# Patient Record
Sex: Female | Born: 1975 | Race: Black or African American | Hispanic: No | Marital: Married | Smoking: Never smoker
Health system: Southern US, Community
[De-identification: ages and names within clinical notes are randomized; demographics above are authoritative.]

## PROBLEM LIST (undated history)

## (undated) DIAGNOSIS — I671 Cerebral aneurysm, nonruptured: Secondary | ICD-10-CM

## (undated) DIAGNOSIS — J45909 Unspecified asthma, uncomplicated: Secondary | ICD-10-CM

## (undated) DIAGNOSIS — M1990 Unspecified osteoarthritis, unspecified site: Secondary | ICD-10-CM

## (undated) DIAGNOSIS — E785 Hyperlipidemia, unspecified: Secondary | ICD-10-CM

## (undated) DIAGNOSIS — M810 Age-related osteoporosis without current pathological fracture: Secondary | ICD-10-CM

## (undated) DIAGNOSIS — E559 Vitamin D deficiency, unspecified: Secondary | ICD-10-CM

## (undated) DIAGNOSIS — M2550 Pain in unspecified joint: Secondary | ICD-10-CM

## (undated) DIAGNOSIS — I639 Cerebral infarction, unspecified: Secondary | ICD-10-CM

## (undated) DIAGNOSIS — I10 Essential (primary) hypertension: Secondary | ICD-10-CM

## (undated) DIAGNOSIS — R0602 Shortness of breath: Secondary | ICD-10-CM

## (undated) HISTORY — PX: DILATION AND CURETTAGE OF UTERUS: SHX78

## (undated) HISTORY — DX: Shortness of breath: R06.02

## (undated) HISTORY — DX: Hyperlipidemia, unspecified: E78.5

## (undated) HISTORY — DX: Cerebral aneurysm, nonruptured: I67.1

## (undated) HISTORY — DX: Age-related osteoporosis without current pathological fracture: M81.0

## (undated) HISTORY — DX: Cerebral infarction, unspecified: I63.9

## (undated) HISTORY — DX: Pain in unspecified joint: M25.50

## (undated) HISTORY — DX: Vitamin D deficiency, unspecified: E55.9

## (undated) HISTORY — DX: Essential (primary) hypertension: I10

---

## 2016-01-15 DIAGNOSIS — J069 Acute upper respiratory infection, unspecified: Secondary | ICD-10-CM | POA: Diagnosis not present

## 2016-08-30 DIAGNOSIS — J189 Pneumonia, unspecified organism: Secondary | ICD-10-CM | POA: Diagnosis not present

## 2016-09-21 DIAGNOSIS — Z0000 Encounter for general adult medical examination without abnormal findings: Secondary | ICD-10-CM | POA: Diagnosis not present

## 2016-09-28 DIAGNOSIS — M25561 Pain in right knee: Secondary | ICD-10-CM | POA: Diagnosis not present

## 2016-09-28 DIAGNOSIS — M25562 Pain in left knee: Secondary | ICD-10-CM | POA: Diagnosis not present

## 2016-09-28 DIAGNOSIS — R635 Abnormal weight gain: Secondary | ICD-10-CM | POA: Diagnosis not present

## 2016-09-28 DIAGNOSIS — Z1389 Encounter for screening for other disorder: Secondary | ICD-10-CM | POA: Diagnosis not present

## 2016-09-28 DIAGNOSIS — Z0000 Encounter for general adult medical examination without abnormal findings: Secondary | ICD-10-CM | POA: Diagnosis not present

## 2016-09-28 DIAGNOSIS — J45998 Other asthma: Secondary | ICD-10-CM | POA: Diagnosis not present

## 2016-10-29 DIAGNOSIS — M25562 Pain in left knee: Secondary | ICD-10-CM | POA: Diagnosis not present

## 2016-10-29 DIAGNOSIS — M170 Bilateral primary osteoarthritis of knee: Secondary | ICD-10-CM | POA: Diagnosis not present

## 2016-10-29 DIAGNOSIS — M25561 Pain in right knee: Secondary | ICD-10-CM | POA: Diagnosis not present

## 2016-11-07 DIAGNOSIS — M170 Bilateral primary osteoarthritis of knee: Secondary | ICD-10-CM | POA: Diagnosis not present

## 2016-11-20 DIAGNOSIS — M170 Bilateral primary osteoarthritis of knee: Secondary | ICD-10-CM | POA: Diagnosis not present

## 2016-11-29 DIAGNOSIS — M170 Bilateral primary osteoarthritis of knee: Secondary | ICD-10-CM | POA: Diagnosis not present

## 2016-12-20 DIAGNOSIS — R635 Abnormal weight gain: Secondary | ICD-10-CM | POA: Diagnosis not present

## 2016-12-20 DIAGNOSIS — N951 Menopausal and female climacteric states: Secondary | ICD-10-CM | POA: Diagnosis not present

## 2016-12-26 DIAGNOSIS — E559 Vitamin D deficiency, unspecified: Secondary | ICD-10-CM | POA: Diagnosis not present

## 2017-01-02 DIAGNOSIS — E559 Vitamin D deficiency, unspecified: Secondary | ICD-10-CM | POA: Diagnosis not present

## 2017-01-14 DIAGNOSIS — I10 Essential (primary) hypertension: Secondary | ICD-10-CM | POA: Diagnosis not present

## 2017-01-14 DIAGNOSIS — E559 Vitamin D deficiency, unspecified: Secondary | ICD-10-CM | POA: Diagnosis not present

## 2017-01-28 DIAGNOSIS — E039 Hypothyroidism, unspecified: Secondary | ICD-10-CM | POA: Diagnosis not present

## 2017-01-28 DIAGNOSIS — I10 Essential (primary) hypertension: Secondary | ICD-10-CM | POA: Diagnosis not present

## 2017-01-28 DIAGNOSIS — E559 Vitamin D deficiency, unspecified: Secondary | ICD-10-CM | POA: Diagnosis not present

## 2017-02-04 DIAGNOSIS — E039 Hypothyroidism, unspecified: Secondary | ICD-10-CM | POA: Diagnosis not present

## 2017-02-04 DIAGNOSIS — E559 Vitamin D deficiency, unspecified: Secondary | ICD-10-CM | POA: Diagnosis not present

## 2017-02-12 DIAGNOSIS — E039 Hypothyroidism, unspecified: Secondary | ICD-10-CM | POA: Diagnosis not present

## 2017-02-12 DIAGNOSIS — E559 Vitamin D deficiency, unspecified: Secondary | ICD-10-CM | POA: Diagnosis not present

## 2017-02-18 DIAGNOSIS — E559 Vitamin D deficiency, unspecified: Secondary | ICD-10-CM | POA: Diagnosis not present

## 2017-02-18 DIAGNOSIS — E039 Hypothyroidism, unspecified: Secondary | ICD-10-CM | POA: Diagnosis not present

## 2017-02-18 DIAGNOSIS — I10 Essential (primary) hypertension: Secondary | ICD-10-CM | POA: Diagnosis not present

## 2017-02-25 DIAGNOSIS — E039 Hypothyroidism, unspecified: Secondary | ICD-10-CM | POA: Diagnosis not present

## 2017-02-25 DIAGNOSIS — I10 Essential (primary) hypertension: Secondary | ICD-10-CM | POA: Diagnosis not present

## 2017-03-04 DIAGNOSIS — I10 Essential (primary) hypertension: Secondary | ICD-10-CM | POA: Diagnosis not present

## 2017-03-04 DIAGNOSIS — E039 Hypothyroidism, unspecified: Secondary | ICD-10-CM | POA: Diagnosis not present

## 2017-03-11 DIAGNOSIS — R635 Abnormal weight gain: Secondary | ICD-10-CM | POA: Diagnosis not present

## 2017-03-11 DIAGNOSIS — E559 Vitamin D deficiency, unspecified: Secondary | ICD-10-CM | POA: Diagnosis not present

## 2017-03-11 DIAGNOSIS — E039 Hypothyroidism, unspecified: Secondary | ICD-10-CM | POA: Diagnosis not present

## 2017-03-19 DIAGNOSIS — E559 Vitamin D deficiency, unspecified: Secondary | ICD-10-CM | POA: Diagnosis not present

## 2017-03-19 DIAGNOSIS — E039 Hypothyroidism, unspecified: Secondary | ICD-10-CM | POA: Diagnosis not present

## 2017-03-25 DIAGNOSIS — E559 Vitamin D deficiency, unspecified: Secondary | ICD-10-CM | POA: Diagnosis not present

## 2017-03-25 DIAGNOSIS — E039 Hypothyroidism, unspecified: Secondary | ICD-10-CM | POA: Diagnosis not present

## 2017-04-01 DIAGNOSIS — E039 Hypothyroidism, unspecified: Secondary | ICD-10-CM | POA: Diagnosis not present

## 2017-04-15 DIAGNOSIS — I10 Essential (primary) hypertension: Secondary | ICD-10-CM | POA: Diagnosis not present

## 2017-04-15 DIAGNOSIS — E669 Obesity, unspecified: Secondary | ICD-10-CM | POA: Diagnosis not present

## 2017-04-15 DIAGNOSIS — E559 Vitamin D deficiency, unspecified: Secondary | ICD-10-CM | POA: Diagnosis not present

## 2017-04-15 DIAGNOSIS — E039 Hypothyroidism, unspecified: Secondary | ICD-10-CM | POA: Diagnosis not present

## 2017-04-29 DIAGNOSIS — E669 Obesity, unspecified: Secondary | ICD-10-CM | POA: Diagnosis not present

## 2017-04-29 DIAGNOSIS — E039 Hypothyroidism, unspecified: Secondary | ICD-10-CM | POA: Diagnosis not present

## 2017-04-30 DIAGNOSIS — F4323 Adjustment disorder with mixed anxiety and depressed mood: Secondary | ICD-10-CM | POA: Diagnosis not present

## 2017-05-13 DIAGNOSIS — E559 Vitamin D deficiency, unspecified: Secondary | ICD-10-CM | POA: Diagnosis not present

## 2017-05-13 DIAGNOSIS — E669 Obesity, unspecified: Secondary | ICD-10-CM | POA: Diagnosis not present

## 2017-05-13 DIAGNOSIS — I10 Essential (primary) hypertension: Secondary | ICD-10-CM | POA: Diagnosis not present

## 2017-05-27 DIAGNOSIS — E669 Obesity, unspecified: Secondary | ICD-10-CM | POA: Diagnosis not present

## 2017-05-27 DIAGNOSIS — E039 Hypothyroidism, unspecified: Secondary | ICD-10-CM | POA: Diagnosis not present

## 2017-06-26 DIAGNOSIS — E559 Vitamin D deficiency, unspecified: Secondary | ICD-10-CM | POA: Diagnosis not present

## 2017-06-26 DIAGNOSIS — E039 Hypothyroidism, unspecified: Secondary | ICD-10-CM | POA: Diagnosis not present

## 2017-06-26 DIAGNOSIS — E669 Obesity, unspecified: Secondary | ICD-10-CM | POA: Diagnosis not present

## 2017-07-12 DIAGNOSIS — E559 Vitamin D deficiency, unspecified: Secondary | ICD-10-CM | POA: Diagnosis not present

## 2017-07-12 DIAGNOSIS — E669 Obesity, unspecified: Secondary | ICD-10-CM | POA: Diagnosis not present

## 2017-07-23 DIAGNOSIS — E669 Obesity, unspecified: Secondary | ICD-10-CM | POA: Diagnosis not present

## 2017-07-23 DIAGNOSIS — E559 Vitamin D deficiency, unspecified: Secondary | ICD-10-CM | POA: Diagnosis not present

## 2017-08-06 DIAGNOSIS — Z713 Dietary counseling and surveillance: Secondary | ICD-10-CM | POA: Diagnosis not present

## 2017-08-06 DIAGNOSIS — E669 Obesity, unspecified: Secondary | ICD-10-CM | POA: Diagnosis not present

## 2017-09-02 DIAGNOSIS — E669 Obesity, unspecified: Secondary | ICD-10-CM | POA: Diagnosis not present

## 2017-09-02 DIAGNOSIS — E559 Vitamin D deficiency, unspecified: Secondary | ICD-10-CM | POA: Diagnosis not present

## 2017-09-02 DIAGNOSIS — E039 Hypothyroidism, unspecified: Secondary | ICD-10-CM | POA: Diagnosis not present

## 2017-09-02 DIAGNOSIS — I10 Essential (primary) hypertension: Secondary | ICD-10-CM | POA: Diagnosis not present

## 2017-09-16 DIAGNOSIS — E559 Vitamin D deficiency, unspecified: Secondary | ICD-10-CM | POA: Diagnosis not present

## 2017-09-16 DIAGNOSIS — I10 Essential (primary) hypertension: Secondary | ICD-10-CM | POA: Diagnosis not present

## 2017-09-30 DIAGNOSIS — E559 Vitamin D deficiency, unspecified: Secondary | ICD-10-CM | POA: Diagnosis not present

## 2017-10-14 DIAGNOSIS — R635 Abnormal weight gain: Secondary | ICD-10-CM | POA: Diagnosis not present

## 2017-10-16 DIAGNOSIS — Z01419 Encounter for gynecological examination (general) (routine) without abnormal findings: Secondary | ICD-10-CM | POA: Diagnosis not present

## 2017-10-16 DIAGNOSIS — Z6835 Body mass index (BMI) 35.0-35.9, adult: Secondary | ICD-10-CM | POA: Diagnosis not present

## 2017-10-16 DIAGNOSIS — Z1151 Encounter for screening for human papillomavirus (HPV): Secondary | ICD-10-CM | POA: Diagnosis not present

## 2017-10-28 DIAGNOSIS — I10 Essential (primary) hypertension: Secondary | ICD-10-CM | POA: Diagnosis not present

## 2017-11-11 DIAGNOSIS — R748 Abnormal levels of other serum enzymes: Secondary | ICD-10-CM | POA: Diagnosis not present

## 2017-11-28 DIAGNOSIS — R748 Abnormal levels of other serum enzymes: Secondary | ICD-10-CM | POA: Diagnosis not present

## 2017-11-28 DIAGNOSIS — E559 Vitamin D deficiency, unspecified: Secondary | ICD-10-CM | POA: Diagnosis not present

## 2017-11-28 DIAGNOSIS — I10 Essential (primary) hypertension: Secondary | ICD-10-CM | POA: Diagnosis not present

## 2017-12-11 DIAGNOSIS — R748 Abnormal levels of other serum enzymes: Secondary | ICD-10-CM | POA: Diagnosis not present

## 2017-12-11 DIAGNOSIS — E669 Obesity, unspecified: Secondary | ICD-10-CM | POA: Diagnosis not present

## 2017-12-11 DIAGNOSIS — E559 Vitamin D deficiency, unspecified: Secondary | ICD-10-CM | POA: Diagnosis not present

## 2017-12-24 DIAGNOSIS — E559 Vitamin D deficiency, unspecified: Secondary | ICD-10-CM | POA: Diagnosis not present

## 2017-12-24 DIAGNOSIS — R748 Abnormal levels of other serum enzymes: Secondary | ICD-10-CM | POA: Diagnosis not present

## 2018-01-15 DIAGNOSIS — E559 Vitamin D deficiency, unspecified: Secondary | ICD-10-CM | POA: Diagnosis not present

## 2018-01-21 DIAGNOSIS — R748 Abnormal levels of other serum enzymes: Secondary | ICD-10-CM | POA: Diagnosis not present

## 2018-01-21 DIAGNOSIS — E559 Vitamin D deficiency, unspecified: Secondary | ICD-10-CM | POA: Diagnosis not present

## 2018-02-06 ENCOUNTER — Emergency Department (HOSPITAL_COMMUNITY)
Admission: EM | Admit: 2018-02-06 | Discharge: 2018-02-07 | Disposition: A | Discharge status: Home / Self Care | Payer: BLUE CROSS/BLUE SHIELD | Attending: Emergency Medicine | Admitting: Emergency Medicine

## 2018-02-06 ENCOUNTER — Other Ambulatory Visit: Payer: Self-pay

## 2018-02-06 ENCOUNTER — Emergency Department (HOSPITAL_COMMUNITY): Payer: BLUE CROSS/BLUE SHIELD

## 2018-02-06 ENCOUNTER — Encounter (HOSPITAL_COMMUNITY): Payer: Self-pay | Admitting: *Deleted

## 2018-02-06 DIAGNOSIS — Z79899 Other long term (current) drug therapy: Secondary | ICD-10-CM | POA: Diagnosis not present

## 2018-02-06 DIAGNOSIS — R1032 Left lower quadrant pain: Secondary | ICD-10-CM | POA: Insufficient documentation

## 2018-02-06 DIAGNOSIS — R109 Unspecified abdominal pain: Secondary | ICD-10-CM | POA: Diagnosis not present

## 2018-02-06 LAB — URINALYSIS, ROUTINE W REFLEX MICROSCOPIC
BILIRUBIN URINE: NEGATIVE
GLUCOSE, UA: NEGATIVE mg/dL
HGB URINE DIPSTICK: NEGATIVE
Ketones, ur: NEGATIVE mg/dL
LEUKOCYTES UA: NEGATIVE
Nitrite: NEGATIVE
PH: 5 (ref 5.0–8.0)
Protein, ur: NEGATIVE mg/dL
SPECIFIC GRAVITY, URINE: 1.019 (ref 1.005–1.030)

## 2018-02-06 LAB — COMPREHENSIVE METABOLIC PANEL
ALT: 18 U/L (ref 0–44)
AST: 20 U/L (ref 15–41)
Albumin: 3.7 g/dL (ref 3.5–5.0)
Alkaline Phosphatase: 47 U/L (ref 38–126)
Anion gap: 9 (ref 5–15)
BUN: 19 mg/dL (ref 6–20)
CHLORIDE: 103 mmol/L (ref 98–111)
CO2: 26 mmol/L (ref 22–32)
Calcium: 9.2 mg/dL (ref 8.9–10.3)
Creatinine, Ser: 0.89 mg/dL (ref 0.44–1.00)
GFR calc non Af Amer: >60 mL/min (ref >60)
Glucose, Bld: 77 mg/dL (ref 70–99)
Potassium: 4.1 mmol/L (ref 3.5–5.1)
SODIUM: 138 mmol/L (ref 135–145)
Total Bilirubin: 0.7 mg/dL (ref 0.3–1.2)
Total Protein: 6.9 g/dL (ref 6.5–8.1)

## 2018-02-06 LAB — CBC WITH DIFFERENTIAL/PLATELET
Abs Immature Granulocytes: 0 10*3/uL (ref 0.0–0.1)
Basophils Absolute: 0 10*3/uL (ref 0.0–0.1)
Basophils Relative: 1 %
Eosinophils Absolute: 0.2 10*3/uL (ref 0.0–0.7)
Eosinophils Relative: 4 %
HEMATOCRIT: 36.1 % (ref 36.0–46.0)
HEMOGLOBIN: 11.4 g/dL — AB (ref 12.0–15.0)
IMMATURE GRANULOCYTES: 0 %
LYMPHS ABS: 2.4 10*3/uL (ref 0.7–4.0)
LYMPHS PCT: 40 %
MCH: 30.9 pg (ref 26.0–34.0)
MCHC: 31.6 g/dL (ref 30.0–36.0)
MCV: 97.8 fL (ref 78.0–100.0)
MONOS PCT: 11 %
Monocytes Absolute: 0.6 10*3/uL (ref 0.1–1.0)
NEUTROS PCT: 45 %
Neutro Abs: 2.7 10*3/uL (ref 1.7–7.7)
Platelets: 366 10*3/uL (ref 150–400)
RBC: 3.69 MIL/uL — ABNORMAL LOW (ref 3.87–5.11)
RDW: 12.4 % (ref 11.5–15.5)
WBC: 5.9 10*3/uL (ref 4.0–10.5)

## 2018-02-06 LAB — I-STAT BETA HCG BLOOD, ED (MC, WL, AP ONLY): I-stat hCG, quantitative: <5 m[IU]/mL (ref <5)

## 2018-02-06 MED ORDER — IBUPROFEN 800 MG PO TABS
800.0000 mg | ORAL_TABLET | Freq: Once | ORAL | Status: AC
  Administered 2018-02-07: 800 mg via ORAL
  Filled 2018-02-06: qty 1

## 2018-02-06 NOTE — ED Provider Notes (Signed)
MSE was initiated and I personally evaluated the patient and placed orders (if any) at  8:07 PM on February 06, 2018.  The patient appears stable so that the remainder of the MSE may be completed by another provider.  Patient placed in Quick Look pathway, seen and evaluated   Chief Complaint: abdominal pain  HPI:   42 year old female presents to ED for evaluation of 3-week history of intermittent left lower quadrant pain, left flank pain and left back pain.  Cannot recall any aggravating or alleviating factors related to the pain.  Reports mild improvement with Aleve but has not been taking this regularly.  Denies any dysuria, hematuria, history of kidney stones, vaginal discharge, abnormal vaginal bleeding, changes to bowel movements, nausea, vomiting or fever.  ROS: abdominal pain  Physical Exam:   Gen: No distress  Neuro: Awake and Alert  Skin: Warm    Focused Exam: Tenderness palpation of the left lower quadrant, left flank and left side of the back.  No rebound or guarding noted.   Initiation of care has begun. The patient has been counseled on the process, plan, and necessity for staying for the completion/evaluation, and the remainder of the medical screening examination    Brooks SailorsKhatri, Leeam Cedrone, PA-C 02/06/18 2008    Jacalyn LefevreHaviland, Julie, MD 02/06/18 2119

## 2018-02-06 NOTE — ED Provider Notes (Signed)
[facility] EMERGENCY DEPARTMENT Provider Note   CSN: [id number] Arrival date & time: 02/06/18  1957     History   Chief Complaint Chief Complaint  Patient presents with  . Flank Pain    HPI Katie Knight is a 42 y.o. female.  43 year old female with no significant past medical history presents to the emergency department for evaluation of 3 weeks of left lower quadrant pain.  Pain radiates towards the left flank and low back.  It is intermittent and relieved slightly with Aleve.  Patient does report heavy lifting at her job as well as while working out, but denies any specific injury inciting her pain.  Reports that she went to bend over to pick something up off the ground and had severe worsening of her discomfort.  This was a motivating factor for the patient to seek additional evaluation.  No specific worsening with eating.  She has had no fevers, nausea, vomiting, diarrhea, dysuria, hematuria, urinary frequency or urgency.  Also denies vaginal discharge, abnormal vaginal bleeding, history of abdominal surgeries.     History reviewed. No pertinent past medical history.  There are no active problems to display for this patient.   Past Surgical History:  Procedure Laterality Date  . DILATION AND CURETTAGE OF UTERUS       OB History   None      Home Medications    Prior to Admission medications   Medication Sig Start Date End Date Taking? Authorizing Provider  phentermine 37.5 MG capsule Take 37.5 mg by mouth daily. 01/21/18  Yes [provider]  PROAIR HFA 108 (90 Base) MCG/ACT inhaler Take 2 puffs by mouth every 4 (four) hours as needed for wheezing or shortness of breath.  01/30/18  Yes [provider]  polyethylene glycol powder (GLYCOLAX/MIRALAX) powder Take 17 g by mouth 2 (two) times daily. Until daily soft stools  OTC 02/07/18   Antony Madura, PA-C    Family History No family history on file.  Social History Social  History   Tobacco Use  . Smoking status: Never Smoker  . Smokeless tobacco: Never Used  Substance Use Topics  . Alcohol use: Yes  . Drug use: Never     Allergies   Patient has no known allergies.   Review of Systems Review of Systems Ten systems reviewed and are negative for acute change, except as noted in the HPI.    Physical Exam Updated Vital Signs BP 122/81   Pulse 66   Temp 98.8 F (37.1 C)   Resp 18   LMP 01/21/2018   SpO2 99%   Physical Exam  Constitutional: She is oriented to person, place, and time. She appears well-developed and well-nourished. No distress.  Nontoxic appearing and in NAD  HENT:  Head: Normocephalic and atraumatic.  Eyes: Conjunctivae and EOM are normal. No scleral icterus.  Neck: Normal range of motion.  Cardiovascular: Normal rate, regular rhythm and intact distal pulses.  Pulmonary/Chest: Effort normal. No stridor. No respiratory distress. She has no wheezes.  Respirations even and unlabored  Abdominal:  TTP in the left lower quadrant. Abdomen soft, nondistended. No peritoneal signs.  Musculoskeletal: Normal range of motion.  Neurological: She is alert and oriented to person, place, and time. She exhibits normal muscle tone. Coordination normal.  Skin: Skin is warm and dry. No rash noted. She is not diaphoretic. No erythema. No pallor.  Psychiatric: She has a normal mood and affect. Her behavior is normal.  Nursing note and  vitals reviewed.    ED Treatments / Results  Labs (all labs ordered are listed, but only abnormal results are displayed) Labs Reviewed  CBC WITH DIFFERENTIAL/PLATELET - Abnormal; Notable for the following components:      Result Value   RBC 3.69 (*)    Hemoglobin 11.4 (*)    All other components within normal limits  COMPREHENSIVE METABOLIC PANEL  URINALYSIS, ROUTINE W REFLEX MICROSCOPIC  I-STAT BETA HCG BLOOD, ED ([facility], [facility], [facility] ONLY)    EKG None  Radiology [place] Abdomen Complete  Result Date:  02/07/2018 CLINICAL DATA:  Left flank pain for 3 weeks. EXAM: ABDOMEN ULTRASOUND COMPLETE COMPARISON:  None. FINDINGS: Gallbladder: Gallbladder is contracted, limiting visualization. No acute abnormality is suggested. No discrete stones identified. Common bile duct: Diameter: 5.3 mm, normal Liver: Diffusely increased hepatic parenchymal echotexture likely representing fatty infiltration. No focal lesions identified. Portal vein is patent on color Doppler imaging with normal direction of blood flow towards the liver. IVC: Echogenic changes seen within the inferior vena cava. This could indicate thrombosis or slow flow. Pancreas: Not well visualized due to bowel gas. Spleen: Size and appearance within normal limits. Right Kidney: Length: 9.7 cm. Echogenicity within normal limits. No mass or hydronephrosis visualized. Left Kidney: Length: 9.9 cm. Echogenicity within normal limits. No mass or hydronephrosis visualized. Abdominal aorta: No aneurysm visualized. Other findings: None. IMPRESSION: 1. Contracted gallbladder, likely physiologic but limiting evaluation. No definitive abnormality identified. 2. Hepatomegaly with diffusely increased hepatic parenchymal echotexture likely representing fatty infiltration. 3. Nonspecific echogenic changes seen within the inferior vena cava possibly indicating thrombosis or slow flow. Does the patient have an IVC filter? Electronically Signed   By: Burman NievesWilliam  Stevens M.D.   On: 02/07/2018 00:51   Ct Venogram Abd/pel  Addendum Date: 02/07/2018   ADDENDUM REPORT: 02/07/2018 03:13 ADDENDUM: Small amount of free fluid in the pelvis. Electronically Signed   By: Jasmine PangKim  Fujinaga M.D.   On: 02/07/2018 03:13   Result Date: 02/07/2018 CLINICAL DATA:  Possible thrombus in the IVC on recent sonography EXAM: CT venogram abdomen and pelvis TECHNIQUE: Multidetector CT imaging of the abdomen and pelvis was performed using the standard protocol following bolus administration of intravenous  contrast. CONTRAST:  100mL OMNIPAQUE IOHEXOL 300 MG/ML  SOLN COMPARISON:  Ultrasound 02/06/2018 FINDINGS: Lower chest: Cardiac apex is on the left. Diffuse slightly nodular densities within the lung bases, right middle lobe and lingula. 4 mm pulmonary nodule in the anterior left lung base. Hepatobiliary: Transverse orientation of the liver. No focal hepatic abnormality. The gallbladder is not well identified. Pancreas: No inflammatory changes Spleen: Visible in the right upper quadrant with at least 2 small splenules adjacent to a dominant spleen. Adrenals/Urinary Tract: Adrenal glands are within normal limits. No hydronephrosis. Bladder normal Stomach/Bowel: Stomach is in the right upper quadrant. 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 No obstruction. No bowel wall thickening. Vascular/Lymphatic: Left-sided IVC. Inadequate opacification of the IVC to evaluate for thrombus. Nonaneurysmal aorta. No significantly enlarged lymph nodes. Reproductive: Uterus and bilateral adnexa are unremarkable. Other: Negative for free air or free fluid. Musculoskeletal: No acute or suspicious abnormality. IMPRESSION: 1. Nondiagnostic study for evaluation of the IVC, there is inadequate opacification of the IVC to evaluate for thrombus or filling defect. 2. 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 Stomach and spleen/splenules are in the right upper quadrant. 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 No evidence for an obstruction. 3. Multiple nodular foci within the bilateral lung bases, lingula and right middle lobe consistent with respiratory infection, possible atypical pneumonia. 4 mm left lung base nodule. Electronically Signed: By: Jasmine Pang M.D. On: 02/07/2018 02:44   US Pelvic Complete With Transvaginal  Result Date: 02/07/2018 CLINICAL DATA:  Left lower quadrant pain for 3 weeks EXAM:  TRANSABDOMINAL AND TRANSVAGINAL ULTRASOUND OF PELVIS TECHNIQUE: Both transabdominal and transvaginal ultrasound examinations of the pelvis were performed. Transabdominal technique was performed for global imaging of the pelvis including uterus, ovaries, adnexal regions, and pelvic cul-de-sac. It was necessary to proceed with endovaginal exam following the transabdominal exam to visualize the endometrium and ovaries. COMPARISON:  None FINDINGS: Uterus Measurements: 7.5 x 4.3 x 4.4 cm. No fibroids or other mass visualized. Endometrium Thickness: 9.4 mm.  No focal abnormality visualized. Right ovary Measurements: 2.9 x 1.4 x 2.4 cm. Normal appearance/no adnexal mass. Left ovary Measurements: 3.6 x 2.8 x 2.9 cm. Normal appearance/no adnexal mass. Other findings No abnormal free fluid. IMPRESSION: Normal pelvic ultrasound. Electronically Signed   By: Deatra Robinson M.D.   On: 02/07/2018 00:53    Procedures Procedures (including critical care time)  Medications Ordered in ED Medications  ibuprofen (ADVIL,MOTRIN) tablet 800 mg (800 mg Oral Given 02/07/18 0032)  iohexol (OMNIPAQUE) 300 MG/ML solution 100 mL (100 mLs Intravenous Contrast Given 02/07/18 0146)     Initial Impression / Assessment and Plan / ED Course  I have reviewed the triage vital signs and the nursing notes.  Pertinent labs & imaging results that were available during my care of the patient were reviewed by me and considered in my medical decision making (see chart for details).     42 year old female presents to the emergency department for evaluation of 3 weeks of intermittent, waxing and waning left lower quadrant abdominal pain.  She has had no fevers, nausea, vomiting, diarrhea.  Laboratory work-up reassuring.  No leukocytosis or electrolyte derangements.  Liver and kidney function preserved.  There is mild anemia, but no baseline for comparison.  Urinalysis without evidence of UTI.  No hematuria to suggest kidney stone.  Pregnancy  negative.  Ultrasound was obtained to evaluate for hydronephrosis to rule out a mobile stone as well as ovarian cyst or hemorrhagic cyst.  Chronicity of symptoms without infectious symptoms made diverticulitis, TOA, ovarian torsion unlikely.  Ultrasound with nonspecific echogenic changes within the IVC.  This was subsequently evaluated with CT scan.  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  She is noted to have large volume stool in the left upper and left lower quadrant which may be contributing to her discomfort.  No largely alarming features on CT study.  Discussion was had with my attending, Dr. Preston Fleeting, with decision to continue with outpatient follow-up.  Patient started on MiraLAX.  Told to continue use of ibuprofen for pain, which has provided symptomatic improvement since pain onset 3 weeks ago.  Return precautions discussed and provided. Patient discharged in stable condition with no unaddressed concerns.   Final Clinical Impressions(s) / ED Diagnoses   Final diagnoses:  LLQ pain    ED Discharge Orders        Ordered    polyethylene glycol powder (GLYCOLAX/MIRALAX) powder  2 times daily     02/07/18 0339       Antony Madura, PA-C 02/07/18 0426    Dione Booze, MD 02/07/18  0613  

## 2018-02-06 NOTE — ED Triage Notes (Signed)
Pt has been having LLQ pain with L sided flank and back pain intermittently for the past 3 weeks. Denies vag discharge or dysuria

## 2018-02-07 ENCOUNTER — Emergency Department (HOSPITAL_COMMUNITY): Payer: BLUE CROSS/BLUE SHIELD

## 2018-02-07 DIAGNOSIS — R1032 Left lower quadrant pain: Secondary | ICD-10-CM | POA: Diagnosis not present

## 2018-02-07 DIAGNOSIS — R109 Unspecified abdominal pain: Secondary | ICD-10-CM | POA: Diagnosis not present

## 2018-02-07 MED ORDER — POLYETHYLENE GLYCOL 3350 17 GM/SCOOP PO POWD: 17.0000 g | Freq: Two times a day (BID) | ORAL | 0 refills | Status: DC

## 2018-02-07 MED ORDER — IOHEXOL 300 MG/ML  SOLN
100.0000 mL | Freq: Once | INTRAMUSCULAR | Status: AC | PRN
  Administered 2018-02-07: 100 mL via INTRAVENOUS

## 2018-02-07 NOTE — ED Notes (Signed)
Pt. Returned from CT via stretcher. 

## 2018-02-07 NOTE — ED Notes (Signed)
Pt. To CT via stretcher. 

## 2018-02-10 DIAGNOSIS — R748 Abnormal levels of other serum enzymes: Secondary | ICD-10-CM | POA: Diagnosis not present

## 2018-02-13 DIAGNOSIS — R918 Other nonspecific abnormal finding of lung field: Secondary | ICD-10-CM | POA: Diagnosis not present

## 2018-02-13 DIAGNOSIS — K5900 Constipation, unspecified: Secondary | ICD-10-CM | POA: Diagnosis not present

## 2018-02-13 DIAGNOSIS — R58 Hemorrhage, not elsewhere classified: Secondary | ICD-10-CM | POA: Diagnosis not present

## 2018-02-13 DIAGNOSIS — J189 Pneumonia, unspecified organism: Secondary | ICD-10-CM | POA: Diagnosis not present

## 2018-02-26 ENCOUNTER — Other Ambulatory Visit: Payer: Self-pay | Admitting: Internal Medicine

## 2018-02-26 DIAGNOSIS — R911 Solitary pulmonary nodule: Secondary | ICD-10-CM

## 2018-03-03 ENCOUNTER — Ambulatory Visit
Admission: RE | Admit: 2018-03-03 | Discharge: 2018-03-03 | Disposition: A | Discharge status: Home / Self Care | Payer: BLUE CROSS/BLUE SHIELD | Source: Ambulatory Visit | Attending: Internal Medicine | Admitting: Internal Medicine

## 2018-03-03 DIAGNOSIS — R911 Solitary pulmonary nodule: Secondary | ICD-10-CM | POA: Diagnosis not present

## 2018-03-04 DIAGNOSIS — I10 Essential (primary) hypertension: Secondary | ICD-10-CM | POA: Diagnosis not present

## 2018-03-04 DIAGNOSIS — E669 Obesity, unspecified: Secondary | ICD-10-CM | POA: Diagnosis not present

## 2018-03-21 DIAGNOSIS — E663 Overweight: Secondary | ICD-10-CM | POA: Diagnosis not present

## 2018-03-21 DIAGNOSIS — R748 Abnormal levels of other serum enzymes: Secondary | ICD-10-CM | POA: Diagnosis not present

## 2018-03-21 DIAGNOSIS — E559 Vitamin D deficiency, unspecified: Secondary | ICD-10-CM | POA: Diagnosis not present

## 2018-03-21 DIAGNOSIS — E669 Obesity, unspecified: Secondary | ICD-10-CM | POA: Diagnosis not present

## 2018-03-31 DIAGNOSIS — R5383 Other fatigue: Secondary | ICD-10-CM | POA: Diagnosis not present

## 2018-03-31 DIAGNOSIS — Z0000 Encounter for general adult medical examination without abnormal findings: Secondary | ICD-10-CM | POA: Diagnosis not present

## 2018-04-09 ENCOUNTER — Other Ambulatory Visit (HOSPITAL_COMMUNITY): Payer: Self-pay | Admitting: Internal Medicine

## 2018-04-09 DIAGNOSIS — Z0000 Encounter for general adult medical examination without abnormal findings: Secondary | ICD-10-CM | POA: Diagnosis not present

## 2018-04-09 DIAGNOSIS — E559 Vitamin D deficiency, unspecified: Secondary | ICD-10-CM | POA: Diagnosis not present

## 2018-04-09 DIAGNOSIS — Q893 Situs inversus: Secondary | ICD-10-CM

## 2018-04-09 DIAGNOSIS — Z1389 Encounter for screening for other disorder: Secondary | ICD-10-CM | POA: Diagnosis not present

## 2018-04-09 DIAGNOSIS — Q899 Congenital malformation, unspecified: Secondary | ICD-10-CM | POA: Diagnosis not present

## 2018-04-09 DIAGNOSIS — D6489 Other specified anemias: Secondary | ICD-10-CM | POA: Diagnosis not present

## 2018-04-09 DIAGNOSIS — R918 Other nonspecific abnormal finding of lung field: Secondary | ICD-10-CM | POA: Diagnosis not present

## 2018-04-09 DIAGNOSIS — R748 Abnormal levels of other serum enzymes: Secondary | ICD-10-CM | POA: Diagnosis not present

## 2018-04-09 DIAGNOSIS — E663 Overweight: Secondary | ICD-10-CM | POA: Diagnosis not present

## 2018-04-09 DIAGNOSIS — K5909 Other constipation: Secondary | ICD-10-CM | POA: Diagnosis not present

## 2018-04-09 DIAGNOSIS — Z23 Encounter for immunization: Secondary | ICD-10-CM | POA: Diagnosis not present

## 2018-04-22 ENCOUNTER — Ambulatory Visit (HOSPITAL_COMMUNITY)
Admission: RE | Admit: 2018-04-22 | Discharge: 2018-04-22 | Disposition: A | Discharge status: Home / Self Care | Payer: BLUE CROSS/BLUE SHIELD | Source: Ambulatory Visit | Attending: Internal Medicine | Admitting: Internal Medicine

## 2018-04-22 DIAGNOSIS — I083 Combined rheumatic disorders of mitral, aortic and tricuspid valves: Secondary | ICD-10-CM | POA: Insufficient documentation

## 2018-04-22 DIAGNOSIS — Q893 Situs inversus: Secondary | ICD-10-CM | POA: Diagnosis not present

## 2018-04-22 NOTE — Progress Notes (Signed)
  Echocardiogram 2D Echocardiogram has been performed.  Delcie Roch 04/22/2018, 9:10 AM

## 2018-04-23 DIAGNOSIS — Z1231 Encounter for screening mammogram for malignant neoplasm of breast: Secondary | ICD-10-CM | POA: Diagnosis not present

## 2018-04-28 DIAGNOSIS — I10 Essential (primary) hypertension: Secondary | ICD-10-CM | POA: Diagnosis not present

## 2018-04-28 DIAGNOSIS — E663 Overweight: Secondary | ICD-10-CM | POA: Diagnosis not present

## 2018-04-29 DIAGNOSIS — N6313 Unspecified lump in the right breast, lower outer quadrant: Secondary | ICD-10-CM | POA: Diagnosis not present

## 2018-05-08 DIAGNOSIS — Q899 Congenital malformation, unspecified: Secondary | ICD-10-CM | POA: Diagnosis not present

## 2018-05-08 DIAGNOSIS — M25569 Pain in unspecified knee: Secondary | ICD-10-CM | POA: Diagnosis not present

## 2018-05-08 DIAGNOSIS — Z6830 Body mass index (BMI) 30.0-30.9, adult: Secondary | ICD-10-CM | POA: Diagnosis not present

## 2018-06-27 DIAGNOSIS — E039 Hypothyroidism, unspecified: Secondary | ICD-10-CM | POA: Diagnosis not present

## 2018-06-27 DIAGNOSIS — Z6829 Body mass index (BMI) 29.0-29.9, adult: Secondary | ICD-10-CM | POA: Diagnosis not present

## 2018-06-27 DIAGNOSIS — I10 Essential (primary) hypertension: Secondary | ICD-10-CM | POA: Diagnosis not present

## 2018-11-04 DIAGNOSIS — R635 Abnormal weight gain: Secondary | ICD-10-CM | POA: Diagnosis not present

## 2018-11-04 DIAGNOSIS — E039 Hypothyroidism, unspecified: Secondary | ICD-10-CM | POA: Diagnosis not present

## 2018-11-04 DIAGNOSIS — E559 Vitamin D deficiency, unspecified: Secondary | ICD-10-CM | POA: Diagnosis not present

## 2018-11-04 DIAGNOSIS — I10 Essential (primary) hypertension: Secondary | ICD-10-CM | POA: Diagnosis not present

## 2018-11-04 DIAGNOSIS — E669 Obesity, unspecified: Secondary | ICD-10-CM | POA: Diagnosis not present

## 2018-11-04 DIAGNOSIS — R748 Abnormal levels of other serum enzymes: Secondary | ICD-10-CM | POA: Diagnosis not present

## 2018-11-20 DIAGNOSIS — Z6828 Body mass index (BMI) 28.0-28.9, adult: Secondary | ICD-10-CM | POA: Diagnosis not present

## 2018-11-20 DIAGNOSIS — R748 Abnormal levels of other serum enzymes: Secondary | ICD-10-CM | POA: Diagnosis not present

## 2019-04-09 DIAGNOSIS — R5383 Other fatigue: Secondary | ICD-10-CM | POA: Diagnosis not present

## 2019-04-09 DIAGNOSIS — Z0000 Encounter for general adult medical examination without abnormal findings: Secondary | ICD-10-CM | POA: Diagnosis not present

## 2019-04-17 DIAGNOSIS — R5383 Other fatigue: Secondary | ICD-10-CM | POA: Diagnosis not present

## 2019-04-17 DIAGNOSIS — Z0000 Encounter for general adult medical examination without abnormal findings: Secondary | ICD-10-CM | POA: Diagnosis not present

## 2019-04-17 DIAGNOSIS — Z1331 Encounter for screening for depression: Secondary | ICD-10-CM | POA: Diagnosis not present

## 2019-04-17 DIAGNOSIS — D649 Anemia, unspecified: Secondary | ICD-10-CM | POA: Diagnosis not present

## 2019-04-17 DIAGNOSIS — E663 Overweight: Secondary | ICD-10-CM | POA: Diagnosis not present

## 2019-04-17 DIAGNOSIS — R918 Other nonspecific abnormal finding of lung field: Secondary | ICD-10-CM | POA: Diagnosis not present

## 2019-04-17 DIAGNOSIS — J45909 Unspecified asthma, uncomplicated: Secondary | ICD-10-CM | POA: Diagnosis not present

## 2019-05-20 DIAGNOSIS — E039 Hypothyroidism, unspecified: Secondary | ICD-10-CM | POA: Diagnosis not present

## 2019-05-20 DIAGNOSIS — R635 Abnormal weight gain: Secondary | ICD-10-CM | POA: Diagnosis not present

## 2019-05-20 DIAGNOSIS — Z6832 Body mass index (BMI) 32.0-32.9, adult: Secondary | ICD-10-CM | POA: Diagnosis not present

## 2019-05-20 DIAGNOSIS — E559 Vitamin D deficiency, unspecified: Secondary | ICD-10-CM | POA: Diagnosis not present

## 2019-05-26 DIAGNOSIS — Z1231 Encounter for screening mammogram for malignant neoplasm of breast: Secondary | ICD-10-CM | POA: Diagnosis not present

## 2019-05-26 DIAGNOSIS — Z01419 Encounter for gynecological examination (general) (routine) without abnormal findings: Secondary | ICD-10-CM | POA: Diagnosis not present

## 2019-05-26 DIAGNOSIS — Z6833 Body mass index (BMI) 33.0-33.9, adult: Secondary | ICD-10-CM | POA: Diagnosis not present

## 2019-05-29 DIAGNOSIS — E559 Vitamin D deficiency, unspecified: Secondary | ICD-10-CM | POA: Diagnosis not present

## 2019-05-29 DIAGNOSIS — Z6831 Body mass index (BMI) 31.0-31.9, adult: Secondary | ICD-10-CM | POA: Diagnosis not present

## 2019-06-02 IMAGING — US US PELVIS COMPLETE TRANSABD/TRANSVAG
1 series · 14 of 25 positions shown · non-contrast
Comparison: None

CLINICAL DATA: Left lower quadrant pain for 3 weeks

EXAM:
TRANSABDOMINAL AND TRANSVAGINAL ULTRASOUND OF PELVIS
TECHNIQUE: Both transabdominal and transvaginal ultrasound examinations of the
pelvis were performed. Transabdominal technique was performed for
global imaging of the pelvis including uterus, ovaries, adnexal
regions, and pelvic cul-de-sac. It was necessary to proceed with
endovaginal exam following the transabdominal exam to visualize the
endometrium and ovaries.

[Series 1: us pelvis complete transabd/transvag · 0.21mm/px · 65 acquisitions, 14 frames shown]
[im 1/65]
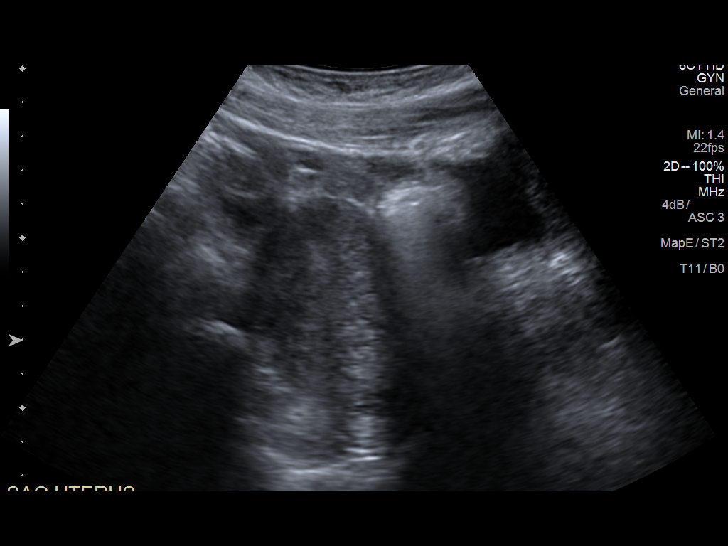
[im 6/65]
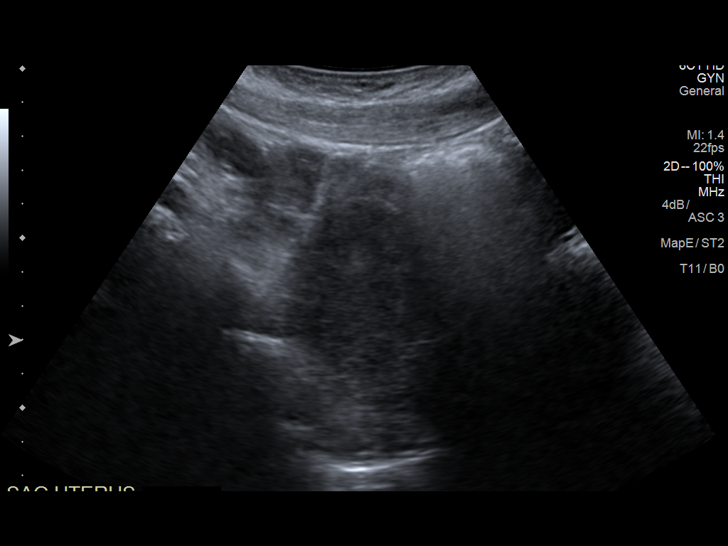
[im 11/65]
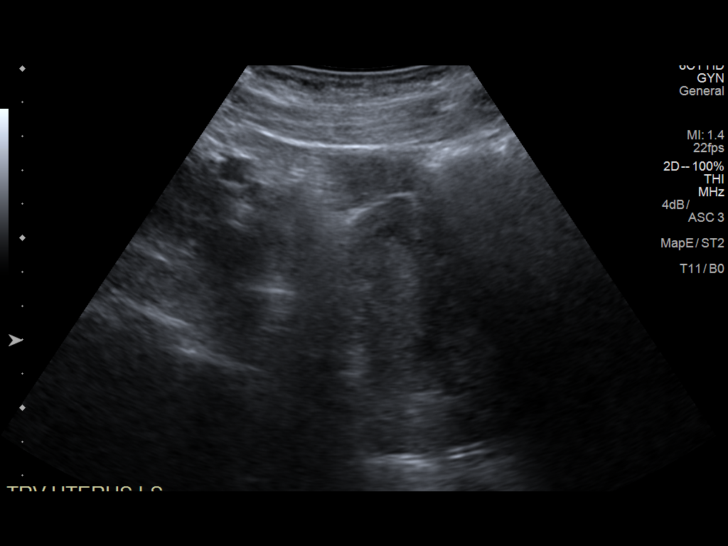
[im 17/65]
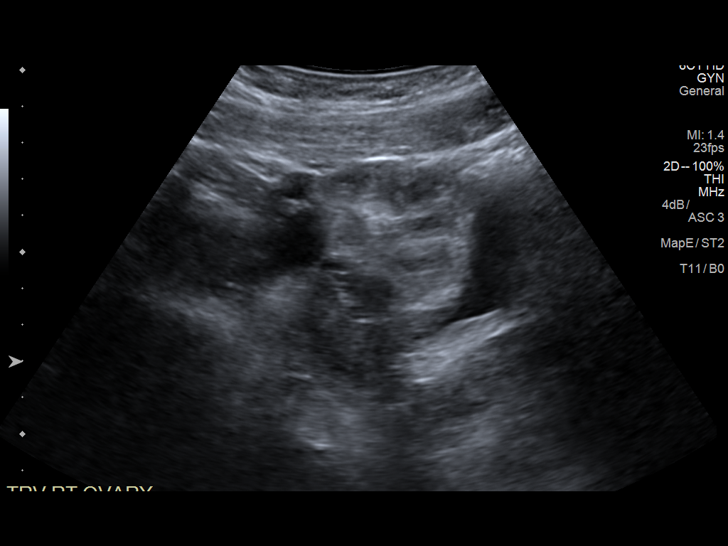
[im 22/65]
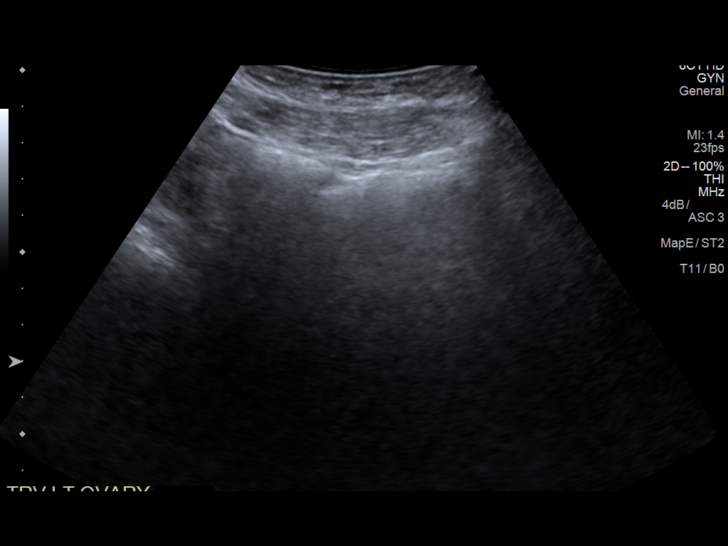
[im 25/65]
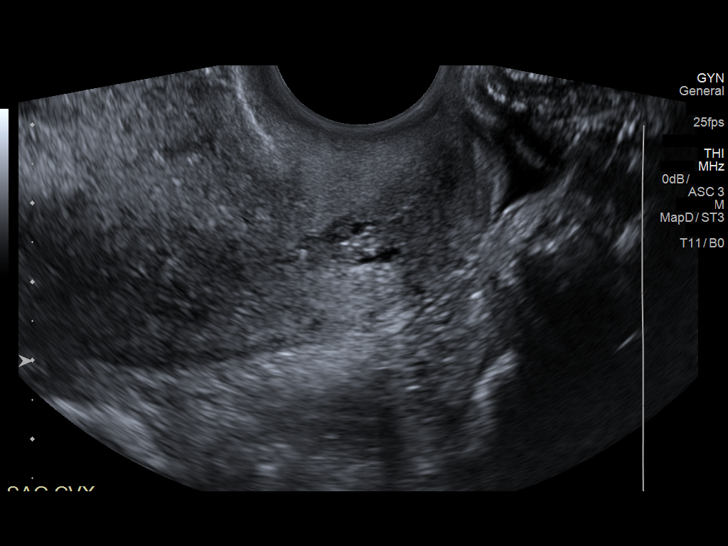
[im 30/65]
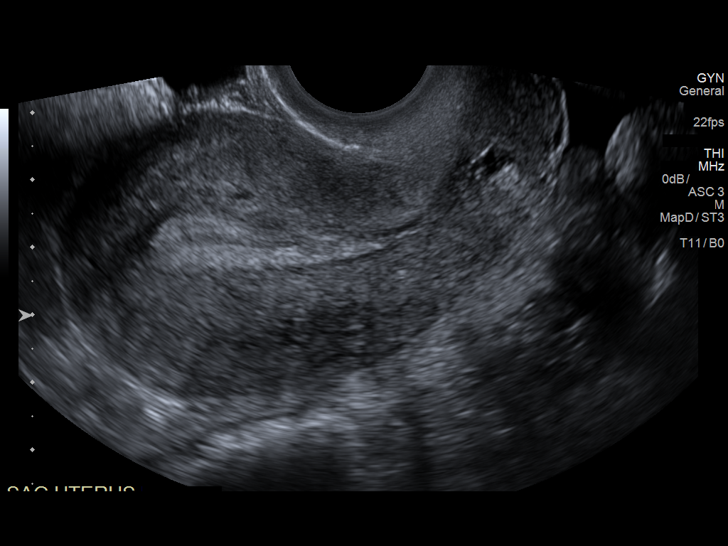
[im 35/65]
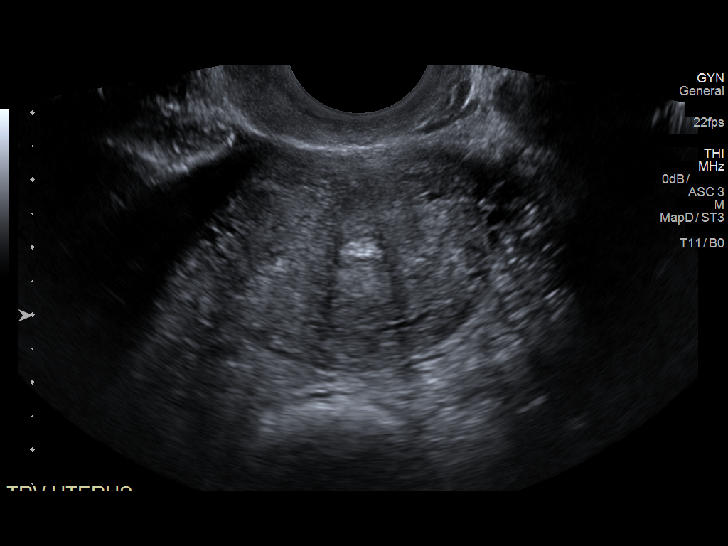
[im 41/65]
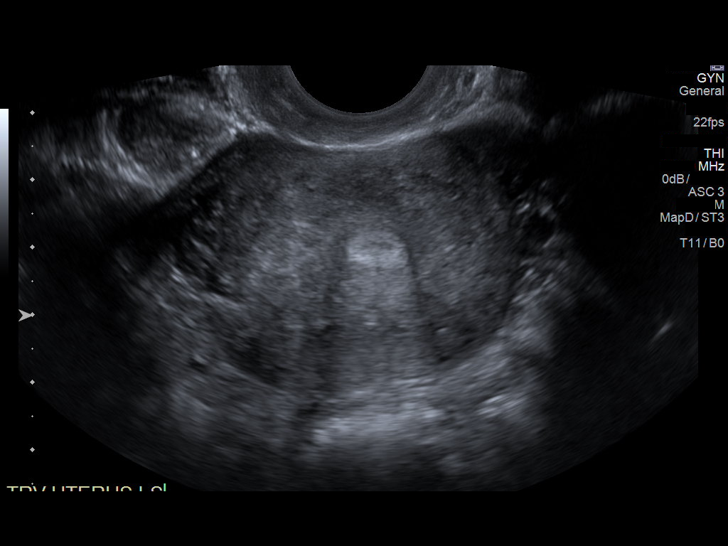
[im 43/65]
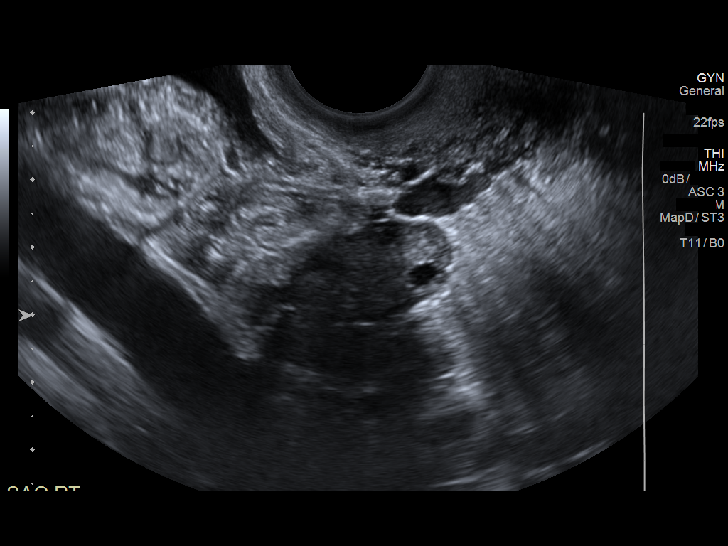
[im 49/65]
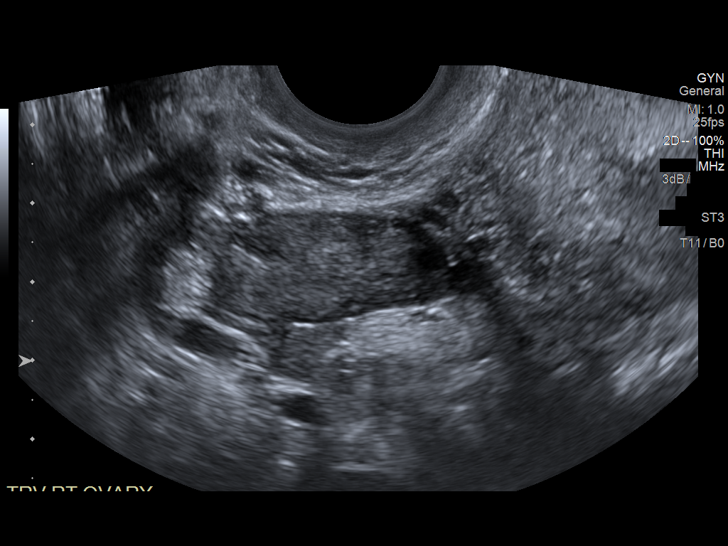
[im 54/65]
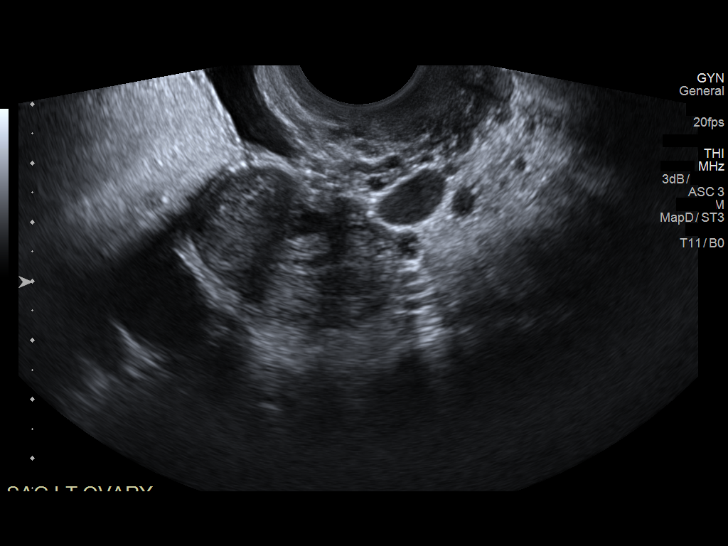
[im 59/65]
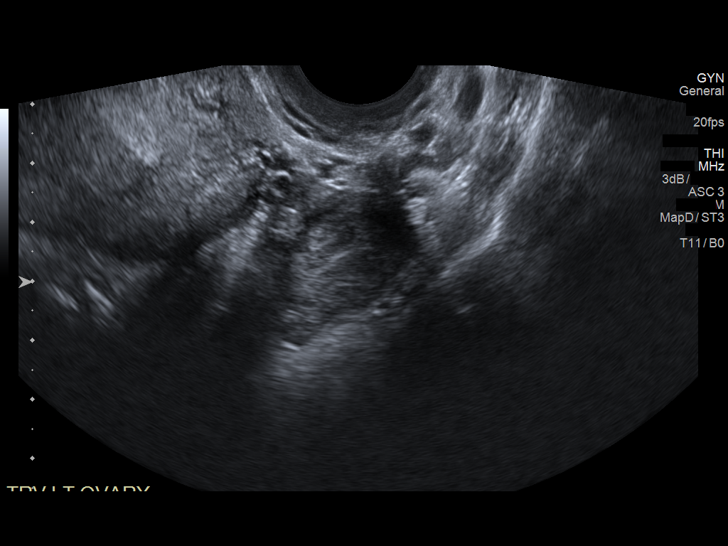
[im 65/65]
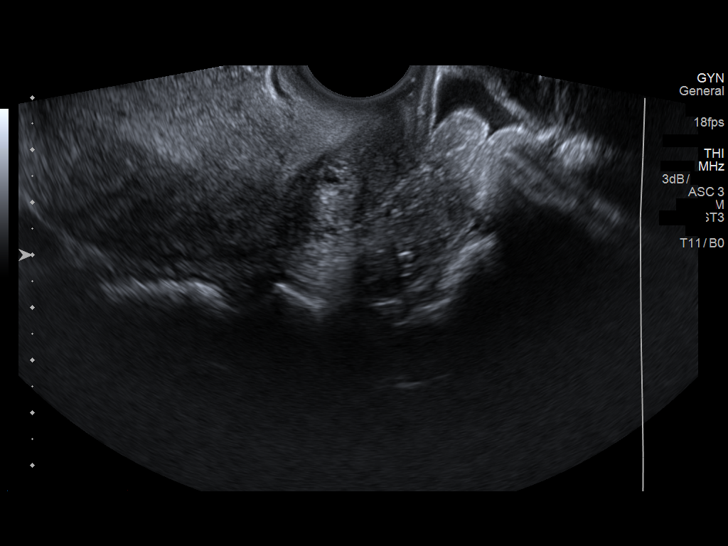

[14 of 25 positions shown; findings below may reference images not displayed]

FINDINGS: Uterus

Measurements: 7.5 x 4.3 x 4.4 cm. No fibroids or other mass
visualized.

Endometrium

Thickness: 9.4 mm.  No focal abnormality visualized.

Right ovary

Measurements: 2.9 x 1.4 x 2.4 cm. Normal appearance/no adnexal mass.

Left ovary

Measurements: 3.6 x 2.8 x 2.9 cm. Normal appearance/no adnexal mass.

Other findings

No abnormal free fluid.
IMPRESSION: Normal pelvic ultrasound.

## 2019-06-05 DIAGNOSIS — Z6831 Body mass index (BMI) 31.0-31.9, adult: Secondary | ICD-10-CM | POA: Diagnosis not present

## 2019-06-05 DIAGNOSIS — E559 Vitamin D deficiency, unspecified: Secondary | ICD-10-CM | POA: Diagnosis not present

## 2019-06-10 DIAGNOSIS — N6313 Unspecified lump in the right breast, lower outer quadrant: Secondary | ICD-10-CM | POA: Diagnosis not present

## 2020-05-09 IMAGING — CT CT CHEST W/O CM
2 of 4 series · 11 of 36 positions shown, 13 images · non-contrast
Comparison: Abdominal CT of 02/07/2018

CLINICAL DATA: Follow-up of pulmonary nodule.  Situs inversus.

EXAM:
CT CHEST WITHOUT CONTRAST
TECHNIQUE: Multidetector CT imaging of the chest was performed following the
standard protocol without IV contrast.

[Series 2: chest 2.00 br40 s3 ax · axial · 0.48mm/px · z∈[+1548,+1806]mm · 8 of 153 slices shown, 10 images]
[im 12/153  mediastinal]
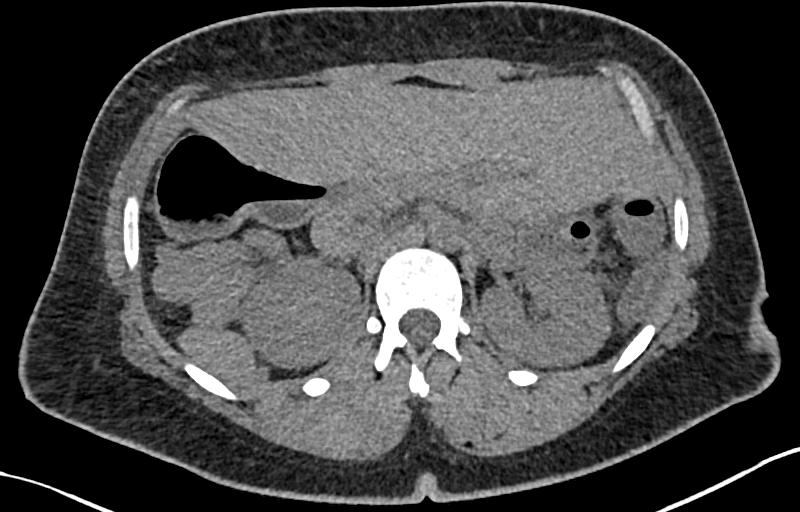
[im 12/153  lung]
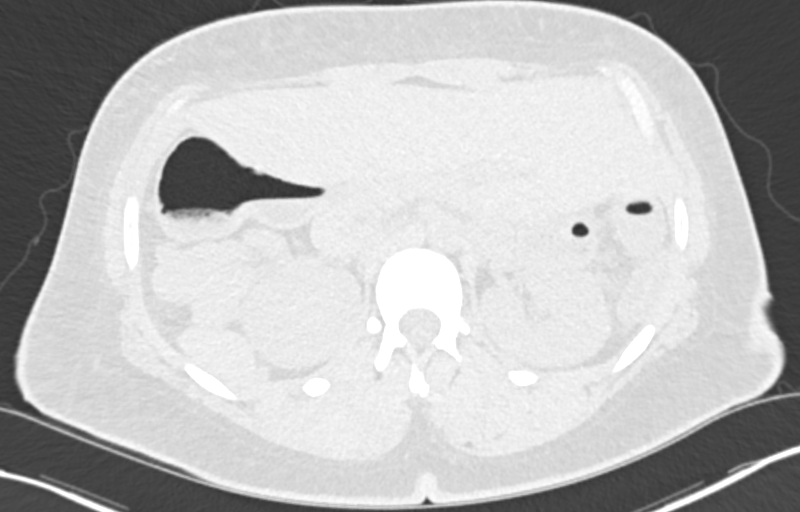
[im 36/153  lung]
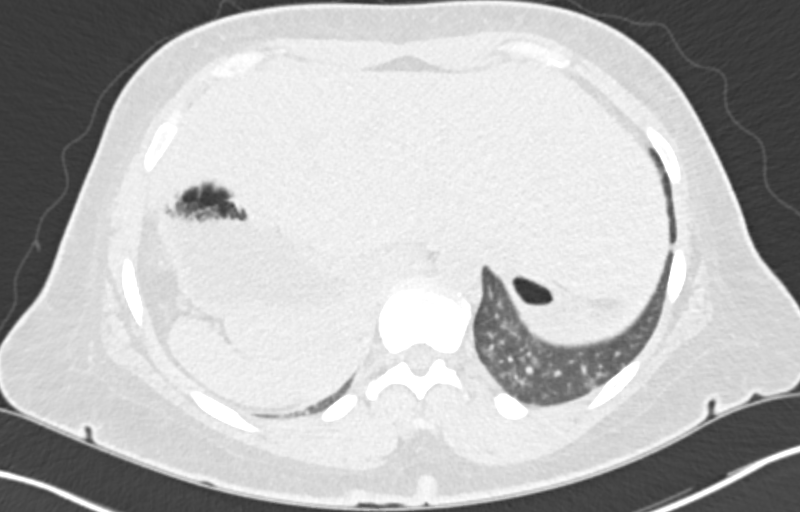
[im 47/153  lung]
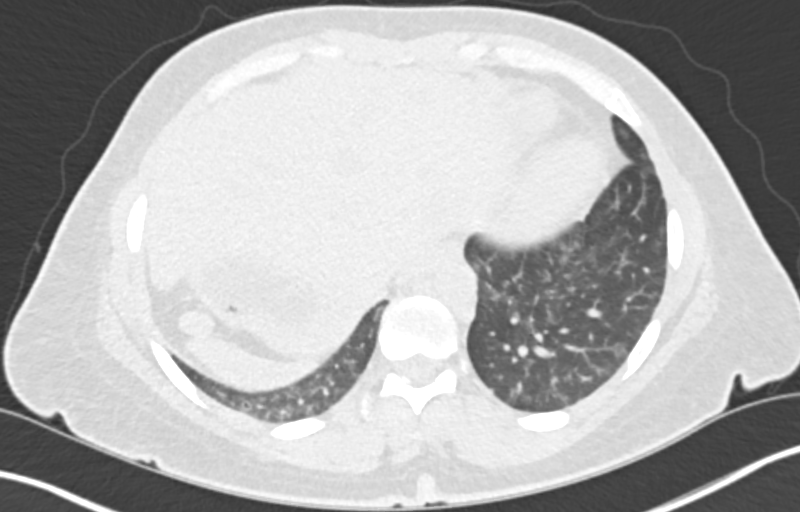
[im 71/153  lung]
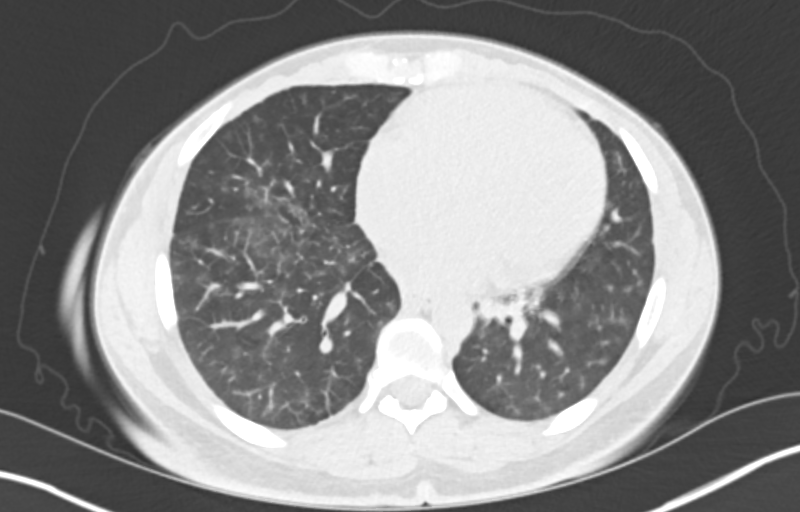
[im 82/153  mediastinal]
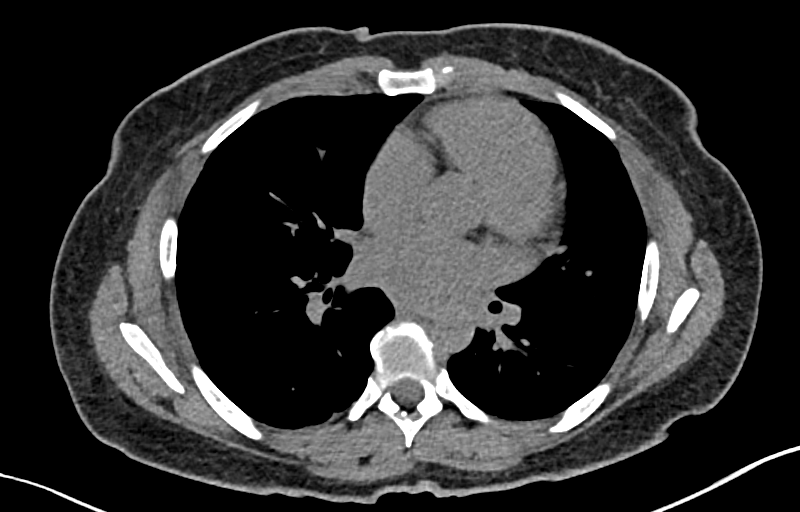
[im 82/153  lung]
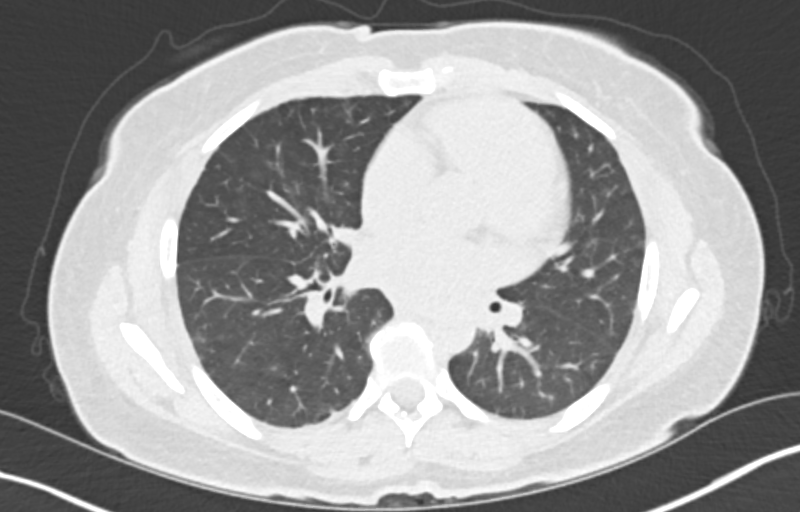
[im 106/153  lung]
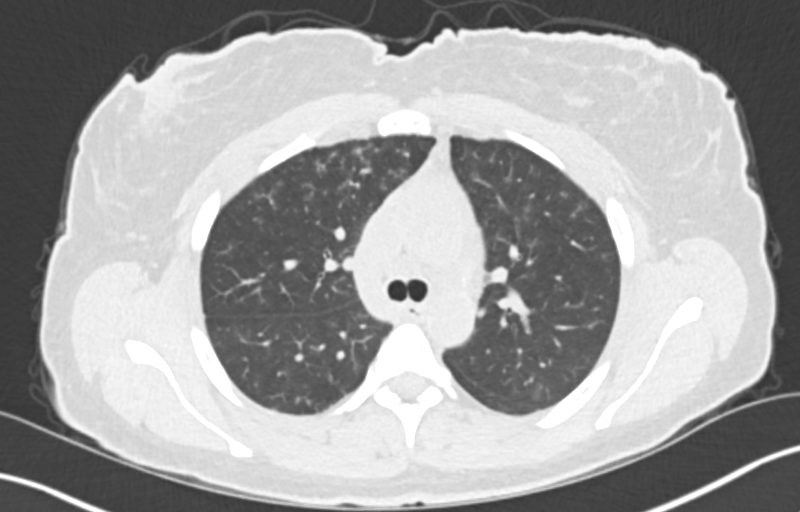
[im 117/153  lung]
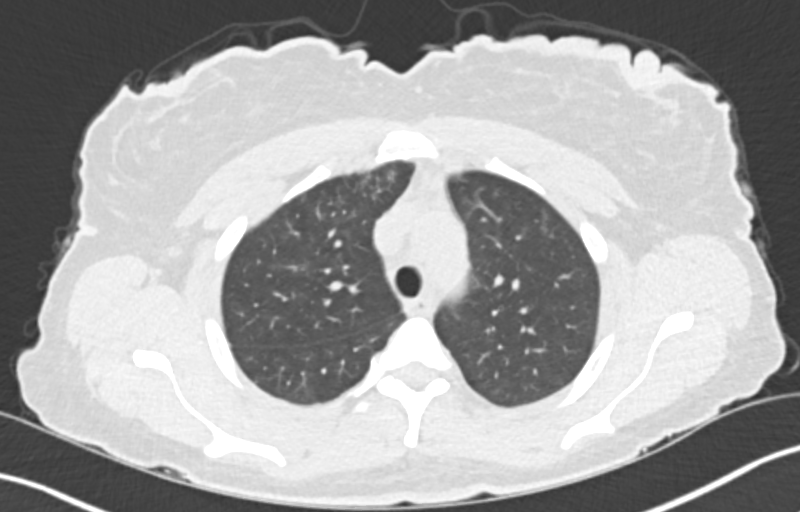
[im 141/153  lung]
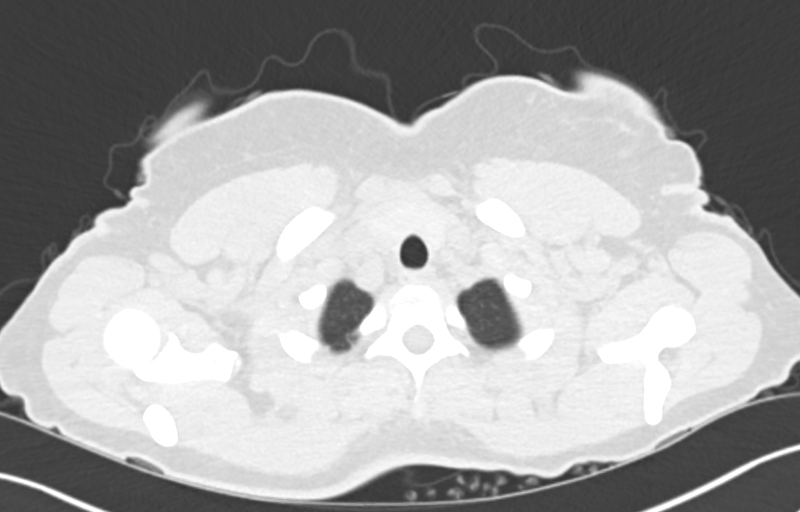

[Series 4: chest 2.00 br40 s3 cor · coronal · 0.60mm/px · 3 of 123 slices shown]
[im 25/123  lung]
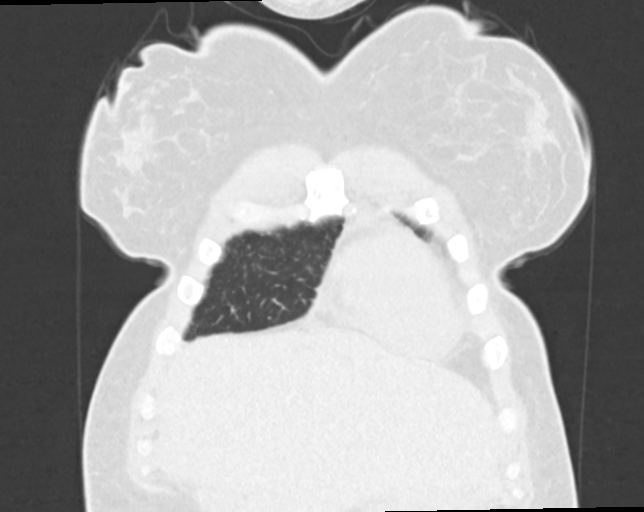
[im 49/123  lung]
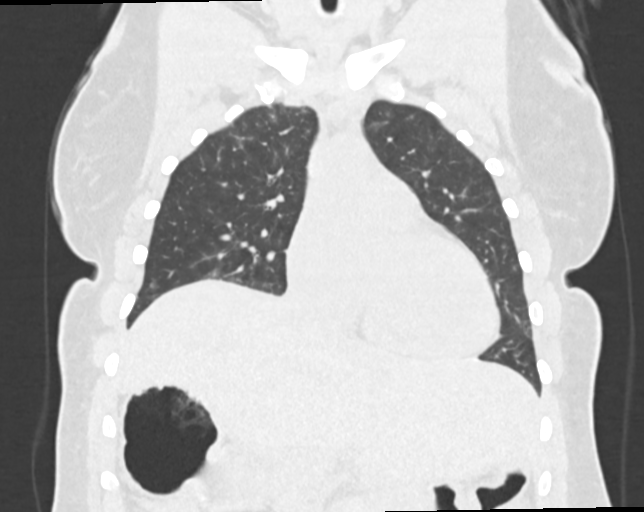
[im 74/123  lung]
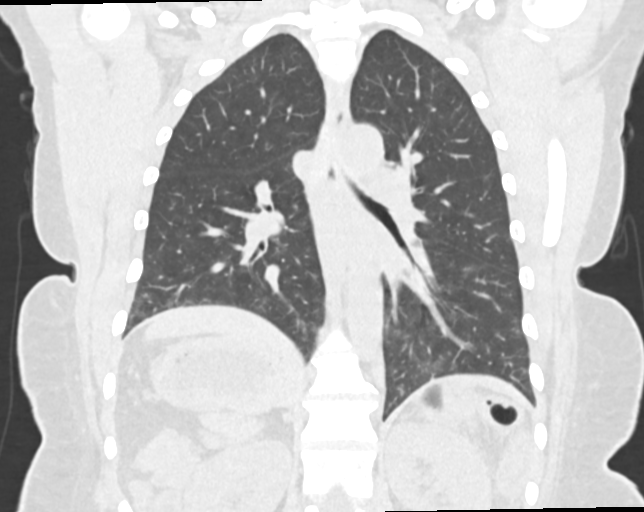

[11 of 36 positions shown; findings below may reference images not displayed]

FINDINGS: Cardiovascular: Normal appearance of the aorta and great vessels. No
dextrocardia. Borderline cardiomegaly, without pericardial effusion.

Mediastinum/Nodes: No mediastinal or definite hilar adenopathy,
given limitations of unenhanced CT.

Lungs/Pleura: No pleural fluid. Nodularity along the left major
fissure. A subpleural left lower lobe pulmonary nodule is unchanged
at 4 mm on 91/3.

Bilateral, slightly basilar predominant peribronchovascular
ground-glass nodularity. Where imaged on the prior, this is felt to
be similar.

Upper Abdomen: Heterotaxy syndrome again identified. Right-sided
position of the stomach and spleen. No acute superimposed process.
Normal adrenal glands and kidneys.

Musculoskeletal: No acute osseous abnormality.
IMPRESSION: 1. Redemonstration of peribronchovascular ground-glass nodularity.
Favor residual infection, including atypical etiologies.
2. Left lower lobe 4 mm nodule is unchanged over three weeks and
most likely a subpleural lymph node. No follow-up needed if patient
is low-risk. Non-contrast chest CT can be considered in 12 months if
patient is high-risk. This recommendation follows the consensus
statement: Guidelines for Management of Incidental Pulmonary Nodules
Detected on CT Images: From the [HOSPITAL] 6153; Radiology
6153; [DATE].
3. Heterotaxy syndrome, as before.
4. Subpleural

## 2020-07-05 DIAGNOSIS — J029 Acute pharyngitis, unspecified: Secondary | ICD-10-CM | POA: Diagnosis not present

## 2020-07-05 DIAGNOSIS — R52 Pain, unspecified: Secondary | ICD-10-CM | POA: Diagnosis not present

## 2020-07-05 DIAGNOSIS — U071 COVID-19: Secondary | ICD-10-CM | POA: Diagnosis not present

## 2020-07-20 DIAGNOSIS — J189 Pneumonia, unspecified organism: Secondary | ICD-10-CM | POA: Diagnosis not present

## 2020-07-20 DIAGNOSIS — R059 Cough, unspecified: Secondary | ICD-10-CM | POA: Diagnosis not present

## 2020-11-07 DIAGNOSIS — E663 Overweight: Secondary | ICD-10-CM | POA: Diagnosis not present

## 2020-11-07 DIAGNOSIS — Z0000 Encounter for general adult medical examination without abnormal findings: Secondary | ICD-10-CM | POA: Diagnosis not present

## 2020-11-16 DIAGNOSIS — Z0000 Encounter for general adult medical examination without abnormal findings: Secondary | ICD-10-CM | POA: Diagnosis not present

## 2020-11-16 DIAGNOSIS — Z1339 Encounter for screening examination for other mental health and behavioral disorders: Secondary | ICD-10-CM | POA: Diagnosis not present

## 2020-11-16 DIAGNOSIS — J45998 Other asthma: Secondary | ICD-10-CM | POA: Diagnosis not present

## 2020-11-16 DIAGNOSIS — Z1331 Encounter for screening for depression: Secondary | ICD-10-CM | POA: Diagnosis not present

## 2021-02-08 DIAGNOSIS — Z20822 Contact with and (suspected) exposure to covid-19: Secondary | ICD-10-CM | POA: Diagnosis not present

## 2021-03-22 DIAGNOSIS — M5416 Radiculopathy, lumbar region: Secondary | ICD-10-CM | POA: Diagnosis not present

## 2021-03-22 DIAGNOSIS — S39012A Strain of muscle, fascia and tendon of lower back, initial encounter: Secondary | ICD-10-CM | POA: Diagnosis not present

## 2021-05-16 DIAGNOSIS — Z20822 Contact with and (suspected) exposure to covid-19: Secondary | ICD-10-CM | POA: Diagnosis not present

## 2021-05-16 DIAGNOSIS — R0981 Nasal congestion: Secondary | ICD-10-CM | POA: Diagnosis not present

## 2021-05-16 DIAGNOSIS — R051 Acute cough: Secondary | ICD-10-CM | POA: Diagnosis not present

## 2021-05-16 DIAGNOSIS — J029 Acute pharyngitis, unspecified: Secondary | ICD-10-CM | POA: Diagnosis not present

## 2021-05-22 DIAGNOSIS — U071 COVID-19: Secondary | ICD-10-CM | POA: Diagnosis not present

## 2021-05-22 DIAGNOSIS — R051 Acute cough: Secondary | ICD-10-CM | POA: Diagnosis not present

## 2021-10-23 DIAGNOSIS — Z1231 Encounter for screening mammogram for malignant neoplasm of breast: Secondary | ICD-10-CM | POA: Diagnosis not present

## 2021-11-29 DIAGNOSIS — Z0000 Encounter for general adult medical examination without abnormal findings: Secondary | ICD-10-CM | POA: Diagnosis not present

## 2021-11-29 DIAGNOSIS — R7989 Other specified abnormal findings of blood chemistry: Secondary | ICD-10-CM | POA: Diagnosis not present

## 2021-12-04 DIAGNOSIS — Z0000 Encounter for general adult medical examination without abnormal findings: Secondary | ICD-10-CM | POA: Diagnosis not present

## 2021-12-04 DIAGNOSIS — Z1339 Encounter for screening examination for other mental health and behavioral disorders: Secondary | ICD-10-CM | POA: Diagnosis not present

## 2021-12-04 DIAGNOSIS — J45998 Other asthma: Secondary | ICD-10-CM | POA: Diagnosis not present

## 2021-12-04 DIAGNOSIS — Z1331 Encounter for screening for depression: Secondary | ICD-10-CM | POA: Diagnosis not present

## 2021-12-04 DIAGNOSIS — Z23 Encounter for immunization: Secondary | ICD-10-CM | POA: Diagnosis not present

## 2021-12-21 NOTE — Progress Notes (Signed)
Cardiology Office Note:   Date:  12/22/2021  NAME:  Katie Knight    MRN: [id number] DOB:  June 30, 1976   PCP:  Katie Penna, MD  Cardiologist:  None  Electrophysiologist:  None   Referring MD: Katie Penna, MD   Chief Complaint  Patient presents with   Chest Pain   History of Present Illness:   Katie Knight is a 46 y.o. female with a hx of asthma, obesity who is being seen today for the evaluation of chest pain at the request of Katie Penna, MD. she reports for the past 3 to 4 months has had intermittent sharp central chest pain.  Symptoms can occur 3-4 times per week.  She reports no identifiable triggers.  She reports symptoms can last minutes and resolve without intervention.  She reports that food may worsen her symptoms.  She reports no positional change.  Exertion does not seem to worsen it.  She also reports tightness in her chest and shortness of breath.  She does have a history of asthma and needs a refill on her albuterol inhaler.  She presents with her wife.  Apparently food may trigger some of this.  She also reports stress.  They have 3 children.  She works at the [employer].  I do question any possible chemical exposure.  Her EKG is normal.  She has never had a heart attack or stroke.  Cholesterol level within limits.  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  Echocardiogram in 2019 showed no dextrocardia.  Everything was unremarkable.  She does not exercise routinely.  She reports she does heavy activity at work.  Recent hemoglobin 12.2.  Creatinine 0.8.  TSH 0.93.  Lungs are clear today.  CV exam is normal.  Symptoms are not palpable.  Blood pressure today in office is normal.  No strong family history of heart disease.  High blood pressure does run in the family.  She is not diabetic.  Symptoms appear to be rather atypical for cardiac etiology.  T chol 134, HDL 34, LDL 91, TG 46  Past Medical History: Past Medical History:   Diagnosis Date   Hyperlipidemia     Past Surgical History: Past Surgical History:  Procedure Laterality Date   DILATION AND CURETTAGE OF UTERUS      Current Medications: Current Meds  Medication Sig   LINZESS 145 MCG CAPS capsule Take 145 mcg by mouth daily.   phentermine 37.5 MG capsule Take 37.5 mg by mouth daily.   PROAIR HFA 108 (90 Base) MCG/ACT inhaler Inhale 2 puffs into the lungs every 4 (four) hours as needed for wheezing or shortness of breath.   [DISCONTINUED] PROAIR HFA 108 (90 Base) MCG/ACT inhaler Take 2 puffs by mouth every 4 (four) hours as needed for wheezing or shortness of breath.      Allergies:    Patient has no known allergies.   Social History: Social History   Socioeconomic History   Marital status: Married    Spouse name: Not on file   Number of children: 3   Years of education: Not on file   Highest education level: Not on file  Occupational History   Occupation: [employer]  Tobacco Use   Smoking status: Never   Smokeless tobacco: Never  Substance and Sexual Activity   Alcohol use: Yes   Drug use: Never   Sexual activity: Not on file  Other Topics Concern   Not on file  Social History Narrative  Not on file   Social Determinants of Health   Financial Resource Strain: Not on file  Food Insecurity: Not on file  Transportation Needs: Not on file  Physical Activity: Not on file  Stress: Not on file  Social Connections: Not on file     Family History: The patient's family history includes Hypertension in her father.  ROS:   All other ROS reviewed and negative. Pertinent positives noted in the HPI.     EKGs/Labs/Other Studies Reviewed:   The following studies were personally reviewed by me today:  EKG:  EKG is ordered today.  The ekg ordered today demonstrates normal sinus rhythm heart rate 99, no acute ischemic changes or evidence of infarction, and was personally reviewed by me.   Recent Labs: No results found for  requested labs within last 365 days.   Recent Lipid Panel No results found for: "CHOL", "TRIG", "HDL", "CHOLHDL", "VLDL", "LDLCALC", "LDLDIRECT"  Physical Exam:   VS:  BP 128/84 (BP Location: Right Arm, Patient Position: Sitting, Cuff Size: Large)   Pulse 99   Ht 5\' 5"  (1.651 m)   Wt 238 lb (108 kg)   LMP  (Within Months)   SpO2 98%   BMI 39.61 kg/m    Wt Readings from Last 3 Encounters:  12/22/21 238 lb (108 kg)    General: Well nourished, well developed, in no acute distress Head: Atraumatic, normal size  Eyes: PEERLA, EOMI  Neck: Supple, no JVD Endocrine: No thryomegaly Cardiac: Normal S1, S2; RRR; no murmurs, rubs, or gallops Lungs: Clear to auscultation bilaterally, no wheezing, rhonchi or rales  Abd: Soft, nontender, no hepatomegaly  Ext: No edema, pulses 2+ Musculoskeletal: No deformities, BUE and BLE strength normal and equal Skin: Warm and dry, no rashes   Neuro: Alert and oriented to person, place, time, and situation, CNII-XII grossly intact, no focal deficits  Psych: Normal mood and affect   ASSESSMENT:   Katie Knight is a 46 y.o. female who presents for the following: 1. Precordial pain   2. SOB (shortness of breath)   3. Mild intermittent asthma without complication     PLAN:   1. Precordial pain -Sharp central and right-sided chest discomfort.  Occurs randomly.  Does not appear exertional.  Not alleviated by rest.  She does report stress could worsen this.  Highly suspect this is either stress or acid reflux related.  She also has asthma.  She does report some shortness of breath.  Could be contributing.  We will proceed with an exercise treadmill stress test just to exclude any obstructive CAD.  Highly suspect she does not have anything to worry about.  CV exam is normal.  Symptoms really to me are not concerning for angina.  I would like to check an chest x-ray.  We will restart her albuterol inhaler.  Would recommend she try proton pump inhibitor if  exercise treadmill stress test is normal.  Her echocardiogram was normal in 2018 I do not see a need to repeat this.  2. SOB (shortness of breath) 3. Mild intermittent asthma without complication -Intermittent shortness of breath with chest tightness.  No history of asthma.  Refill albuterol inhaler.  We will repeat a chest x-ray.  Further work-up per primary care physician.  Exercise treadmill stress test as above.  Shared Decision Making/Informed Consent The risks [chest pain, shortness of breath, cardiac arrhythmias, dizziness, blood pressure fluctuations, myocardial infarction, stroke/transient ischemic attack, and life-threatening complications (estimated to be 1 in 10,000)], benefits (risk stratification,  diagnosing coronary artery disease, treatment guidance) and alternatives of an exercise tolerance test were discussed in detail with Katie Knight and she agrees to proceed.  Disposition: Return if symptoms worsen or fail to improve.  Medication Adjustments/Labs and Tests Ordered: Current medicines are reviewed at length with the patient today.  Concerns regarding medicines are outlined above.  Orders Placed This Encounter  Procedures   DG Chest 2 View   Cardiac Stress Test: Informed Consent Details: Physician/Practitioner Attestation; Transcribe to consent form and obtain patient signature   EXERCISE TOLERANCE TEST (ETT)   EKG 12-Lead   Meds ordered this encounter  Medications   PROAIR HFA 108 (90 Base) MCG/ACT inhaler    Sig: Inhale 2 puffs into the lungs every 4 (four) hours as needed for wheezing or shortness of breath.    Dispense:  6.7 each    Refill:  6    Patient Instructions  Medication Instructions:  continue Albuterol inhaler   *If you need a refill on your cardiac medications before your next appointment, please call your pharmacy*   Testing/Procedures: Chest xray - Your physician has requested that you have a chest xray, is a fast and painless imaging test that  uses certain electromagnetic waves to create pictures of the structures in and around your chest. This test can help diagnose and monitor conditions such as pneumonia and other lung issues his will be done at [facility]  Imaging [address], [place]. If you should need to call them their phone number is [phone].  Your physician has requested that you have an exercise tolerance test, this is a screening tool to track your fitness level. This test evaluates the your exercise capacity by measuring cardiovascular response to exercise, the stress response is induced by exercise (exercise-treadmill).  Graded exercise test is also known as maximal exercise test or stress EKG test  . Please also follow instruction sheet given.   Follow-Up: At [facility], you and your health needs are our priority.  As part of our continuing mission to provide you with exceptional heart care, we have created designated Provider Care Teams.  These Care Teams include your primary Cardiologist (physician) and Advanced Practice Providers (APPs -  Physician Assistants and Nurse Practitioners) who all work together to provide you with the care you need, when you need it.  We recommend signing up for the patient portal called "MyChart".  Sign up information is provided on this After Visit Summary.  MyChart is used to connect with patients for Virtual Visits (Telemedicine).  Patients are able to view lab/test results, encounter notes, upcoming appointments, etc.  Non-urgent messages can be sent to your provider as well.   To learn more about what you can do with MyChart, go to [email or web address].    Your next appointment:   As needed  The format for your next appointment:   In Person  Provider:   Lennie Odor, MD             Signed, Lenna Gilford. Flora Lipps, MD, [facility]  [facility]  [address] [place], [place] [address] [phone]  12/22/2021 3:24 PM

## 2021-12-22 ENCOUNTER — Encounter: Payer: Self-pay | Admitting: Cardiovascular Disease

## 2021-12-22 ENCOUNTER — Ambulatory Visit (INDEPENDENT_AMBULATORY_CARE_PROVIDER_SITE_OTHER): Payer: BC Managed Care – PPO | Admitting: Cardiovascular Disease

## 2021-12-22 VITALS — BP 128/84 | HR 99 | Ht 65.0 in | Wt 238.0 lb

## 2021-12-22 DIAGNOSIS — R0602 Shortness of breath: Secondary | ICD-10-CM | POA: Diagnosis not present

## 2021-12-22 DIAGNOSIS — R072 Precordial pain: Secondary | ICD-10-CM

## 2021-12-22 DIAGNOSIS — J4520 Mild intermittent asthma, uncomplicated: Secondary | ICD-10-CM

## 2021-12-22 MED ORDER — PROAIR HFA 108 (90 BASE) MCG/ACT IN AERS: 2.0000 | INHALATION_SPRAY | RESPIRATORY_TRACT | 6 refills | Status: DC | PRN

## 2021-12-22 NOTE — Patient Instructions (Addendum)
Medication Instructions:  continue Albuterol inhaler   *If you need a refill on your cardiac medications before your next appointment, please call your pharmacy*   Testing/Procedures: Chest xray - Your physician has requested that you have a chest xray, is a fast and painless imaging test that uses certain electromagnetic waves to create pictures of the structures in and around your chest. This test can help diagnose and monitor conditions such as pneumonia and other lung issues his will be done at [facility]  Imaging [address], [place]. If you should need to call them their phone number is [phone].  Your physician has requested that you have an exercise tolerance test, this is a screening tool to track your fitness level. This test evaluates the your exercise capacity by measuring cardiovascular response to exercise, the stress response is induced by exercise (exercise-treadmill).  Graded exercise test is also known as maximal exercise test or stress EKG test  . Please also follow instruction sheet given.   Follow-Up: At [facility], you and your health needs are our priority.  As part of our continuing mission to provide you with exceptional heart care, we have created designated Provider Care Teams.  These Care Teams include your primary Cardiologist (physician) and Advanced Practice Providers (APPs -  Physician Assistants and Nurse Practitioners) who all work together to provide you with the care you need, when you need it.  We recommend signing up for the patient portal called "MyChart".  Sign up information is provided on this After Visit Summary.  MyChart is used to connect with patients for Virtual Visits (Telemedicine).  Patients are able to view lab/test results, encounter notes, upcoming appointments, etc.  Non-urgent messages can be sent to your provider as well.   To learn more about what you can do with MyChart, go to [email or web address].    Your next appointment:    As needed  The format for your next appointment:   In Person  Provider:   Lennie Odor, MD

## 2021-12-27 ENCOUNTER — Ambulatory Visit
Admission: RE | Admit: 2021-12-27 | Discharge: 2021-12-27 | Disposition: A | Discharge status: Home / Self Care | Payer: BC Managed Care – PPO | Source: Ambulatory Visit | Attending: Cardiovascular Disease | Admitting: Cardiovascular Disease

## 2021-12-27 DIAGNOSIS — J45909 Unspecified asthma, uncomplicated: Secondary | ICD-10-CM | POA: Diagnosis not present

## 2021-12-27 DIAGNOSIS — Z8701 Personal history of pneumonia (recurrent): Secondary | ICD-10-CM | POA: Diagnosis not present

## 2022-01-09 ENCOUNTER — Telehealth (HOSPITAL_COMMUNITY): Payer: Self-pay | Admitting: *Deleted

## 2022-01-10 ENCOUNTER — Ambulatory Visit (HOSPITAL_COMMUNITY)
Admission: RE | Admit: 2022-01-10 | Discharge: 2022-01-10 | Disposition: A | Discharge status: Home / Self Care | Payer: BC Managed Care – PPO | Source: Ambulatory Visit | Attending: Cardiology | Admitting: Cardiology

## 2022-01-10 DIAGNOSIS — R0602 Shortness of breath: Secondary | ICD-10-CM | POA: Insufficient documentation

## 2022-01-10 DIAGNOSIS — R072 Precordial pain: Secondary | ICD-10-CM | POA: Diagnosis not present

## 2022-01-10 LAB — EXERCISE TOLERANCE TEST
Angina Index: 0
Duke Treadmill Score: 8
Estimated workload: 10.4
Exercise duration (min): 8 min
Exercise duration (sec): 1 s
MPHR: 177 {beats}/min
Peak HR: 164 {beats}/min
Percent HR: 92 %
Rest HR: 85 {beats}/min
ST Depression (mm): 0 mm

## 2022-01-19 ENCOUNTER — Encounter: Payer: Self-pay | Admitting: Pulmonary Disease

## 2022-01-19 ENCOUNTER — Ambulatory Visit: Payer: BC Managed Care – PPO | Admitting: Pulmonary Disease

## 2022-01-19 VITALS — BP 116/72 | HR 92 | Temp 98.1°F | Ht 65.0 in | Wt 240.6 lb

## 2022-01-19 DIAGNOSIS — R053 Chronic cough: Secondary | ICD-10-CM

## 2022-01-19 DIAGNOSIS — J4540 Moderate persistent asthma, uncomplicated: Secondary | ICD-10-CM | POA: Diagnosis not present

## 2022-01-19 MED ORDER — BUDESONIDE-FORMOTEROL FUMARATE 160-4.5 MCG/ACT IN AERO: 2.0000 | INHALATION_SPRAY | Freq: Two times a day (BID) | RESPIRATORY_TRACT | 11 refills | Status: DC

## 2022-01-19 MED ORDER — FLUTICASONE PROPIONATE 50 MCG/ACT NA SUSP: 1.0000 | Freq: Two times a day (BID) | NASAL | 2 refills | Status: DC

## 2022-01-19 NOTE — Patient Instructions (Addendum)
Nice to meet you  I think your asthma is not well controlled and we should use a daily inhaler  Please use Symbicort 2 puffs twice a day every day.  Rinse your mouth out with water after every use.  If this is too expensive, please let me know.  Please see me a message and we will look for a more cost effective option.  This may help with the cough that is worse in the morning.  However given the nasal congestion I recommend using Flonase 1 spray each nostril twice a day.  I sent a new prescription in for this as well.  Return to clinic in 2 months or sooner if needed with Dr. Judeth Horn

## 2022-01-31 NOTE — Progress Notes (Signed)
@Patient  ID: Katie Knight, female    DOB: [birth year], 46 y.o.   MRN: [id number]  Chief Complaint  Patient presents with   Consult    Pt states she had a chest xray and pneumonia showed up again. She also has asthma.    Referring provider: [name], MD  HPI:   46 y.o. woman whom we are seeing in consultation for evaluation of cough, dyspnea on exertion, asthma.  Note from referring provider reviewed.  Most recent cardiology note reviewed.  No shortness of breath for the last several months.  With exertion.  Worse on inclines or stairs.  Not much history at rest.  No time of day when things are better or worse.  No position make things better or worse.  Recent prescribed albuterol with mild improvement in symptoms.  No other alleviating or exacerbating factors.  Associated symptoms of cough.  Seems worse in the morning.  Mildly productive.  Not much of an issue today.  Endorses nasal congestion, postnasal drip.  Denies reflux.  Work-up today includes EKG treadmill stress test that was normal 12/2021.  Most recent chest image seen/x-ray 12/27/2021 personally reviewed and interpreted as tenting of the right diaphragm with linear infiltrates most consistent with atelectasis given evidence of volume loss, reviewed reported concern for pneumonia.  Reviewed TTE 2019 that is largely normal.  Reviewed CT chest 02/2018 revealed scattered diffuse peribronchovascular groundglass nodules on my interpretation.  PMH: Asthma Surgical history: D&C Family history: Father with hypertension Social history: Never smoker, lives in [facility]  Questionaires / Pulmonary Flowsheets:   ACT:  Asthma Control Test ACT Total Score  01/19/2022  3:41 PM 18    MMRC:     No data to display          Epworth:      No data to display          Tests:   FENO:  No results found for: "NITRICOXIDE"  PFT:     No data to display          WALK:      No data to display           Imaging: Personally reviewed and as per EMR discussion this note EXERCISE TOLERANCE TEST (ETT)  Result Date: 01/10/2022   No ST deviation was noted.   Prior study not available for comparison. IMPRESSIONS Negative for stress induced arrhythmias. Estimated exercise workload is 10 fMETS, which is average for age and gender. RECOMMENDATIONS/CONCLUSIONS Stress test is a negative study. Low risk study.    Lab Results: Personally reviewed CBC    Component Value Date/Time   WBC 5.9 02/06/2018 2019   RBC 3.69 (L) 02/06/2018 2019   HGB 11.4 (L) 02/06/2018 2019   HCT 36.1 02/06/2018 2019   PLT 366 02/06/2018 2019   MCV 97.8 02/06/2018 2019   MCH 30.9 02/06/2018 2019   MCHC 31.6 02/06/2018 2019   RDW 12.4 02/06/2018 2019   LYMPHSABS 2.4 02/06/2018 2019   MONOABS 0.6 02/06/2018 2019   EOSABS 0.2 02/06/2018 2019   BASOSABS 0.0 02/06/2018 2019    BMET    Component Value Date/Time   NA 138 02/06/2018 2019   K 4.1 02/06/2018 2019   CL 103 02/06/2018 2019   CO2 26 02/06/2018 2019   GLUCOSE 77 02/06/2018 2019   BUN 19 02/06/2018 2019   CREATININE 0.89 02/06/2018 2019   CALCIUM 9.2 02/06/2018 2019   GFRNONAA >60 02/06/2018 2019   GFRAA >60 02/06/2018  2019    BNP No results found for: "BNP"  ProBNP No results found for: "PROBNP"  Specialty Problems   None   No Known Allergies   There is no immunization history on file for this patient.  Past Medical History:  Diagnosis Date   Hyperlipidemia     Tobacco History: Social History   Tobacco Use  Smoking Status Never   Passive exposure: Past  Smokeless Tobacco Never   Counseling given: Not Answered   Continue to not smoke  Outpatient Encounter Medications as of 01/19/2022  Medication Sig   budesonide-formoterol (SYMBICORT) 160-4.5 MCG/ACT inhaler Inhale 2 puffs into the lungs 2 (two) times daily.   fluticasone (FLONASE) 50 MCG/ACT nasal spray Place 1 spray into both nostrils 2 (two) times daily.   LINZESS  145 MCG CAPS capsule Take 145 mcg by mouth daily.   phentermine 37.5 MG capsule Take 37.5 mg by mouth daily.   PROAIR HFA 108 (90 Base) MCG/ACT inhaler Inhale 2 puffs into the lungs every 4 (four) hours as needed for wheezing or shortness of breath.   No facility-administered encounter medications on file as of 01/19/2022.     Review of Systems  Review of Systems  No chest pain with exertion.  No orthopnea or PND.  Comprehensive review systems otherwise negative. Physical Exam  BP 116/72 (BP Location: Right Arm, Patient Position: Sitting, Cuff Size: Large)   Pulse 92   Temp 98.1 F (36.7 C) (Oral)   Ht 5\' 5"  (1.651 m)   Wt 240 lb 9.6 oz (109.1 kg)   LMP  (Within Months)   SpO2 99%   BMI 40.04 kg/m   Wt Readings from Last 5 Encounters:  01/19/22 240 lb 9.6 oz (109.1 kg)  12/22/21 238 lb (108 kg)    BMI Readings from Last 5 Encounters:  01/19/22 40.04 kg/m  12/22/21 39.61 kg/m     Physical Exam General: Well-appearing, no acute distress Eyes: EOMI, no icterus Neck: Supple, no JVP Pulmonary: Clear, normal work of breathing Cardiovascular: Warm, no edema Abdomen: Nondistended, bowel sounds present MSK: Normal joints, no effusion, no synovitis  Neuro: Normal gait, no weakness Psych: Normal mood, full affect   Assessment & Plan:   Dyspnea on exertion: Fear related to poorly controlled asthma.  See below.  Other considerations include deconditioning.  Cough: Possible related to poorly controlled asthma.  Also with postnasal drip.  Flonase prescribed today to help with postnasal drip.  Asthma: Poorly controlled.  Cough, dyspnea.  Occasional wheeze.  Not using maintenance inhaler.  Improves with albuterol.  Prescribed high-dose Symbicort 2 puff twice daily today.  Stressed importance of twice daily adherence.  Continue albuterol.  Abnormal chest x-ray: 12/27/2021 personally reviewed and interpreted as tenting of the right hemi-diaphragm indicative of some volume loss  with linear infiltrates most consistent with atelectasis.  Did not have any significant infectious symptoms.  Cough stable.  No fever chills etc.  No worsening dyspnea. Repeat CXR at next visit or sooner if symptoms fail to improve.    Return in about 2 months (around 03/22/2022).   Karren Burly, MD 01/31/2022

## 2022-03-26 ENCOUNTER — Ambulatory Visit: Payer: BC Managed Care – PPO | Admitting: Pulmonary Disease

## 2022-03-26 ENCOUNTER — Ambulatory Visit (INDEPENDENT_AMBULATORY_CARE_PROVIDER_SITE_OTHER): Payer: BC Managed Care – PPO

## 2022-03-26 ENCOUNTER — Encounter: Payer: Self-pay | Admitting: Pulmonary Disease

## 2022-03-26 VITALS — BP 124/68 | HR 79 | Wt 251.6 lb

## 2022-03-26 DIAGNOSIS — J181 Lobar pneumonia, unspecified organism: Secondary | ICD-10-CM | POA: Diagnosis not present

## 2022-03-26 DIAGNOSIS — J189 Pneumonia, unspecified organism: Secondary | ICD-10-CM | POA: Diagnosis not present

## 2022-03-26 NOTE — Patient Instructions (Signed)
Again  I am glad the Symbicort is helped  Please continue Symbicort as prescribed  To help with some of the cough and nasal congestion I recommend using the sinus rinses we described, you can get the bottle and the salt packets over-the-counter in the cold and flu/cough I will.  It must be used with distilled or purified water.  Rinse your sinuses twice a day for the next week.  Do this before any other intranasal or nasal sprays.  Increase Flonase to 2 sprays each nostril twice a day for the next week then go back to 1 spray each nostril twice a day.  For the next 3 days, use Afrin 2 sprays each nostril twice a day then stop.  Return to clinic in 3 months or sooner as needed with Dr. Judeth Horn

## 2022-03-26 NOTE — Progress Notes (Signed)
Chest xray is stable which is good!

## 2022-03-26 NOTE — Progress Notes (Signed)
@Patient  ID: Katie Knight, female    DOB: 02-16-1976, 46 y.o.   MRN: [id number]  Chief Complaint  Patient presents with   Follow-up    Pt is here for follow up for asthma. Pt is taking Symbicort daily and albuterol as needed. Pt states for 2 weeks she has had a productive cough had been taking the pearls and OTC cough medication.     Referring provider: [name], MD  HPI:   46 y.o. woman whom we are seeing in follow up for evaluation of cough, dyspnea on exertion, asthma.   Returns for scheduled follow-up.  At last visit, concern for uncontrolled asthma.  She was on a maintenance inhaler.  Mains inhaler via high-dose Symbicort 2 puff twice daily was instituted.  Overall cough and mild dyspnea on exertion improvement.  Was doing much better until last 2 weeks.  Developed some significant nasal/sinus congestion.  That is improved somewhat but continues with nasal/sinus congestion.  Endorses postnasal drip.  Endorses some worsening cough over the last 2 weeks.  She continues to use Flonase 1 spray each nostril twice daily.  HPI at initial visit: Noted shortness of breath for the last several months.  With exertion.  Worse on inclines or stairs.  Not much history at rest.  No time of day when things are better or worse.  No position make things better or worse.  Recent prescribed albuterol with mild improvement in symptoms.  No other alleviating or exacerbating factors.  Associated symptoms of cough.  Seems worse in the morning.  Mildly productive.  Not much of an issue today.  Endorses nasal congestion, postnasal drip.  Denies reflux.  Work-up today includes EKG treadmill stress test that was normal 12/2021.  Most recent chest image seen/x-ray 12/27/2021 personally reviewed and interpreted as tenting of the right diaphragm with linear infiltrates most consistent with atelectasis given evidence of volume loss, reviewed reported concern for pneumonia.  Reviewed TTE 2019 that is largely  normal.  Reviewed CT chest 02/2018 revealed scattered diffuse peribronchovascular groundglass nodules on my interpretation.  PMH: Asthma Surgical history: D&C Family history: Father with hypertension Social history: Never smoker, lives in [facility]  Questionaires / Pulmonary Flowsheets:   ACT:  Asthma Control Test ACT Total Score  03/26/2022  9:55 AM 15  01/19/2022  3:41 PM 18    MMRC:     No data to display          Epworth:      No data to display          Tests:   FENO:  No results found for: "NITRICOXIDE"  PFT:     No data to display          WALK:      No data to display          Imaging: Personally reviewed and as per EMR discussion this note No results found.  Lab Results: Personally reviewed CBC    Component Value Date/Time   WBC 5.9 02/06/2018 2019   RBC 3.69 (L) 02/06/2018 2019   HGB 11.4 (L) 02/06/2018 2019   HCT 36.1 02/06/2018 2019   PLT 366 02/06/2018 2019   MCV 97.8 02/06/2018 2019   MCH 30.9 02/06/2018 2019   MCHC 31.6 02/06/2018 2019   RDW 12.4 02/06/2018 2019   LYMPHSABS 2.4 02/06/2018 2019   MONOABS 0.6 02/06/2018 2019   EOSABS 0.2 02/06/2018 2019   BASOSABS 0.0 02/06/2018 2019    BMET  Component Value Date/Time   NA 138 02/06/2018 2019   K 4.1 02/06/2018 2019   CL 103 02/06/2018 2019   CO2 26 02/06/2018 2019   GLUCOSE 77 02/06/2018 2019   BUN 19 02/06/2018 2019   CREATININE 0.89 02/06/2018 2019   CALCIUM 9.2 02/06/2018 2019   GFRNONAA >60 02/06/2018 2019   GFRAA >60 02/06/2018 2019    BNP No results found for: "BNP"  ProBNP No results found for: "PROBNP"  Specialty Problems   None   No Known Allergies   There is no immunization history on file for this patient.  Past Medical History:  Diagnosis Date   Hyperlipidemia     Tobacco History: Social History   Tobacco Use  Smoking Status Never   Passive exposure: Past  Smokeless Tobacco Never   Counseling given: Not  Answered   Continue to not smoke  Outpatient Encounter Medications as of 03/26/2022  Medication Sig   budesonide-formoterol (SYMBICORT) 160-4.5 MCG/ACT inhaler Inhale 2 puffs into the lungs 2 (two) times daily.   fluticasone (FLONASE) 50 MCG/ACT nasal spray Place 1 spray into both nostrils 2 (two) times daily.   LINZESS 145 MCG CAPS capsule Take 145 mcg by mouth daily.   phentermine 37.5 MG capsule Take 37.5 mg by mouth daily.   PROAIR HFA 108 (90 Base) MCG/ACT inhaler Inhale 2 puffs into the lungs every 4 (four) hours as needed for wheezing or shortness of breath.   No facility-administered encounter medications on file as of 03/26/2022.     Review of Systems  Review of Systems  N/a Physical Exam  BP 124/68 (BP Location: Left Arm, Patient Position: Sitting, Cuff Size: Normal)   Pulse 79   Wt 251 lb 9.6 oz (114.1 kg)   SpO2 98%   BMI 41.87 kg/m   Wt Readings from Last 5 Encounters:  03/26/22 251 lb 9.6 oz (114.1 kg)  01/19/22 240 lb 9.6 oz (109.1 kg)  12/22/21 238 lb (108 kg)    BMI Readings from Last 5 Encounters:  03/26/22 41.87 kg/m  01/19/22 40.04 kg/m  12/22/21 39.61 kg/m     Physical Exam General: Well-appearing, no acute distress Eyes: EOMI, no icterus Neck: Supple, no JVP Pulmonary: Clear, normal work of breathing Cardiovascular: Warm, no edema Abdomen: Nondistended, bowel sounds present MSK: Normal joints, no effusion, no synovitis  Neuro: Normal gait, no weakness Psych: Normal mood, full affect   Assessment & Plan:   Dyspnea on exertion: Fear related to poorly controlled asthma.  Improved with ICS/LABA therapy.  See below.  Other considerations include deconditioning.  Cough: Possible related to poorly controlled asthma with some interval improvement with ICS/LABA.Marland Kitchen  Also with postnasal drip likely driver of recent worsening.  Start sinus rinses twice a day for 1 week.  Increase Flonase 2 sprays each nostril twice a day for 1 week then back to 1  spray each nostril twice a day.  Add Afrin 2 sprays each nostril twice a day for 3 days then stop.  Asthma: Cough, dyspnea.  Occasional wheeze.  Improves with albuterol.  Continue high-dose Symbicort 2 puff twice daily given interval improvement.  Stressed importance of twice daily adherence.  Continue albuterol.  Abnormal chest x-ray: 12/27/2021 personally reviewed and interpreted as tenting of the right hemi-diaphragm indicative of some volume loss with linear infiltrates most consistent with atelectasis.  Repeat chest x-ray today to document improvement/resolution.   Return in about 3 months (around 06/25/2022).   Karren Burly, MD 03/26/2022

## 2022-04-02 ENCOUNTER — Encounter: Payer: Self-pay | Admitting: Pulmonary Disease

## 2022-04-02 DIAGNOSIS — R053 Chronic cough: Secondary | ICD-10-CM

## 2022-04-04 MED ORDER — BREZTRI AEROSPHERE 160-9-4.8 MCG/ACT IN AERO: 2.0000 | INHALATION_SPRAY | Freq: Two times a day (BID) | RESPIRATORY_TRACT | 2 refills | Status: DC

## 2022-04-11 ENCOUNTER — Ambulatory Visit (HOSPITAL_BASED_OUTPATIENT_CLINIC_OR_DEPARTMENT_OTHER)
Admission: RE | Admit: 2022-04-11 | Discharge: 2022-04-11 | Disposition: A | Discharge status: Home / Self Care | Payer: BC Managed Care – PPO | Source: Ambulatory Visit | Attending: Pulmonary Disease | Admitting: Pulmonary Disease

## 2022-04-11 DIAGNOSIS — J479 Bronchiectasis, uncomplicated: Secondary | ICD-10-CM | POA: Diagnosis not present

## 2022-04-11 DIAGNOSIS — R918 Other nonspecific abnormal finding of lung field: Secondary | ICD-10-CM | POA: Diagnosis not present

## 2022-04-11 DIAGNOSIS — R053 Chronic cough: Secondary | ICD-10-CM | POA: Diagnosis not present

## 2022-04-14 ENCOUNTER — Emergency Department (HOSPITAL_BASED_OUTPATIENT_CLINIC_OR_DEPARTMENT_OTHER)
Admission: EM | Admit: 2022-04-14 | Discharge: 2022-04-14 | Disposition: A | Discharge status: Home / Self Care | Payer: BC Managed Care – PPO | Attending: Emergency Medicine | Admitting: Emergency Medicine

## 2022-04-14 ENCOUNTER — Encounter (HOSPITAL_BASED_OUTPATIENT_CLINIC_OR_DEPARTMENT_OTHER): Payer: Self-pay | Admitting: Emergency Medicine

## 2022-04-14 ENCOUNTER — Other Ambulatory Visit: Payer: Self-pay

## 2022-04-14 DIAGNOSIS — Z20822 Contact with and (suspected) exposure to covid-19: Secondary | ICD-10-CM | POA: Insufficient documentation

## 2022-04-14 DIAGNOSIS — R059 Cough, unspecified: Secondary | ICD-10-CM | POA: Diagnosis present

## 2022-04-14 DIAGNOSIS — R053 Chronic cough: Secondary | ICD-10-CM | POA: Diagnosis not present

## 2022-04-14 HISTORY — DX: Unspecified asthma, uncomplicated: J45.909

## 2022-04-14 LAB — CBC WITH DIFFERENTIAL/PLATELET
Abs Immature Granulocytes: 0.01 10*3/uL (ref 0.00–0.07)
Basophils Absolute: 0 10*3/uL (ref 0.0–0.1)
Basophils Relative: 1 %
Eosinophils Absolute: 0.2 10*3/uL (ref 0.0–0.5)
Eosinophils Relative: 4 %
HCT: 34 % — ABNORMAL LOW (ref 36.0–46.0)
Hemoglobin: 11 g/dL — ABNORMAL LOW (ref 12.0–15.0)
Immature Granulocytes: 0 %
Lymphocytes Relative: 26 %
Lymphs Abs: 1.2 10*3/uL (ref 0.7–4.0)
MCH: 29.9 pg (ref 26.0–34.0)
MCHC: 32.4 g/dL (ref 30.0–36.0)
MCV: 92.4 fL (ref 80.0–100.0)
Monocytes Absolute: 0.5 10*3/uL (ref 0.1–1.0)
Monocytes Relative: 12 %
Neutro Abs: 2.6 10*3/uL (ref 1.7–7.7)
Neutrophils Relative %: 57 %
Platelets: 375 10*3/uL (ref 150–400)
RBC: 3.68 MIL/uL — ABNORMAL LOW (ref 3.87–5.11)
RDW: 12.5 % (ref 11.5–15.5)
WBC: 4.4 10*3/uL (ref 4.0–10.5)
nRBC: 0 % (ref 0.0–0.2)

## 2022-04-14 LAB — COMPREHENSIVE METABOLIC PANEL
ALT: 10 U/L (ref 0–44)
AST: 16 U/L (ref 15–41)
Albumin: 4 g/dL (ref 3.5–5.0)
Alkaline Phosphatase: 51 U/L (ref 38–126)
Anion gap: 9 (ref 5–15)
BUN: 13 mg/dL (ref 6–20)
CO2: 25 mmol/L (ref 22–32)
Calcium: 8.8 mg/dL — ABNORMAL LOW (ref 8.9–10.3)
Chloride: 104 mmol/L (ref 98–111)
Creatinine, Ser: 0.89 mg/dL (ref 0.44–1.00)
GFR, Estimated: >60 mL/min (ref >60)
Glucose, Bld: 86 mg/dL (ref 70–99)
Potassium: 3.8 mmol/L (ref 3.5–5.1)
Sodium: 138 mmol/L (ref 135–145)
Total Bilirubin: 0.4 mg/dL (ref 0.3–1.2)
Total Protein: 7 g/dL (ref 6.5–8.1)

## 2022-04-14 LAB — SARS CORONAVIRUS 2 BY RT PCR: SARS Coronavirus 2 by RT PCR: NEGATIVE

## 2022-04-14 MED ORDER — PANTOPRAZOLE SODIUM 20 MG PO TBEC: 20.0000 mg | DELAYED_RELEASE_TABLET | Freq: Every day | ORAL | 0 refills | Status: DC

## 2022-04-14 MED ORDER — PROMETHAZINE-DM 6.25-15 MG/5ML PO SYRP: 2.5000 mL | ORAL_SOLUTION | Freq: Four times a day (QID) | ORAL | 0 refills | Status: DC | PRN

## 2022-04-14 NOTE — ED Provider Notes (Signed)
[facility] EMERGENCY DEPT Provider Note   CSN: [id number] Arrival date & time: 04/14/22  1434     History  Chief Complaint  Patient presents with   Cough    Katie Knight is a 46 y.o. female who presents emergency department with chief complaint of cough.  Patient has been having persistent cough to the point of gagging going on for.now Her several months.  She is exposed to chemicals at work.  She has been following with a pulmonologist.  She had a CT chest done recently but has not had the results.  She has taken "everything" for cough.  She is following with Dr. Judeth Horn of [facility] pulmonology.  She has been taking albuterol, a new inhaler, cough suppressants, Tessalon.  She does admit that she has to take Tums frequently and is not on a PPI.  She denies fevers or chills.  The history is provided by the patient.  Cough      Home Medications Prior to Admission medications   Medication Sig Start Date End Date Taking? Authorizing Provider  Budeson-Glycopyrrol-Formoterol (BREZTRI AEROSPHERE) 160-9-4.8 MCG/ACT AERO Inhale 2 puffs into the lungs in the morning and at bedtime. 04/04/22   Hunsucker, Lesia Sago, MD  fluticasone (FLONASE) 50 MCG/ACT nasal spray Place 1 spray into both nostrils 2 (two) times daily. 01/19/22   Hunsucker, Lesia Sago, MD  LINZESS 145 MCG CAPS capsule Take 145 mcg by mouth daily. 12/07/21   [provider]  phentermine 37.5 MG capsule Take 37.5 mg by mouth daily. 01/21/18   [provider]  PROAIR HFA 108 (90 Base) MCG/ACT inhaler Inhale 2 puffs into the lungs every 4 (four) hours as needed for wheezing or shortness of breath. 12/22/21   O'Neal, Ronnald Ramp, MD      Allergies    Patient has no known allergies.    Review of Systems   Review of Systems  Respiratory:  Positive for cough.     Physical Exam Updated Vital Signs BP (!) 161/108 (BP Location: Right Arm)   Pulse 100   Temp 98 F (36.7 C) (Oral)   Resp 20   Ht  5\' 5"  (1.651 m)   Wt 113.4 kg   LMP 04/11/2022   SpO2 100%   BMI 41.60 kg/m  Physical Exam Vitals and nursing note reviewed.  Constitutional:      General: She is not in acute distress.    Appearance: She is well-developed. She is not diaphoretic.  HENT:     Head: Normocephalic and atraumatic.     Right Ear: External ear normal.     Left Ear: External ear normal.     Nose: Nose normal.     Mouth/Throat:     Mouth: Mucous membranes are moist.  Eyes:     General: No scleral icterus.    Conjunctiva/sclera: Conjunctivae normal.  Cardiovascular:     Rate and Rhythm: Normal rate and regular rhythm.     Heart sounds: Normal heart sounds. No murmur heard.    No friction rub. No gallop.  Pulmonary:     Effort: Pulmonary effort is normal. No respiratory distress.     Breath sounds: Normal breath sounds. No wheezing.  Abdominal:     General: Bowel sounds are normal. There is no distension.     Palpations: Abdomen is soft. There is no mass.     Tenderness: There is no abdominal tenderness. There is no guarding.  Musculoskeletal:     Cervical back: Normal range of motion.  Skin:    General: Skin is warm and dry.  Neurological:     Mental Status: She is alert and oriented to person, place, and time.  Psychiatric:        Behavior: Behavior normal.     ED Results / Procedures / Treatments   Labs (all labs ordered are listed, but only abnormal results are displayed) Labs Reviewed  CBC WITH DIFFERENTIAL/PLATELET - Abnormal; Notable for the following components:      Result Value   RBC 3.68 (*)    Hemoglobin 11.0 (*)    HCT 34.0 (*)    All other components within normal limits  COMPREHENSIVE METABOLIC PANEL - Abnormal; Notable for the following components:   Calcium 8.8 (*)    All other components within normal limits  SARS CORONAVIRUS 2 BY RT PCR    EKG None  Radiology No results found.  Procedures Procedures    Medications Ordered in ED Medications - No data to  display  ED Course/ Medical Decision Making/ A&P                           Medical Decision Making Patient here with persistent cough Differential diagnosis for emergent cause of cough includes but is not limited to upper respiratory infection, lower respiratory infection, allergies, asthma, irritants, foreign body, medications such as ACE inhibitors, reflux, asthma, CHF, lung cancer, interstitial lung disease, psychiatric causes, postnasal drip and postinfectious bronchospasm.  I reviewed the patient's recent CT scan, I personally visualized and interpreted results.  I reviewed all findings with the patient and her wife at bedside. I have encouraged the patient to begin a PPI and will prescribe this. Would also like to try Phenergan cough syrup which may be helpful for her at nighttime when she is experiencing coughing. I have encouraged her to follow closely with her pulmonologist as the next this will likely be bronchoscopy and biopsy.  I have also told the patient that I will message her pulmonologist about her ER visit today.  Patient feels comfortable with this at this time.  I reviewed all of her labs.  She is negative for COVID.  White blood cell count is within normal limits.  CMP shows mild hypocalcemia of insignificant value. Gust return precautions and outpatient follow-up.  Amount and/or Complexity of Data Reviewed Labs: ordered.  Risk Prescription drug management.           Final Clinical Impression(s) / ED Diagnoses Final diagnoses:  Chronic cough    Rx / DC Orders ED Discharge Orders     None         Margarita Mail, PA-C 04/14/22 2225    Ezequiel Essex, MD 04/15/22 0009

## 2022-04-14 NOTE — ED Triage Notes (Signed)
Cough x 1 month. Had Chest CT on Wednesday. States she coughs so hard she vomits. This is causing body pain.

## 2022-04-14 NOTE — Discharge Instructions (Signed)
Get help right away if you have severe shortness of breath, fevers, chest pain.  Please follow very closely with Dr. Silas Flood for further evaluation of your lung abnormalities.

## 2022-04-16 NOTE — Telephone Encounter (Signed)
Tried to call pt to see about getting her scheduled for an appt but was unable to reach. Left message for her to return call. Will also send a mychart message to her about some appt times to see if she can do one of these.

## 2022-04-16 NOTE — Telephone Encounter (Signed)
Recent mychart message sent by pt: This MyChart message has not been read.   [facility] (supporting Lanier Clam, MD) 34 minutes ago (9:54 AM)    Rene Paci been waiting for a call about my CT scan. I was seen this past weekend in the ER because coughing and throwing up. The doctor did read my scan and I need some clarity asap. I received a message about making a appointment but, I've been on hold for awhile now.     Dr. Silas Flood, please advise.

## 2022-04-16 NOTE — Telephone Encounter (Signed)
Pt has been scheduled for an appt with MH tomorrow 10/3 at 4:00. Nothing further needed.

## 2022-04-17 ENCOUNTER — Ambulatory Visit: Payer: BC Managed Care – PPO | Admitting: Pulmonary Disease

## 2022-04-17 ENCOUNTER — Encounter: Payer: Self-pay | Admitting: Pulmonary Disease

## 2022-04-17 VITALS — BP 124/68 | HR 90 | Temp 97.9°F | Wt 250.8 lb

## 2022-04-17 DIAGNOSIS — R053 Chronic cough: Secondary | ICD-10-CM | POA: Diagnosis not present

## 2022-04-17 NOTE — Patient Instructions (Signed)
Nice to see you again  I will work on finding a date in the next couple of weeks for a bronchoscopy so that we can hopefully get a biopsy and diagnose what is going on in the lungs.  I think it is quite possible that the biopsy will help [place] come to a diagnosis that would explain your symptoms as well as the imaging findings.  Based on the results, we will decide follow-up.  For now, I will put a placeholder appointment for 3 months.  Return to clinic in 3 months or sooner as needed

## 2022-04-18 NOTE — H&P (View-Only) (Signed)
@Patient  ID: Katie Knight, female    DOB: [birth year], 46 y.o.   MRN: [id number]  Chief Complaint  Patient presents with   Follow-up    Pt is here for follow up for lobar pna. Pt states she has been to the ER several times. She states the medication that was given to her on Saturday is helping. Pt states she coughs so much she is forced to throw up. She states she is still getting SOB when walking. Katie Knight is not helping. Flonase is not helping either. She states the cough syrup is the only thing helping her right now     Referring provider: [name], MD  HPI:   46 y.o. woman whom we are seeing in follow up for evaluation of cough, dyspnea on exertion, asthma.   At last visit - escalated nasal regimen without improvement. With post tussive emesis intermittently. Prompted ED visit 04/11/22.  CTA chest without contrast demonstrated diffuse endobronchial groundglass nodules similar to 03/03/2018 on my review interpretation of both scans.  She was given PPI for GERD symptoms and this could be contributing to cough.  She has not started this.  She is taking promethazine cough syrup and that does provide great relief.  HPI at initial visit: Noted shortness of breath for the last several months.  With exertion.  Worse on inclines or stairs.  Not much history at rest.  No time of day when things are better or worse.  No position make things better or worse.  Recent prescribed albuterol with mild improvement in symptoms.  No other alleviating or exacerbating factors.  Associated symptoms of cough.  Seems worse in the morning.  Mildly productive.  Not much of an issue today.  Endorses nasal congestion, postnasal drip.  Denies reflux.  Work-up today includes EKG treadmill stress test that was normal 12/2021.  Most recent chest image seen/x-ray 12/27/2021 personally reviewed and interpreted as tenting of the right diaphragm with linear infiltrates most consistent with atelectasis given evidence of  volume loss, reviewed reported concern for pneumonia.  Reviewed TTE 2019 that is largely normal.  Reviewed CT chest 02/2018 revealed scattered diffuse peribronchovascular groundglass nodules on my interpretation.  PMH: Asthma Surgical history: D&C Family history: Father with hypertension Social history: Never smoker, lives in [facility]  Questionaires / Pulmonary Flowsheets:   ACT:  Asthma Control Test ACT Total Score  03/26/2022  9:55 AM 15  01/19/2022  3:41 PM 18    MMRC:     No data to display           Epworth:      No data to display           Tests:   FENO:  No results found for: "NITRICOXIDE"  PFT:     No data to display           WALK:      No data to display           Imaging: Personally reviewed and as per EMR discussion this note CT Chest Wo Contrast  Result Date: 04/12/2022 CLINICAL DATA:  Chronic cough persisting greater than 8 weeks EXAM: CT CHEST WITHOUT CONTRAST TECHNIQUE: Multidetector CT imaging of the chest was performed following the standard protocol without IV contrast. RADIATION DOSE REDUCTION: This exam was performed according to the departmental dose-optimization program which includes automated exposure control, adjustment of the mA and/or kV according to patient size and/or use of iterative reconstruction technique. COMPARISON:  CT chest 03/03/2018  FINDINGS: Cardiovascular: Borderline cardiomegaly.  No pericardial effusion. Mediastinum/Nodes: Unchanged enlargement of mediastinal and hilar nodes. For example a precarinal lymph node measures 1.0 cm in short axis. These are favored reactive. Lungs/Pleura: No pleural effusion or pneumothorax. Interlobular septal thickening with some nodularity particularly along the left minor fissure. Centrilobular ground-glass opacities greatest in the lower lungs and with a peribronchovascular distribution. Findings are similar to slightly progressed from 03/03/2018. Bronchiolectasis  in the lower lobes with mild architectural distortion. Upper Abdomen: No acute abnormality. Heterotaxy with right-sided stomach and spleen. Musculoskeletal: No chest wall mass or suspicious bone lesions identified. IMPRESSION: Redemonstration of predominantly peribronchovascular and lower lobe predominant ground-glass nodularity with interlobular septal thickening with some nodularity. This has slightly progressed from 03/03/2018. Differential considerations include hypersensitivity pneumonitis, sarcoidosis, or interstitial lung disease. Heterotaxy syndrome. Electronically Signed   By: Minerva Fester M.D.   On: 04/12/2022 23:41   DG Chest 2 View  Result Date: 03/26/2022 CLINICAL DATA:  Follow-up pneumonia EXAM: CHEST - 2 VIEW COMPARISON:  Chest radiograph December 27, 2021 FINDINGS: Stable cardiac and mediastinal contours. Persistent elevation right hemidiaphragm. Minimal right basilar opacities, similar to prior. No pleural effusion or pneumothorax. Thoracic spine degenerative changes. IMPRESSION: Persistent right hemidiaphragm elevation with right basilar opacities which may represent atelectasis or infection. Electronically Signed   By: Annia Belt M.D.   On: 03/26/2022 11:42    Lab Results: Personally reviewed CBC    Component Value Date/Time   WBC 4.4 04/14/2022 1607   RBC 3.68 (L) 04/14/2022 1607   HGB 11.0 (L) 04/14/2022 1607   HCT 34.0 (L) 04/14/2022 1607   PLT 375 04/14/2022 1607   MCV 92.4 04/14/2022 1607   MCH 29.9 04/14/2022 1607   MCHC 32.4 04/14/2022 1607   RDW 12.5 04/14/2022 1607   LYMPHSABS 1.2 04/14/2022 1607   MONOABS 0.5 04/14/2022 1607   EOSABS 0.2 04/14/2022 1607   BASOSABS 0.0 04/14/2022 1607    BMET    Component Value Date/Time   NA 138 04/14/2022 1607   K 3.8 04/14/2022 1607   CL 104 04/14/2022 1607   CO2 25 04/14/2022 1607   GLUCOSE 86 04/14/2022 1607   BUN 13 04/14/2022 1607   CREATININE 0.89 04/14/2022 1607   CALCIUM 8.8 (L) 04/14/2022 1607   GFRNONAA >60  04/14/2022 1607   GFRAA >60 02/06/2018 2019    BNP No results found for: "BNP"  ProBNP No results found for: "PROBNP"  Specialty Problems   None   No Known Allergies   There is no immunization history on file for this patient.  Past Medical History:  Diagnosis Date   Asthma    Hyperlipidemia     Tobacco History: Social History   Tobacco Use  Smoking Status Never   Passive exposure: Past  Smokeless Tobacco Never   Counseling given: Not Answered   Continue to not smoke  Outpatient Encounter Medications as of 04/17/2022  Medication Sig   Budeson-Glycopyrrol-Formoterol (BREZTRI AEROSPHERE) 160-9-4.8 MCG/ACT AERO Inhale 2 puffs into the lungs in the morning and at bedtime.   fluticasone (FLONASE) 50 MCG/ACT nasal spray Place 1 spray into both nostrils 2 (two) times daily.   LINZESS 145 MCG CAPS capsule Take 145 mcg by mouth daily.   pantoprazole (PROTONIX) 20 MG tablet Take 1 tablet (20 mg total) by mouth daily.   phentermine 37.5 MG capsule Take 37.5 mg by mouth daily.   PROAIR HFA 108 (90 Base) MCG/ACT inhaler Inhale 2 puffs into the lungs every 4 (four) hours as  needed for wheezing or shortness of breath.   promethazine-dextromethorphan (PROMETHAZINE-DM) 6.25-15 MG/5ML syrup Take 2.5 mLs by mouth 4 (four) times daily as needed for cough.   No facility-administered encounter medications on file as of 04/17/2022.     Review of Systems  Review of Systems  N/a Physical Exam  BP 124/68 (BP Location: Right Arm, Patient Position: Sitting, Cuff Size: Normal)   Pulse 90   Temp 97.9 F (36.6 C) (Oral)   Wt 250 lb 12.8 oz (113.8 kg)   LMP 04/11/2022   SpO2 97%   BMI 41.74 kg/m   Wt Readings from Last 5 Encounters:  04/17/22 250 lb 12.8 oz (113.8 kg)  04/14/22 250 lb (113.4 kg)  03/26/22 251 lb 9.6 oz (114.1 kg)  01/19/22 240 lb 9.6 oz (109.1 kg)  12/22/21 238 lb (108 kg)    BMI Readings from Last 5 Encounters:  04/17/22 41.74 kg/m  04/14/22 41.60 kg/m   03/26/22 41.87 kg/m  01/19/22 40.04 kg/m  12/22/21 39.61 kg/m     Physical Exam General: Well-appearing, no acute distress Eyes: EOMI, no icterus Neck: Supple, no JVP Pulmonary: Clear, normal work of breathing Cardiovascular: Warm, no edema Abdomen: Nondistended, bowel sounds present MSK: Normal joints, no effusion, no synovitis  Neuro: Normal gait, no weakness Psych: Normal mood, full affect   Assessment & Plan:   Dyspnea on exertion: Fear related to poorly controlled asthma.  Initially improved with ICS/LABA therapy.  Other considerations include deconditioning.  Now with persistent endobronchial nodules and mild lymphadenopathy possible sarcoidosis as cause.  Cough: Possible related to poorly controlled asthma with some interval improvement with ICS/LABA.Marland Kitchen  Also with postnasal drip likely contributing.  Unfortunate despite high-dose ICS/LABA therapy with mild improvement initially symptoms have worsened.  Did not respond well to aggressive intranasal regimen.  Possibly to sarcoidosis although typically ICS/LABA is a very effective treatment for sarcoidosis cough.  Continue all current treatments.  Promethazine cough syrup is been helpful in treating symptoms.  Scattered endobronchial nodules, groundglass: Possible sarcoidosis with some lymphadenopathy.  Could be contribute to her symptoms and cough has not responded well to inhaled therapies and intranasal therapies.  After discussion, recommend EBUS and endobronchial plus or minus transbronchial biopsies for evaluation of sarcoidosis.  She expressed understanding would like to move forward given persistent symptoms that have not improved despite aggressive intranasal inhalational therapies.   Return in about 3 months (around 07/18/2022).   Karren Burly, MD 04/18/2022  I spent 45 minutes in the care of the patient including review of records, coordination of care, face-to-face visit.

## 2022-04-18 NOTE — Progress Notes (Unsigned)
@Patient  ID: Katie Knight, female    DOB: 1976/02/26, 46 y.o.   MRN: [id number]  Chief Complaint  Patient presents with  . Follow-up    Pt is here for follow up for lobar pna. Pt states she has been to the ER several times. She states the medication that was given to her on Saturday is helping. Pt states she coughs so much she is forced to throw up. She states she is still getting SOB when walking. [name] is not helping. Flonase is not helping either. She states the cough syrup is the only thing helping her right now     Referring provider: [name], MD  HPI:   46 y.o. woman whom we are seeing in follow up for evaluation of cough, dyspnea on exertion, asthma.   At last visit - escalated nasal regimen without improvement. With post tussive emesis intermittently. Prompted ED visit 04/11/22.  HPI at initial visit: Noted shortness of breath for the last several months.  With exertion.  Worse on inclines or stairs.  Not much history at rest.  No time of day when things are better or worse.  No position make things better or worse.  Recent prescribed albuterol with mild improvement in symptoms.  No other alleviating or exacerbating factors.  Associated symptoms of cough.  Seems worse in the morning.  Mildly productive.  Not much of an issue today.  Endorses nasal congestion, postnasal drip.  Denies reflux.  Work-up today includes EKG treadmill stress test that was normal 12/2021.  Most recent chest image seen/x-ray 12/27/2021 personally reviewed and interpreted as tenting of the right diaphragm with linear infiltrates most consistent with atelectasis given evidence of volume loss, reviewed reported concern for pneumonia.  Reviewed TTE 2019 that is largely normal.  Reviewed CT chest 02/2018 revealed scattered diffuse peribronchovascular groundglass nodules on my interpretation.  PMH: Asthma Surgical history: D&C Family history: Father with hypertension Social history: Never smoker, lives  in [facility]  Questionaires / Pulmonary Flowsheets:   ACT:  Asthma Control Test ACT Total Score  03/26/2022  9:55 AM 15  01/19/2022  3:41 PM 18    MMRC:     No data to display           Epworth:      No data to display           Tests:   FENO:  No results found for: "NITRICOXIDE"  PFT:     No data to display           WALK:      No data to display           Imaging: Personally reviewed and as per EMR discussion this note CT Chest Wo Contrast  Result Date: 04/12/2022 CLINICAL DATA:  Chronic cough persisting greater than 8 weeks EXAM: CT CHEST WITHOUT CONTRAST TECHNIQUE: Multidetector CT imaging of the chest was performed following the standard protocol without IV contrast. RADIATION DOSE REDUCTION: This exam was performed according to the departmental dose-optimization program which includes automated exposure control, adjustment of the mA and/or kV according to patient size and/or use of iterative reconstruction technique. COMPARISON:  CT chest 03/03/2018 FINDINGS: Cardiovascular: Borderline cardiomegaly.  No pericardial effusion. Mediastinum/Nodes: Unchanged enlargement of mediastinal and hilar nodes. For example a precarinal lymph node measures 1.0 cm in short axis. These are favored reactive. Lungs/Pleura: No pleural effusion or pneumothorax. Interlobular septal thickening with some nodularity particularly along the left minor fissure. Centrilobular ground-glass opacities greatest  in the lower lungs and with a peribronchovascular distribution. Findings are similar to slightly progressed from 03/03/2018. Bronchiolectasis in the lower lobes with mild architectural distortion. Upper Abdomen: No acute abnormality. Heterotaxy with right-sided stomach and spleen. Musculoskeletal: No chest wall mass or suspicious bone lesions identified. IMPRESSION: Redemonstration of predominantly peribronchovascular and lower lobe predominant ground-glass  nodularity with interlobular septal thickening with some nodularity. This has slightly progressed from 03/03/2018. Differential considerations include hypersensitivity pneumonitis, sarcoidosis, or interstitial lung disease. Heterotaxy syndrome. Electronically Signed   By: Minerva Fester M.D.   On: 04/12/2022 23:41   DG Chest 2 View  Result Date: 03/26/2022 CLINICAL DATA:  Follow-up pneumonia EXAM: CHEST - 2 VIEW COMPARISON:  Chest radiograph December 27, 2021 FINDINGS: Stable cardiac and mediastinal contours. Persistent elevation right hemidiaphragm. Minimal right basilar opacities, similar to prior. No pleural effusion or pneumothorax. Thoracic spine degenerative changes. IMPRESSION: Persistent right hemidiaphragm elevation with right basilar opacities which may represent atelectasis or infection. Electronically Signed   By: Annia Belt M.D.   On: 03/26/2022 11:42    Lab Results: Personally reviewed CBC    Component Value Date/Time   WBC 4.4 04/14/2022 1607   RBC 3.68 (L) 04/14/2022 1607   HGB 11.0 (L) 04/14/2022 1607   HCT 34.0 (L) 04/14/2022 1607   PLT 375 04/14/2022 1607   MCV 92.4 04/14/2022 1607   MCH 29.9 04/14/2022 1607   MCHC 32.4 04/14/2022 1607   RDW 12.5 04/14/2022 1607   LYMPHSABS 1.2 04/14/2022 1607   MONOABS 0.5 04/14/2022 1607   EOSABS 0.2 04/14/2022 1607   BASOSABS 0.0 04/14/2022 1607    BMET    Component Value Date/Time   NA 138 04/14/2022 1607   K 3.8 04/14/2022 1607   CL 104 04/14/2022 1607   CO2 25 04/14/2022 1607   GLUCOSE 86 04/14/2022 1607   BUN 13 04/14/2022 1607   CREATININE 0.89 04/14/2022 1607   CALCIUM 8.8 (L) 04/14/2022 1607   GFRNONAA >60 04/14/2022 1607   GFRAA >60 02/06/2018 2019    BNP No results found for: "BNP"  ProBNP No results found for: "PROBNP"  Specialty Problems   None   No Known Allergies   There is no immunization history on file for this patient.  Past Medical History:  Diagnosis Date  . Asthma   . Hyperlipidemia      Tobacco History: Social History   Tobacco Use  Smoking Status Never  . Passive exposure: Past  Smokeless Tobacco Never   Counseling given: Not Answered   Continue to not smoke  Outpatient Encounter Medications as of 04/17/2022  Medication Sig  . Budeson-Glycopyrrol-Formoterol (BREZTRI AEROSPHERE) 160-9-4.8 MCG/ACT AERO Inhale 2 puffs into the lungs in the morning and at bedtime.  . fluticasone (FLONASE) 50 MCG/ACT nasal spray Place 1 spray into both nostrils 2 (two) times daily.  Marland Kitchen LINZESS 145 MCG CAPS capsule Take 145 mcg by mouth daily.  . pantoprazole (PROTONIX) 20 MG tablet Take 1 tablet (20 mg total) by mouth daily.  . phentermine 37.5 MG capsule Take 37.5 mg by mouth daily.  Marland Kitchen PROAIR HFA 108 (90 Base) MCG/ACT inhaler Inhale 2 puffs into the lungs every 4 (four) hours as needed for wheezing or shortness of breath.  . promethazine-dextromethorphan (PROMETHAZINE-DM) 6.25-15 MG/5ML syrup Take 2.5 mLs by mouth 4 (four) times daily as needed for cough.   No facility-administered encounter medications on file as of 04/17/2022.     Review of Systems  Review of Systems  N/a Physical Exam  BP 124/68 (BP Location: Right Arm, Patient Position: Sitting, Cuff Size: Normal)   Pulse 90   Temp 97.9 F (36.6 C) (Oral)   Wt 250 lb 12.8 oz (113.8 kg)   LMP 04/11/2022   SpO2 97%   BMI 41.74 kg/m   Wt Readings from Last 5 Encounters:  04/17/22 250 lb 12.8 oz (113.8 kg)  04/14/22 250 lb (113.4 kg)  03/26/22 251 lb 9.6 oz (114.1 kg)  01/19/22 240 lb 9.6 oz (109.1 kg)  12/22/21 238 lb (108 kg)    BMI Readings from Last 5 Encounters:  04/17/22 41.74 kg/m  04/14/22 41.60 kg/m  03/26/22 41.87 kg/m  01/19/22 40.04 kg/m  12/22/21 39.61 kg/m     Physical Exam General: Well-appearing, no acute distress Eyes: EOMI, no icterus Neck: Supple, no JVP Pulmonary: Clear, normal work of breathing Cardiovascular: Warm, no edema Abdomen: Nondistended, bowel sounds present MSK:  Normal joints, no effusion, no synovitis  Neuro: Normal gait, no weakness Psych: Normal mood, full affect   Assessment & Plan:   Dyspnea on exertion: Fear related to poorly controlled asthma.  Improved with ICS/LABA therapy.  See below.  Other considerations include deconditioning.  Cough: Possible related to poorly controlled asthma with some interval improvement with ICS/LABA.Marland Kitchen  Also with postnasal drip likely driver of recent worsening.  Start sinus rinses twice a day for 1 week.  Increase Flonase 2 sprays each nostril twice a day for 1 week then back to 1 spray each nostril twice a day.  Add Afrin 2 sprays each nostril twice a day for 3 days then stop.  Asthma: Cough, dyspnea.  Occasional wheeze.  Improves with albuterol.  Continue high-dose Symbicort 2 puff twice daily given interval improvement.  Stressed importance of twice daily adherence.  Continue albuterol.  Abnormal chest x-ray: 12/27/2021 personally reviewed and interpreted as tenting of the right hemi-diaphragm indicative of some volume loss with linear infiltrates most consistent with atelectasis.  Repeat chest x-ray today to document improvement/resolution.   Return in about 3 months (around 07/18/2022).   Karren Burly, MD 04/18/2022

## 2022-04-26 ENCOUNTER — Other Ambulatory Visit: Payer: Self-pay

## 2022-04-26 ENCOUNTER — Encounter: Payer: Self-pay | Admitting: Pulmonary Disease

## 2022-04-26 DIAGNOSIS — Z01818 Encounter for other preprocedural examination: Secondary | ICD-10-CM

## 2022-04-26 NOTE — Telephone Encounter (Signed)
Dr. Silas Flood, please advise on pt's latest message about scheduling a bronchoscopy. Thank you!

## 2022-04-26 NOTE — Telephone Encounter (Addendum)
I spoke to pt at 3:30 this afternoon and gave her appt info for bronch next week. (Disregard Raven's previous note) Nothing further needed.  Will route to triage to close out MyChart message.

## 2022-04-26 NOTE — Addendum Note (Signed)
Addended byLarey Days on: 04/26/2022 02:33 PM   Modules accepted: Orders

## 2022-04-30 ENCOUNTER — Ambulatory Visit (HOSPITAL_COMMUNITY): Payer: BC Managed Care – PPO

## 2022-04-30 ENCOUNTER — Other Ambulatory Visit: Payer: Self-pay

## 2022-04-30 ENCOUNTER — Ambulatory Visit (HOSPITAL_COMMUNITY)
Admission: RE | Admit: 2022-04-30 | Discharge: 2022-04-30 | Disposition: A | Discharge status: Home / Self Care | Payer: BC Managed Care – PPO | Source: Ambulatory Visit | Attending: Pulmonary Disease | Admitting: Pulmonary Disease

## 2022-04-30 ENCOUNTER — Ambulatory Visit (HOSPITAL_COMMUNITY): Payer: BC Managed Care – PPO | Admitting: Anesthesiology

## 2022-04-30 ENCOUNTER — Encounter (HOSPITAL_COMMUNITY): Payer: Self-pay | Admitting: Pulmonary Disease

## 2022-04-30 ENCOUNTER — Encounter (HOSPITAL_COMMUNITY)
Admission: RE | Disposition: A | Discharge status: Home / Self Care | Payer: Self-pay | Source: Ambulatory Visit | Attending: Pulmonary Disease

## 2022-04-30 DIAGNOSIS — R0989 Other specified symptoms and signs involving the circulatory and respiratory systems: Secondary | ICD-10-CM | POA: Insufficient documentation

## 2022-04-30 DIAGNOSIS — J40 Bronchitis, not specified as acute or chronic: Secondary | ICD-10-CM | POA: Diagnosis not present

## 2022-04-30 DIAGNOSIS — Z6841 Body Mass Index (BMI) 40.0 and over, adult: Secondary | ICD-10-CM | POA: Diagnosis not present

## 2022-04-30 DIAGNOSIS — K219 Gastro-esophageal reflux disease without esophagitis: Secondary | ICD-10-CM | POA: Insufficient documentation

## 2022-04-30 DIAGNOSIS — Z1152 Encounter for screening for COVID-19: Secondary | ICD-10-CM | POA: Diagnosis not present

## 2022-04-30 DIAGNOSIS — R918 Other nonspecific abnormal finding of lung field: Secondary | ICD-10-CM

## 2022-04-30 DIAGNOSIS — J449 Chronic obstructive pulmonary disease, unspecified: Secondary | ICD-10-CM | POA: Insufficient documentation

## 2022-04-30 DIAGNOSIS — Z01812 Encounter for preprocedural laboratory examination: Secondary | ICD-10-CM

## 2022-04-30 DIAGNOSIS — R053 Chronic cough: Secondary | ICD-10-CM | POA: Diagnosis not present

## 2022-04-30 DIAGNOSIS — R059 Cough, unspecified: Secondary | ICD-10-CM | POA: Diagnosis not present

## 2022-04-30 DIAGNOSIS — Z9889 Other specified postprocedural states: Secondary | ICD-10-CM | POA: Diagnosis not present

## 2022-04-30 HISTORY — PX: ENDOBRONCHIAL ULTRASOUND: SHX5096

## 2022-04-30 HISTORY — PX: BRONCHIAL WASHINGS: SHX5105

## 2022-04-30 HISTORY — PX: VIDEO BRONCHOSCOPY: SHX5072

## 2022-04-30 HISTORY — PX: BRONCHIAL BIOPSY: SHX5109

## 2022-04-30 HISTORY — PX: BRONCHIAL NEEDLE ASPIRATION BIOPSY: SHX5106

## 2022-04-30 LAB — SARS CORONAVIRUS 2 BY RT PCR: SARS Coronavirus 2 by RT PCR: NEGATIVE

## 2022-04-30 LAB — BODY FLUID CELL COUNT WITH DIFFERENTIAL
Lymphs, Fluid: 7 %
Monocyte-Macrophage-Serous Fluid: 92 % — ABNORMAL HIGH (ref 50–90)
Neutrophil Count, Fluid: 1 % (ref 0–25)
Total Nucleated Cell Count, Fluid: 47 cu mm (ref 0–1000)

## 2022-04-30 LAB — PREGNANCY, URINE: Preg Test, Ur: NEGATIVE

## 2022-04-30 SURGERY — ENDOBRONCHIAL ULTRASOUND (EBUS)
Anesthesia: General

## 2022-04-30 MED ORDER — MIDAZOLAM HCL 5 MG/5ML IJ SOLN
INTRAMUSCULAR | Status: DC | PRN
  Administered 2022-04-30: 2 mg via INTRAVENOUS

## 2022-04-30 MED ORDER — SCOPOLAMINE 1 MG/3DAYS TD PT72
1.0000 | MEDICATED_PATCH | TRANSDERMAL | Status: DC
  Administered 2022-04-30: 1.5 mg via TRANSDERMAL

## 2022-04-30 MED ORDER — FENTANYL CITRATE (PF) 100 MCG/2ML IJ SOLN
INTRAMUSCULAR | Status: AC
  Filled 2022-04-30: qty 2

## 2022-04-30 MED ORDER — DEXAMETHASONE SODIUM PHOSPHATE 10 MG/ML IJ SOLN
INTRAMUSCULAR | Status: DC | PRN
  Administered 2022-04-30: 10 mg via INTRAVENOUS

## 2022-04-30 MED ORDER — SUGAMMADEX SODIUM 200 MG/2ML IV SOLN
INTRAVENOUS | Status: DC | PRN
  Administered 2022-04-30: 25 mg via INTRAVENOUS
  Administered 2022-04-30: 200 mg via INTRAVENOUS

## 2022-04-30 MED ORDER — FENTANYL CITRATE (PF) 250 MCG/5ML IJ SOLN
INTRAMUSCULAR | Status: DC | PRN
  Administered 2022-04-30 (×2): 50 ug via INTRAVENOUS

## 2022-04-30 MED ORDER — ROCURONIUM BROMIDE 10 MG/ML (PF) SYRINGE
PREFILLED_SYRINGE | INTRAVENOUS | Status: DC | PRN
  Administered 2022-04-30: 10 mg via INTRAVENOUS
  Administered 2022-04-30: 60 mg via INTRAVENOUS

## 2022-04-30 MED ORDER — LIDOCAINE HCL 1 % IJ SOLN
INTRAMUSCULAR | Status: AC
  Filled 2022-04-30: qty 20

## 2022-04-30 MED ORDER — PROPOFOL 10 MG/ML IV BOLUS
INTRAVENOUS | Status: AC
  Filled 2022-04-30: qty 20

## 2022-04-30 MED ORDER — ONDANSETRON HCL 4 MG/2ML IJ SOLN
INTRAMUSCULAR | Status: DC | PRN
  Administered 2022-04-30: 4 mg via INTRAVENOUS

## 2022-04-30 MED ORDER — ACETAMINOPHEN 500 MG PO TABS
ORAL_TABLET | ORAL | Status: AC
  Filled 2022-04-30: qty 2

## 2022-04-30 MED ORDER — PROPOFOL 500 MG/50ML IV EMUL
INTRAVENOUS | Status: DC | PRN
  Administered 2022-04-30: 150 ug/kg/min via INTRAVENOUS

## 2022-04-30 MED ORDER — LACTATED RINGERS IV SOLN
INTRAVENOUS | Status: AC | PRN
  Administered 2022-04-30: 20 mL/h via INTRAVENOUS

## 2022-04-30 MED ORDER — MIDAZOLAM HCL 2 MG/2ML IJ SOLN
INTRAMUSCULAR | Status: AC
  Filled 2022-04-30: qty 2

## 2022-04-30 MED ORDER — LIDOCAINE HCL 1 % IJ SOLN
INTRAMUSCULAR | Status: DC | PRN
  Administered 2022-04-30: 6 mL via INTRAPLEURAL

## 2022-04-30 MED ORDER — PROPOFOL 10 MG/ML IV BOLUS
INTRAVENOUS | Status: DC | PRN
  Administered 2022-04-30: 200 mg via INTRAVENOUS

## 2022-04-30 MED ORDER — SCOPOLAMINE 1 MG/3DAYS TD PT72
MEDICATED_PATCH | TRANSDERMAL | Status: AC
  Filled 2022-04-30: qty 1

## 2022-04-30 MED ORDER — ACETAMINOPHEN 500 MG PO TABS
1000.0000 mg | ORAL_TABLET | Freq: Once | ORAL | Status: AC
  Administered 2022-04-30: 1000 mg via ORAL

## 2022-04-30 MED ORDER — LIDOCAINE 2% (20 MG/ML) 5 ML SYRINGE
INTRAMUSCULAR | Status: DC | PRN
  Administered 2022-04-30: 40 mg via INTRAVENOUS

## 2022-04-30 NOTE — Interval H&P Note (Signed)
Pre Procedure note for inpatients:   Katie Knight has been scheduled for Procedure(s): ENDOBRONCHIAL ULTRASOUND (N/A), flexible bronchoscopy, transbronchial needle aspiration, bronchoalveolar lavage, endobronchial biopsy, transbronchial biopsy today. The various methods of treatment have been discussed with the patient. After consideration of the risks, benefits and treatment options the patient has consented to the planned procedure.   The patient has been seen and labs reviewed. There are no changes in the patient's condition to prevent proceeding with the planned procedure today.  Recent labs:  Lab Results  Component Value Date   WBC 4.4 04/14/2022   HGB 11.0 (L) 04/14/2022   HCT 34.0 (L) 04/14/2022   PLT 375 04/14/2022   GLUCOSE 86 04/14/2022   ALT 10 04/14/2022   AST 16 04/14/2022   NA 138 04/14/2022   K 3.8 04/14/2022   CL 104 04/14/2022   CREATININE 0.89 04/14/2022   BUN 13 04/14/2022   CO2 25 04/14/2022    Lanier Clam, MD 04/30/2022 10:38 AM

## 2022-04-30 NOTE — Anesthesia Preprocedure Evaluation (Addendum)
Anesthesia Evaluation  Patient identified by MRN, date of birth, ID band Patient awake    Reviewed: Allergy & Precautions, NPO status , Patient's Chart, lab work & pertinent test results  History of Anesthesia Complications Negative for: history of anesthetic complications  Airway Mallampati: II  TM Distance: >3 FB Neck ROM: Full    Dental  (+) Dental Advisory Given, Teeth Intact   Pulmonary asthma , COPD,  COPD inhaler,  Lung nodules   breath sounds clear to auscultation       Cardiovascular negative cardio ROS   Rhythm:Regular Rate:Normal  12/2021 Stress: No ST deviation was noted. The ECG was negative for ischemia    Neuro/Psych negative neurological ROS     GI/Hepatic Neg liver ROS, GERD  Medicated and Controlled,  Endo/Other  Morbid obesityBMI 41.4  Renal/GU negative Renal ROS     Musculoskeletal   Abdominal (+) + obese,   Peds  Hematology negative hematology ROS (+)   Anesthesia Other Findings   Reproductive/Obstetrics                            Anesthesia Physical Anesthesia Plan  ASA: 2  Anesthesia Plan: General   Post-op Pain Management: Minimal or no pain anticipated and Tylenol PO (pre-op)*   Induction: Intravenous  PONV Risk Score and Plan: 3 and Ondansetron, Dexamethasone and Scopolamine patch - Pre-op  Airway Management Planned: Oral ETT  Additional Equipment: None  Intra-op Plan:   Post-operative Plan: Extubation in OR  Informed Consent: I have reviewed the patients History and Physical, chart, labs and discussed the procedure including the risks, benefits and alternatives for the proposed anesthesia with the patient or authorized representative who has indicated his/her understanding and acceptance.     Dental advisory given  Plan Discussed with: CRNA and Surgeon  Anesthesia Plan Comments:        Anesthesia Quick Evaluation

## 2022-04-30 NOTE — Brief Op Note (Signed)
Procedure: Flexible bronchoscopy, endobronchial ultrasound, transbronchial needle aspiration, bronchoalveolar lavage, endobronchial biopsy  Patient tolerated procedure well.  See full operative note for description.  Estimated blood loss: 10 mL

## 2022-04-30 NOTE — Op Note (Signed)
[facility] Cardiopulmonary Patient Name: Katie Knight Procedure Date: 04/30/2022 MRN: [id number] Attending MD: Karren Burly MD,  Date of Birth: Feb 19, 1976 CSN: [id number] Age: 46 Admit Type: Inpatient Ethnicity: Not Hispanic or Latino Procedure:             Bronchoscopy Indications:           Bilateral recurring infiltrates, Chronic cough with                         abnormal CT Providers:             Lesia Sago. Adrijana Haros MD, Vicki Mallet, RN, Rozetta Nunnery, Technician Referring MD:           Medicines:             Lidocaine 1% applied to the tracheobronchial tree 6                         mL, General Anesthesia, Monitored Anesthesia Care, See                         the Anesthesia note for documentation of the                         administered medications Complications:         No immediate complications Estimated Blood Loss:  Estimated blood loss: none. Estimated blood loss was                         minimal. Procedure:      Pre-Anesthesia Assessment:      - Universal Protocol:      - Pre-procedure Verification: Prior to the procedure, the patient's       identity was verified by full name, date of birth and medical record       number. The patient's identity was verified on all pertinent medical       records, including History and Physical. Also prior to the procedure, a       History and Physical was performed, and patient medications, allergies       and sensitivities were reviewed. The patient's tolerance of previous       anesthesia was reviewed. The risks and benefits of the procedure and the       sedation options and risks were discussed with the patient. All       questions were answered and informed consent was obtained.      - Time-Out: Prior to the start of the procedure, the patient's       identification, proposed procedure, accurate signed consent, correctly       labeled images and records, and need for  prophylactic antibiotics were       verified by the physician, the nurse and the technician in the endoscopy       suite.      - ASA Grade Assessment: II - A patient with mild systemic disease.      - Airway Examination: Mallampati Class II (the uvula but not tonsillar       pillars visualized).      After obtaining informed consent, the bronchoscope was passed under  direct vision. Throughout the procedure, the patient's blood pressure,       pulse, and oxygen saturations were monitored continuously. the [id number]       ([id number]) Olympus bronoscope was introduced through the mouth, via the       endotracheal tube (the patient was intubated for the procedure) and       advanced to the tracheobronchial tree of both lungs. [id number] ebus The       procedure was accomplished without difficulty. The patient tolerated the       procedure well. The total duration of the procedure was 45 minutes. Findings:      Bilateral Lung Abnormalities: Erythema was found throughout the       tracheobronchial tree. An area of inflamed mucosa was found throughout       the tracheobronchial tree. Left-sided anatomy was normal to the       subsegmental level. On the right, right upper lobe and right middle lobe       takeoffs were adjacent to each other and more medial in terms of right       upper lobe takeoff than expected. Right lower lobe anatomy was normal to       the subsegmental level. Endobronchial biopsies were performed using       forceps and sent for histopathology examination. Three samples were       obtained From the carina of the bronchus intermedius and right lower       lobe. An endobronchial ultrasound endoscope was utilized in order to       assist with guiding the biopsy needle. Station 4 node previously       enlarged on CT scan was 5 mm or less. Station 7 node appeared to be       about 1 cm and appeared to be larger going distally when observed from       the left mainstem but there  was white stripe what appeared to be a clear       capsule more proximal to the carina and this was the area that was       exclusively sampled. 2 passes were examined during the procedure and       lymphocytes were visualized. 4 passes were sent to block. Impression:      - Erythema was present throughout the tracheobronchial tree.      - Mucosal inflammation was visualized throughout the tracheobronchial       tree.      - An endobronchial biopsy was performed.      - Endobronchial ultrasound was performed. Moderate Sedation:      n/a      N/A: per anesthesia care Recommendation:      - Discharge patient to home (ambulatory).      - Await BAL, biopsy, culture and cytology results. Procedure Code(s):      --- Professional ---      [phone], Bronchoscopy, rigid or flexible, including fluoroscopic guidance,       when performed; with bronchial or endobronchial biopsy(s), single or       multiple sites      31624, Bronchoscopy, rigid or flexible, including fluoroscopic guidance,       when performed; with bronchial alveolar lavage      31654, Bronchoscopy, rigid or flexible, including fluoroscopic guidance,       when performed; with transendoscopic endobronchial ultrasound (EBUS)       during bronchoscopic diagnostic or therapeutic intervention(s)  for       peripheral lesion(s) (List separately in addition to code for primary       procedure[s]) Diagnosis Code(s):      --- Professional ---      J40, Bronchitis, not specified as acute or chronic      R09.89, Other specified symptoms and signs involving the circulatory and       respiratory systems CPT copyright 2019 [employer]. All rights reserved. The codes documented in this report are preliminary and upon coder review may  be revised to meet current compliance requirements. [facility] Karren Burly MD,  04/30/2022 2:34:38 PM Number of Addenda: 0 Scope In: Scope Out:

## 2022-04-30 NOTE — Discharge Instructions (Signed)
Endobronchial Ultrasound A person with an outline of the lungs and a separate image showing a bronchoscope.   Endobronchial ultrasound (EBUS) is a procedure done to check your lungs for inflammation, infection, or cancer. During this procedure, a thin and flexible scope (bronchoscope) is passed through your mouth and into one or both of your lungs. The bronchoscope has a video camera and an ultrasound probe on the end. The ultrasound probe uses sound waves to take imaging studies of your lung and the lymph nodes near your lung. The camera will send images of your lung to a video screen in the procedure room. A needle passed through the scope may be used to take lung tissue samples for evaluation and testing (needle biopsy). Tell a health care provider about: Any allergies you have. All medicines you are taking, including vitamins, herbs, eye drops, creams, and over-the-counter medicines. Any problems you or family members have had with anesthetic medicines. Any bleeding problems you have. Any surgeries you have had. Any medical conditions you have. Whether you are pregnant or may be pregnant. What are the risks? Generally, this is a safe procedure. However, problems may occur, including: Infection. Bleeding. Allergic reactions to medicines. Damage to nearby structures or organs. Lung collapse. What happens before the procedure? Medicines Ask your health care provider about: Changing or stopping your regular medicines. This is especially important if you are taking diabetes medicines or blood thinners. Taking medicines such as aspirin and ibuprofen. These medicines can thin your blood. Do not take these medicines unless your health care provider tells you to take them. Taking over-the-counter medicines, vitamins, herbs, and supplements. General instructions Follow instructions from your health care provider about eating and drinking, which may include having nothing to eat or drink after  midnight on the night before the procedure. If you will be going home right after the procedure, plan to have a responsible adult: Take you home from the hospital or clinic. You will not be allowed to drive. Care for you for the time you are told. What happens during the procedure? An IV will be inserted into one of your veins. You will be given one or more of the following: A medicine to help you relax (sedative). This may make you sleepy. A medicine to numb the area (local anesthetic). This medicine may be sprayed into your throat. It will make you feel more comfortable and keep you from gagging or coughing during the procedure. A medicine to make you fall asleep (general anesthetic). When you are sleepy and relaxed, the scope will be passed through your mouth, into your windpipe, and down into your lung or lungs. Your health care provider will view the video images from the camera to look at the inside of your airways. Ultrasound imaging studies may be done to get information about lung tissue or the lymph nodes near your lungs. A needle may be used to take tissue samples. When the procedure is over, the scope will be removed, and you will be taken to a recovery area. The procedure may vary among health care providers and hospitals. What can I expect after the procedure? Your blood pressure, heart rate, breathing rate, and blood oxygen level will be monitored until you leave the hospital or clinic. If a tissue sample was taken, it is up to you to get the results of your procedure. Ask your health care provider, or the department that is doing the procedure, when your results will be ready. In most cases, you may  go home after the procedure. After this procedure, it is common to have a sore throat and a slight cough. These should go away in 1-2 days. Follow these instructions at home: Medicines Take over-the-counter and prescription medicines only as told by your health care provider. If  you were given a sedative during the procedure, it can affect you for several hours. Do not drive or operate machinery until your health care provider says that it is safe. General instructions A sign showing that a person should not smoke.   Do not eat or drink anything until the numbing medicine (local anesthetic) has worn off and your gag reflex has returned. You will know that the local anesthetic has worn off when you can swallow comfortably. Do not use any products that contain nicotine or tobacco. These products include cigarettes, chewing tobacco, and vaping devices, such as e-cigarettes. If you need help quitting, ask your health care provider. Return to your normal activities as told by your health care provider. Ask your health care provider what activities are safe for you. Keep all follow-up visits. This is important. Contact a health care provider if: You have chills or a fever. You have a cough or sore throat that does not go away. You cough up blood. Get help right away if: You have chest pain or difficulty breathing. These symptoms may represent a serious problem that is an emergency. Do not wait to see if the symptoms will go away. Get medical help right away. Call your local emergency services (911 in the [place] Do not drive yourself to the hospital. Summary EBUS is done to check your lungs for inflammation, infection, or cancer. During this procedure, a bronchoscope is passed through your mouth and into one or both of your lungs. The bronchoscope has a video camera and an ultrasound probe on the end. The camera will send images of your lung to a video screen in the procedure room. A needle biopsy may also be done during the procedure. In most cases, you may go home after the procedure. This information is not intended to replace advice given to you by your health care provider. Make sure you discuss any questions you have with your health care provider. Document Revised:  12/27/2020 Document Reviewed: 12/27/2020 Elsevier Patient Education  2023 [employer].

## 2022-04-30 NOTE — Anesthesia Procedure Notes (Signed)
Procedure Name: Intubation Date/Time: 04/30/2022 11:56 AM  Performed by: Milford Cage, CRNAPre-anesthesia Checklist: Patient identified, Emergency Drugs available, Suction available and Patient being monitored Patient Re-evaluated:Patient Re-evaluated prior to induction Oxygen Delivery Method: Circle system utilized Preoxygenation: Pre-oxygenation with 100% oxygen Induction Type: IV induction Ventilation: Mask ventilation without difficulty Laryngoscope Size: Miller and 2 Grade View: Grade I Tube type: Oral Tube size: 8.5 mm Number of attempts: 1 Airway Equipment and Method: Stylet Placement Confirmation: ETT inserted through vocal cords under direct vision, positive ETCO2 and breath sounds checked- equal and bilateral Secured at: 23 cm Tube secured with: Tape Dental Injury: Teeth and Oropharynx as per pre-operative assessment

## 2022-04-30 NOTE — Op Note (Signed)
Flexible and EBUS Bronchoscopy Procedure Note  Faythe Heitzenrater  [id number]  04/02/1976  Date:04/30/22  Time:1:11 PM   Provider Performing:Hance Caspers R Romano Stigger   Procedure: Flexible bronchoscopy and EBUS Bronchoscopy  Indication(s) Cough, infiltrates, LAD  Consent Risks of the procedure as well as the alternatives and risks of each were explained to the patient and/or caregiver.  Consent for the procedure was obtained.  Anesthesia General Anesthesia   Time Out Verified patient identification, verified procedure, site/side was marked, verified correct patient position, special equipment/implants available, medications/allergies/relevant history reviewed, required imaging and test results available.   Sterile Technique Usual hand hygiene, masks, gowns, and gloves were used   Procedure Description Diagnostic bronchoscope advanced through endotracheal tube and into airway.  Airways were examined down to subsegmental level with findings noted below.  Following diagnostic evaluation, BAL performed in RML medial segment.  The diagnostic bronchoscope was then removed and the EBUS bronchoscope was advanced into airway with stations 7 biopsied and sent for slide with lymphocytes seen, then to cell block.  The EBUS bronchoscope was removed after assuring no active bleeding from biopsy site.  The diagnostic bronchoscope was then reintroduced.  Using forceps, 3 endobronchial biopsies were taken at the carina of the right upper lobe and bronchus intermedius.  The diagnostic bronchoscope was then used to suction scant blood.  Hemostasis was noted at biopsy site.  The diagnostic bronchoscope was removed.  Findings: EBUS with TBNA, BAL, endobronchial biopsy   Complications/Tolerance None; patient tolerated the procedure well. Chest X-ray is needed post procedure.   EBL Minimal   Specimen(s) Sent to lab

## 2022-04-30 NOTE — Transfer of Care (Signed)
Immediate Anesthesia Transfer of Care Note  Patient: Katie Knight  Procedure(s) Performed: ENDOBRONCHIAL ULTRASOUND BRONCHIAL WASHINGS VIDEO BRONCHOSCOPY WITHOUT FLUORO BRONCHIAL NEEDLE ASPIRATION BIOPSIES BRONCHIAL BIOPSIES  Patient Location: Endoscopy Unit  Anesthesia Type:General  Level of Consciousness: awake, oriented and patient cooperative  Airway & Oxygen Therapy: Patient Spontanous Breathing and Patient connected to face mask oxygen  Post-op Assessment: Report given to RN and Post -op Vital signs reviewed and stable  Post vital signs: Reviewed  Last Vitals:  Vitals Value Taken Time  BP    Temp    Pulse 101 04/30/22 1317  Resp 19 04/30/22 1317  SpO2 100 % 04/30/22 1317  Vitals shown include unvalidated device data.  Last Pain:  Vitals:   04/30/22 0958  TempSrc: Temporal  PainSc: 0-No pain         Complications: No notable events documented.

## 2022-04-30 NOTE — Anesthesia Postprocedure Evaluation (Signed)
Anesthesia Post Note  Patient: Katie Knight  Procedure(s) Performed: ENDOBRONCHIAL ULTRASOUND BRONCHIAL WASHINGS VIDEO BRONCHOSCOPY WITHOUT FLUORO BRONCHIAL NEEDLE ASPIRATION BIOPSIES BRONCHIAL BIOPSIES     Patient location during evaluation: Endoscopy Anesthesia Type: General Level of consciousness: awake and alert, patient cooperative and oriented Pain management: pain level controlled Vital Signs Assessment: post-procedure vital signs reviewed and stable Respiratory status: spontaneous breathing, nonlabored ventilation and respiratory function stable Cardiovascular status: blood pressure returned to baseline and stable Postop Assessment: no apparent nausea or vomiting Anesthetic complications: no   No notable events documented.  Last Vitals:  Vitals:   04/30/22 1336 04/30/22 1346  BP: (!) 171/87 (!) 151/87  Pulse: 86 77  Resp: 15 20  Temp:    SpO2: 94% 95%    Last Pain:  Vitals:   04/30/22 1346  TempSrc:   PainSc: 0-No pain                 Doriann Zuch,E. Takahiro Godinho

## 2022-05-01 ENCOUNTER — Encounter: Payer: Self-pay | Admitting: Emergency Medicine

## 2022-05-02 ENCOUNTER — Other Ambulatory Visit: Payer: Self-pay | Admitting: Pulmonary Disease

## 2022-05-02 DIAGNOSIS — R053 Chronic cough: Secondary | ICD-10-CM

## 2022-05-02 LAB — CULTURE, BAL-QUANTITATIVE W GRAM STAIN
Culture: 7000 — AB
Gram Stain: NONE SEEN

## 2022-05-02 LAB — SURGICAL PATHOLOGY

## 2022-05-02 LAB — CYTOLOGY - NON PAP

## 2022-05-03 NOTE — Progress Notes (Signed)
Still awaiting final results from the procedure.  I sent a message to the pathologist regarding the type of inflammation seen on the biopsy.  I am awaiting their response.  No changes to the plan as of now.  I hope to have full results by next week.

## 2022-05-04 ENCOUNTER — Telehealth: Payer: Self-pay | Admitting: Pulmonary Disease

## 2022-05-04 NOTE — Telephone Encounter (Signed)
Dr. Silas Flood, please see the last couple of messages from pt. She is requesting a work note saying she is to be out of work until her next appt (10/31). Please advise. Thanks.

## 2022-05-04 NOTE — Telephone Encounter (Signed)
Written letter

## 2022-05-05 ENCOUNTER — Encounter (HOSPITAL_COMMUNITY): Payer: Self-pay | Admitting: Pulmonary Disease

## 2022-05-06 NOTE — Telephone Encounter (Signed)
OK with work note stating she is under my care and she is to be out of work until follow up with me on 10/31.

## 2022-05-07 ENCOUNTER — Telehealth: Payer: Self-pay | Admitting: Pulmonary Disease

## 2022-05-07 NOTE — Telephone Encounter (Signed)
Patient brought in [employer] form during 04/17/2022 appointment.  I spoke to her today and even though the letter was written to her employer on 10/20, the FMLA form still needs to be completed.  She states prior to the 10/3 office visit she was missing 1 day a week of work (12 hour shifts).   I have completed as much of the form as I can and will provide to Dr. Silas Flood to complete and sign when he is in the office on 10/25.

## 2022-05-10 LAB — CYTOLOGY - NON PAP

## 2022-05-10 MED ORDER — BENZONATATE 200 MG PO CAPS: 200.0000 mg | ORAL_CAPSULE | Freq: Three times a day (TID) | ORAL | 2 refills | Status: DC | PRN

## 2022-05-10 NOTE — Telephone Encounter (Signed)
This is a hunsucker patient and he is off today.  Please advise on the picture the patient sent?

## 2022-05-10 NOTE — Addendum Note (Signed)
Addended by: Dessie Coma on: 05/10/2022 12:14 PM   Modules accepted: Orders

## 2022-05-10 NOTE — Telephone Encounter (Signed)
Sorry to here that she is still coughing so severely. Recommend Delsym 2 teaspoons twice daily as needed for cough. May use Tessalon 200 mg 1 3 times daily as needed for cough #45 with 2 refills. Keep office visit next week as planned.  If getting worse or are not improving can work in for sick visit this week.  Please contact office for sooner follow up if symptoms do not improve or worsen or seek emergency care

## 2022-05-11 NOTE — Telephone Encounter (Signed)
Dr. Silas Flood completed form and I have faxed it to [place] - fax# [phone].  Mailed hard copy to patient.   Applied $29 processing fee

## 2022-05-15 ENCOUNTER — Encounter: Payer: Self-pay | Admitting: Pulmonary Disease

## 2022-05-15 ENCOUNTER — Ambulatory Visit: Payer: BC Managed Care – PPO | Admitting: Pulmonary Disease

## 2022-05-15 VITALS — BP 132/72 | HR 81 | Wt 257.0 lb

## 2022-05-15 DIAGNOSIS — R053 Chronic cough: Secondary | ICD-10-CM

## 2022-05-15 MED ORDER — AZITHROMYCIN 250 MG PO TABS: ORAL_TABLET | ORAL | 0 refills | Status: AC

## 2022-05-15 MED ORDER — PREDNISONE 5 MG PO TABS: ORAL_TABLET | ORAL | 0 refills | Status: AC

## 2022-05-15 NOTE — Progress Notes (Signed)
@Patient  ID: Katie Knight, female    DOB: [birth year], 46 y.o.   MRN: [id number]  Chief Complaint  Patient presents with  . Follow-up    Pt is here for a visit for her to be cleared to return to work after her bronch.     Referring provider: [name], MD  HPI:   46 y.o. woman whom we are seeing in follow up for evaluation of cough, dyspnea on exertion, asthma.   At last visit - escalated nasal regimen without improvement. With post tussive emesis intermittently. Prompted ED visit 04/11/22.  CTA chest without contrast demonstrated diffuse endobronchial groundglass nodules similar to 03/03/2018 on my review interpretation of both scans.  She was given PPI for GERD symptoms and this could be contributing to cough.  She has not started this.  She is taking promethazine cough syrup and that does provide great relief.  HPI at initial visit: Noted shortness of breath for the last several months.  With exertion.  Worse on inclines or stairs.  Not much history at rest.  No time of day when things are better or worse.  No position make things better or worse.  Recent prescribed albuterol with mild improvement in symptoms.  No other alleviating or exacerbating factors.  Associated symptoms of cough.  Seems worse in the morning.  Mildly productive.  Not much of an issue today.  Endorses nasal congestion, postnasal drip.  Denies reflux.  Work-up today includes EKG treadmill stress test that was normal 12/2021.  Most recent chest image seen/x-ray 12/27/2021 personally reviewed and interpreted as tenting of the right diaphragm with linear infiltrates most consistent with atelectasis given evidence of volume loss, reviewed reported concern for pneumonia.  Reviewed TTE 2019 that is largely normal.  Reviewed CT chest 02/2018 revealed scattered diffuse peribronchovascular groundglass nodules on my interpretation.  PMH: Asthma Surgical history: D&C Family history: Father with hypertension Social history:  Never smoker, lives in [facility]  Questionaires / Pulmonary Flowsheets:   ACT:  Asthma Control Test ACT Total Score  03/26/2022  9:55 AM 15  01/19/2022  3:41 PM 18    MMRC:     No data to display           Epworth:      No data to display           Tests:   FENO:  No results found for: "NITRICOXIDE"  PFT:     No data to display           WALK:      No data to display           Imaging: Personally reviewed and as per EMR discussion this note DG CHEST PORT 1 VIEW  Result Date: 04/30/2022 CLINICAL DATA:  Status post bronchoscopy EXAM: PORTABLE CHEST 1 VIEW COMPARISON:  03/26/2022 FINDINGS: The heart size and mediastinal contours are within normal limits. Increased perihilar and bibasilar interstitial markings is similar to prior. No pleural effusion or pneumothorax. 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 IMPRESSION: 1. No pneumothorax following bronchoscopy. 2. Increased perihilar and bibasilar interstitial markings is similar to prior. Electronically Signed   By: Duanne Guess D.O.   On: 04/30/2022 13:47    Lab Results: Personally reviewed CBC    Component Value Date/Time   WBC 4.4 04/14/2022 1607   RBC 3.68 (L) 04/14/2022 1607   HGB 11.0 (L) 04/14/2022 1607   HCT 34.0 (L) 04/14/2022 1607   PLT 375 04/14/2022 1607  MCV 92.4 04/14/2022 1607   MCH 29.9 04/14/2022 1607   MCHC 32.4 04/14/2022 1607   RDW 12.5 04/14/2022 1607   LYMPHSABS 1.2 04/14/2022 1607   MONOABS 0.5 04/14/2022 1607   EOSABS 0.2 04/14/2022 1607   BASOSABS 0.0 04/14/2022 1607    BMET    Component Value Date/Time   NA 138 04/14/2022 1607   K 3.8 04/14/2022 1607   CL 104 04/14/2022 1607   CO2 25 04/14/2022 1607   GLUCOSE 86 04/14/2022 1607   BUN 13 04/14/2022 1607   CREATININE 0.89 04/14/2022 1607   CALCIUM 8.8 (L) 04/14/2022 1607   GFRNONAA >60 04/14/2022 1607   GFRAA >60 02/06/2018 2019    BNP No results found  for: "BNP"  ProBNP No results found for: "PROBNP"  Specialty Problems   None   No Known Allergies   There is no immunization history on file for this patient.  Past Medical History:  Diagnosis Date  . Asthma   . Hyperlipidemia     Tobacco History: Social History   Tobacco Use  Smoking Status Never  . Passive exposure: Past  Smokeless Tobacco Never   Counseling given: Not Answered   Continue to not smoke  Outpatient Encounter Medications as of 05/15/2022  Medication Sig  . azithromycin (ZITHROMAX) 250 MG tablet Take 2 tablets (500 mg) on  Day 1,  followed by 1 tablet (250 mg) once daily on Days 2 through 5.  . benzonatate (TESSALON) 200 MG capsule Take 1 capsule (200 mg total) by mouth 3 (three) times daily as needed for cough.  . Budeson-Glycopyrrol-Formoterol (BREZTRI AEROSPHERE) 160-9-4.8 MCG/ACT AERO Inhale 2 puffs into the lungs in the morning and at bedtime. (Patient taking differently: Inhale 1 puff into the lungs daily. Additional puff if needed during the day)  . budesonide-formoterol (SYMBICORT) 160-4.5 MCG/ACT inhaler Inhale 2 puffs into the lungs daily as needed (if don't  have other inhaler).  . fluticasone (FLONASE) 50 MCG/ACT nasal spray Place 1 spray into both nostrils 2 (two) times daily.  Marland Kitchen LINZESS 145 MCG CAPS capsule Take 145 mcg by mouth daily.  . Multiple Vitamins-Minerals (MULTIVITAMIN WITH MINERALS) tablet Take 1 tablet by mouth daily. gummy  . oxymetazoline (AFRIN) 0.05 % nasal spray Place 1 spray into both nostrils daily as needed for congestion.  . pantoprazole (PROTONIX) 20 MG tablet Take 1 tablet (20 mg total) by mouth daily.  . predniSONE (DELTASONE) 5 MG tablet Take 4 tablets (20 mg total) by mouth daily with breakfast for 7 days, THEN 3 tablets (15 mg total) daily with breakfast for 7 days, THEN 2 tablets (10 mg total) daily with breakfast for 7 days, THEN 1 tablet (5 mg total) daily with breakfast for 7 days.  Marland Kitchen PROAIR HFA 108 (90 Base)  MCG/ACT inhaler Inhale 2 puffs into the lungs every 4 (four) hours as needed for wheezing or shortness of breath.  . promethazine-dextromethorphan (PROMETHAZINE-DM) 6.25-15 MG/5ML syrup Take 2.5 mLs by mouth 4 (four) times daily as needed for cough.   No facility-administered encounter medications on file as of 05/15/2022.     Review of Systems  Review of Systems  N/a Physical Exam  BP 132/72 (BP Location: Left Arm, Patient Position: Sitting, Cuff Size: Normal)   Pulse 81   Wt 257 lb (116.6 kg)   LMP 04/11/2022 Comment: shielded  SpO2 98%   BMI 42.77 kg/m   Wt Readings from Last 5 Encounters:  05/15/22 257 lb (116.6 kg)  04/30/22 249 lb (112.9 kg)  04/17/22 250 lb 12.8 oz (113.8 kg)  04/14/22 250 lb (113.4 kg)  03/26/22 251 lb 9.6 oz (114.1 kg)    BMI Readings from Last 5 Encounters:  05/15/22 42.77 kg/m  04/30/22 41.44 kg/m  04/17/22 41.74 kg/m  04/14/22 41.60 kg/m  03/26/22 41.87 kg/m     Physical Exam General: Well-appearing, no acute distress Eyes: EOMI, no icterus Neck: Supple, no JVP Pulmonary: Clear, normal work of breathing Cardiovascular: Warm, no edema Abdomen: Nondistended, bowel sounds present MSK: Normal joints, no effusion, no synovitis  Neuro: Normal gait, no weakness Psych: Normal mood, full affect   Assessment & Plan:   Dyspnea on exertion: Fear related to poorly controlled asthma.  Initially improved with ICS/LABA therapy.  Other considerations include deconditioning.  Now with persistent endobronchial nodules and mild lymphadenopathy possible sarcoidosis as cause.  Cough: Possible related to poorly controlled asthma with some interval improvement with ICS/LABA.Marland Kitchen  Also with postnasal drip likely contributing.  Unfortunate despite high-dose ICS/LABA therapy with mild improvement initially symptoms have worsened.  Did not respond well to aggressive intranasal regimen.  Possibly to sarcoidosis although typically ICS/LABA is a very effective  treatment for sarcoidosis cough.  Continue all current treatments.  Promethazine cough syrup is been helpful in treating symptoms.  Scattered endobronchial nodules, groundglass: Possible sarcoidosis with some lymphadenopathy.  Could be contribute to her symptoms and cough has not responded well to inhaled therapies and intranasal therapies.  After discussion, recommend EBUS and endobronchial plus or minus transbronchial biopsies for evaluation of sarcoidosis.  She expressed understanding would like to move forward given persistent symptoms that have not improved despite aggressive intranasal inhalational therapies.   Return in about 2 months (around 07/15/2022).   Karren Burly, MD 05/15/2022  I spent 45 minutes in the care of the patient including review of records, coordination of care, face-to-face visit.

## 2022-05-15 NOTE — Patient Instructions (Addendum)
Nice to see you  What we saw on the biopsies with inflammation.  We do not have any evidence of sarcoid at this time.  To further treat this and the cough, take prednisone as prescribed, starting with 20 mg a day for 7 days then 15 mg for 7 days, then 10 mg for 7 days, then 5 mg for 7 days.  The instructions should be on the prescription as well.  In addition, I prescribed antibiotic, azithromycin.  Take 2 tablets on day 1, then 1 tablet once a day on days 2 through 5.  Return to clinic in 2 months or sooner as needed with Dr. Silas Flood.

## 2022-05-22 ENCOUNTER — Other Ambulatory Visit (HOSPITAL_COMMUNITY): Payer: Self-pay

## 2022-05-22 DIAGNOSIS — E669 Obesity, unspecified: Secondary | ICD-10-CM | POA: Diagnosis not present

## 2022-05-22 MED ORDER — WEGOVY 1.7 MG/0.75ML ~~LOC~~ SOAJ
1.7000 mg | SUBCUTANEOUS | 0 refills | Status: DC
  Filled 2022-05-22: qty 3, 28d supply, fill #0

## 2022-05-22 MED ORDER — WEGOVY 2.4 MG/0.75ML ~~LOC~~ SOAJ
2.4000 mg | SUBCUTANEOUS | 6 refills | Status: DC
  Filled 2022-05-22: qty 3, 28d supply, fill #0

## 2022-05-22 MED ORDER — WEGOVY 1 MG/0.5ML ~~LOC~~ SOAJ
1.0000 mg | SUBCUTANEOUS | 0 refills | Status: DC
  Filled 2022-05-22 – 2022-06-11 (×2): qty 2, 28d supply, fill #0

## 2022-05-23 NOTE — Telephone Encounter (Signed)
Yes thanks 

## 2022-06-11 ENCOUNTER — Other Ambulatory Visit (HOSPITAL_COMMUNITY): Payer: Self-pay

## 2022-06-26 ENCOUNTER — Other Ambulatory Visit: Payer: Self-pay

## 2022-07-17 ENCOUNTER — Other Ambulatory Visit (HOSPITAL_COMMUNITY): Payer: Self-pay

## 2022-07-23 ENCOUNTER — Ambulatory Visit: Payer: BC Managed Care – PPO | Admitting: Pulmonary Disease

## 2022-07-23 ENCOUNTER — Encounter: Payer: Self-pay | Admitting: Pulmonary Disease

## 2022-07-23 ENCOUNTER — Other Ambulatory Visit (HOSPITAL_COMMUNITY): Payer: Self-pay

## 2022-07-25 ENCOUNTER — Other Ambulatory Visit (HOSPITAL_COMMUNITY): Payer: Self-pay

## 2022-07-30 ENCOUNTER — Other Ambulatory Visit (HOSPITAL_COMMUNITY): Payer: Self-pay

## 2022-07-31 ENCOUNTER — Other Ambulatory Visit (HOSPITAL_COMMUNITY): Payer: Self-pay

## 2022-08-16 ENCOUNTER — Ambulatory Visit: Payer: BC Managed Care – PPO | Admitting: Pulmonary Disease

## 2022-08-24 ENCOUNTER — Ambulatory Visit: Payer: BC Managed Care – PPO | Admitting: Adult Health

## 2022-08-27 ENCOUNTER — Ambulatory Visit: Payer: BC Managed Care – PPO | Admitting: Adult Health

## 2022-08-27 ENCOUNTER — Encounter: Payer: Self-pay | Admitting: Adult Health

## 2022-08-27 VITALS — BP 138/88 | HR 82 | Temp 97.7°F | Ht 65.0 in | Wt 265.6 lb

## 2022-08-27 DIAGNOSIS — J4540 Moderate persistent asthma, uncomplicated: Secondary | ICD-10-CM | POA: Diagnosis not present

## 2022-08-27 DIAGNOSIS — R9389 Abnormal findings on diagnostic imaging of other specified body structures: Secondary | ICD-10-CM | POA: Diagnosis not present

## 2022-08-27 DIAGNOSIS — R053 Chronic cough: Secondary | ICD-10-CM

## 2022-08-27 DIAGNOSIS — J45909 Unspecified asthma, uncomplicated: Secondary | ICD-10-CM | POA: Insufficient documentation

## 2022-08-27 MED ORDER — BENZONATATE 200 MG PO CAPS: 200.0000 mg | ORAL_CAPSULE | Freq: Three times a day (TID) | ORAL | 3 refills | Status: DC | PRN

## 2022-08-27 NOTE — Assessment & Plan Note (Addendum)
Peribronchovascular and groundglass nodularity with interlobular septal thickening questionable etiology. EBUS and bronchoscopy showed acute on chronic inflammation on pathology.  Cytology negative for granulomatous inflammation.  Likely not sarcoidosis. Check autoimmune/connective tissue serology.  Check PFTs on return.  If DLCO is decreased consider high-resolution CT chest. Checking hypersensitivity pneumonitis panel.  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  Have discussed with her if job change is an option.  Plan  Patient Instructions  Continue on Breztri 2 puffs twice daily, rinse after use. Begin Delsym 2 teaspoons twice daily for cough as needed Tessalon Perles 3 times daily for cough as needed Begin Allegra 180 mg daily in a.m. Begin Chlorpheniramine 46m At bedtime   May use Chlorpheniramine 476mevery 4hr as needed for cough, postnasal drainage, throat clearing-use with caution as may make you sleepy Begin Pepcid 20 mg twice daily Sips of water to help soothe the throat to avoid coughing and throat clearing Avoid mint products Labs today Follow-up in 6 weeks with Dr. HuSilas Floodith PFTs and as needed

## 2022-08-27 NOTE — Assessment & Plan Note (Addendum)
Chronic cough x 1 year questionable etiology.  Patient has had an extensive workup with CT chest, bronchoscopy.  No granulomatous inflammation noted so most likely not sarcoidosis.  She does have peribronchial and groundglass nodularity questionable etiology.  Patient also has a significant occupational exposure.  Also history of heterotaxy syndrome (abdominal organs) .  No significant bronchiectasis noted on CT chest.  Check PFTs on return.  If DLCO is decreased consider high-resolution CT chest to rule out underlying interstitial lung process.  Check autoimmune labs today. Begin aggressive cough suppression regimen to help with better symptom management If unable to get cough under control consider adding gabapentin low-dose.  Plan  Patient Instructions  Continue on Breztri 2 puffs twice daily, rinse after use. Begin Delsym 2 teaspoons twice daily for cough as needed Tessalon Perles 3 times daily for cough as needed Begin Allegra 180 mg daily in a.m. Begin Chlorpheniramine 23m At bedtime   May use Chlorpheniramine 423mevery 4hr as needed for cough, postnasal drainage, throat clearing-use with caution as may make you sleepy Begin Pepcid 20 mg twice daily Sips of water to help soothe the throat to avoid coughing and throat clearing Avoid mint products Labs today Follow-up in 6 weeks with Dr. HuSilas Floodith PFTs and as needed

## 2022-08-27 NOTE — Assessment & Plan Note (Signed)
Possible moderate persistent asthma-uncontrolled symptoms -chronic cough .  Check PFTs on return.  Continue on triple therapy maintenance regimen.  Control for triggers. Check IgE   Plan  Patient Instructions  Continue on Breztri 2 puffs twice daily, rinse after use. Begin Delsym 2 teaspoons twice daily for cough as needed Tessalon Perles 3 times daily for cough as needed Begin Allegra 180 mg daily in a.m. Begin Chlorpheniramine 45m At bedtime   May use Chlorpheniramine 475mevery 4hr as needed for cough, postnasal drainage, throat clearing-use with caution as may make you sleepy Begin Pepcid 20 mg twice daily Sips of water to help soothe the throat to avoid coughing and throat clearing Avoid mint products Labs today Follow-up in 6 weeks with Dr. HuSilas Floodith PFTs and as needed

## 2022-08-27 NOTE — Progress Notes (Signed)
Error

## 2022-08-27 NOTE — Patient Instructions (Addendum)
Continue on Breztri 2 puffs twice daily, rinse after use. Begin Delsym 2 teaspoons twice daily for cough as needed Tessalon Perles 3 times daily for cough as needed Begin Allegra 180 mg daily in a.m. Begin Chlorpheniramine 29m At bedtime   May use Chlorpheniramine 474mevery 4hr as needed for cough, postnasal drainage, throat clearing-use with caution as may make you sleepy Begin Pepcid 20 mg twice daily Sips of water to help soothe the throat to avoid coughing and throat clearing Avoid mint products Labs today Follow-up in 6 weeks with Dr. HuSilas Floodith PFTs and as needed

## 2022-08-27 NOTE — Progress Notes (Signed)
@Patient  ID: Katie Knight, female    DOB: [birth year], 47 y.o.   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 Stomach and spleen/splenules are in the right upper quadrant. 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 No evidence for an obstruction. 3. Multiple nodular foci within the bilateral lung bases, lingula and right middle lobe consistent with respiratory infection, possible atypical pneumonia. 4 mm left lung base nodule.  Echo October 2019 EF at 55 to 60%, mild tricuspid valve regurg, trivial mitral valve regurg, mild calcified aortic valve leaflets.  04/2022 Bronch/EBUS -pathology acute on chronic inflammation, no granulomatous inflammation., BAL neg   08/27/2022 Follow up ; Asthma, Cough , Lung nodularity  Patient returns for a 76-month follow-up.  Patient has been having difficulty over the last year with chronic cough.  She describes the cough as severe at times to the point where she can vomit.  She has been tried on multiple regimens including antibiotics and steroids.  She has been treated for difficult to control asthma and is on triple therapy maintenance regimen.  Patient says she is prone to pneumonia.  She has a history of heterotaxy syndrome.  Says she usually gets pneumonia at least once or twice yearly.  She says she has had COVID-19 infection twice and each  time she got pneumonia.  Patient has had an extensive workup with CT chest in September showing peribronchovascular and lower lobe predominant groundglass nodularity.  She underwent bronchoscopy and EBUS that showed no granulomatous inflammation.  BAL was negative.  Pathology showed acute on chronic inflammation.  At last visit she was given a course of Zithromax and prednisone, and continued Breztri.  She says she seen no significant improvement.  She was out of work for a few weeks she did see some slight improvement while away from work.  She works at a [employer] for the last 16 years.  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  She does wear a respirator mask that she started wearing in November when she returned back to work after bronchoscopy.  She says it really has not made any difference.  When she leaves work she is always covered in a fine black dust.  She says she has some mild joint pains in her lower legs but no swelling.  No rash.  She does have dry mouth at times.  She has no family history of autoimmune disorder.  She has no unusual hobbies, no pets.  No exposure to birds or chickens.  No basement or hot tub. Cough is minimally productive.  No hemoptysis or fever.  No discolored mucus.      No Known Allergies   There is no immunization history on file for this patient.  Past Medical History:  Diagnosis Date   Asthma    Hyperlipidemia     Tobacco History: Social History   Tobacco Use  Smoking Status Never   Passive exposure: Past  Smokeless Tobacco Never  Counseling given: Not Answered   Outpatient Medications Prior to Visit  Medication Sig Dispense Refill   Budeson-Glycopyrrol-Formoterol (BREZTRI AEROSPHERE) 160-9-4.8 MCG/ACT AERO Inhale 2 puffs into the lungs in the morning and at bedtime. (Patient taking differently: Inhale 1 puff into the lungs daily. Additional puff if needed during the day) 1 each 2    budesonide-formoterol (SYMBICORT) 160-4.5 MCG/ACT inhaler Inhale 2 puffs into the lungs daily as needed (if don't  have other inhaler).     fluticasone (FLONASE) 50 MCG/ACT nasal spray Place 1 spray into both nostrils 2 (two) times daily. 16 g 2   LINZESS 145 MCG CAPS capsule Take 145 mcg by mouth daily.     Multiple Vitamins-Minerals (MULTIVITAMIN WITH MINERALS) tablet Take 1 tablet by mouth daily. gummy     oxymetazoline (AFRIN) 0.05 % nasal spray Place 1 spray into both nostrils daily as needed for congestion.     pantoprazole (PROTONIX) 20 MG tablet Take 1 tablet (20 mg total) by mouth daily. 30 tablet 0   PROAIR HFA 108 (90 Base) MCG/ACT inhaler Inhale 2 puffs into the lungs every 4 (four) hours as needed for wheezing or shortness of breath. 6.7 each 6   promethazine-dextromethorphan (PROMETHAZINE-DM) 6.25-15 MG/5ML syrup Take 2.5 mLs by mouth 4 (four) times daily as needed for cough. (Patient not taking: Reported on 08/27/2022) 118 mL 0   Semaglutide-Weight Management (WEGOVY) 1 MG/0.5ML SOAJ Inject 1 mg into the skin once a week for 4 weeks, then increase to 1.7mg . (Patient not taking: Reported on 08/27/2022) 2 mL 0   Semaglutide-Weight Management (WEGOVY) 1.7 MG/0.75ML SOAJ Inject 1.7 mg into the skin once a week for 4 weeks, then increase to 2.4mg . (Patient not taking: Reported on 08/27/2022) 3 mL 0   Semaglutide-Weight Management (WEGOVY) 2.4 MG/0.75ML SOAJ Inject 2.4 mg into the skin once a week. (Patient not taking: Reported on 08/27/2022) 3 mL 6   benzonatate (TESSALON) 200 MG capsule Take 1 capsule (200 mg total) by mouth 3 (three) times daily as needed for cough. (Patient not taking: Reported on 08/27/2022) 30 capsule 2   No facility-administered medications prior to visit.     Review of Systems:   Constitutional:   No  weight loss, night sweats,  Fevers, chills, fatigue, or  lassitude.  HEENT:   No headaches,  Difficulty swallowing,  Tooth/dental problems, or  Sore throat,                 No sneezing, itching, ear ache, nasal congestion, post nasal drip,   CV:  No chest pain,  Orthopnea, PND, swelling in lower extremities, anasarca, dizziness, palpitations, syncope.   GI  No heartburn, indigestion, abdominal pain, nausea, vomiting, diarrhea, change in bowel habits, loss of appetite, bloody stools.   Resp: .  No chest wall deformity  Skin: no rash or lesions.  GU: no dysuria, change in color of urine, no urgency or frequency.  No flank pain, no hematuria   MS:  No joint pain or swelling.  No decreased range of motion.  No back pain.    Physical Exam  BP 138/88 (BP Location: Left Arm, Patient Position: Sitting, Cuff Size: Large)   Pulse 82   Temp 97.7 F (36.5 C) (Oral)   Ht 5\' 5"  (1.651 m)   Wt 265 lb 9.6 oz (120.5 kg)   SpO2 100%   BMI 44.20 kg/m   GEN: A/Ox3; pleasant , NAD, well nourished    HEENT:  [place]/AT,   NOSE-clear, THROAT-clear, no lesions,  no postnasal drip or exudate noted.   NECK:  Supple w/ fair ROM; no JVD; normal carotid impulses w/o bruits; no thyromegaly or nodules palpated; no lymphadenopathy.    RESP  Clear  P & A; w/o, wheezes/ rales/ or rhonchi. no accessory muscle use, no dullness to percussion  CARD:  RRR, no m/r/g, no peripheral edema, pulses intact, no cyanosis or clubbing.  GI:   Soft & nt; nml bowel sounds; no organomegaly or masses detected.   Musco: Warm bil, no deformities or joint swelling noted.   Neuro: alert, no focal deficits noted.    Skin: Warm, no lesions or rashes    Lab Results:  CBC    Component Value Date/Time   WBC 4.4 04/14/2022 1607   RBC 3.68 (L) 04/14/2022 1607   HGB 11.0 (L) 04/14/2022 1607   HCT 34.0 (L) 04/14/2022 1607   PLT 375 04/14/2022 1607   MCV 92.4 04/14/2022 1607   MCH 29.9 04/14/2022 1607   MCHC 32.4 04/14/2022 1607   RDW 12.5 04/14/2022 1607   LYMPHSABS 1.2 04/14/2022 1607   MONOABS 0.5 04/14/2022 1607   EOSABS 0.2 04/14/2022 1607   BASOSABS 0.0 04/14/2022 1607    BMET     Component Value Date/Time   NA 138 04/14/2022 1607   K 3.8 04/14/2022 1607   CL 104 04/14/2022 1607   CO2 25 04/14/2022 1607   GLUCOSE 86 04/14/2022 1607   BUN 13 04/14/2022 1607   CREATININE 0.89 04/14/2022 1607   CALCIUM 8.8 (L) 04/14/2022 1607   GFRNONAA >60 04/14/2022 1607   GFRAA >60 02/06/2018 2019    BNP No results found for: "BNP"  ProBNP No results found for: "PROBNP"  Imaging: No results found.        No data to display          No results found for: "NITRICOXIDE"      Assessment & Plan:   Asthma Possible moderate persistent asthma-uncontrolled symptoms -chronic cough .  Check PFTs on return.  Continue on triple therapy maintenance regimen.  Control for triggers. Check IgE   Plan  Patient Instructions  Continue on Breztri 2 puffs twice daily, rinse after use. Begin Delsym 2 teaspoons twice daily for cough as needed Tessalon Perles 3 times daily for cough as needed Begin Allegra 180 mg daily in a.m. Begin Chlorpheniramine 4mg  At bedtime   May use Chlorpheniramine 4mg  every 4hr as needed for cough, postnasal drainage, throat clearing-use with caution as may make you sleepy Begin Pepcid 20 mg twice daily Sips of water to help soothe the throat to avoid coughing and throat clearing Avoid mint products Labs today Follow-up in 6 weeks with Dr. Judeth Horn with PFTs and as needed    Chronic cough Chronic cough x 1 year questionable etiology.  Patient has had an extensive workup with CT chest, bronchoscopy.  No granulomatous inflammation noted so most likely not sarcoidosis.  She does have peribronchial and groundglass nodularity questionable etiology.  Patient also has a significant occupational exposure.  Also history of heterotaxy syndrome (abdominal organs) .  No significant bronchiectasis noted on CT chest.  Check PFTs on return.  If DLCO is decreased consider high-resolution CT chest to rule out underlying interstitial lung process.  Check autoimmune  labs today. Begin aggressive cough suppression regimen to help with better symptom management If unable to get cough under control consider adding gabapentin low-dose.  Plan  Patient Instructions  Continue on Breztri 2 puffs twice daily, rinse after use. Begin  Delsym 2 teaspoons twice daily for cough as needed Tessalon Perles 3 times daily for cough as needed Begin Allegra 180 mg daily in a.m. Begin Chlorpheniramine 4mg  At bedtime   May use Chlorpheniramine 4mg  every 4hr as needed for cough, postnasal drainage, throat clearing-use with caution as may make you sleepy Begin Pepcid 20 mg twice daily Sips of water to help soothe the throat to avoid coughing and throat clearing Avoid mint products Labs today Follow-up in 6 weeks with Dr. Judeth Horn with PFTs and as needed    Abnormal CT of the chest Peribronchovascular and groundglass nodularity with interlobular septal thickening questionable etiology. EBUS and bronchoscopy showed acute on chronic inflammation on pathology.  Cytology negative for granulomatous inflammation.  Likely not sarcoidosis. Check autoimmune/connective tissue serology.  Check PFTs on return.  If DLCO is decreased consider high-resolution CT chest. Checking hypersensitivity pneumonitis panel.  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  Have discussed with her if job change is an option.  Plan  Patient Instructions  Continue on Breztri 2 puffs twice daily, rinse after use. Begin Delsym 2 teaspoons twice daily for cough as needed Tessalon Perles 3 times daily for cough as needed Begin Allegra 180 mg daily in a.m. Begin Chlorpheniramine 4mg  At bedtime   May use Chlorpheniramine 4mg  every 4hr as needed for cough, postnasal drainage, throat clearing-use with caution as may make you sleepy Begin Pepcid 20 mg twice daily Sips of water to help soothe the throat to avoid coughing and throat clearing Avoid mint  products Labs today Follow-up in 6 weeks with Dr. Judeth Horn with PFTs and as needed     I spent   45 minutes dedicated to the care of this patient on the date of this encounter to include pre-visit review of records, face-to-face time with the patient discussing conditions above, post visit ordering of testing, clinical documentation with the electronic health record, making appropriate referrals as documented, and communicating necessary findings to members of the patients care team.   Rubye Oaks, NP 08/27/2022

## 2022-08-27 NOTE — Progress Notes (Signed)
Received the first 2 Pfizer vaccines

## 2022-08-29 LAB — ANTI-NUCLEAR AB-TITER (ANA TITER): ANA Titer 1: 1:320 {titer} — ABNORMAL HIGH

## 2022-08-29 LAB — SJOGRENS SYNDROME-A EXTRACTABLE NUCLEAR ANTIBODY: SSA (Ro) (ENA) Antibody, IgG: <1 AI

## 2022-08-29 LAB — IGE: IgE (Immunoglobulin E), Serum: 51 kU/L (ref <=114)

## 2022-08-29 LAB — RHEUMATOID FACTOR: Rheumatoid fact SerPl-aCnc: <14 IU/mL (ref <14)

## 2022-08-29 LAB — CYCLIC CITRUL PEPTIDE ANTIBODY, IGG: Cyclic Citrullin Peptide Ab: <16 UNITS

## 2022-08-29 LAB — ANA: Anti Nuclear Antibody (ANA): POSITIVE — AB

## 2022-08-31 ENCOUNTER — Ambulatory Visit: Payer: BC Managed Care – PPO | Admitting: Pulmonary Disease

## 2022-09-01 LAB — HYPERSENSITIVITY PNEUMONITIS
A. Pullulans Abs: NEGATIVE
A.Fumigatus #1 Abs: NEGATIVE
Micropolyspora faeni, IgG: NEGATIVE
Pigeon Serum Abs: NEGATIVE
Thermoact. Saccharii: NEGATIVE
Thermoactinomyces vulgaris, IgG: NEGATIVE

## 2022-09-01 LAB — SJOGRENS SYNDROME-B EXTRACTABLE NUCLEAR ANTIBODY: ENA SSB (LA) Ab: <0.2 AI (ref 0.0–0.9)

## 2022-09-05 ENCOUNTER — Encounter: Payer: Self-pay | Admitting: Pulmonary Disease

## 2022-09-05 DIAGNOSIS — R768 Other specified abnormal immunological findings in serum: Secondary | ICD-10-CM

## 2022-09-05 NOTE — Telephone Encounter (Signed)
Dr. Hunsucker, please see mychart message sent by pt and advise. 

## 2022-09-06 NOTE — Telephone Encounter (Signed)
Dr. Silas Flood, please advise on pt's message regarding follow up testing. Thanks.

## 2022-09-07 ENCOUNTER — Other Ambulatory Visit: Payer: Self-pay | Admitting: *Deleted

## 2022-09-07 NOTE — Progress Notes (Signed)
ATC x1.  LVM to return call.  Referral made to rheumatology.  Will attempt to reach again on Monday.

## 2022-09-07 NOTE — Progress Notes (Signed)
Referral already placed by Dr. Silas Flood.

## 2022-09-10 ENCOUNTER — Telehealth: Payer: Self-pay | Admitting: Pulmonary Disease

## 2022-09-10 NOTE — Progress Notes (Signed)
Called and spoke with patient, advised of results/recommendations per Rexene Edison NP.  Also advised that the Rheumatology referral has already been placed by Dr. Silas Flood.  She verbalized Hunsucker.  She does have a f/u scheduled with Dr. Silas Flood on 10/22/22 with a PFT.  Nothing further needed.

## 2022-09-10 NOTE — Telephone Encounter (Signed)
PT ret call from last week from [place]. Missed call. Regards test results. Pls call back @ [phone]

## 2022-09-10 NOTE — Telephone Encounter (Signed)
Called and left voicemail for patient to call office back  

## 2022-09-11 NOTE — Telephone Encounter (Signed)
Called and spoke with patient. Patient stated that someone had already spoke with her about her results.   Nothing further needed.

## 2022-10-19 ENCOUNTER — Other Ambulatory Visit: Payer: Self-pay

## 2022-10-19 DIAGNOSIS — J4540 Moderate persistent asthma, uncomplicated: Secondary | ICD-10-CM

## 2022-10-19 DIAGNOSIS — R9389 Abnormal findings on diagnostic imaging of other specified body structures: Secondary | ICD-10-CM

## 2022-10-22 ENCOUNTER — Encounter: Payer: Self-pay | Admitting: Pulmonary Disease

## 2022-10-22 ENCOUNTER — Ambulatory Visit: Payer: BC Managed Care – PPO | Admitting: Pulmonary Disease

## 2022-10-22 ENCOUNTER — Ambulatory Visit (INDEPENDENT_AMBULATORY_CARE_PROVIDER_SITE_OTHER): Payer: BC Managed Care – PPO | Admitting: Pulmonary Disease

## 2022-10-22 VITALS — BP 130/74 | HR 71 | Ht 66.0 in | Wt 264.0 lb

## 2022-10-22 DIAGNOSIS — J4540 Moderate persistent asthma, uncomplicated: Secondary | ICD-10-CM | POA: Diagnosis not present

## 2022-10-22 DIAGNOSIS — R9389 Abnormal findings on diagnostic imaging of other specified body structures: Secondary | ICD-10-CM

## 2022-10-22 LAB — PULMONARY FUNCTION TEST
DL/VA % pred: 103 %
DL/VA: 4.47 ml/min/mmHg/L
DLCO cor % pred: 75 %
DLCO cor: 17.48 ml/min/mmHg
DLCO unc % pred: 75 %
DLCO unc: 17.48 ml/min/mmHg
FEF 25-75 Post: 2.17 L/sec
FEF 25-75 Pre: 2.09 L/sec
FEF2575-%Change-Post: 3 %
FEF2575-%Pred-Post: 69 %
FEF2575-%Pred-Pre: 67 %
FEV1-%Change-Post: 0 %
FEV1-%Pred-Post: 69 %
FEV1-%Pred-Pre: 69 %
FEV1-Post: 2.19 L
FEV1-Pre: 2.17 L
FEV1FVC-%Change-Post: 7 %
FEV1FVC-%Pred-Pre: 98 %
FEV6-%Change-Post: -5 %
FEV6-%Pred-Post: 67 %
FEV6-%Pred-Pre: 71 %
FEV6-Post: 2.55 L
FEV6-Pre: 2.71 L
FEV6FVC-%Change-Post: 0 %
FEV6FVC-%Pred-Post: 102 %
FEV6FVC-%Pred-Pre: 102 %
FVC-%Change-Post: -5 %
FVC-%Pred-Post: 65 %
FVC-%Pred-Pre: 69 %
FVC-Post: 2.55 L
FVC-Pre: 2.72 L
Post FEV1/FVC ratio: 86 %
Post FEV6/FVC ratio: 100 %
Pre FEV1/FVC ratio: 80 %
Pre FEV6/FVC Ratio: 100 %
RV % pred: 74 %
RV: 1.31 L
TLC % pred: 74 %
TLC: 4.01 L

## 2022-10-22 NOTE — Patient Instructions (Signed)
Full PFT performed today. °

## 2022-10-22 NOTE — Patient Instructions (Signed)
Nice to see you again  Continue all the medications you are currently taking  Will start a new medication to treat cough which could be coming from asthma.  This is called Tezspire.  It is an injection.  Will fill out paperwork today and start the process to get this approved by your insurance.  Will give the first dose here in the office, we will schedule it once everything is approved.  Return to clinic in 3 months or sooner as needed with Dr. Judeth Horn

## 2022-10-22 NOTE — Progress Notes (Signed)
@Patient  ID: Katie Knight, female    DOB: 10/04/1975, 47 y.o.   MRN: [id number]  Chief Complaint  Patient presents with   Follow-up    Follow up for asthma. Pt had full pft today. Pt is on symbicort and she states that it seems to work ok.     Referring provider: [name], MD  HPI:   47 y.o. woman whom we are seeing in follow up for evaluation of cough, dyspnea on exertion, asthma. Most recent pulmonary note from Rubye Oaks NP reviewed.   Overall, things have been improved.  Has some ongoing cough.  Some days was worse.  In the preceding months.  Has been bad every day consistently.  She continues good adherence to medications prescribed.  Discussed role and rationale for increased asthma therapies in future.  HPI at initial visit: Noted shortness of breath for the last several months.  With exertion.  Worse on inclines or stairs.  Not much history at rest.  No time of day when things are better or worse.  No position make things better or worse.  Recent prescribed albuterol with mild improvement in symptoms.  No other alleviating or exacerbating factors.  Associated symptoms of cough.  Seems worse in the morning.  Mildly productive.  Not much of an issue today.  Endorses nasal congestion, postnasal drip.  Denies reflux.  Work-up today includes EKG treadmill stress test that was normal 12/2021.  Most recent chest image seen/x-ray 12/27/2021 personally reviewed and interpreted as tenting of the right diaphragm with linear infiltrates most consistent with atelectasis given evidence of volume loss, reviewed reported concern for pneumonia.  Reviewed TTE 2019 that is largely normal.  Reviewed CT chest 02/2018 revealed scattered diffuse peribronchovascular groundglass nodules on my interpretation.  PMH: Asthma Surgical history: D&C Family history: Father with hypertension Social history: Never smoker, lives in [facility]  Questionaires / Pulmonary Flowsheets:    ACT:  Asthma Control Test ACT Total Score  03/26/2022  9:55 AM 15  01/19/2022  3:41 PM 18    MMRC:     No data to display          Epworth:      No data to display          Tests:   FENO:  No results found for: "NITRICOXIDE"  PFT:    Latest Ref Rng & Units 10/19/2022   11:38 AM  PFT Results  FVC-Pre L 2.72  P  FVC-Predicted Pre % 69  P  FVC-Post L 2.55  P  FVC-Predicted Post % 65  P  Pre FEV1/FVC % % 80  P  Post FEV1/FCV % % 86  P  FEV1-Pre L 2.17  P  FEV1-Predicted Pre % 69  P  FEV1-Post L 2.19  P  DLCO uncorrected ml/min/mmHg 17.48  P  DLCO UNC% % 75  P  DLCO corrected ml/min/mmHg 17.48  P  DLCO COR %Predicted % 75  P  DLVA Predicted % 103  P  TLC L 4.01  P  TLC % Predicted % 74  P  RV % Predicted % 74  P    P Preliminary result    WALK:      No data to display          Imaging: Personally reviewed and as per EMR discussion this note No results found.  Lab Results: Personally reviewed CBC    Component Value Date/Time   WBC 4.4 04/14/2022 1607   RBC 3.68 (  L) 04/14/2022 1607   HGB 11.0 (L) 04/14/2022 1607   HCT 34.0 (L) 04/14/2022 1607   PLT 375 04/14/2022 1607   MCV 92.4 04/14/2022 1607   MCH 29.9 04/14/2022 1607   MCHC 32.4 04/14/2022 1607   RDW 12.5 04/14/2022 1607   LYMPHSABS 1.2 04/14/2022 1607   MONOABS 0.5 04/14/2022 1607   EOSABS 0.2 04/14/2022 1607   BASOSABS 0.0 04/14/2022 1607    BMET    Component Value Date/Time   NA 138 04/14/2022 1607   K 3.8 04/14/2022 1607   CL 104 04/14/2022 1607   CO2 25 04/14/2022 1607   GLUCOSE 86 04/14/2022 1607   BUN 13 04/14/2022 1607   CREATININE 0.89 04/14/2022 1607   CALCIUM 8.8 (L) 04/14/2022 1607   GFRNONAA >60 04/14/2022 1607   GFRAA >60 02/06/2018 2019    BNP No results found for: "BNP"  ProBNP No results found for: "PROBNP"  Specialty Problems       Pulmonary Problems   Asthma   Chronic cough     No Known Allergies   There is no immunization history on  file for this patient.  Past Medical History:  Diagnosis Date   Asthma    Hyperlipidemia     Tobacco History: Social History   Tobacco Use  Smoking Status Never   Passive exposure: Past  Smokeless Tobacco Never   Counseling given: Not Answered   Continue to not smoke  Outpatient Encounter Medications as of 10/22/2022  Medication Sig   budesonide-formoterol (SYMBICORT) 160-4.5 MCG/ACT inhaler Inhale 2 puffs into the lungs daily as needed (if don't  have other inhaler).   fluticasone (FLONASE) 50 MCG/ACT nasal spray Place 1 spray into both nostrils 2 (two) times daily.   LINZESS 145 MCG CAPS capsule Take 145 mcg by mouth daily.   Multiple Vitamins-Minerals (MULTIVITAMIN WITH MINERALS) tablet Take 1 tablet by mouth daily. gummy   oxymetazoline (AFRIN) 0.05 % nasal spray Place 1 spray into both nostrils daily as needed for congestion.   pantoprazole (PROTONIX) 20 MG tablet Take 1 tablet (20 mg total) by mouth daily.   PROAIR HFA 108 (90 Base) MCG/ACT inhaler Inhale 2 puffs into the lungs every 4 (four) hours as needed for wheezing or shortness of breath.   promethazine-dextromethorphan (PROMETHAZINE-DM) 6.25-15 MG/5ML syrup Take 2.5 mLs by mouth 4 (four) times daily as needed for cough.   [DISCONTINUED] Budeson-Glycopyrrol-Formoterol (BREZTRI AEROSPHERE) 160-9-4.8 MCG/ACT AERO Inhale 2 puffs into the lungs in the morning and at bedtime. (Patient taking differently: Inhale 1 puff into the lungs daily. Additional puff if needed during the day)   [DISCONTINUED] benzonatate (TESSALON) 200 MG capsule Take 1 capsule (200 mg total) by mouth 3 (three) times daily as needed for cough.   [DISCONTINUED] Semaglutide-Weight Management (WEGOVY) 1 MG/0.5ML SOAJ Inject 1 mg into the skin once a week for 4 weeks, then increase to 1.7mg . (Patient not taking: Reported on 08/27/2022)   [DISCONTINUED] Semaglutide-Weight Management (WEGOVY) 1.7 MG/0.75ML SOAJ Inject 1.7 mg into the skin once a week for 4 weeks,  then increase to 2.4mg . (Patient not taking: Reported on 08/27/2022)   [DISCONTINUED] Semaglutide-Weight Management (WEGOVY) 2.4 MG/0.75ML SOAJ Inject 2.4 mg into the skin once a week. (Patient not taking: Reported on 08/27/2022)   No facility-administered encounter medications on file as of 10/22/2022.     Review of Systems  Review of Systems  N/a Physical Exam  BP 130/74 (BP Location: Left Arm, Patient Position: Sitting, Cuff Size: Normal)   Pulse 71  Ht 5\' 6"  (1.676 m)   Wt 264 lb (119.7 kg)   SpO2 99%   BMI 42.61 kg/m   Wt Readings from Last 5 Encounters:  10/22/22 264 lb (119.7 kg)  08/27/22 265 lb 9.6 oz (120.5 kg)  05/15/22 257 lb (116.6 kg)  04/30/22 249 lb (112.9 kg)  04/17/22 250 lb 12.8 oz (113.8 kg)    BMI Readings from Last 5 Encounters:  10/22/22 42.61 kg/m  08/27/22 44.20 kg/m  05/15/22 42.77 kg/m  04/30/22 41.44 kg/m  04/17/22 41.74 kg/m     Physical Exam General: Well-appearing, no acute distress Eyes: EOMI, no icterus Neck: Supple, no JVP Pulmonary: Clear, normal work of breathing Cardiovascular: Warm, no edema Abdomen: Nondistended, bowel sounds present MSK: Normal joints, no effusion, no synovitis  Neuro: Normal gait, no weakness Psych: Normal mood, full affect   Assessment & Plan:   Dyspnea on exertion: Fear related to poorly controlled asthma.  Initially improved with ICS/LABA therapy.  Other considerations include deconditioning.  Now with persistent endobronchial nodules and mild lymphadenopathy possible sarcoidosis as cause.  Bronchoscopy with EBUS 04/2022 without any evidence of sarcoidosis on biopsy nor suggestive on cell count.  PFT 10/2022 with mild restriction and mild reduced DLCO.  Cough: Possible related to poorly controlled asthma with some interval improvement with ICS/LABA.Marland Kitchen  Also with postnasal drip likely contributing.  Unfortunate despite high-dose ICS/LABA therapy with mild improvement initially symptoms have worsened.   Did not respond well to aggressive intranasal regimen.  Possibly to sarcoidosis although typically ICS/LABA is a very effective treatment for sarcoidosis cough.  Some improvement with steroids in pat. Start Tezspire, for uncontrolled cough variant asthma, continue ICS/LABA.  Scattered endobronchial nodules, groundglass: Possible sarcoidosis with some lymphadenopathy.  Could be contribute to her symptoms and cough has not responded well to inhaled therapies and intranasal therapies.  Size of lymph nodes had shrunk at time of EBUS favoring more reactive lymphadenopathy.  No evidence of sarcoidosis on bronchoscopy studies.     Return in about 3 months (around 01/21/2023).   Karren Burly, MD 10/23/2022

## 2022-10-22 NOTE — Progress Notes (Signed)
Full PFT performed today. °

## 2022-10-31 DIAGNOSIS — Z1231 Encounter for screening mammogram for malignant neoplasm of breast: Secondary | ICD-10-CM | POA: Diagnosis not present

## 2022-11-06 DIAGNOSIS — R92322 Mammographic fibroglandular density, left breast: Secondary | ICD-10-CM | POA: Diagnosis not present

## 2022-11-13 ENCOUNTER — Telehealth: Payer: Self-pay | Admitting: Pharmacist

## 2022-11-13 NOTE — Telephone Encounter (Signed)
Received new start paperwork for Tezspire. Pending OV note to be signed, please start Tezspire SQ BIV for self-admin  Dose: 210mg  every 4 weeks Dx: J45.50  Tezspire forms placed in PAP pending info folder in pharmacy office  Chesley Mires, PharmD, MPH, BCPS, CPP Clinical Pharmacist (Rheumatology and Pulmonology)

## 2022-11-20 NOTE — Telephone Encounter (Signed)
Submitted a Prior Authorization request to [facility] for TEZSPIRE via CoverMyMeds. Will update once we receive a response.  Key: [id number]  Chesley Mires, PharmD, MPH, BCPS, CPP Clinical Pharmacist (Rheumatology and Pulmonology)

## 2022-11-21 NOTE — Telephone Encounter (Signed)
Received fax from [employer] stating [employer] does not handle this review. Must cntact [employer] at [phone]. PA form completed for [employer] and faxed with clinicals  Phone: [phone] Fax: [phone]  Chesley Mires, PharmD, MPH, BCPS, CPP Clinical Pharmacist (Rheumatology and Pulmonology)

## 2022-11-23 NOTE — Telephone Encounter (Signed)
Received a fax regarding Prior Authorization from  [employer]  for TEZSPIRE. Authorization has been DENIED because Dorothea Ogle is excluded from pharmacy benefits plan. Consider Xolair, Virginia Crews, Dupixent or Fasenra through pharmacy benefit. Patient likely has to receive at infusion center through infusion center and patient lives in [place]  Patient had normal IgE (not a candidate for Xolair) Does have history of baseline elevated abs eos of 200 back in September 2023  Routing to Dr. Judeth Horn for advisement.  Chesley Mires, PharmD, MPH, BCPS, CPP Clinical Pharmacist (Rheumatology and Pulmonology)

## 2022-11-26 NOTE — Telephone Encounter (Signed)
Katie Knight forms did not include provider signature. Placed wit Dr. Judeth Horn today  Katie Knight, PharmD, MPH, BCPS, CPP Clinical Pharmacist (Rheumatology and Pulmonology)

## 2022-11-26 NOTE — Telephone Encounter (Signed)
Hmm - medically would prefer Tezspire - if excluded from bridge program can pursue Nucala for mild eos 200.

## 2022-11-27 NOTE — Telephone Encounter (Signed)
Received signed provider form. Submitted forms to [employer] with PA denial letter and insurance card copy for consideration of [employer] enrollment  Fax: [phone] Phone: [phone]  Chesley Mires, PharmD, MPH, BCPS, CPP Clinical Pharmacist (Rheumatology and Pulmonology)

## 2022-11-28 DIAGNOSIS — R5383 Other fatigue: Secondary | ICD-10-CM | POA: Diagnosis not present

## 2022-12-03 NOTE — Telephone Encounter (Signed)
Received return call from [employer]. She reviewed case and is moving case to bridge program team for next steps. 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 Rep put in urgent IT request to fix this.  Chesley Mires, PharmD, MPH, BCPS, CPP Clinical Pharmacist (Rheumatology and Pulmonology)

## 2022-12-03 NOTE — Telephone Encounter (Signed)
Received fax from [employer] stating patient is ineligible for [employer] program. Per rep, patient actually is eligible for Fast Start program. Program will continue to working on this case and we will get communication regaridng updates  Tezspire Case # [id number]  Chesley Mires, PharmD, MPH, BCPS, CPP Clinical Pharmacist (Rheumatology and Pulmonology)

## 2022-12-04 DIAGNOSIS — Z0000 Encounter for general adult medical examination without abnormal findings: Secondary | ICD-10-CM | POA: Diagnosis not present

## 2022-12-04 DIAGNOSIS — E663 Overweight: Secondary | ICD-10-CM | POA: Diagnosis not present

## 2022-12-04 DIAGNOSIS — D649 Anemia, unspecified: Secondary | ICD-10-CM | POA: Diagnosis not present

## 2022-12-12 DIAGNOSIS — Z1339 Encounter for screening examination for other mental health and behavioral disorders: Secondary | ICD-10-CM | POA: Diagnosis not present

## 2022-12-12 DIAGNOSIS — Z1331 Encounter for screening for depression: Secondary | ICD-10-CM | POA: Diagnosis not present

## 2022-12-12 DIAGNOSIS — Z0000 Encounter for general adult medical examination without abnormal findings: Secondary | ICD-10-CM | POA: Diagnosis not present

## 2022-12-13 ENCOUNTER — Encounter: Payer: Self-pay | Admitting: Pulmonary Disease

## 2022-12-18 ENCOUNTER — Encounter: Payer: Self-pay | Admitting: Internal Medicine

## 2022-12-31 NOTE — Telephone Encounter (Signed)
Left VM for patient for update on Tezspire as she did not respond to [employer]

## 2023-01-01 ENCOUNTER — Ambulatory Visit: Payer: BC Managed Care – PPO | Admitting: Pharmacist

## 2023-01-01 DIAGNOSIS — Z7189 Other specified counseling: Secondary | ICD-10-CM

## 2023-01-01 DIAGNOSIS — J4540 Moderate persistent asthma, uncomplicated: Secondary | ICD-10-CM

## 2023-01-01 MED ORDER — BUDESONIDE-FORMOTEROL FUMARATE 160-4.5 MCG/ACT IN AERO: 2.0000 | INHALATION_SPRAY | Freq: Two times a day (BID) | RESPIRATORY_TRACT | 5 refills | Status: DC

## 2023-01-01 MED ORDER — TEZSPIRE 210 MG/1.91ML ~~LOC~~ SOAJ: 210.0000 mg | SUBCUTANEOUS | 5 refills | Status: DC

## 2023-01-01 NOTE — Patient Instructions (Signed)
Your next TEZSPIRE dose is due on 01/29/23, 02/26/23, and every 4 weeks thereafter  CONTINUE Symbicort (2 puffs twice daily)  Your prescription will be shipped from [facility]. Their phone number is [phone]. They will mail your medication to your home.  You will need to be seen by your provider in 3 to 4 months to assess how TEZSPIRE is working for you. Please ensure you have a follow-up appointment scheduled in September or October 2024. Call our clinic if you need to make this appointment.  How to manage an injection site reaction: Remember the 5 C's: COUNTER - leave on the counter at least 30 minutes but up to overnight to bring medication to room temperature. This may help prevent stinging COLD - place something cold (like an ice gel pack or cold water bottle) on the injection site just before cleansing with alcohol. This may help reduce pain CLARITIN - use Claritin (generic name is loratadine) for the first two weeks of treatment or the day of, the day before, and the day after injecting. This will help to minimize injection site reactions CORTISONE CREAM - apply if injection site is irritated and itching CALL ME - if injection site reaction is bigger than the size of your fist, looks infected, blisters, or if you develop hives

## 2023-01-01 NOTE — Progress Notes (Signed)
HPI Patient presents today to [facility] to see pharmacy team for [facility] new start.  Past medical history includes severe persistent asthma. She reports frequent albuterol use - usually every morning and sometimes throughout the day. Denies frequent nighttime awakenings. Reports chronic cough that has interfered with her ability to be active and has led to weight gain  Last seen by Dr. Judeth Horn on 10/22/22. She is naive to biologics for asthma. She has self-administered weight loss med just once in office but was unable to start this due to back-order of medication supply.  She has f/u with Dr. Corliss Skains (rheum) scheduled in early August 2024 due to positive ANA titer.  Respiratory Medications Current regimen: Symbicort 160-4.5 mcg (2 puffs twice daily) Patient reports no known adherence challenges  OBJECTIVE No Known Allergies  Outpatient Encounter Medications as of 01/01/2023  Medication Sig   budesonide-formoterol (SYMBICORT) 160-4.5 MCG/ACT inhaler Inhale 2 puffs into the lungs daily as needed (if don't  have other inhaler).   fluticasone (FLONASE) 50 MCG/ACT nasal spray Place 1 spray into both nostrils 2 (two) times daily.   LINZESS 145 MCG CAPS capsule Take 145 mcg by mouth daily.   Multiple Vitamins-Minerals (MULTIVITAMIN WITH MINERALS) tablet Take 1 tablet by mouth daily. gummy   oxymetazoline (AFRIN) 0.05 % nasal spray Place 1 spray into both nostrils daily as needed for congestion.   pantoprazole (PROTONIX) 20 MG tablet Take 1 tablet (20 mg total) by mouth daily.   PROAIR HFA 108 (90 Base) MCG/ACT inhaler Inhale 2 puffs into the lungs every 4 (four) hours as needed for wheezing or shortness of breath.   promethazine-dextromethorphan (PROMETHAZINE-DM) 6.25-15 MG/5ML syrup Take 2.5 mLs by mouth 4 (four) times daily as needed for cough.   No facility-administered encounter medications on file as of 01/01/2023.      There is no immunization history on file for this  patient.   PFTs    Latest Ref Rng & Units 10/19/2022   11:38 AM  PFT Results  FVC-Pre L 2.72   FVC-Predicted Pre % 69   FVC-Post L 2.55   FVC-Predicted Post % 65   Pre FEV1/FVC % % 80   Post FEV1/FCV % % 86   FEV1-Pre L 2.17   FEV1-Predicted Pre % 69   FEV1-Post L 2.19   DLCO uncorrected ml/min/mmHg 17.48   DLCO UNC% % 75   DLCO corrected ml/min/mmHg 17.48   DLCO COR %Predicted % 75   DLVA Predicted % 103   TLC L 4.01   TLC % Predicted % 74   RV % Predicted % 74     Eosinophils Most recent blood eosinophil count was 200 cells/microL taken on 04/14/2022.   IgE: 51 on 08/27/22  Assessment   Biologics training for tezepulumab Dorothea Ogle)  Goals of therapy: Mechanism: human monoclonal IgG2? antibody that binds to TSLP. This blocks TSLP from its effect on inflammation including reduce eosinophils, IgE, FeNO, IL-5, and IL-13. Mechanism is not definitively established. Reviewed that Dorothea Ogle is add-on medication and patient must continue maintenance inhaler regimen. Response to therapy: may take 3-4 months to determine efficacy.  Side effects: injection site reaction (6-18%), antibody development (2%), arthralgia (4%), back pain (4%), pharyngitis (4%)  Dose: Tezspire 210 mg once every 4 weeks  Administration/Storage:  Reviewed administration sites of thigh or abdomen (at least 2-3 inches away from abdomen). Reviewed the upper arm is only appropriate if caregiver is administering injection  Do not shake pen/syringe as this could lead to product foaming  or precipitation. Do not shake syringe as this could lead to product foaming or precipitation.  Access: Approval of Tezspire through: bridge program Patient enrolled into copay card program to help with copay assistance.  Patient self-administered Tezspire 210mg /1.91 ml in left lower abdomen using sample Tezspire 210mg /1.91 ml Autoinjector pen NDC: [id number] Lot: [id number] Expiration: 06/14/2024  Patient monitored for 30  minutes for adverse reaction.  Patient tolerated without issue. Injection site checked and no redness or swelling noted. Patient denies any itchiness or irritation at injection site  Medication Reconciliation  A drug regimen assessment was performed, including review of allergies, interactions, disease-state management, dosing and immunization history. Medications were reviewed with the patient, including name, instructions, indication, goals of therapy, potential side effects, importance of adherence, and safe use.  Drug interaction(s): none noted  PLAN Continue Tezspire 210mg  SQ every 28 days.  Rx sent to:  [facility] (pharmacy for [facility]) . Patient Continue maintenance asthma regimen of: Symbicort 160-4.66mcg (2 puffs twice daily) Keep f/u with rheumatology in August 2024 F/u with Dr. Judeth Horn in 3-4 months to assess response to [facility]  All questions encouraged and answered.  Instructed patient to reach out with any further questions or concerns.  Thank you for allowing pharmacy to participate in this patient's care.  This appointment required 45 minutes of patient care (this includes precharting, chart review, review of results, face-to-face care, etc.).  Chesley Mires, PharmD, MPH, BCPS, CPP Clinical Pharmacist (Rheumatology and Pulmonology)

## 2023-01-03 ENCOUNTER — Ambulatory Visit (AMBULATORY_SURGERY_CENTER): Payer: BC Managed Care – PPO | Admitting: *Deleted

## 2023-01-03 ENCOUNTER — Encounter: Payer: Self-pay | Admitting: Internal Medicine

## 2023-01-03 VITALS — Ht 66.0 in | Wt 238.0 lb

## 2023-01-03 DIAGNOSIS — Z1211 Encounter for screening for malignant neoplasm of colon: Secondary | ICD-10-CM

## 2023-01-03 MED ORDER — NA SULFATE-K SULFATE-MG SULF 17.5-3.13-1.6 GM/177ML PO SOLN: 1.0000 | Freq: Once | ORAL | 0 refills | Status: AC

## 2023-01-03 NOTE — Progress Notes (Signed)
Pt's previsit is done over the phone and all paperwork (prep instructions) sent to patient.  Pt's name and DOB verified at the beginning of the previsit.  Pt denies any difficulty with ambulating.   No egg or soy allergy known to patient  No issues known to pt with past sedation with any surgeries or procedures Patient denies ever being told they had issues or difficulty with intubation  No FH of Malignant Hyperthermia Pt is not on diet pills Pt is not on  home 02  Pt is not on blood thinners  Pt denies issues with constipation  Pt is not on dialysis Pt denies any upcoming cardiac testing Pt encouraged to use to use Singlecare or Goodrx to reduce cost  Pt told to bring inhalers to procedure  Patient's chart reviewed by Cathlyn Parsons CNRA prior to previsit and patient appropriate for the [facility].  Previsit completed and red dot placed by patient's name on their procedure day (on provider's schedule).

## 2023-01-29 ENCOUNTER — Encounter: Payer: Self-pay | Admitting: Pulmonary Disease

## 2023-01-29 ENCOUNTER — Ambulatory Visit: Payer: BC Managed Care – PPO | Admitting: Pulmonary Disease

## 2023-01-29 VITALS — BP 124/70 | HR 81 | Ht 66.0 in | Wt 238.0 lb

## 2023-01-29 DIAGNOSIS — J4551 Severe persistent asthma with (acute) exacerbation: Secondary | ICD-10-CM

## 2023-01-29 DIAGNOSIS — R053 Chronic cough: Secondary | ICD-10-CM | POA: Diagnosis not present

## 2023-01-29 MED ORDER — PREDNISONE 20 MG PO TABS: ORAL_TABLET | ORAL | 0 refills | Status: AC

## 2023-01-29 NOTE — Patient Instructions (Addendum)
Nice to see you again  I sent in some prednisone with wheezing and worsening symptoms to improve worse.  I trust your discretion on when to start this.  I hope the Katie Knight will yield some improvement in the coming weeks.  No other changes to medication  Return to clinic in 3 months or sooner if needed with Dr. Judeth Horn

## 2023-01-29 NOTE — Progress Notes (Signed)
@Patient  ID: Katie Knight, female    DOB: [birth year], 47 y.o.   MRN: [id number]  Chief Complaint  Patient presents with   Follow-up    3 mo f/u for asthma. States she is still using her Tezspire, Symbicort and albuterol.     Referring provider: [name], MD  HPI:   47 y.o. woman whom we are seeing in follow up for evaluation of cough, dyspnea on exertion, asthma. Multiple notes from our clinic regarding obtaining biologic medications reviewed.  At last visit, overall cough had improved with time and Symbicort.  Reported rare albuterol use..  Still with some residual cough.  Given chronicity of cough and still desired to see improved symptoms, discussed starting biologic therapy for cough variant asthma.  It took some time but eventually tested by her was approved.  help via manufacturing assistance.  First dose 01/01/2023.  Due for second dose today.  Unfortunately symptoms worsen over time.  Seems worse with work exposure a lot of heat dust etc.  Spouse present at visit today indicates that when she is been out of work for period time her cough seems significantly improved.  Still awaiting rheumatology appointment for positive ANA.  Looks like she has appointment for endoscopy 02/01/2023 with [facility] gastroenterology.  HPI at initial visit: Noted shortness of breath for the last several months.  With exertion.  Worse on inclines or stairs.  Not much history at rest.  No time of day when things are better or worse.  No position make things better or worse.  Recent prescribed albuterol with mild improvement in symptoms.  No other alleviating or exacerbating factors.  Associated symptoms of cough.  Seems worse in the morning.  Mildly productive.  Not much of an issue today.  Endorses nasal congestion, postnasal drip.  Denies reflux.  Work-up today includes EKG treadmill stress test that was normal 12/2021.  Most recent chest image seen/x-ray 12/27/2021 personally reviewed and interpreted  as tenting of the right diaphragm with linear infiltrates most consistent with atelectasis given evidence of volume loss, reviewed reported concern for pneumonia.  Reviewed TTE 2019 that is largely normal.  Reviewed CT chest 02/2018 revealed scattered diffuse peribronchovascular groundglass nodules on my interpretation.  PMH: Asthma Surgical history: D&C Family history: Father with hypertension Social history: Never smoker, lives in [facility]  Questionaires / Pulmonary Flowsheets:   ACT:  Asthma Control Test ACT Total Score  03/26/2022  9:55 AM 15  01/19/2022  3:41 PM 18    MMRC:     No data to display          Epworth:      No data to display          Tests:   FENO:  No results found for: "NITRICOXIDE"  PFT:    Latest Ref Rng & Units 10/19/2022   11:38 AM  PFT Results  FVC-Pre L 2.72   FVC-Predicted Pre % 69   FVC-Post L 2.55   FVC-Predicted Post % 65   Pre FEV1/FVC % % 80   Post FEV1/FCV % % 86   FEV1-Pre L 2.17   FEV1-Predicted Pre % 69   FEV1-Post L 2.19   DLCO uncorrected ml/min/mmHg 17.48   DLCO UNC% % 75   DLCO corrected ml/min/mmHg 17.48   DLCO COR %Predicted % 75   DLVA Predicted % 103   TLC L 4.01   TLC % Predicted % 74   RV % Predicted % 74  WALK:      No data to display          Imaging: Personally reviewed and as per EMR discussion this note No results found.  Lab Results: Personally reviewed CBC    Component Value Date/Time   WBC 4.4 04/14/2022 1607   RBC 3.68 (L) 04/14/2022 1607   HGB 11.0 (L) 04/14/2022 1607   HCT 34.0 (L) 04/14/2022 1607   PLT 375 04/14/2022 1607   MCV 92.4 04/14/2022 1607   MCH 29.9 04/14/2022 1607   MCHC 32.4 04/14/2022 1607   RDW 12.5 04/14/2022 1607   LYMPHSABS 1.2 04/14/2022 1607   MONOABS 0.5 04/14/2022 1607   EOSABS 0.2 04/14/2022 1607   BASOSABS 0.0 04/14/2022 1607    BMET    Component Value Date/Time   NA 138 04/14/2022 1607   K 3.8 04/14/2022 1607   CL 104  04/14/2022 1607   CO2 25 04/14/2022 1607   GLUCOSE 86 04/14/2022 1607   BUN 13 04/14/2022 1607   CREATININE 0.89 04/14/2022 1607   CALCIUM 8.8 (L) 04/14/2022 1607   GFRNONAA >60 04/14/2022 1607   GFRAA >60 02/06/2018 2019    BNP No results found for: "BNP"  ProBNP No results found for: "PROBNP"  Specialty Problems       Pulmonary Problems   Asthma   Chronic cough     No Known Allergies   There is no immunization history on file for this patient.  Past Medical History:  Diagnosis Date   Asthma    Hyperlipidemia     Tobacco History: Social History   Tobacco Use  Smoking Status Never   Passive exposure: Past  Smokeless Tobacco Never   Counseling given: Not Answered   Continue to not smoke  Outpatient Encounter Medications as of 01/29/2023  Medication Sig   budesonide-formoterol (SYMBICORT) 160-4.5 MCG/ACT inhaler Inhale 2 puffs into the lungs in the morning and at bedtime.   fluticasone (FLONASE) 50 MCG/ACT nasal spray Place 1 spray into both nostrils 2 (two) times daily.   LINZESS 145 MCG CAPS capsule Take 145 mcg by mouth daily.   Multiple Vitamins-Minerals (MULTIVITAMIN WITH MINERALS) tablet Take 1 tablet by mouth daily. gummy   oxymetazoline (AFRIN) 0.05 % nasal spray Place 1 spray into both nostrils daily as needed for congestion.   pantoprazole (PROTONIX) 20 MG tablet Take 1 tablet (20 mg total) by mouth daily. (Patient taking differently: Take 20 mg by mouth daily. Takes PRN)   phentermine 30 MG capsule Take 30 mg by mouth daily.   predniSONE (DELTASONE) 20 MG tablet Take 2 tablets (40 mg total) by mouth daily with breakfast for 5 days, THEN 1 tablet (20 mg total) daily with breakfast for 5 days.   PROAIR HFA 108 (90 Base) MCG/ACT inhaler Inhale 2 puffs into the lungs every 4 (four) hours as needed for wheezing or shortness of breath.   Tezepelumab-ekko (TEZSPIRE) 210 MG/1. SOAJ Inject 210 mg into the skin every 28 (twenty-eight) days.   No  facility-administered encounter medications on file as of 01/29/2023.     Review of Systems  Review of Systems  N/a Physical Exam  BP 124/70   Pulse 81   Ht 5\' 6"  (1.676 m)   Wt 238 lb (108 kg)   SpO2 98% Comment: on RA  BMI 38.41 kg/m   Wt Readings from Last 5 Encounters:  01/29/23 238 lb (108 kg)  01/03/23 238 lb (108 kg)  10/22/22 264 lb (119.7 kg)  08/27/22 265 lb  9.6 oz (120.5 kg)  05/15/22 257 lb (116.6 kg)    BMI Readings from Last 5 Encounters:  01/29/23 38.41 kg/m  01/03/23 38.41 kg/m  10/22/22 42.61 kg/m  08/27/22 44.20 kg/m  05/15/22 42.77 kg/m     Physical Exam General: Well-appearing, no acute distress Eyes: EOMI, no icterus Neck: Supple, no JVP Pulmonary: Bilateral mild end expiratory wheeze, normal work of breathing Cardiovascular: Warm, no edema Abdomen: Nondistended, bowel sounds present MSK: Normal joints, no effusion, no synovitis  Neuro: Normal gait, no weakness Psych: Normal mood, full affect   Assessment & Plan:   Dyspnea on exertion: Fear related to poorly controlled asthma.  Initially improved with ICS/LABA therapy.  Other considerations include deconditioning.  Now with persistent endobronchial nodules and mild lymphadenopathy possible sarcoidosis as cause.  Bronchoscopy with EBUS 04/2022 without any evidence of sarcoidosis on biopsy nor suggestive on cell count.  PFT 10/2022 with mild restriction and mild reduced DLCO.  Cough suspect related to poorly controlled asthma due to occupational exposure with exacerbation: Historically improved with time off from work and seems worse at work with heat humidity and dust exposure.  With some interval improvement with ICS/LABA.Marland Kitchen  Also with postnasal drip likely contributing.  Unfortunate despite high-dose ICS/LABA therapy with mild improvement initially symptoms have worsened.  Did not respond well to aggressive intranasal regimen.   Some improvement with steroids in past. Started Tezspire 12/2022  for uncontrolled cough variant asthma. Continue ICS/LABA via.  Recent worsening symptoms with associated with work environment.  Wheezing on exam.  Prednisone 40 mg daily for 5 days then 20 mg daily for 5 days then stop.  Scattered endobronchial nodules, groundglass: Possible sarcoidosis with some lymphadenopathy.  Could be contribute to her symptoms and cough has not responded well to inhaled therapies and intranasal therapies.  Size of lymph nodes had shrunk at time of EBUS favoring more reactive lymphadenopathy.  No evidence of sarcoidosis on bronchoscopy studies.     Return in about 3 months (around 05/01/2023).   Karren Burly, MD 01/29/2023

## 2023-01-31 ENCOUNTER — Encounter: Payer: Self-pay | Admitting: Certified Registered Nurse Anesthetist

## 2023-02-01 ENCOUNTER — Ambulatory Visit (AMBULATORY_SURGERY_CENTER): Payer: BC Managed Care – PPO | Admitting: Internal Medicine

## 2023-02-01 ENCOUNTER — Encounter: Payer: Self-pay | Admitting: Internal Medicine

## 2023-02-01 VITALS — BP 143/94 | HR 92 | Temp 98.6°F | Resp 20 | Ht 66.0 in | Wt 238.0 lb

## 2023-02-01 DIAGNOSIS — Z1211 Encounter for screening for malignant neoplasm of colon: Secondary | ICD-10-CM

## 2023-02-01 DIAGNOSIS — D123 Benign neoplasm of transverse colon: Secondary | ICD-10-CM

## 2023-02-01 MED ORDER — SODIUM CHLORIDE 0.9 % IV SOLN: 500.0000 mL | Freq: Once | INTRAVENOUS | Status: DC

## 2023-02-01 NOTE — Progress Notes (Signed)
Brief Colonoscopy Report (Provation app was down): Excellent prep. Torturous colon. Normal terminal ileum. Normal appendiceal orifice and ileocecal valve. One 3 mm sessile polyp in the transverse colon was fully removed with cold snare. Internal hemorrhoids on rectal retroflexion.

## 2023-02-01 NOTE — Progress Notes (Signed)
GASTROENTEROLOGY PROCEDURE H&P NOTE   Primary Care Physician: [name], MD    Reason for Procedure:   Colon cancer screening  Plan:    Colonoscopy  Patient is appropriate for endoscopic procedure(s) in the ambulatory ([facility]) setting.  The nature of the procedure, as well as the risks, benefits, and alternatives were carefully and thoroughly reviewed with the patient. Ample time for discussion and questions allowed. The patient understood, was satisfied, and agreed to proceed.     HPI: Katie Knight is a 47 y.o. female who presents for colonoscopy for colon cancer screening. Denies blood in stools, changes in bowel habits, or unintentional weight loss. Denies family history of colon cancer. This is the patient's first colonoscopy.  Past Medical History:  Diagnosis Date   Asthma    Hyperlipidemia     Past Surgical History:  Procedure Laterality Date   BRONCHIAL BIOPSY  04/30/2022   Procedure: BRONCHIAL BIOPSIES;  Surgeon: Karren Burly, MD;  Location: [facility];  Service: Endoscopy;;   BRONCHIAL NEEDLE ASPIRATION BIOPSY  04/30/2022   Procedure: BRONCHIAL NEEDLE ASPIRATION BIOPSIES;  Surgeon: Karren Burly, MD;  Location: [facility];  Service: Endoscopy;;   BRONCHIAL WASHINGS  04/30/2022   Procedure: BRONCHIAL WASHINGS;  Surgeon: Karren Burly, MD;  Location: [facility];  Service: Endoscopy;;   DILATION AND CURETTAGE OF UTERUS     ENDOBRONCHIAL ULTRASOUND N/A 04/30/2022   Procedure: ENDOBRONCHIAL ULTRASOUND;  Surgeon: Karren Burly, MD;  Location: [facility];  Service: Endoscopy;  Laterality: N/A;   VIDEO BRONCHOSCOPY  04/30/2022   Procedure: VIDEO BRONCHOSCOPY WITHOUT FLUORO;  Surgeon: Karren Burly, MD;  Location: [facility];  Service: Endoscopy;;    Prior to Admission medications   Medication Sig Start Date End Date Taking? Authorizing Provider  budesonide-formoterol (SYMBICORT) 160-4.5 MCG/ACT inhaler Inhale 2  puffs into the lungs in the morning and at bedtime. 01/01/23   Hunsucker, Lesia Sago, MD  fluticasone (FLONASE) 50 MCG/ACT nasal spray Place 1 spray into both nostrils 2 (two) times daily. 01/19/22   Hunsucker, Lesia Sago, MD  LINZESS 145 MCG CAPS capsule Take 145 mcg by mouth daily. 12/07/21   [provider]  Multiple Vitamins-Minerals (MULTIVITAMIN WITH MINERALS) tablet Take 1 tablet by mouth daily. gummy    [provider]  oxymetazoline (AFRIN) 0.05 % nasal spray Place 1 spray into both nostrils daily as needed for congestion.    [provider]  pantoprazole (PROTONIX) 20 MG tablet Take 1 tablet (20 mg total) by mouth daily. Patient taking differently: Take 20 mg by mouth daily. Takes PRN 04/14/22   Arthor Captain, PA-C  phentermine 30 MG capsule Take 30 mg by mouth daily. 11/28/22   [provider]  predniSONE (DELTASONE) 20 MG tablet Take 2 tablets (40 mg total) by mouth daily with breakfast for 5 days, THEN 1 tablet (20 mg total) daily with breakfast for 5 days. 01/29/23 02/08/23  Hunsucker, Lesia Sago, MD  PROAIR HFA 108 [phone] Base) MCG/ACT inhaler Inhale 2 puffs into the lungs every 4 (four) hours as needed for wheezing or shortness of breath. 12/22/21   O'Neal, Ronnald Ramp, MD  Tezepelumab-ekko (TEZSPIRE) 210 MG/1. SOAJ Inject 210 mg into the skin every 28 (twenty-eight) days. 01/01/23   Hunsucker, Lesia Sago, MD    Current Outpatient Medications  Medication Sig Dispense Refill   budesonide-formoterol (SYMBICORT) 160-4.5 MCG/ACT inhaler Inhale 2 puffs into the lungs in the morning and at bedtime. 1 each 5   LINZESS 145 MCG CAPS  capsule Take 145 mcg by mouth daily.     Tezepelumab-ekko (TEZSPIRE) 210 MG/1. SOAJ Inject 210 mg into the skin every 28 (twenty-eight) days. 1.91 mL 5   fluticasone (FLONASE) 50 MCG/ACT nasal spray Place 1 spray into both nostrils 2 (two) times daily. 16 g 2   Multiple Vitamins-Minerals (MULTIVITAMIN WITH MINERALS) tablet Take 1  tablet by mouth daily. gummy     oxymetazoline (AFRIN) 0.05 % nasal spray Place 1 spray into both nostrils daily as needed for congestion.     phentermine 30 MG capsule Take 30 mg by mouth daily.     predniSONE (DELTASONE) 20 MG tablet Take 2 tablets (40 mg total) by mouth daily with breakfast for 5 days, THEN 1 tablet (20 mg total) daily with breakfast for 5 days. (Patient not taking: Reported on 02/01/2023) 15 tablet 0   PROAIR HFA 108 (90 Base) MCG/ACT inhaler Inhale 2 puffs into the lungs every 4 (four) hours as needed for wheezing or shortness of breath. 6.7 each 6   Current Facility-Administered Medications  Medication Dose Route Frequency Provider Last Rate Last Admin   0.9 %  sodium chloride infusion  500 mL Intravenous Once Imogene Burn, MD        Allergies as of 02/01/2023   (No Known Allergies)    Family History  Problem Relation Age of Onset   Hypertension Father    Colon cancer Neg Hx    Esophageal cancer Neg Hx    Rectal cancer Neg Hx    Stomach cancer Neg Hx     Social History   Socioeconomic History   Marital status: Married    Spouse name: Not on file   Number of children: 3   Years of education: Not on file   Highest education level: Not on file  Occupational History   Occupation: [employer]  Tobacco Use   Smoking status: Never    Passive exposure: Past   Smokeless tobacco: Never  Vaping Use   Vaping status: Never Used  Substance and Sexual Activity   Alcohol use: Yes    Comment: occ   Drug use: Never   Sexual activity: Not on file  Other Topics Concern   Not on file  Social History Narrative   Not on file   Social Determinants of Health   Financial Resource Strain: Not on file  Food Insecurity: Not on file  Transportation Needs: Not on file  Physical Activity: Not on file  Stress: Not on file  Social Connections: Not on file  Intimate Partner Violence: Not on file    Physical Exam: Vital signs in last 24 hours: BP 134/74    Pulse 82   Temp 98.6 F (37 C) (Temporal)   Ht 5\' 6"  (1.676 m)   Wt 238 lb (108 kg)   SpO2 97%   BMI 38.41 kg/m  GEN: NAD EYE: Sclerae anicteric ENT: MMM CV: Non-tachycardic Pulm: No increased work of breathing GI: Soft, NT/ND NEURO:  Alert & Oriented   Eulah Pont, MD [facility]  02/01/2023 10:07 AM

## 2023-02-01 NOTE — Progress Notes (Signed)
Called to room to assist during endoscopic procedure.  Patient ID and intended procedure confirmed with present staff. Received instructions for my participation in the procedure from the performing physician.  

## 2023-02-01 NOTE — Progress Notes (Signed)
Pt's states no medical or surgical changes since previsit or office visit. 

## 2023-02-04 ENCOUNTER — Telehealth: Payer: Self-pay

## 2023-02-04 NOTE — Telephone Encounter (Signed)
No answer, left message to call if having any issues or concerns, B.Schwartz RN 

## 2023-02-05 NOTE — Op Note (Signed)
[facility] Patient Name: Katie Knight Procedure Date: 02/05/2023 11:24 AM MRN: [id number] Endoscopist: Madelyn Brunner Clark , , [id number] Age: 47 Referring MD:  Date of Birth: [birth year] Gender: Female Account #: [id number] Procedure:                Colonoscopy Indications:              Screening for colorectal malignant neoplasm, This                            is the patient's first colonoscopy Medicines:                Monitored Anesthesia Care Procedure:                Pre-Anesthesia Assessment:                           - Prior to the procedure, a History and Physical                            was performed, and patient medications and                            allergies were reviewed. The patient's tolerance of                            previous anesthesia was also reviewed. The risks                            and benefits of the procedure and the sedation                            options and risks were discussed with the patient.                            All questions were answered, and informed consent                            was obtained. Prior Anticoagulants: The patient has                            taken no anticoagulant or antiplatelet agents. ASA                            Grade Assessment: II - A patient with mild systemic                            disease. After reviewing the risks and benefits,                            the patient was deemed in satisfactory condition to                            undergo the procedure.  After obtaining informed consent, the colonoscope                            was passed under direct vision. Throughout the                            procedure, the patient's blood pressure, pulse, and                            oxygen saturations were monitored continuously. The                            CF [id number] #[id number] was introduced through the anus                            and advanced  to the the terminal ileum. The                            colonoscopy was somewhat difficult due to a                            tortuous colon. The patient tolerated the procedure                            well. The quality of the bowel preparation was                            excellent. The terminal ileum, ileocecal valve,                            appendiceal orifice, and rectum were photographed. Findings:                 The terminal ileum appeared normal.                           A 3 mm polyp was found in the transverse colon. The                            polyp was sessile. The polyp was removed with a                            cold snare. Resection and retrieval were complete.                           Non-bleeding internal hemorrhoids were found during                            retroflexion. Complications:            No immediate complications. Estimated Blood Loss:     Estimated blood loss was minimal. Impression:               - The examined portion of the ileum was normal.                           -  One 3 mm polyp in the transverse colon, removed                            with a cold snare. Resected and retrieved.                           - Non-bleeding internal hemorrhoids.                           - Scope in at 10:13 AM. Cecum time at 10:20 AM.                            Scope out at 10:31 AM.                           - No pictures were taken because Provation                            application was down during the procedure. Recommendation:           - Discharge patient to home (with escort).                           - Await pathology results.                           - The findings and recommendations were discussed                            with the patient. Dr Particia Lather "Lowell" Mikes,  02/05/2023 11:27:59 AM

## 2023-02-06 ENCOUNTER — Encounter: Payer: Self-pay | Admitting: Internal Medicine

## 2023-02-06 ENCOUNTER — Telehealth: Payer: Self-pay | Admitting: Pharmacist

## 2023-02-06 NOTE — Progress Notes (Signed)
Office Visit Note  Patient: Katie Knight             Date of Birth: 22-Aug-1975           MRN: [id number]             PCP: [name], MD Referring: Karren Burly, MD Visit Date: 02/20/2023 Occupation: @GUAROCC @  Subjective:  Cough and +ANA  History of Present Illness: Katie Knight is a 47 y.o. female seen in consultation per request of Dr. Judeth Horn.  Patient states that she has had asthma since she was 47 years old.  In June 2024 she started experiencing persistent cough despite taking over-the-counter medications.  She states she was coughing so much to the point she was having nausea and vomiting.  She was seen by her PCP and was given a prednisone taper which helped about a week and then the symptoms recurred.  After that she was referred to Dr. Judeth Horn.  She had a previous CT chest in August 2019 which showed a scattered diffuse Perry bronchovascular groundglass nodules per Dr. Laurena Spies note from July 2023.  He thought her symptoms were consistent with uncontrolled asthma.  He started her on Symbicort.  She had EKG treadmill stress test in June 2023 which was normal.  She had a bronchoscopy with EB [place] to evaluate for sarcoidosis.  The pathology report showed no lymphocytes.  Which indicated acute on chronic inflammation per Dr. Laurena Spies note.  No granulomatous inflammation was noted per pathology report.  Her BAL findings were acellular with normal or expected cell differentiation.  Medications were continued to treat asthma.  Sarcoidosis was in the differential although bronchoscopy was negative for sarcoidosis.  Patient has been experiencing shortness of breath on exertion.  Patient has not noticed much improvement in her shortness of breath and cough despite being on asthma medications.  She states she was coughing last night and also vomiting due to cough.  There is no history of ulcers, nasal ulcers, malar rash, photosensitivity, Raynaud's, inflammatory arthritis  and lymphadenopathy.  She experiences discomfort in her knee joints in her shins.  She states she played basketball in high school.  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  She believes that she is exposed to chemicals at her work.  She does treadmill and weights at the gym on a regular basis.  She is gravida 0.  She has a female spouse.  There is no family history of autoimmune disease.  No history of DVTs.    Activities of Daily Living:  Patient reports morning stiffness for 20 minutes.   Patient Reports nocturnal pain.  Difficulty dressing/grooming: Denies Difficulty climbing stairs: Reports Difficulty getting out of chair: Denies Difficulty using hands for taps, buttons, cutlery, and/or writing: Denies  Review of Systems  Constitutional:  Positive for fatigue.  HENT:  Positive for mouth dryness. Negative for mouth sores.   Eyes:  Negative for dryness.  Respiratory:  Positive for shortness of breath.   Cardiovascular:  Positive for chest pain. Negative for palpitations.  Gastrointestinal:  Negative for blood in stool, constipation and diarrhea.  Endocrine: Negative for increased urination.  Genitourinary:  Negative for involuntary urination.  Musculoskeletal:  Positive for joint pain, joint pain, myalgias, morning stiffness and myalgias. Negative for gait problem, joint swelling, muscle weakness and muscle tenderness.  Skin:  Negative for color change, rash, hair loss and sensitivity to sunlight.  Allergic/Immunologic: Negative for susceptible to infections.  Neurological:  Positive  for dizziness and headaches.  Hematological:  Negative for swollen glands.  Psychiatric/Behavioral:  Positive for sleep disturbance. Negative for depressed mood. The patient is not nervous/anxious.     PMFS History:  Patient Active Problem List   Diagnosis Date Noted   Asthma 08/27/2022   Chronic cough 08/27/2022   Abnormal CT of the chest 08/27/2022    Past Medical  History:  Diagnosis Date   Asthma    Hyperlipidemia     Family History  Problem Relation Age of Onset   Hypertension Mother    Hypertension Father    Colon cancer Neg Hx    Esophageal cancer Neg Hx    Rectal cancer Neg Hx    Stomach cancer Neg Hx    Past Surgical History:  Procedure Laterality Date   BRONCHIAL BIOPSY  04/30/2022   Procedure: BRONCHIAL BIOPSIES;  Surgeon: Karren Burly, MD;  Location: [facility];  Service: Endoscopy;;   BRONCHIAL NEEDLE ASPIRATION BIOPSY  04/30/2022   Procedure: BRONCHIAL NEEDLE ASPIRATION BIOPSIES;  Surgeon: Karren Burly, MD;  Location: [facility];  Service: Endoscopy;;   BRONCHIAL WASHINGS  04/30/2022   Procedure: BRONCHIAL WASHINGS;  Surgeon: Karren Burly, MD;  Location: [facility];  Service: Endoscopy;;   DILATION AND CURETTAGE OF UTERUS     ENDOBRONCHIAL ULTRASOUND N/A 04/30/2022   Procedure: ENDOBRONCHIAL ULTRASOUND;  Surgeon: Karren Burly, MD;  Location: [facility];  Service: Endoscopy;  Laterality: N/A;   VIDEO BRONCHOSCOPY  04/30/2022   Procedure: VIDEO BRONCHOSCOPY WITHOUT FLUORO;  Surgeon: Karren Burly, MD;  Location: [facility];  Service: Endoscopy;;   Social History   Social History Narrative   Not on file    There is no immunization history on file for this patient.   Objective: Vital Signs: BP (!) 143/82 (BP Location: Right Arm, Patient Position: Sitting, Cuff Size: Normal)   Pulse 88   Resp 15   Ht 5' 4.5" (1.638 m)   Wt 256 lb (116.1 kg)   BMI 43.26 kg/m    Physical Exam Vitals and nursing note reviewed.  Constitutional:      Appearance: She is well-developed.  HENT:     Head: Normocephalic and atraumatic.  Eyes:     Conjunctiva/sclera: Conjunctivae normal.  Cardiovascular:     Rate and Rhythm: Normal rate and regular rhythm.     Heart sounds: Normal heart sounds.  Pulmonary:     Effort: Pulmonary effort is normal.     Breath sounds: Normal breath sounds.   Abdominal:     General: Bowel sounds are normal.     Palpations: Abdomen is soft.  Musculoskeletal:     Cervical back: Normal range of motion.  Lymphadenopathy:     Cervical: No cervical adenopathy.  Skin:    General: Skin is warm and dry.     Capillary Refill: Capillary refill takes less than 2 seconds.  Neurological:     Mental Status: She is alert and oriented to person, place, and time.  Psychiatric:        Behavior: Behavior normal.      Musculoskeletal Exam: Cervical, thoracic and lumbar spine with good range of motion.  Shoulder joints, elbow joints, wrist joints, MCPs PIPs and DIPs with good range of motion with bilateral DIP thickening without discomfort.  Hip joints and knee joints with good range of motion without any warmth swelling or effusion.  There was no swelling or tenderness over ankles or MTPs.  CDAI Exam: CDAI Score: -- Patient Global: --;  Provider Global: -- Swollen: --; Tender: -- Joint Exam 02/20/2023   No joint exam has been documented for this visit   There is currently no information documented on the homunculus. Go to the Rheumatology activity and complete the homunculus joint exam.  Investigation: No additional findings.  Imaging: No results found.  Recent Labs: Lab Results  Component Value Date   WBC 4.4 04/14/2022   HGB 11.0 (L) 04/14/2022   PLT 375 04/14/2022   NA 138 04/14/2022   K 3.8 04/14/2022   CL 104 04/14/2022   CO2 25 04/14/2022   GLUCOSE 86 04/14/2022   BUN 13 04/14/2022   CREATININE 0.89 04/14/2022   BILITOT 0.4 04/14/2022   ALKPHOS 51 04/14/2022   AST 16 04/14/2022   ALT 10 04/14/2022   PROT 7.0 04/14/2022   ALBUMIN 4.0 04/14/2022   CALCIUM 8.8 (L) 04/14/2022   GFRAA >60 02/06/2018    Speciality Comments: No specialty comments available.  Procedures:  No procedures performed Allergies: Patient has no known allergies.   Assessment / Plan:     Visit Diagnoses: Positive ANA (antinuclear antibody) - 08/27/22: ANA  1:320NH, RF<14, anti-CCP<16, La ab-, Ro ab-, IgE WNL, hypersensitivity pneumonitis panel negative -patient had autoimmune workup for chronic cough and abnormal CT scan.  Her ANA was positive.  SSA and SSB antibodies were negative.  She denies any history of oral ulcers, nasal ulcers, malar rash, Ro sensitivity, Raynaud's, inflammatory arthritis or lymphadenopathy.  I will obtain additional labs today.  Plan: CBC with Differential/Platelet, COMPLETE METABOLIC PANEL WITH GFR, Sedimentation rate, ANA, RNP Antibody, Anti-Smith antibody, Anti-DNA antibody, double-stranded, C3 and C4, Anti-scleroderma antibody, Beta-2 glycoprotein antibodies, Cardiolipin antibodies, IgG, IgM, IgA  Chronic pain of both knees -she complains of off-and-on discomfort in her knee joints without any knee joint swelling.  No warmth swelling or effusion was noted.  Patient played basketball in high school.  She does treadmill on a regular basis and also lifts weights.  Plan: XR KNEE 3 VIEW RIGHT, XR KNEE 3 VIEW LEFT.  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  Myalgia -she gives history of some discomfort in her shins.  She denies any history of generalized muscle pain or weakness.  She has some difficulty getting up from the squatting position due to knee joint discomfort.  No muscular weakness was noted on the examination today.  Plan: CK  Other fatigue -she gives history of chronic fatigue.  Plan: TSH  Hilar lymphadenopathy -hilar lymphadenopathy was noted on the x-rays and the CT scan.  Unfortunately the biopsy was negative for sarcoidosis and the lymph nodes were difficult to assess due to small size.  I chatted with Dr. Judeth Horn through the text messaging today.  I will check ACE level today.  Plan: Angiotensin converting enzyme.  I would hold off any immunosuppressive therapy until the diagnosis of pulmonary sarcoidosis is  established.  Severe persistent asthma with acute exacerbation-she was diagnosed with asthma when she was 47 years old.  She appears to have aggressive asthma despite taking multiple medications.  She has been followed closely by Dr. Judeth Horn.  Chronic cough-she has been having chronic cough for the last 1 year.  She states that cough is severe enough that she vomits every night.  According to Dr. Judeth Horn her cough and asthma symptoms could be related to the chemicals at her work.  Patient is thinking of retiring in the future.  Abnormal CT of the chest -  04/11/22-predominantly peribronchovascular and lower lobe predominant ground-glass nodularity with interlobular septal thickening with some nodularity.  Per my messaging with Dr. Judeth Horn today, he plans to repeat imaging at the next visit.  Vitamin D deficiency -patient has been experiencing fatigue.  Will check vitamin D level today.  Plan: VITAMIN D 25 Hydroxy (Vit-D Deficiency, Fractures)  Irritable bowel syndrome with constipation - on Linzess  BMI 40.0-44.9, adult (HCC) - on Phenteramine  Orders: Orders Placed This Encounter  Procedures   XR KNEE 3 VIEW RIGHT   XR KNEE 3 VIEW LEFT   CBC with Differential/Platelet   COMPLETE METABOLIC PANEL WITH GFR   Sedimentation rate   CK   TSH   VITAMIN D 25 Hydroxy (Vit-D Deficiency, Fractures)   Angiotensin converting enzyme   ANA   RNP Antibody   Anti-Smith antibody   Anti-DNA antibody, double-stranded   C3 and C4   Anti-scleroderma antibody   Beta-2 glycoprotein antibodies   Cardiolipin antibodies, IgG, IgM, IgA   No orders of the defined types were placed in this encounter.    Follow-Up Instructions: Return for Positive ANA.   Pollyann Savoy, MD  Note - This record has been created using Animal nutritionist.  Chart creation errors have been sought, but may not always  have been located. Such creation errors do not reflect on  the standard of medical care.

## 2023-02-06 NOTE — Telephone Encounter (Signed)
Katie Knight from [employer] needs a PA denial number #

## 2023-02-20 ENCOUNTER — Encounter: Payer: Self-pay | Admitting: Rheumatology

## 2023-02-20 ENCOUNTER — Ambulatory Visit: Payer: BC Managed Care – PPO | Attending: Rheumatology | Admitting: Rheumatology

## 2023-02-20 ENCOUNTER — Ambulatory Visit (INDEPENDENT_AMBULATORY_CARE_PROVIDER_SITE_OTHER): Payer: BC Managed Care – PPO

## 2023-02-20 ENCOUNTER — Ambulatory Visit: Payer: BC Managed Care – PPO

## 2023-02-20 VITALS — BP 148/85 | HR 86 | Resp 15 | Ht 64.5 in | Wt 256.0 lb

## 2023-02-20 DIAGNOSIS — R768 Other specified abnormal immunological findings in serum: Secondary | ICD-10-CM | POA: Diagnosis not present

## 2023-02-20 DIAGNOSIS — M25562 Pain in left knee: Secondary | ICD-10-CM

## 2023-02-20 DIAGNOSIS — M791 Myalgia, unspecified site: Secondary | ICD-10-CM

## 2023-02-20 DIAGNOSIS — Z6841 Body Mass Index (BMI) 40.0 and over, adult: Secondary | ICD-10-CM

## 2023-02-20 DIAGNOSIS — M25561 Pain in right knee: Secondary | ICD-10-CM | POA: Diagnosis not present

## 2023-02-20 DIAGNOSIS — R5383 Other fatigue: Secondary | ICD-10-CM

## 2023-02-20 DIAGNOSIS — G8929 Other chronic pain: Secondary | ICD-10-CM

## 2023-02-20 DIAGNOSIS — R590 Localized enlarged lymph nodes: Secondary | ICD-10-CM

## 2023-02-20 DIAGNOSIS — E559 Vitamin D deficiency, unspecified: Secondary | ICD-10-CM

## 2023-02-20 DIAGNOSIS — J4551 Severe persistent asthma with (acute) exacerbation: Secondary | ICD-10-CM

## 2023-02-20 DIAGNOSIS — K581 Irritable bowel syndrome with constipation: Secondary | ICD-10-CM

## 2023-02-20 DIAGNOSIS — R053 Chronic cough: Secondary | ICD-10-CM

## 2023-02-20 DIAGNOSIS — R9389 Abnormal findings on diagnostic imaging of other specified body structures: Secondary | ICD-10-CM

## 2023-02-20 LAB — CBC WITH DIFFERENTIAL/PLATELET
Absolute Monocytes: 405 cells/uL (ref 200–950)
Basophils Absolute: 18 cells/uL (ref 0–200)
Basophils Relative: 0.4 %
Eosinophils Absolute: 60 cells/uL (ref 15–500)
Eosinophils Relative: 1.3 %
HCT: 37.3 % (ref 35.0–45.0)
Hemoglobin: 12.4 g/dL (ref 11.7–15.5)
Lymphs Abs: 1449 cells/uL (ref 850–3900)
MCH: 30.5 pg (ref 27.0–33.0)
MCHC: 33.2 g/dL (ref 32.0–36.0)
Monocytes Relative: 8.8 %
Neutrophils Relative %: 58 %
Platelets: 429 10*3/uL — ABNORMAL HIGH (ref 140–400)
RBC: 4.07 10*6/uL (ref 3.80–5.10)
RDW: 12.6 % (ref 11.0–15.0)
Total Lymphocyte: 31.5 %
WBC: 4.6 10*3/uL (ref 3.8–10.8)

## 2023-02-20 LAB — SEDIMENTATION RATE: Sed Rate: 65 mm/h — ABNORMAL HIGH (ref 0–20)

## 2023-02-20 LAB — VITAMIN D 25 HYDROXY (VIT D DEFICIENCY, FRACTURES): Vit D, 25-Hydroxy: 21 ng/mL — ABNORMAL LOW (ref 30–100)

## 2023-02-20 NOTE — Progress Notes (Signed)
Vitamin D is low at 21.  Please send a prescription for vitamin D 50,000 units once a week for 3 months.  Repeat vitamin D level in 3 months.  She should stay on vitamin D 2000 units daily after finishing the course of prescription vitamin D.

## 2023-02-21 ENCOUNTER — Other Ambulatory Visit: Payer: Self-pay | Admitting: *Deleted

## 2023-02-21 DIAGNOSIS — E559 Vitamin D deficiency, unspecified: Secondary | ICD-10-CM

## 2023-02-21 MED ORDER — VITAMIN D (ERGOCALCIFEROL) 1.25 MG (50000 UNIT) PO CAPS: 50000.0000 [IU] | ORAL_CAPSULE | ORAL | 0 refills | Status: DC

## 2023-02-21 NOTE — Telephone Encounter (Signed)
-----   Message from [facility] sent at 02/20/2023  4:13 PM EDT ----- Vitamin D is low at 21.  Please send a prescription for vitamin D 50,000 units once a week for 3 months.  Repeat vitamin D level in 3 months.  She should stay on vitamin D 2000 units daily after finishing the course of prescription vitamin D.

## 2023-02-25 ENCOUNTER — Encounter: Payer: Self-pay | Admitting: Pulmonary Disease

## 2023-02-27 LAB — CBC WITH DIFFERENTIAL/PLATELET
MCV: 91.6 fL (ref 80.0–100.0)
MPV: 8.9 fL (ref 7.5–12.5)
Neutro Abs: 2668 cells/uL (ref 1500–7800)

## 2023-02-27 LAB — TSH: TSH: 0.57 mIU/L

## 2023-02-28 NOTE — Progress Notes (Addendum)
Dr. Judeth Horn, I am forwarding the labs to you.  Her ANA is positive, all other serology negative.  Her sed rate is elevated.  ACE level is normal.  She does not have definitive diagnosis of sarcoidosis.  She had no joint inflammation at the last visit.  Please let me know if you think that she has pulmonary sarcoidosis and needs immunosuppressive therapy for it. Thank you

## 2023-02-28 NOTE — Progress Notes (Signed)
I will discuss results at the follow-up visit.

## 2023-03-06 ENCOUNTER — Telehealth: Payer: Self-pay | Admitting: Pulmonary Disease

## 2023-03-06 NOTE — Telephone Encounter (Signed)
Checking on appeal submitted for patient's [employer]

## 2023-03-13 NOTE — Telephone Encounter (Signed)
Katie Knight, please call patient with update.  Thank you.

## 2023-03-15 ENCOUNTER — Telehealth: Payer: Self-pay | Admitting: Pulmonary Disease

## 2023-03-15 NOTE — Telephone Encounter (Signed)
I just received the FMLA form today and have started filling it out. I have questions about any time missed from work, work restrictions, and future time off needed.  I called and left patient a voice message to call me back, then I will be able to complete the form and get it to Dr. Judeth Horn for signature.

## 2023-03-15 NOTE — Progress Notes (Signed)
I called patient and discussed her labs with her.  I also received a message from Dr. Judeth Horn stating that patient does not have a diagnosis for pulmonary sarcoidosis.  He would like to have a histopathological diagnosis before giving her any treatment.  I notified patient.

## 2023-03-19 DIAGNOSIS — Z0289 Encounter for other administrative examinations: Secondary | ICD-10-CM

## 2023-03-19 NOTE — Telephone Encounter (Addendum)
Patient returned my call and said she is working full time, has not been out of work for an extended period and needs to have the form to cover 4-6 appointments/year lasting up to 8 hours each (She lives in [place]).  She also needs to form to say she could have a flare-up as frequently as once a week lasting up to 12 hours.  I have completed the form and will ask Rubye Oaks, NP to sign it because Dr. Judeth Horn will not be in the office this week.  Patient has been seen by T. Parrett multiple times in the past.   I advised patient of $29 paperwork processing fee

## 2023-03-20 NOTE — Telephone Encounter (Signed)
FMLA form was signed by Rubye Oaks, NP and I have faxed to [facility] insurance fax# [phone].   Mailed hard copy to patient.

## 2023-03-21 ENCOUNTER — Ambulatory Visit: Payer: BC Managed Care – PPO | Admitting: Rheumatology

## 2023-03-26 NOTE — Progress Notes (Signed)
Office Visit Note  Patient: Katie Knight             Date of Birth: 1976-05-08           MRN: [id number]             PCP: [name], MD Referring: [name], MD Visit Date: 04/03/2023 Occupation: @GUAROCC @  Subjective:  Pain in both knees  History of Present Illness: Katie Knight is a 47 y.o. female with chronic cough and arthralgias.  She states she continues to have pain and discomfort in the bilateral knee joints.  She has difficulty walking.  She requests cortisone injection to her knee joint.  Of the other joints are painful.  She continues to have shortness of breath and chronic cough.  She has some pain in her muscles which she describes over the shin area.  None of the other joints are painful or swollen.    Activities of Daily Living:  Patient reports morning stiffness for 15 minutes.   Patient Reports nocturnal pain.  Difficulty dressing/grooming: Denies Difficulty climbing stairs: Reports Difficulty getting out of chair: Denies Difficulty using hands for taps, buttons, cutlery, and/or writing: Denies  Review of Systems  Constitutional:  Positive for fatigue.  HENT:  Positive for mouth dryness. Negative for mouth sores.   Eyes:  Negative for dryness.  Respiratory:  Positive for shortness of breath.   Cardiovascular:  Negative for chest pain and palpitations.  Gastrointestinal:  Negative for blood in stool, constipation and diarrhea.  Endocrine: Negative for increased urination.  Genitourinary:  Negative for involuntary urination.  Musculoskeletal:  Positive for joint pain, gait problem, joint pain, joint swelling, myalgias, muscle weakness, morning stiffness, muscle tenderness and myalgias.  Skin:  Negative for color change, rash, hair loss and sensitivity to sunlight.  Allergic/Immunologic: Positive for susceptible to infections.  Neurological:  Negative for dizziness and headaches.  Hematological:  Negative for swollen glands.   Psychiatric/Behavioral:  Positive for sleep disturbance. Negative for depressed mood. The patient is not nervous/anxious.     PMFS History:  Patient Active Problem List   Diagnosis Date Noted   Asthma 08/27/2022   Chronic cough 08/27/2022   Abnormal CT of the chest 08/27/2022    Past Medical History:  Diagnosis Date   Asthma    Hyperlipidemia     Family History  Problem Relation Age of Onset   Hypertension Mother    Hypertension Father    Colon cancer Neg Hx    Esophageal cancer Neg Hx    Rectal cancer Neg Hx    Stomach cancer Neg Hx    Past Surgical History:  Procedure Laterality Date   BRONCHIAL BIOPSY  04/30/2022   Procedure: BRONCHIAL BIOPSIES;  Surgeon: Karren Burly, MD;  Location: [facility];  Service: Endoscopy;;   BRONCHIAL NEEDLE ASPIRATION BIOPSY  04/30/2022   Procedure: BRONCHIAL NEEDLE ASPIRATION BIOPSIES;  Surgeon: Karren Burly, MD;  Location: [facility];  Service: Endoscopy;;   BRONCHIAL WASHINGS  04/30/2022   Procedure: BRONCHIAL WASHINGS;  Surgeon: Karren Burly, MD;  Location: [facility];  Service: Endoscopy;;   DILATION AND CURETTAGE OF UTERUS     ENDOBRONCHIAL ULTRASOUND N/A 04/30/2022   Procedure: ENDOBRONCHIAL ULTRASOUND;  Surgeon: Karren Burly, MD;  Location: [facility];  Service: Endoscopy;  Laterality: N/A;   VIDEO BRONCHOSCOPY  04/30/2022   Procedure: VIDEO BRONCHOSCOPY WITHOUT FLUORO;  Surgeon: Karren Burly, MD;  Location: [facility];  Service: Endoscopy;;   Social History   Social  History Narrative   Not on file    There is no immunization history on file for this patient.   Objective: Vital Signs: BP (!) 142/85 (BP Location: Right Arm, Patient Position: Supine, Cuff Size: Large)   Pulse 69   Resp 16   Ht 5\' 4"  (1.626 m)   Wt 266 lb 12.8 oz (121 kg)   BMI 45.80 kg/m    Physical Exam Vitals and nursing note reviewed.  Constitutional:      Appearance: She is well-developed.  HENT:      Head: Normocephalic and atraumatic.  Eyes:     Conjunctiva/sclera: Conjunctivae normal.  Cardiovascular:     Rate and Rhythm: Normal rate and regular rhythm.     Heart sounds: Normal heart sounds.  Pulmonary:     Effort: Pulmonary effort is normal.     Breath sounds: Normal breath sounds.  Abdominal:     General: Bowel sounds are normal.     Palpations: Abdomen is soft.  Musculoskeletal:     Cervical back: Normal range of motion.  Lymphadenopathy:     Cervical: No cervical adenopathy.  Skin:    General: Skin is warm and dry.     Capillary Refill: Capillary refill takes less than 2 seconds.  Neurological:     Mental Status: She is alert and oriented to person, place, and time.  Psychiatric:        Behavior: Behavior normal.      Musculoskeletal Exam: Cervical, thoracic and lumbar spine were in good range of motion.  Shoulder joints, elbow joints, wrist joints, MCPs PIPs and DIPs with good range of motion with no synovitis.  Mild DIP thickening was noted.  Hip joints and knee joints were in  good range of motion.  She had painful range of motion of bilateral knee joints without any warmth swelling or effusion.  There was no tenderness over ankles or MTPs.  CDAI Exam: CDAI Score: -- Patient Global: --; Provider Global: -- Swollen: --; Tender: -- Joint Exam 04/03/2023   No joint exam has been documented for this visit   There is currently no information documented on the homunculus. Go to the Rheumatology activity and complete the homunculus joint exam.  Investigation: No additional findings.  Imaging: No results found.  Recent Labs: Lab Results  Component Value Date   WBC 4.6 02/20/2023   HGB 12.4 02/20/2023   PLT 429 (H) 02/20/2023   NA 139 02/20/2023   K 4.6 02/20/2023   CL 104 02/20/2023   CO2 28 02/20/2023   GLUCOSE 97 02/20/2023   BUN 12 02/20/2023   CREATININE 0.76 02/20/2023   BILITOT 0.6 02/20/2023   ALKPHOS 51 04/14/2022   AST 14 02/20/2023   ALT 14  02/20/2023   PROT 7.4 02/20/2023   ALBUMIN 4.0 04/14/2022   CALCIUM 9.2 02/20/2023   GFRAA >60 02/06/2018   February 20, 2023 ANA 1: 320 NH, RNP negative, Smith negative, dsDNA negative, SCL 70 negative, C3-C4 normal, anticardiolipin negative, beta-2 GP 1 negative, ESR 65, CK 75, TSH normal, vitamin D 21, ACE 6  08/27/22: ANA 1:320NH, RF<14, anti-CCP<16, La ab-, Ro ab-, IgE WNL, hypersensitivity pneumonitis panel negative   Speciality Comments: No specialty comments available.  Procedures:  Large Joint Inj: R knee on 04/03/2023 3:18 PM Indications: pain Details: 27 G 1.5 in needle, medial approach  Arthrogram: No  Medications: 40 mg triamcinolone acetonide 40 MG/ML; 1.5 mL lidocaine 1 % Aspirate: 0 mL Outcome: tolerated well, no immediate complications Procedure,  treatment alternatives, risks and benefits explained, specific risks discussed. Consent was given by the patient. Immediately prior to procedure a time out was called to verify the correct patient, procedure, equipment, support staff and site/side marked as required. Patient was prepped and draped in the usual sterile fashion.     Allergies: Patient has no known allergies.   Assessment / Plan:     Visit Diagnoses: Positive ANA (antinuclear antibody) - February 20, 2023 ANA 1: 320 NH, RNP negative, Smith negative, dsDNA negative, SCL 70 negative, C3-C4 normal, anticardiolipin negative, beta-2 GP 1 negative, sed rate 65.  Lab results were discussed with the patient at length.  Besides positive ANA she does not have any other and autoimmune antibodies positive.  Complements are normal.  Her sed rate is elevated which could be due to several reasons.  Primary osteoarthritis of both knees -she continues to have severe pain and discomfort in her knee joints.  No warmth swelling or effusion was noted.  X-rays obtained at the last visit showed moderate to severe osteoarthritis and chondromalacia patella.  Patient played basketball in high  school.  She continues to have pain radiating into motion.  I offered cortisone injection to her knee joint.  She will also benefit from weight loss diet and exercise.  Patient was in agreement.  She wants to be referred to the pain management clinic.  We will  place the referral.  After informed consent was obtained the right knee joint was prepped in sterile fashion and injected with 1-1/2 mL of 1% lidocaine and 40 mg of Kenalog.  Patient tolerated the procedure well.  Postprocedure instructions were given.  Patient may benefit from viscosupplement injections in the future.  She will be a candidate for total knee replacement in the future if she does not have enough benefit from weight loss exercise and viscosupplement injections.  Advised patient to contact us for the left knee joint injection if she had good response to right knee joint injection.  Myalgia - She complains of discomfort in her shins most likely related to osteoarthritis in her knees.  Other fatigue - CK normal, TSH normal.  Labs were discussed with the patient.  Hilar lymphadenopathy - Noted on the CT scan of the chest.   Biopsy was negative for sarcoidosis.  Patient is followed by Dr. Judeth Horn.  Chronic cough - Persistent severe cough for the last 1 year.  Patient believes it could be related to the environmental allergies at her work.  Severe persistent asthma with acute exacerbation - Followed by Dr. Judeth Horn.  Abnormal CT of the chest  Vitamin D deficiency-she is on vitamin D 50,000 units weekly.  Will check vitamin D level in 3 months.  She will need maintenance vitamin D after finishing the course of vitamin D.  Irritable bowel syndrome with constipation  Elevated blood pressure reading-blood pressure readings have been elevated for the last several visits.  She may need evaluation for hypertension.  I advised her to monitor blood pressure closely and follow-up with the PCP.  BMI 40.0-44.9, adult (HCC)-I will refer her  to weight management clinic.  Orders: Orders Placed This Encounter  Procedures   Large Joint Inj   Amb Ref to Medical Weight Management   No orders of the defined types were placed in this encounter.    Follow-Up Instructions: Return in about 3 months (around 07/03/2023).   Pollyann Savoy, MD  Note - This record has been created using Animal nutritionist.  Chart creation errors have  been sought, but may not always  have been located. Such creation errors do not reflect on  the standard of medical care.

## 2023-04-02 NOTE — Telephone Encounter (Signed)
Pt has f/u appt on 05/06/2023. Can re-attempt PA after this OV

## 2023-04-03 ENCOUNTER — Ambulatory Visit: Payer: BC Managed Care – PPO | Attending: Rheumatology | Admitting: Rheumatology

## 2023-04-03 ENCOUNTER — Encounter: Payer: Self-pay | Admitting: Rheumatology

## 2023-04-03 VITALS — BP 142/85 | HR 69 | Resp 16 | Ht 64.0 in | Wt 266.8 lb

## 2023-04-03 DIAGNOSIS — M170 Bilateral primary osteoarthritis of knee: Secondary | ICD-10-CM

## 2023-04-03 DIAGNOSIS — M1711 Unilateral primary osteoarthritis, right knee: Secondary | ICD-10-CM | POA: Diagnosis not present

## 2023-04-03 DIAGNOSIS — E559 Vitamin D deficiency, unspecified: Secondary | ICD-10-CM

## 2023-04-03 DIAGNOSIS — R053 Chronic cough: Secondary | ICD-10-CM

## 2023-04-03 DIAGNOSIS — R5383 Other fatigue: Secondary | ICD-10-CM

## 2023-04-03 DIAGNOSIS — R7689 Other specified abnormal immunological findings in serum: Secondary | ICD-10-CM

## 2023-04-03 DIAGNOSIS — G8929 Other chronic pain: Secondary | ICD-10-CM

## 2023-04-03 DIAGNOSIS — R768 Other specified abnormal immunological findings in serum: Secondary | ICD-10-CM | POA: Diagnosis not present

## 2023-04-03 DIAGNOSIS — Z6841 Body Mass Index (BMI) 40.0 and over, adult: Secondary | ICD-10-CM

## 2023-04-03 DIAGNOSIS — M791 Myalgia, unspecified site: Secondary | ICD-10-CM | POA: Diagnosis not present

## 2023-04-03 DIAGNOSIS — J4551 Severe persistent asthma with (acute) exacerbation: Secondary | ICD-10-CM

## 2023-04-03 DIAGNOSIS — R030 Elevated blood-pressure reading, without diagnosis of hypertension: Secondary | ICD-10-CM

## 2023-04-03 DIAGNOSIS — K581 Irritable bowel syndrome with constipation: Secondary | ICD-10-CM

## 2023-04-03 DIAGNOSIS — R9389 Abnormal findings on diagnostic imaging of other specified body structures: Secondary | ICD-10-CM

## 2023-04-03 DIAGNOSIS — R590 Localized enlarged lymph nodes: Secondary | ICD-10-CM

## 2023-04-03 MED ORDER — LIDOCAINE HCL 1 % IJ SOLN
1.5000 mL | INTRAMUSCULAR | Status: AC | PRN
Start: 2023-04-03 — End: 2023-04-03
  Administered 2023-04-03: 1.5 mL

## 2023-04-03 MED ORDER — TRIAMCINOLONE ACETONIDE 40 MG/ML IJ SUSP
40.0000 mg | INTRAMUSCULAR | Status: AC | PRN
Start: 2023-04-03 — End: 2023-04-03
  Administered 2023-04-03: 40 mg via INTRA_ARTICULAR

## 2023-04-03 NOTE — Patient Instructions (Signed)
Exercises for Chronic Knee Pain Chronic knee pain is pain that lasts longer than 3 months. For most people with chronic knee pain, exercise and weight loss is an important part of treatment. Your health care provider may want you to focus on: Making the muscles that support your knee stronger. This can take pressure off your knee and reduce pain. Preventing knee stiffness. How far you can move your knee, keeping it there or making it farther. Losing weight (if this applies) to take pressure off your knee, lower your risk for injury, and make it easier for you to exercise. Your provider will help you make an exercise program that fits your needs and physical abilities. Below are simple, low-impact exercises you can do at home. Ask your provider or physical therapist how often you should do your exercise program and how many times to repeat each exercise. General safety tips  Get your provider's approval before doing any exercises. Start slowly and stop any time you feel pain. Do not exercise if your knee pain is flaring up. Warm up first. Stretching a cold muscle can cause an injury. Do 5-10 minutes of easy movement or light stretching before beginning your exercises. Do 5-10 minutes of low-impact activity (like walking or cycling) before starting strengthening exercises. Contact your provider any time you have pain during or after exercising. Exercise can cause discomfort but should not be painful. It is normal to be a little stiff or sore after exercising. Stretching and range-of-motion exercises Front thigh stretch  Stand up straight and support your body by holding on to a chair or resting one hand on a wall. With your legs straight and close together, bend one knee to lift your heel up toward your butt. Using one hand for support, grab your ankle with your free hand. Pull your foot up closer toward your butt to feel the stretch in front of your thigh. Hold the stretch for 30  seconds. Repeat __________ times. Complete this exercise __________ times a day. Back thigh stretch  Sit on the floor with your back straight and your legs out straight in front of you. Place the palms of your hands on the floor and slide them toward your feet as you bend at the hip. Try to touch your nose to your knees and feel the stretch in the back of your thighs. Hold for 30 seconds. Repeat __________ times. Complete this exercise __________ times a day. Calf stretch  Stand facing a wall. Place the palms of your hands flat against the wall, arms extended, and lean slightly against the wall. Get into a lunge position with one leg bent at the knee and the other leg stretched out straight behind you. Keep both feet facing the wall and increase the bend in your knee while keeping the heel of the other leg flat on the ground. You should feel the stretch in your calf. Hold for 30 seconds. Repeat __________ times. Complete this exercise __________ times a day. Strengthening exercises Straight leg lift  Lie on your back with one knee bent and the other leg out straight. Slowly lift the straight leg without bending the knee. Lift until your foot is about 12 inches (30 cm) off the floor. Hold for 3-5 seconds and slowly lower your leg. Repeat __________ times. Complete this exercise __________ times a day. Single leg dip  Stand between two chairs and put both hands on the backs of the chairs for support. Extend one leg out straight with your body  weight resting on the heel of the standing leg. Slowly bend your standing knee to dip your body to the level that is comfortable for you. Hold for 3-5 seconds. Repeat __________ times. Complete this exercise __________ times a day. Hamstring curls  Stand straight, knees close together, facing the back of a chair. Hold on to the back of a chair with both hands. Keep one leg straight. Bend the other knee while bringing the heel up toward the butt  until the knee is bent at a 90-degree angle (right angle). Hold for 3-5 seconds. Repeat __________ times. Complete this exercise __________ times a day. Wall squat  Stand straight with your back, hips, and head against a wall. Step forward one foot at a time with your back still against the wall. Your feet should be 2 feet (61 cm) from the wall at shoulder width. Keeping your back, hips, and head against the wall, slide down the wall to as close to a sitting position as you can get. Hold for 5-10 seconds, then slowly slide back up. Repeat __________ times. Complete this exercise __________ times a day. Step-ups  Stand in front of a sturdy platform or stool that is about 6 inches (15 cm) high. Slowly step up with your left / right foot, keeping your knee in line with your hip and foot. Do not let your knee bend so far that you cannot see your toes. Hold on to a chair for balance, but do not use it for support. Slowly unlock your knee and lower yourself to the starting position. Repeat __________ times. Complete this exercise __________ times a day. Contact a health care provider if: Your exercises cause pain. Your pain is worse after you exercise. Your pain prevents you from doing your exercises. This information is not intended to replace advice given to you by your health care provider. Make sure you discuss any questions you have with your health care provider. Document Revised: 07/17/2022 Document Reviewed: 07/17/2022 Elsevier Patient Education  2024 [employer].

## 2023-04-05 ENCOUNTER — Other Ambulatory Visit: Payer: Self-pay | Admitting: Pulmonary Disease

## 2023-04-22 DIAGNOSIS — Z0289 Encounter for other administrative examinations: Secondary | ICD-10-CM

## 2023-04-23 ENCOUNTER — Ambulatory Visit (INDEPENDENT_AMBULATORY_CARE_PROVIDER_SITE_OTHER): Payer: BC Managed Care – PPO | Admitting: Family Medicine

## 2023-04-23 ENCOUNTER — Encounter: Payer: Self-pay | Admitting: Family Medicine

## 2023-04-23 VITALS — BP 142/84 | HR 91 | Temp 97.8°F | Ht 64.0 in | Wt 260.0 lb

## 2023-04-23 DIAGNOSIS — G8929 Other chronic pain: Secondary | ICD-10-CM | POA: Diagnosis not present

## 2023-04-23 DIAGNOSIS — M25561 Pain in right knee: Secondary | ICD-10-CM | POA: Diagnosis not present

## 2023-04-23 DIAGNOSIS — R030 Elevated blood-pressure reading, without diagnosis of hypertension: Secondary | ICD-10-CM | POA: Insufficient documentation

## 2023-04-23 DIAGNOSIS — E66813 Obesity, class 3: Secondary | ICD-10-CM

## 2023-04-23 DIAGNOSIS — J4550 Severe persistent asthma, uncomplicated: Secondary | ICD-10-CM

## 2023-04-23 DIAGNOSIS — Z6841 Body Mass Index (BMI) 40.0 and over, adult: Secondary | ICD-10-CM

## 2023-04-23 DIAGNOSIS — M25562 Pain in left knee: Secondary | ICD-10-CM

## 2023-04-23 NOTE — Assessment & Plan Note (Signed)
BP elevated today. She is no longer on phentermine. Denies CP or HA. She has been taking NSAIDs daily for knee pain  Monitor BP readings next visit Look for improvements with weight loss

## 2023-04-23 NOTE — Assessment & Plan Note (Signed)
Managed by Dr Judeth Horn. Her asthma has hindered her exercise for several years and she notes weight gain from use of corticosteroids in the past.  Continue management by pulmonology

## 2023-04-23 NOTE — Assessment & Plan Note (Signed)
Bilateral knee pain has limited her exercise and this has been a barrier to weight loss. She had bilateral corticosteroid injections by rheumatology and notes no improvements.  Begin active plan for weight reduction, looking for improvements in weight - bearing joint pain Consider yoga, water exercise, bike to alleviate knee pain

## 2023-04-23 NOTE — Progress Notes (Signed)
Office: [phone]  /  Fax: [phone]   Initial Visit  Katie Knight was seen in clinic today to evaluate for obesity. She is interested in losing weight to improve overall health and reduce the risk of weight related complications. She presents today to review program treatment options, initial physical assessment, and evaluation.     She was referred by: Specialist  When asked what else they would like to accomplish? She states: Improve quality of life  Weight history: weight up and down from 325 lb to 168 lb.  Job active at [employer].  Not overweight as a child- was athletic.  She did well with gym workouts in the past.  Now having joint pains.    When asked how has your weight affected you? She states: Contributed to orthopedic problems or mobility issues, Having fatigue, and Having poor endurance  Some associated conditions: Vitamin D Deficiency and Other: asthma  Contributing factors: Nutritional, Medications, and Reduced physical activity  Weight promoting medications identified: Steroids  Current nutrition plan: None  Current level of physical activity: Strength training and NEAT  Current or previous pharmacotherapy: Phentermine  stopped working over time  Response to medication: Lost weight initially but was unable to sustain weight loss   Past medical history includes:   Past Medical History:  Diagnosis Date   Asthma    Hyperlipidemia      Objective:   BP (!) 142/84   Pulse 91   Temp 97.8 F (36.6 C)   Ht 5\' 4"  (1.626 m)   Wt 260 lb (117.9 kg)   SpO2 98%   BMI 44.63 kg/m  She was weighed on the bioimpedance scale: Body mass index is 44.63 kg/m.  Peak Weight:325 , Body Fat%:50.8, Visceral Fat Rating:16, Weight trend over the last 12 months: Unchanged  General:  Alert, oriented and cooperative. Patient is in no acute distress.  Respiratory: Normal respiratory effort, no problems with respiration noted   Gait: able to ambulate independently   Mental Status: Normal mood and affect. Normal behavior. Normal judgment and thought content.   DIAGNOSTIC DATA REVIEWED:  BMET    Component Value Date/Time   NA 139 02/20/2023 1119   K 4.6 02/20/2023 1119   CL 104 02/20/2023 1119   CO2 28 02/20/2023 1119   GLUCOSE 97 02/20/2023 1119   BUN 12 02/20/2023 1119   CREATININE 0.76 02/20/2023 1119   CALCIUM 9.2 02/20/2023 1119   GFRNONAA >60 04/14/2022 1607   GFRAA >60 02/06/2018 2019   No results found for: "HGBA1C" No results found for: "INSULIN" CBC    Component Value Date/Time   WBC 4.6 02/20/2023 1119   RBC 4.07 02/20/2023 1119   HGB 12.4 02/20/2023 1119   HCT 37.3 02/20/2023 1119   PLT 429 (H) 02/20/2023 1119   MCV 91.6 02/20/2023 1119   MCH 30.5 02/20/2023 1119   MCHC 33.2 02/20/2023 1119   RDW 12.6 02/20/2023 1119   Iron/TIBC/Ferritin/ %Sat No results found for: "IRON", "TIBC", "FERRITIN", "IRONPCTSAT" Lipid Panel  No results found for: "CHOL", "TRIG", "HDL", "CHOLHDL", "VLDL", "LDLCALC", "LDLDIRECT" Hepatic Function Panel     Component Value Date/Time   PROT 7.4 02/20/2023 1119   ALBUMIN 4.0 04/14/2022 1607   AST 14 02/20/2023 1119   ALT 14 02/20/2023 1119   ALKPHOS 51 04/14/2022 1607   BILITOT 0.6 02/20/2023 1119      Component Value Date/Time   TSH 0.57 02/20/2023 1119     Assessment and Plan:   Chronic pain of both  knees Assessment & Plan: Bilateral knee pain has limited her exercise and this has been a barrier to weight loss. She had bilateral corticosteroid injections by rheumatology and notes no improvements.  Begin active plan for weight reduction, looking for improvements in weight - bearing joint pain Consider yoga, water exercise, bike to alleviate knee pain    Class 3 severe obesity due to excess calories without serious comorbidity with body mass index (BMI) of 40.0 to 44.9 in adult [facility])  Severe persistent asthma, unspecified whether complicated Assessment & Plan: Managed by Dr  Judeth Horn. Her asthma has hindered her exercise for several years and she notes weight gain from use of corticosteroids in the past.  Continue management by pulmonology    Elevated blood pressure reading Assessment & Plan: BP elevated today. She is no longer on phentermine. Denies CP or HA. She has been taking NSAIDs daily for knee pain  Monitor BP readings next visit Look for improvements with weight loss         Obesity Treatment / Action Plan:  Patient will work on garnering support from family and friends to begin weight loss journey. Will work on eliminating or reducing the presence of highly palatable, calorie dense foods in the home. Will complete provided nutritional and psychosocial assessment questionnaire before the next appointment. Will be scheduled for indirect calorimetry to determine resting energy expenditure in a fasting state.  This will allow [place] to create a reduced calorie, high-protein meal plan to promote loss of fat mass while preserving muscle mass. Will think about ideas on how to incorporate physical activity into their daily routine. Counseled on the health benefits of losing 5%-15% of total body weight. Was counseled on nutritional approaches to weight loss and benefits of reducing processed foods and consuming plant-based foods and high quality protein as part of nutritional weight management. Was counseled on pharmacotherapy and role as an adjunct in weight management.   Obesity Education Performed Today:  She was weighed on the bioimpedance scale and results were discussed and documented in the synopsis.  We discussed obesity as a disease and the importance of a more detailed evaluation of all the factors contributing to the disease.  We discussed the importance of long term lifestyle changes which include nutrition, exercise and behavioral modifications as well as the importance of customizing this to her specific health and social needs.  We  discussed the benefits of reaching a healthier weight to alleviate the symptoms of existing conditions and reduce the risks of the biomechanical, metabolic and psychological effects of obesity.  Katie Knight appears to be in the action stage of change and states they are ready to start intensive lifestyle modifications and behavioral modifications.  30 minutes was spent today on this visit including the above counseling, pre-visit chart review, and post-visit documentation.  Reviewed by clinician on day of visit: allergies, medications, problem list, medical history, surgical history, family history, social history, and previous encounter notes pertinent to obesity diagnosis.    Seymour Bars, D.O. DABFM, DABOM [facility] [phone] [place], [place] [address] [phone]

## 2023-05-06 ENCOUNTER — Encounter: Payer: Self-pay | Admitting: Pulmonary Disease

## 2023-05-06 ENCOUNTER — Ambulatory Visit (INDEPENDENT_AMBULATORY_CARE_PROVIDER_SITE_OTHER): Payer: BC Managed Care – PPO | Admitting: Pulmonary Disease

## 2023-05-06 VITALS — BP 136/76 | HR 86 | Temp 98.0°F | Ht 65.0 in | Wt 264.8 lb

## 2023-05-06 DIAGNOSIS — F419 Anxiety disorder, unspecified: Secondary | ICD-10-CM | POA: Diagnosis not present

## 2023-05-06 DIAGNOSIS — R053 Chronic cough: Secondary | ICD-10-CM | POA: Diagnosis not present

## 2023-05-06 DIAGNOSIS — J4550 Severe persistent asthma, uncomplicated: Secondary | ICD-10-CM

## 2023-05-06 DIAGNOSIS — R9389 Abnormal findings on diagnostic imaging of other specified body structures: Secondary | ICD-10-CM | POA: Diagnosis not present

## 2023-05-06 NOTE — Patient Instructions (Signed)
Nice to see you again  No changes to medication  I ordered a special CT scan of the chest to look into possible damage or changes related to inhalation of particles at work  I sent a referral to an ENT doctor at [facility] and is in [place] specializing in cough to see if there is anything else that can be added that I cannot think of her help with  Return to clinic in 4 months or sooner as needed, based on the CT scan we may need to change follow-up

## 2023-05-06 NOTE — Progress Notes (Signed)
@Patient  ID: Katie Knight, female    DOB: 01-20-1976, 47 y.o.   MRN: [id number]  Chief Complaint  Patient presents with   Follow-up    Cough and SOB persistent.    Referring provider: [name], MD  HPI:   47 y.o. woman whom we are seeing in follow up for evaluation of cough, dyspnea on exertion, asthma.  Rheumatology note reviewed.  Cough waxes and wanes.  We discussed in detail today and asked again if it seems related to work exposure.  She states she just worked 3 days over the weekend and it was really rough.  Inhale a lot of "carbon black."  Discussed CT scan prior concern for sarcoidosis.  Biopsy negative on EBUS.  Some concern for HSP given prior CT pattern as well as an ongoing inhalational injury versus just asthma.  Of course just asthma would not have the CT abnormalities.  Discussed repeat CT and possible repeat bronchoscopy if needed.  Referral to voice center and [place], [facility] for ongoing help with cough in addition to our interventions.  Discussed using prednisone as needed, she has some on hand if needed.  HPI at initial visit: Noted shortness of breath for the last several months.  With exertion.  Worse on inclines or stairs.  Not much history at rest.  No time of day when things are better or worse.  No position make things better or worse.  Recent prescribed albuterol with mild improvement in symptoms.  No other alleviating or exacerbating factors.  Associated symptoms of cough.  Seems worse in the morning.  Mildly productive.  Not much of an issue today.  Endorses nasal congestion, postnasal drip.  Denies reflux.  Work-up today includes EKG treadmill stress test that was normal 12/2021.  Most recent chest image seen/x-ray 12/27/2021 personally reviewed and interpreted as tenting of the right diaphragm with linear infiltrates most consistent with atelectasis given evidence of volume loss, reviewed reported concern for pneumonia.  Reviewed TTE 2019  that is largely normal.  Reviewed CT chest 02/2018 revealed scattered diffuse peribronchovascular groundglass nodules on my interpretation.  PMH: Asthma Surgical history: D&C Family history: Father with hypertension Social history: Never smoker, lives in [facility]  Questionaires / Pulmonary Flowsheets:   ACT:  Asthma Control Test ACT Total Score  03/26/2022  9:55 AM 15  01/19/2022  3:41 PM 18    MMRC:     No data to display          Epworth:      No data to display          Tests:   FENO:  No results found for: "NITRICOXIDE"  PFT:    Latest Ref Rng & Units 10/19/2022   11:38 AM  PFT Results  FVC-Pre L 2.72   FVC-Predicted Pre % 69   FVC-Post L 2.55   FVC-Predicted Post % 65   Pre FEV1/FVC % % 80   Post FEV1/FCV % % 86   FEV1-Pre L 2.17   FEV1-Predicted Pre % 69   FEV1-Post L 2.19   DLCO uncorrected ml/min/mmHg 17.48   DLCO UNC% % 75   DLCO corrected ml/min/mmHg 17.48   DLCO COR %Predicted % 75   DLVA Predicted % 103   TLC L 4.01   TLC % Predicted % 74   RV % Predicted % 74     WALK:      No data to display  Imaging: Personally reviewed and as per EMR discussion this note No results found.  Lab Results: Personally reviewed CBC    Component Value Date/Time   WBC 4.6 02/20/2023 1119   RBC 4.07 02/20/2023 1119   HGB 12.4 02/20/2023 1119   HCT 37.3 02/20/2023 1119   PLT 429 (H) 02/20/2023 1119   MCV 91.6 02/20/2023 1119   MCH 30.5 02/20/2023 1119   MCHC 33.2 02/20/2023 1119   RDW 12.6 02/20/2023 1119   LYMPHSABS 1,449 02/20/2023 1119   MONOABS 0.5 04/14/2022 1607   EOSABS 60 02/20/2023 1119   BASOSABS 18 02/20/2023 1119    BMET    Component Value Date/Time   NA 139 02/20/2023 1119   K 4.6 02/20/2023 1119   CL 104 02/20/2023 1119   CO2 28 02/20/2023 1119   GLUCOSE 97 02/20/2023 1119   BUN 12 02/20/2023 1119   CREATININE 0.76 02/20/2023 1119   CALCIUM 9.2 02/20/2023 1119   GFRNONAA >60 04/14/2022  1607   GFRAA >60 02/06/2018 2019    BNP No results found for: "BNP"  ProBNP No results found for: "PROBNP"  Specialty Problems       Pulmonary Problems   Asthma   Chronic cough     No Known Allergies   There is no immunization history on file for this patient.  Past Medical History:  Diagnosis Date   Asthma    Hyperlipidemia     Tobacco History: Social History   Tobacco Use  Smoking Status Never   Passive exposure: Past  Smokeless Tobacco Never   Counseling given: Not Answered   Continue to not smoke  Outpatient Encounter Medications as of 05/06/2023  Medication Sig   budesonide-formoterol (SYMBICORT) 160-4.5 MCG/ACT inhaler Inhale 2 puffs into the lungs in the morning and at bedtime.   fluticasone (FLONASE) 50 MCG/ACT nasal spray Place 1 spray into both nostrils 2 (two) times daily.   LINZESS 145 MCG CAPS capsule Take 145 mcg by mouth daily.   Multiple Vitamins-Minerals (MULTIVITAMIN WITH MINERALS) tablet Take 1 tablet by mouth daily. gummy   oxymetazoline (AFRIN) 0.05 % nasal spray Place 1 spray into both nostrils daily as needed for congestion.   phentermine 30 MG capsule Take 30 mg by mouth daily.   PROAIR HFA 108 (90 Base) MCG/ACT inhaler Inhale 2 puffs into the lungs every 4 (four) hours as needed for wheezing or shortness of breath.   Tezepelumab-ekko (TEZSPIRE) 210 MG/1. SOAJ Inject 210 mg into the skin every 28 (twenty-eight) days.   Vitamin D, Ergocalciferol, (DRISDOL) 1.25 MG (50000 UNIT) CAPS capsule Take 1 capsule (50,000 Units total) by mouth every 7 (seven) days.   No facility-administered encounter medications on file as of 05/06/2023.     Review of Systems  Review of Systems  N/a Physical Exam  BP 136/76 (BP Location: Right Arm, Patient Position: Sitting, Cuff Size: Large)   Pulse 86   Temp 98 F (36.7 C) (Oral)   Ht 5\' 5"  (1.651 m)   Wt 264 lb 12.8 oz (120.1 kg)   SpO2 100%   BMI 44.07 kg/m   Wt Readings from Last 5  Encounters:  05/06/23 264 lb 12.8 oz (120.1 kg)  04/23/23 260 lb (117.9 kg)  04/03/23 266 lb 12.8 oz (121 kg)  02/20/23 256 lb (116.1 kg)  02/01/23 238 lb (108 kg)    BMI Readings from Last 5 Encounters:  05/06/23 44.07 kg/m  04/23/23 44.63 kg/m  04/03/23 45.80 kg/m  02/20/23 43.26 kg/m  02/01/23 38.41  kg/m     Physical Exam General: Well-appearing, no acute distress Eyes: EOMI, no icterus Neck: Supple, no JVP Pulmonary: Clear, bronco spasmodic cough with deep inhalation, normal work of breathing Cardiovascular: Warm, no edema Abdomen: Nondistended, bowel sounds present MSK: Normal joints, no effusion, no synovitis  Neuro: Normal gait, no weakness Psych: Normal mood, full affect   Assessment & Plan:   Dyspnea on exertion: Fear related to poorly controlled asthma.  Initially improved with ICS/LABA therapy.  Other considerations include deconditioning.  Now with persistent endobronchial nodules and mild lymphadenopathy possible sarcoidosis as cause versus history of worsening symptoms with inhalational exposure at work concern for HSP.  Bronchoscopy with EBUS 04/2022 without any evidence of sarcoidosis on biopsy nor suggestive on cell count.  PFT 10/2022 with mild restriction and mild reduced DLCO.  Cough suspect related to poorly controlled asthma due to occupational exposure with exacerbation: Historically improved with time off from work and seems worse at work with heat humidity and dust exposure.  With some interval improvement with ICS/LABA.Marland Kitchen  Also with postnasal drip likely contributing.  Unfortunate despite high-dose ICS/LABA therapy with mild improvement initially symptoms have worsened.  Did not respond well to aggressive intranasal regimen.   Some improvement with steroids in past. Started Tezspire 12/2022 for uncontrolled cough variant asthma. Continue ICS/LABA via.  Overall it seems like symptoms related to work exposure or at least worsening with work exposure.   Continue ICS/LABA and test prior.  Referral to ENT at [facility] for evaluation of cough at voice center.  Scattered endobronchial nodules, groundglass: Possible sarcoidosis with some lymphadenopathy.  I also worry about HSP given work exposure worsening with exposure at work inhalations etc.  Cough has not responded well to inhaled therapies and intranasal therapies.  Size of lymph nodes had shrunk at time of EBUS favoring more reactive lymphadenopathy.  No evidence of sarcoidosis on bronchoscopy studies.  Given report of worsening symptoms with inhalational or work exposure, hypersensitivity pneumonitis is quite possible.  Repeat CT scan, high-resolution.   Return in about 4 months (around 09/06/2023) for f/u Dr. Judeth Horn.   Karren Burly, MD 05/06/2023

## 2023-05-07 ENCOUNTER — Other Ambulatory Visit: Payer: Self-pay

## 2023-05-07 ENCOUNTER — Ambulatory Visit (HOSPITAL_COMMUNITY)
Admission: RE | Admit: 2023-05-07 | Discharge: 2023-05-07 | Disposition: A | Discharge status: Home / Self Care | Payer: BC Managed Care – PPO | Source: Ambulatory Visit | Attending: Family Medicine

## 2023-05-07 ENCOUNTER — Encounter: Payer: Self-pay | Admitting: Family Medicine

## 2023-05-07 ENCOUNTER — Telehealth: Payer: BC Managed Care – PPO | Admitting: Pharmacist

## 2023-05-07 ENCOUNTER — Ambulatory Visit: Payer: BC Managed Care – PPO | Admitting: Family Medicine

## 2023-05-07 VITALS — BP 138/83 | HR 75 | Temp 97.8°F | Ht 65.0 in | Wt 262.0 lb

## 2023-05-07 DIAGNOSIS — R0683 Snoring: Secondary | ICD-10-CM | POA: Insufficient documentation

## 2023-05-07 DIAGNOSIS — R5383 Other fatigue: Secondary | ICD-10-CM | POA: Diagnosis not present

## 2023-05-07 DIAGNOSIS — E559 Vitamin D deficiency, unspecified: Secondary | ICD-10-CM | POA: Insufficient documentation

## 2023-05-07 DIAGNOSIS — Z1331 Encounter for screening for depression: Secondary | ICD-10-CM | POA: Diagnosis not present

## 2023-05-07 DIAGNOSIS — Z6841 Body Mass Index (BMI) 40.0 and over, adult: Secondary | ICD-10-CM | POA: Insufficient documentation

## 2023-05-07 DIAGNOSIS — R0602 Shortness of breath: Secondary | ICD-10-CM | POA: Diagnosis not present

## 2023-05-07 DIAGNOSIS — J4550 Severe persistent asthma, uncomplicated: Secondary | ICD-10-CM | POA: Diagnosis not present

## 2023-05-07 NOTE — Telephone Encounter (Signed)
Re-attempting [employer]. She is currently receiving through [employer]. Submitted a Prior Authorization request to [facility] for TEZSPIRE via CoverMyMeds. Will update once we receive a response.  Key: [id number]  Per response: [employer] does not handle this review. Please contact [employer] at [phone].  PA form for [employer] and faxed w clinicals today. Fax: [phone] Phone: [phone]  Chesley Mires, PharmD, MPH, BCPS, CPP Clinical Pharmacist (Rheumatology and Pulmonology)

## 2023-05-07 NOTE — Progress Notes (Signed)
Chief Complaint:   OBESITY Katie Knight (MR# [id number]) is a 47 y.o. female who presents for evaluation and treatment of obesity and related comorbidities. Current BMI is Body mass index is 43.6 kg/m. Katie Knight has been struggling with her weight for many years and has been unsuccessful in either losing weight, maintaining weight loss, or reaching her healthy weight goal.  Katie Knight is currently in the action stage of change and ready to dedicate time achieving and maintaining a healthier weight. Katie Knight is interested in becoming our patient and working on intensive lifestyle modifications including (but not limited to) diet and exercise for weight loss.  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  She is married with 2 children ages 37 and 44.  She started to gain weight in her late 38s.  She dislikes vegetables, and she has a history of restrictive eating.  Katie Knight's habits were reviewed today and are as follows: Her family eats meals together, she started gaining excessive weight in her late 20's, her heaviest weight ever was 326 pounds, she is a picky eater and doesn't like to eat healthier foods, she has significant food cravings issues, and she frequently makes poor food choices.  Depression Screen Katie Knight's Food and Mood (modified PHQ-9) score was 3.  Subjective:   1. Other fatigue Katie Knight admits to daytime somnolence and admits to waking up still tired. Patient has a history of symptoms of daytime fatigue, morning fatigue, and morning headache. Katie Knight generally gets 5 hours of sleep per night, and states that she has nightime awakenings. Snoring is present. Apneic episodes are present. Epworth Sleepiness Score is 14.  EKG: Normal sinus rhythm at 70 bpm.  2. SOBOE (shortness of breath on exertion) Myer Peer notes increasing shortness of breath with exercising and seems to be worsening over time with weight gain. She notes getting out  of breath sooner with activity than she used to. This has not gotten worse recently. Tahesha denies shortness of breath at rest or orthopnea.  3. Severe persistent asthma, unspecified whether complicated Patient is on test prior 210 mg every 28 days.  She uses albuterol HFA as needed.  She denies exercise limitations.  4. Vitamin D deficiency Patient is on OTC vitamin D 5000 IU daily.  She reports improved energy levels.  5. Snoring Patient averages 5 hours of sleep at night with daytime fatigue and snoring.  She works nights.  She notes her sleep is improved on her days off.  Patient's Epworth score is 14.  Assessment/Plan:   1. Other fatigue Katie Knight does feel that her weight is causing her energy to be lower than it should be. Fatigue may be related to obesity, depression or many other causes. Labs will be ordered, and in the meanwhile, Katie Knight will focus on self care including making healthy food choices, increasing physical activity and focusing on stress reduction.  - EKG 12-Lead - VITAMIN D 25 Hydroxy (Vit-D Deficiency, Fractures) - Lipid panel - Insulin, random - Hemoglobin A1c - Folate - Comprehensive metabolic panel - Vitamin B12  2. SOBOE (shortness of breath on exertion) Katie Knight does feel that she gets out of breath more easily that she used to when she exercises. Katie Knight's shortness of breath appears to be obesity related and exercise induced. She has agreed to work on weight loss and gradually increase exercise to treat her exercise induced shortness of breath. Will continue to monitor closely.  3. Severe persistent asthma, unspecified whether complicated  Patient will continue her asthma management per Dr. Judeth Horn.  4. Vitamin D deficiency We will check labs today, with a goal of 50-70.  - VITAMIN D 25 Hydroxy (Vit-D Deficiency, Fractures)  5. Snoring Patient is to work on improving her sleep time to 6 hours per night.  6. Depression screen Corda had a  negative depression screening.   7. BMI 40.0-44.9, adult (HCC)  8. Morbid obesity with starting BMI 43.7 Katie Knight is currently in the action stage of change and her goal is to continue with weight loss efforts. I recommend Katie Knight begin the structured treatment plan as follows:  She has agreed to the Category 3 Plan.  100-calorie snack list was given.   Exercise goals: All adults should avoid inactivity. Some physical activity is better than none, and adults who participate in any amount of physical activity gain some health benefits.   Behavioral modification strategies: increasing lean protein intake, decreasing simple carbohydrates, increasing water intake, decreasing liquid calories, decreasing eating out, no skipping meals, meal planning and cooking strategies, keeping healthy foods in the home, better snacking choices, planning for success, and decreasing junk food.  She was informed of the importance of frequent follow-up visits to maximize her success with intensive lifestyle modifications for her multiple health conditions. She was informed we would discuss her lab results at her next visit unless there is a critical issue that needs to be addressed sooner. Grabriela agreed to keep her next visit at the agreed upon time to discuss these results.  Objective:   Blood pressure 138/83, pulse 75, temperature 97.8 F (36.6 C), height 5\' 5"  (1.651 m), weight 262 lb (118.8 kg), SpO2 97%. Body mass index is 43.6 kg/m.  EKG: Normal sinus rhythm, rate 70 BPM.  Indirect Calorimeter completed today shows a VO2 of 247 and a REE of 1699.  Her calculated basal metabolic rate is 4401 thus her basal metabolic rate is worse than expected.  General: Cooperative, alert, well developed, in no acute distress. HEENT: Conjunctivae and lids unremarkable. Cardiovascular: Regular rhythm.  Lungs: Normal work of breathing. Neurologic: No focal deficits.   Lab Results  Component Value Date   CREATININE  0.76 02/20/2023   BUN 12 02/20/2023   NA 139 02/20/2023   K 4.6 02/20/2023   CL 104 02/20/2023   CO2 28 02/20/2023   Lab Results  Component Value Date   ALT 14 02/20/2023   AST 14 02/20/2023   ALKPHOS 51 04/14/2022   BILITOT 0.6 02/20/2023   No results found for: "HGBA1C" No results found for: "INSULIN" Lab Results  Component Value Date   TSH 0.57 02/20/2023   No results found for: "CHOL", "HDL", "LDLCALC", "LDLDIRECT", "TRIG", "CHOLHDL" Lab Results  Component Value Date   WBC 4.6 02/20/2023   HGB 12.4 02/20/2023   HCT 37.3 02/20/2023   MCV 91.6 02/20/2023   PLT 429 (H) 02/20/2023   No results found for: "IRON", "TIBC", "FERRITIN"  Attestation Statements:   Reviewed by clinician on day of visit: allergies, medications, problem list, medical history, surgical history, family history, social history, and previous encounter notes.  Time spent on visit including pre-visit chart review and post-visit charting and care was 40 minutes.   Trude Mcburney, am acting as transcriptionist for Seymour Bars, DO.  I have reviewed the above documentation for accuracy and completeness, and I agree with the above. Seymour Bars, DO

## 2023-05-08 LAB — LIPID PANEL
Chol/HDL Ratio: 3 ratio (ref 0.0–4.4)
Cholesterol, Total: 135 mg/dL (ref 100–199)
HDL: 45 mg/dL (ref >39)
LDL Chol Calc (NIH): 78 mg/dL (ref 0–99)
Triglycerides: 54 mg/dL (ref 0–149)
VLDL Cholesterol Cal: 12 mg/dL (ref 5–40)

## 2023-05-08 LAB — COMPREHENSIVE METABOLIC PANEL
ALT: 11 [IU]/L (ref 0–32)
AST: 16 [IU]/L (ref 0–40)
Albumin: 3.9 g/dL (ref 3.9–4.9)
Alkaline Phosphatase: 70 [IU]/L (ref 44–121)
BUN/Creatinine Ratio: 19 (ref 9–23)
BUN: 15 mg/dL (ref 6–24)
Bilirubin Total: 0.3 mg/dL (ref 0.0–1.2)
CO2: 23 mmol/L (ref 20–29)
Calcium: 8.9 mg/dL (ref 8.7–10.2)
Chloride: 102 mmol/L (ref 96–106)
Creatinine, Ser: 0.81 mg/dL (ref 0.57–1.00)
Globulin, Total: 2.7 g/dL (ref 1.5–4.5)
Glucose: 90 mg/dL (ref 70–99)
Potassium: 4.6 mmol/L (ref 3.5–5.2)
Sodium: 137 mmol/L (ref 134–144)
Total Protein: 6.6 g/dL (ref 6.0–8.5)
eGFR: 91 mL/min/{1.73_m2} (ref >59)

## 2023-05-08 LAB — VITAMIN D 25 HYDROXY (VIT D DEFICIENCY, FRACTURES): Vit D, 25-Hydroxy: 31.6 ng/mL (ref 30.0–100.0)

## 2023-05-08 LAB — INSULIN, RANDOM: INSULIN: 9.6 u[IU]/mL (ref 2.6–24.9)

## 2023-05-08 LAB — FOLATE: Folate: 7.6 ng/mL (ref >3.0)

## 2023-05-08 LAB — HEMOGLOBIN A1C
Est. average glucose Bld gHb Est-mCnc: 117 mg/dL
Hgb A1c MFr Bld: 5.7 % — ABNORMAL HIGH (ref 4.8–5.6)

## 2023-05-08 LAB — VITAMIN B12: Vitamin B-12: 776 pg/mL (ref 232–1245)

## 2023-05-08 NOTE — Telephone Encounter (Signed)
Received fax from [employer] that Katie Knight continues to be denied because it is not covered by plan benefit. It is excluded from specialty pharmacy benefits plan. Due to the decision being based on your patient's plan's formulary design and not a determination of medical necessity, an appeal of this decision is not available  Katie Knight, PharmD, MPH, BCPS, CPP Clinical Pharmacist (Rheumatology and Pulmonology)

## 2023-05-09 ENCOUNTER — Ambulatory Visit: Payer: BC Managed Care – PPO | Attending: Rheumatology | Admitting: Rheumatology

## 2023-05-09 VITALS — BP 165/101 | HR 91

## 2023-05-09 DIAGNOSIS — M170 Bilateral primary osteoarthritis of knee: Secondary | ICD-10-CM

## 2023-05-09 MED ORDER — LIDOCAINE HCL 1 % IJ SOLN
1.5000 mL | INTRAMUSCULAR | Status: AC | PRN
  Administered 2023-05-09: 1.5 mL

## 2023-05-09 MED ORDER — TRIAMCINOLONE ACETONIDE 40 MG/ML IJ SUSP
40.0000 mg | INTRAMUSCULAR | Status: AC | PRN
  Administered 2023-05-09: 40 mg via INTRA_ARTICULAR

## 2023-05-09 NOTE — Progress Notes (Signed)
Procedure Note  Patient: Katie Knight             Date of Birth: 1975/11/06           MRN: [id number]             Visit Date: 05/09/2023  Procedures: Visit Diagnoses:  1. Primary osteoarthritis of both knees    Patient had some relief from right knee joint cortisone injection.  She returns today to get a left knee joint cortisone injection. Patient also started going to the weight management clinic.  Large Joint Inj: L knee on 05/09/2023 3:41 PM Indications: pain Details: 27 G 1.5 in needle, medial approach  Arthrogram: No  Medications: 40 mg triamcinolone acetonide 40 MG/ML; 1.5 mL lidocaine 1 % Aspirate: 0 mL Outcome: tolerated well, no immediate complications Procedure, treatment alternatives, risks and benefits explained, specific risks discussed. Consent was given by the patient. Immediately prior to procedure a time out was called to verify the correct patient, procedure, equipment, support staff and site/side marked as required. Patient was prepped and draped in the usual sterile fashion.     Patient's blood pressure was elevated today.  Patient states she has elevated blood pressure due to increased pain.  She was advised to monitor blood pressure closely and follow-up with her PCP.  Pollyann Savoy, MD

## 2023-05-16 DIAGNOSIS — F419 Anxiety disorder, unspecified: Secondary | ICD-10-CM | POA: Diagnosis not present

## 2023-05-17 ENCOUNTER — Ambulatory Visit
Admission: RE | Admit: 2023-05-17 | Discharge: 2023-05-17 | Disposition: A | Discharge status: Home / Self Care | Payer: BC Managed Care – PPO | Source: Ambulatory Visit | Attending: Pulmonary Disease | Admitting: Pulmonary Disease

## 2023-05-17 DIAGNOSIS — R9389 Abnormal findings on diagnostic imaging of other specified body structures: Secondary | ICD-10-CM

## 2023-05-17 DIAGNOSIS — R918 Other nonspecific abnormal finding of lung field: Secondary | ICD-10-CM | POA: Diagnosis not present

## 2023-05-17 DIAGNOSIS — J479 Bronchiectasis, uncomplicated: Secondary | ICD-10-CM | POA: Diagnosis not present

## 2023-05-21 ENCOUNTER — Other Ambulatory Visit: Payer: Self-pay | Admitting: Family Medicine

## 2023-05-21 ENCOUNTER — Ambulatory Visit: Payer: BC Managed Care – PPO | Admitting: Family Medicine

## 2023-05-21 ENCOUNTER — Encounter: Payer: Self-pay | Admitting: Family Medicine

## 2023-05-21 VITALS — BP 146/84 | HR 95 | Temp 98.1°F | Ht 65.0 in | Wt 257.0 lb

## 2023-05-21 DIAGNOSIS — R7303 Prediabetes: Secondary | ICD-10-CM | POA: Insufficient documentation

## 2023-05-21 DIAGNOSIS — R030 Elevated blood-pressure reading, without diagnosis of hypertension: Secondary | ICD-10-CM

## 2023-05-21 DIAGNOSIS — E66813 Obesity, class 3: Secondary | ICD-10-CM | POA: Diagnosis not present

## 2023-05-21 DIAGNOSIS — Z6841 Body Mass Index (BMI) 40.0 and over, adult: Secondary | ICD-10-CM

## 2023-05-21 DIAGNOSIS — E559 Vitamin D deficiency, unspecified: Secondary | ICD-10-CM

## 2023-05-21 MED ORDER — TIRZEPATIDE-WEIGHT MANAGEMENT 2.5 MG/0.5ML ~~LOC~~ SOLN: 2.5000 mg | SUBCUTANEOUS | 0 refills | Status: DC

## 2023-05-21 NOTE — Assessment & Plan Note (Signed)
Lab Results  Component Value Date   HGBA1C 5.7 (H) 05/07/2023   Reviewed lab results with patient.  She denies a + fam hx of T2DM or a personal hx of GDM. She has never used metformin and denies high intake of sweets She has cut back on ginger ale intake  Continue to work on weight reduction with prescribed diet, tracking steps Consider adding in weight training 2 x a week to aid in insulin sensitivity and consider adding metformin if unable to get Zepbound started.

## 2023-05-21 NOTE — Assessment & Plan Note (Signed)
BP elevated today and it has run >130/80 in the past.  Denies HA or CP.  She does not have a home BP cuff.  Continue active plan for weight reduction Limit high sodium foods Consider treating if not improving

## 2023-05-21 NOTE — Progress Notes (Signed)
Office: [phone]  /  Fax: [phone]  WEIGHT SUMMARY AND BIOMETRICS  Starting Date: 05/07/23  Starting Weight: 262lb   Weight Lost Since Last Visit: 5lb   Vitals Temp: 98.1 F (36.7 C) BP: (!) 146/84 Pulse Rate: 95 SpO2: 95 %   Body Composition  Body Fat %: 50.8 % Fat Mass (lbs): 130.8 lbs Muscle Mass (lbs): 120.2 lbs Total Body Water (lbs): 95.8 lbs Visceral Fat Rating : 15   HPI  Chief Complaint: OBESITY  Katie Knight is here to discuss her progress with her obesity treatment plan. She is on the the Category 3 Plan and states she is following her eating plan approximately 90 % of the time. She states she is exercising 30 minutes 2 times per week.  Interval History:  Since last office visit she is down 5 lb She has struggled to get veggies into her diet but she is having more fruit, eggs, water, jerky and greek yogurt She is down 0.8 lb of muscle mass and is down 4.4 lb of body fat in the past 2 weeks She is bringing healthy foods to work and has cut out soda She is walking a lot at work and has started to track her steps averaging 10,000 steps/ day She is off of phentermine but doesn't see much change in appetite  Pharmacotherapy: none  PHYSICAL EXAM:  Blood pressure (!) 146/84, pulse 95, temperature 98.1 F (36.7 C), height 5\' 5"  (1.651 m), weight 257 lb (116.6 kg), SpO2 95%. Body mass index is 42.77 kg/m.  General: She is overweight, cooperative, alert, well developed, and in no acute distress. PSYCH: Has normal mood, affect and thought process.   Lungs: Normal breathing effort, no conversational dyspnea.   ASSESSMENT AND PLAN  TREATMENT PLAN FOR OBESITY:  Recommended Dietary Goals  Katie Knight is currently in the action stage of change. As such, her goal is to continue weight management plan. She has agreed to the Category 3 Plan.  Behavioral Intervention  We discussed the following Behavioral Modification Strategies today: increasing lean  protein intake to established goals, increasing vegetables, increasing fiber rich foods, avoiding skipping meals, increasing water intake , work on meal planning and preparation, keeping healthy foods at home, identifying sources and decreasing liquid calories, continue to practice mindfulness when eating, planning for success, and continue to work on maintaining a reduced calorie state, getting the recommended amount of protein, incorporating whole foods, making healthy choices, staying well hydrated and practicing mindfulness when eating..  Additional resources provided today: NA  Recommended Physical Activity Goals  Ardell has been advised to work up to 150 minutes of moderate intensity aerobic activity a week and strengthening exercises 2-3 times per week for cardiovascular health, weight loss maintenance and preservation of muscle mass.   She has agreed to Think about enjoyable ways to increase daily physical activity and overcoming barriers to exercise and Increase physical activity in their day and reduce sedentary time (increase NEAT). - track daily steps  Pharmacotherapy changes for the treatment of obesity: begin Zepbound 2.5 mg weekly Patient denies a personal or family history of pancreatitis, medullary thyroid carcinoma or multiple endocrine neoplasia type II. Recommend reviewing pen training video online. Reviewed [email or web address] website  ASSOCIATED CONDITIONS ADDRESSED TODAY  Vitamin D deficiency Assessment & Plan: Last vitamin D Lab Results  Component Value Date   VD25OH 31.6 05/07/2023   She is taking RX vitamin D 50,000 international units  weekly with her PCP We discussed the importance of vitamin D for energy  levels, immune function and bone health.  Plan to recheck level in 4mos with a goal > 50    Class 3 severe obesity due to excess calories with serious comorbidity and body mass index (BMI) of 40.0 to 44.9 in adult [facility]) -     Tirzepatide-Weight Management;  Inject 2.5 mg into the skin once a week.  Dispense: 2 mL; Refill: 0  Elevated blood pressure reading Assessment & Plan: BP elevated today and it has run >130/80 in the past.  Denies HA or CP.  She does not have a home BP cuff.  Continue active plan for weight reduction Limit high sodium foods Consider treating if not improving   Prediabetes Assessment & Plan: Lab Results  Component Value Date   HGBA1C 5.7 (H) 05/07/2023   Reviewed lab results with patient.  She denies a + fam hx of T2DM or a personal hx of GDM. She has never used metformin and denies high intake of sweets She has cut back on ginger ale intake  Continue to work on weight reduction with prescribed diet, tracking steps Consider adding in weight training 2 x a week to aid in insulin sensitivity and consider adding metformin if unable to get Zepbound started.       She was informed of the importance of frequent follow up visits to maximize her success with intensive lifestyle modifications for her multiple health conditions.   ATTESTASTION STATEMENTS:  Reviewed by clinician on day of visit: allergies, medications, problem list, medical history, surgical history, family history, social history, and previous encounter notes pertinent to obesity diagnosis.   I have personally spent 30 minutes total time today in preparation, patient care, nutritional counseling and documentation for this visit, including the following: review of clinical lab tests; review of medical tests/procedures/services.      Glennis Brink, DO DABFM, DABOM [facility] [address] [place], [place] [address] [phone]

## 2023-05-21 NOTE — Assessment & Plan Note (Signed)
Last vitamin D Lab Results  Component Value Date   VD25OH 31.6 05/07/2023   She is taking RX vitamin D 50,000 international units  weekly with her PCP We discussed the importance of vitamin D for energy levels, immune function and bone health.  Plan to recheck level in 4mos with a goal > 50

## 2023-05-22 ENCOUNTER — Telehealth: Payer: Self-pay | Admitting: *Deleted

## 2023-05-22 NOTE — Telephone Encounter (Signed)
Prior authorization done via cover my meds for patients Zepbound. Waiting on determination.   

## 2023-05-23 NOTE — Telephone Encounter (Signed)
Denied on November 6 by [employer] 2017 NCPDP Request Reference Number: [id number]. ZEPBOUND INJ 2.5MG  is denied due to Plan Exclusion. For further questions, call [phone].

## 2023-05-27 ENCOUNTER — Telehealth: Payer: Self-pay | Admitting: Pulmonary Disease

## 2023-05-27 DIAGNOSIS — J4540 Moderate persistent asthma, uncomplicated: Secondary | ICD-10-CM

## 2023-05-27 NOTE — Telephone Encounter (Signed)
Amber checking on refill for [employer]. Amber phone number is [phone].

## 2023-05-29 DIAGNOSIS — F419 Anxiety disorder, unspecified: Secondary | ICD-10-CM | POA: Diagnosis not present

## 2023-05-29 MED ORDER — TEZSPIRE 210 MG/1.91ML ~~LOC~~ SOAJ: 210.0000 mg | SUBCUTANEOUS | 5 refills | Status: DC

## 2023-05-29 NOTE — Telephone Encounter (Signed)
Refill for Tezspire sent to [facility]  Chesley Mires, PharmD, MPH, BCPS, CPP Clinical Pharmacist (Rheumatology and Pulmonology)

## 2023-06-04 ENCOUNTER — Encounter: Payer: Self-pay | Admitting: Pulmonary Disease

## 2023-06-04 DIAGNOSIS — J849 Interstitial pulmonary disease, unspecified: Secondary | ICD-10-CM

## 2023-06-04 NOTE — Telephone Encounter (Signed)
PT calling again on her CT results. Pls call @ [phone]

## 2023-06-07 DIAGNOSIS — F419 Anxiety disorder, unspecified: Secondary | ICD-10-CM | POA: Diagnosis not present

## 2023-06-11 ENCOUNTER — Other Ambulatory Visit: Payer: Self-pay | Admitting: Rheumatology

## 2023-06-11 DIAGNOSIS — E559 Vitamin D deficiency, unspecified: Secondary | ICD-10-CM

## 2023-06-11 MED ORDER — HYDROCODONE BIT-HOMATROP MBR 5-1.5 MG/5ML PO SOLN: 5.0000 mL | Freq: Every evening | ORAL | 0 refills | Status: DC | PRN

## 2023-06-11 NOTE — Telephone Encounter (Signed)
Discussed CT findings. Need biopsy. Given negative bronchoscopic biopsy in past, recommend surgical lung biopsy. She agrees. Referral to thoracic surgery sent. Cough is worse. Cough syrup prescribed.

## 2023-06-11 NOTE — Telephone Encounter (Signed)
Pt calling back

## 2023-06-17 ENCOUNTER — Ambulatory Visit: Payer: BC Managed Care – PPO | Admitting: Family Medicine

## 2023-06-18 DIAGNOSIS — F419 Anxiety disorder, unspecified: Secondary | ICD-10-CM | POA: Diagnosis not present

## 2023-06-21 NOTE — Progress Notes (Signed)
Office Visit Note  Patient: Katie Knight             Date of Birth: 1976-01-12           MRN: [id number]             PCP: [name], MD Referring: [name], MD Visit Date: 07/05/2023 Occupation: @GUAROCC @  Subjective:  Chronic cough   History of Present Illness: Katie Knight is a 47 y.o. female with history of osteoarthritis and positive ANA.  Patient remains under the care of Dr. Judeth Horn.  She continues to have a chronic cough which has worsened over the past 1 to 2 weeks.  She denies any congestion or postnasal drip at this time.  She has not had any recent fevers.  Patient had a high-resolution chest CT updated on 05/17/2023 and is scheduled for thoracoscopy on 07/26/23 for a biopsy.  Patient continues to have chronic pain involving both knee joints.  She has noticed intermittent warmth and swelling in her knees.  She has also had nocturnal pain.  She had a right knee joint cortisone injection on 04/03/2023 and a left knee joint cortisone injection on 05/09/2023.  Patient states she only had relief for 1 to 2 days and her symptoms returned.  She has been taking BC arthritis daily for pain relief.   She has to stand for 12 hours a day on concrete for work which exacerbates her knee joint pain.  Patient would like to apply for visco-supplementation of both knees.    She denies any eye pain, photophobia, or eye redness.  She has not been diagnosed with iritis or uveitis in the past.  Patient had an ophthalmology visit last week.   Activities of Daily Living:  Patient reports morning stiffness for 5 minutes.   Patient Reports nocturnal pain.  Difficulty dressing/grooming: Denies Difficulty climbing stairs: Denies Difficulty getting out of chair: Denies Difficulty using hands for taps, buttons, cutlery, and/or writing: Denies  Review of Systems  Constitutional:  Positive for fatigue.  HENT:  Negative for mouth sores and mouth dryness.   Eyes:  Negative for dryness.   Respiratory:  Positive for shortness of breath.   Cardiovascular:  Negative for chest pain and palpitations.  Gastrointestinal:  Negative for blood in stool, constipation and diarrhea.  Endocrine: Negative for increased urination.  Genitourinary:  Negative for involuntary urination.  Musculoskeletal:  Positive for joint pain, joint pain, joint swelling, myalgias, morning stiffness and myalgias. Negative for gait problem, muscle weakness and muscle tenderness.  Skin:  Negative for color change, rash, hair loss and sensitivity to sunlight.  Allergic/Immunologic: Negative for susceptible to infections.  Neurological:  Negative for dizziness and headaches.  Hematological:  Negative for swollen glands.  Psychiatric/Behavioral:  Positive for sleep disturbance. Negative for depressed mood. The patient is not nervous/anxious.     PMFS History:  Patient Active Problem List   Diagnosis Date Noted   Prediabetes 05/21/2023   Other fatigue 05/07/2023   SOBOE (shortness of breath on exertion) 05/07/2023   Vitamin D deficiency 05/07/2023   Snoring 05/07/2023   Depression screen 05/07/2023   BMI 40.0-44.9, adult (HCC) 05/07/2023   Morbid obesity with starting BMI 43.7 05/07/2023   Elevated blood pressure reading 04/23/2023   Chronic pain of both knees 04/23/2023   Asthma 08/27/2022   Chronic cough 08/27/2022   Abnormal CT of the chest 08/27/2022    Past Medical History:  Diagnosis Date   Asthma    High blood pressure  Hyperlipidemia    Joint pain    Osteoporosis    SOB (shortness of breath)    Vitamin D deficiency     Family History  Problem Relation Age of Onset   Hypertension Mother    Hypertension Father    Colon cancer Neg Hx    Esophageal cancer Neg Hx    Rectal cancer Neg Hx    Stomach cancer Neg Hx    Past Surgical History:  Procedure Laterality Date   BRONCHIAL BIOPSY  04/30/2022   Procedure: BRONCHIAL BIOPSIES;  Surgeon: Karren Burly, MD;  Location: [facility]  ENDOSCOPY;  Service: Endoscopy;;   BRONCHIAL NEEDLE ASPIRATION BIOPSY  04/30/2022   Procedure: BRONCHIAL NEEDLE ASPIRATION BIOPSIES;  Surgeon: Karren Burly, MD;  Location: [facility] ENDOSCOPY;  Service: Endoscopy;;   BRONCHIAL WASHINGS  04/30/2022   Procedure: BRONCHIAL WASHINGS;  Surgeon: Karren Burly, MD;  Location: [facility];  Service: Endoscopy;;   DILATION AND CURETTAGE OF UTERUS     ENDOBRONCHIAL ULTRASOUND N/A 04/30/2022   Procedure: ENDOBRONCHIAL ULTRASOUND;  Surgeon: Karren Burly, MD;  Location: [facility] ENDOSCOPY;  Service: Endoscopy;  Laterality: N/A;   VIDEO BRONCHOSCOPY  04/30/2022   Procedure: VIDEO BRONCHOSCOPY WITHOUT FLUORO;  Surgeon: Karren Burly, MD;  Location: [facility];  Service: Endoscopy;;   Social History   Social History Narrative   Not on file    There is no immunization history on file for this patient.   Objective: Vital Signs: BP 136/88 (BP Location: Left Arm, Patient Position: Sitting, Cuff Size: Large)   Pulse 84   Resp 14   Ht 5\' 5"  (1.651 m)   Wt 268 lb (121.6 kg)   LMP 06/10/2023   BMI 44.60 kg/m    Physical Exam Vitals and nursing note reviewed.  Constitutional:      Appearance: She is well-developed.  HENT:     Head: Normocephalic and atraumatic.  Eyes:     Conjunctiva/sclera: Conjunctivae normal.  Cardiovascular:     Rate and Rhythm: Normal rate and regular rhythm.     Heart sounds: Normal heart sounds.  Pulmonary:     Effort: Pulmonary effort is normal.     Breath sounds: Normal breath sounds.  Abdominal:     General: Bowel sounds are normal.     Palpations: Abdomen is soft.  Musculoskeletal:     Cervical back: Normal range of motion.  Lymphadenopathy:     Cervical: No cervical adenopathy.  Skin:    General: Skin is warm and dry.     Capillary Refill: Capillary refill takes less than 2 seconds.  Neurological:     Mental Status: She is alert and oriented to person, place, and time.  Psychiatric:         Behavior: Behavior normal.      Musculoskeletal Exam: C-spine, thoracic spine, and lumbar spine good range of motion.  Shoulder joints, elbow joints, wrist joints, MCPs, PIPs, DIPs have good range of motion with no synovitis.  Complete fist formation bilaterally.  Hip joints have good range of motion with no groin pain.  Painful range of motion of both knees with crepitus bilaterally.  No knee joint effusion noted.  Ankle joints have good range of motion with no tenderness or joint swelling.  CDAI Exam: CDAI Score: -- Patient Global: --; Provider Global: -- Swollen: --; Tender: -- Joint Exam 07/05/2023   No joint exam has been documented for this visit   There is currently no information documented on the homunculus. Go  to the Rheumatology activity and complete the homunculus joint exam.  Investigation: No additional findings.  Imaging: No results found.  Recent Labs: Lab Results  Component Value Date   WBC 4.6 02/20/2023   HGB 12.4 02/20/2023   PLT 429 (H) 02/20/2023   NA 137 05/07/2023   K 4.6 05/07/2023   CL 102 05/07/2023   CO2 23 05/07/2023   GLUCOSE 90 05/07/2023   BUN 15 05/07/2023   CREATININE 0.81 05/07/2023   BILITOT 0.3 05/07/2023   ALKPHOS 70 05/07/2023   AST 16 05/07/2023   ALT 11 05/07/2023   PROT 6.6 05/07/2023   ALBUMIN 3.9 05/07/2023   CALCIUM 8.9 05/07/2023   GFRAA >60 02/06/2018    Speciality Comments: No specialty comments available.  Procedures:  No procedures performed Allergies: Patient has no known allergies.       Assessment / Plan:     Visit Diagnoses: Primary osteoarthritis of both knees: X-rays of both knees were updated on 02/20/2023: Right knee was consistent with moderate to severe medial compartment narrowing and severe patellofemoral narrowing.  The left knee was consistent with moderate to severe medial compartment narrowing and moderate patellofemoral narrowing. The patient underwent a right knee joint cortisone injection on  04/03/2023 and a left knee joint cortisone injection on 05/09/2023.  According to the patient the injections only provided relief for 1 to 2 days.  She has had ongoing severe pain and stiffness involving both knees.  She has to stand on concrete for 12 hours a day working at [employer] which exacerbates her symptoms.  She has been taking BC arthritis powder for pain relief. On examination she has painful range of motion of both knee joints.  No effusion noted.  Bilateral knee crepitus was noted with range of motion. Different treatment options were discussed.  Plan to apply for viscosupplementation for both knees.  Positive ANA (antinuclear antibody) - 8/7/24ANA 1: 320 NH, RNP negative, Smith negative, dsDNA negative, SCL 70 negative, C3-C4 normal, anticardiolipin negative, beta-2 GP 1 negative, sed rate 65. ACE low-6 on 02/20/23.   Other fatigue: Chronic, stable.  She experiences nocturnal pain involving both knees which contributes to her daytime fatigue.  She has also been working 12-hour days at [employer].   Hilar lymphadenopathy - Noted on the CT scan of the chest.   Initial bronchoscopy biopsy was negative for sarcoidosis.  Patient is followed by Dr. Judeth Horn. She will be undergoing thoracoscopy on 07/26/23 for surgical biopsy.   Chronic cough: Reviewed Dr. Laurena Spies office visit note from 05/06/23:  Scattered endobronchial nodules, groundglass: Possible sarcoidosis with some lymphadenopathy.  I also worry about HSP given work exposure worsening with exposure at work inhalations etc.  Cough has not responded well to inhaled therapies and intranasal therapies.  Size of lymph nodes had shrunk at time of EBUS favoring more reactive lymphadenopathy.  No evidence of sarcoidosis on bronchoscopy studies.  Given report of worsening symptoms with inhalational or work exposure, hypersensitivity pneumonitis is quite possible.   High resolution chest CT updated on 05/17/23: Diffuse interstitial and  peribronchovascular ground-glass with slight nodularity along the fissures and mild bronchiectasis, similar to minimally progressive from 04/11/2022. 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 Scheduled for thoracoscopy for a surgical lung biospy on 07/26/23 with Dr. Dorris Fetch.  The next steps treatment will be determined pending biopsy results.  Severe persistent asthma with acute exacerbation - Followed by Dr. Judeth Horn: Patient remains on Symbicort, Flonase, Afrin as needed, ProAir as needed, and Tezspire every 28 days.   Abnormal  CT of the chest: High-resolution chest CT updated on 05/17/2023:Diffuse interstitial and peribronchovascular ground-glass with slight nodularity along the fissures and mild bronchiectasis, similar to minimally progressive from 04/11/2022. 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 Undergoing a thoracoscopy on 07/26/23.   High risk medication use: Future orders for baseline immunosuppressive labs were placed today to be drawn in case she needs to be started on any immunosuppressive agents in the future.  Vitamin D deficiency: She is taking vitamin D 50,000 units once weekly.  Irritable bowel syndrome with constipation  Orders: No orders of the defined types were placed in this encounter.  No orders of the defined types were placed in this encounter.    Follow-Up Instructions: Return in about 6 weeks (around 08/16/2023) for Osteoarthritis.   Gearldine Bienenstock, PA-C  Note - This record has been created using Dragon software.  Chart creation errors have been sought, but may not always  have been located. Such creation errors do not reflect on  the standard of medical care.

## 2023-06-25 ENCOUNTER — Telehealth: Payer: Self-pay | Admitting: Pulmonary Disease

## 2023-06-25 NOTE — Telephone Encounter (Signed)
Katie Knight [employer] called wondering if this [employer] was active in order to get them on [employer]. I ran a live check and it came back as active. They wanted me to fax a screen print of this to them because [employer] would not verify if the ins was active for her. I asked Fredric Mare and she said it sounded like an odd request and suspicious. Katie Knight was her name. Fax is [phone]. How does this sound to you?

## 2023-06-26 ENCOUNTER — Ambulatory Visit: Payer: BC Managed Care – PPO | Admitting: Family Medicine

## 2023-06-28 NOTE — Telephone Encounter (Signed)
Pt receiving Tezspire through bridge program. Faxed insurance information to program for ongoing eligibility  Fax: [phone]

## 2023-07-02 ENCOUNTER — Ambulatory Visit (INDEPENDENT_AMBULATORY_CARE_PROVIDER_SITE_OTHER): Payer: BC Managed Care – PPO | Admitting: Family Medicine

## 2023-07-02 ENCOUNTER — Encounter: Payer: Self-pay | Admitting: Family Medicine

## 2023-07-02 VITALS — BP 138/88 | HR 70 | Temp 98.1°F | Ht 65.0 in | Wt 263.0 lb

## 2023-07-02 DIAGNOSIS — M25562 Pain in left knee: Secondary | ICD-10-CM

## 2023-07-02 DIAGNOSIS — M25561 Pain in right knee: Secondary | ICD-10-CM

## 2023-07-02 DIAGNOSIS — E66813 Obesity, class 3: Secondary | ICD-10-CM

## 2023-07-02 DIAGNOSIS — E559 Vitamin D deficiency, unspecified: Secondary | ICD-10-CM

## 2023-07-02 DIAGNOSIS — Z6841 Body Mass Index (BMI) 40.0 and over, adult: Secondary | ICD-10-CM

## 2023-07-02 DIAGNOSIS — R7303 Prediabetes: Secondary | ICD-10-CM

## 2023-07-02 DIAGNOSIS — J4550 Severe persistent asthma, uncomplicated: Secondary | ICD-10-CM | POA: Diagnosis not present

## 2023-07-02 DIAGNOSIS — G8929 Other chronic pain: Secondary | ICD-10-CM

## 2023-07-02 MED ORDER — VITAMIN D (ERGOCALCIFEROL) 1.25 MG (50000 UNIT) PO CAPS: 50000.0000 [IU] | ORAL_CAPSULE | ORAL | 0 refills | Status: DC

## 2023-07-02 NOTE — Assessment & Plan Note (Signed)
She is planning to have a lung biopsy soon for possible sarcoid diagnosis, managed by pulmonology and still having problems with cough and DOE affecting ability to ramp up exercise  Keep upcoming f/u with cardiothoracic surgery and pulmonology If she does move forward with WLS, she would need to get thru this eval first and get pulmonology clearance

## 2023-07-02 NOTE — Assessment & Plan Note (Signed)
Last vitamin D Lab Results  Component Value Date   VD25OH 31.6 05/07/2023   She is taking RX vitamin D once weekly Energy level has been lower lately  Repeat lab in FEB

## 2023-07-02 NOTE — Assessment & Plan Note (Signed)
Lab Results  Component Value Date   HGBA1C 5.7 (H) 05/07/2023   She has been working on reducing intake of starches and sweets Exercise has been limited  Repeat lab in [place]

## 2023-07-02 NOTE — Patient Instructions (Addendum)
Try to log intake on the MyFitnessPal ap Aim for 1600 cal / day This should include 120 g of protein intake daily  Attend online surgical seminar for [facility] for weight loss surgery info Referral will be put in for Dr Laury Deep of luck with upcoming lung biopsy!  Stay on RX vitamin D weekly

## 2023-07-02 NOTE — Assessment & Plan Note (Signed)
Bilateral DJD knee pain limits ability to exercise She has failed to lose weight with calorie restricted diet  We discussed the role of WLS to reduce the mechanical burden of obesity

## 2023-07-02 NOTE — Progress Notes (Signed)
Office: [phone]  /  Fax: [phone]  WEIGHT SUMMARY AND BIOMETRICS  Starting Date: 05/07/23  Starting Weight: 262lb   Weight Lost Since Last Visit: 0lb   Vitals Temp: 98.1 F (36.7 C) BP: 138/88 Pulse Rate: 70 SpO2: 97 %   Body Composition  Body Fat %: 52.2 % Fat Mass (lbs): 137.2 lbs Muscle Mass (lbs): 119.4 lbs Total Body Water (lbs): 97.4 lbs Visceral Fat Rating : 16    HPI  Chief Complaint: OBESITY  Katie Knight is here to discuss her progress with her obesity treatment plan. She is on the the Category 3 Plan and states she is following her eating plan approximately 25 % of the time. She states she is walking a few times a week.    Interval History:  Since last office visit she is up 6 lb She had a denial of Zepbound RX She has struggled more with meal prep for work Denies overeating at meals, over snacking or sugar cravings She has a net weight gain of 1 lb in 8 weeks of medically supervised weight management She has not been working out as much She did get sick and needed prednisone for an asthma flare up She is preparing for a lung biopsy Exercise is limited due to SOB and cough She has had bilateral knee pain due to DJD which has also limited her ability to exercise She has considered weight loss surgery She was on phentermine on and off for years with little results She is down from her max weight of 325 lb  Pharmacotherapy: none  PHYSICAL EXAM:  Blood pressure 138/88, pulse 70, temperature 98.1 F (36.7 C), height 5\' 5"  (1.651 m), weight 263 lb (119.3 kg), SpO2 97%. Body mass index is 43.77 kg/m.  General: She is overweight, cooperative, alert, well developed, and in no acute distress. PSYCH: Has normal mood, affect and thought process.   Lungs: Normal breathing effort, no conversational dyspnea.   ASSESSMENT AND PLAN  TREATMENT PLAN FOR OBESITY:  Recommended Dietary Goals  Katie Knight is currently in the action stage of change. As  such, her goal is to continue weight management plan. She has agreed to keeping a food journal and adhering to recommended goals of 1600 calories and 120 g of  protein.  Behavioral Intervention  We discussed the following Behavioral Modification Strategies today: increasing lean protein intake to established goals, increasing vegetables, increasing lower glycemic fruits, increasing water intake , work on meal planning and preparation, work on Counselling psychologist calories using tracking application, keeping healthy foods at home, work on managing stress, creating time for self-care and relaxation, avoiding temptations and identifying enticing environmental cues, continue to work on implementation of reduced calorie nutritional plan, continue to practice mindfulness when eating, and continue to work on maintaining a reduced calorie state, getting the recommended amount of protein, incorporating whole foods, making healthy choices, staying well hydrated and practicing mindfulness when eating..  Additional resources provided today: NA  Recommended Physical Activity Goals  Katie Knight has been advised to work up to 150 minutes of moderate intensity aerobic activity a week and strengthening exercises 2-3 times per week for cardiovascular health, weight loss maintenance and preservation of muscle mass.   She has agreed to Think about enjoyable ways to increase daily physical activity and overcoming barriers to exercise and Increase physical activity in their day and reduce sedentary time (increase NEAT).  Pharmacotherapy changes for the treatment of obesity: none  ASSOCIATED CONDITIONS ADDRESSED TODAY  Vitamin D deficiency Assessment &  Plan: Last vitamin D Lab Results  Component Value Date   VD25OH 31.6 05/07/2023   She is taking RX vitamin D once weekly Energy level has been lower lately  Repeat lab in FEB  Orders: -     Vitamin D (Ergocalciferol); Take 1 capsule (50,000 Units total) by  mouth every 7 (seven) days.  Dispense: 12 capsule; Refill: 0  Class 3 severe obesity due to excess calories with body mass index (BMI) of 40.0 to 44.9 in adult, unspecified whether serious comorbidity present (HCC) -     Amb Referral to Bariatric Surgery  Prediabetes Assessment & Plan: Lab Results  Component Value Date   HGBA1C 5.7 (H) 05/07/2023   She has been working on reducing intake of starches and sweets Exercise has been limited  Repeat lab in FEB   Severe persistent asthma, unspecified whether complicated Assessment & Plan: She is planning to have a lung biopsy soon for possible sarcoid diagnosis, managed by pulmonology and still having problems with cough and DOE affecting ability to ramp up exercise  Keep upcoming f/u with cardiothoracic surgery and pulmonology If she does move forward with WLS, she would need to get thru this eval first and get pulmonology clearance   Chronic pain of both knees Assessment & Plan: Bilateral DJD knee pain limits ability to exercise She has failed to lose weight with calorie restricted diet  We discussed the role of WLS to reduce the mechanical burden of obesity        She was informed of the importance of frequent follow up visits to maximize her success with intensive lifestyle modifications for her multiple health conditions.   ATTESTASTION STATEMENTS:  Reviewed by clinician on day of visit: allergies, medications, problem list, medical history, surgical history, family history, social history, and previous encounter notes pertinent to obesity diagnosis.   I have personally spent 30 minutes total time today in preparation, patient care, nutritional counseling and documentation for this visit, including the following: review of clinical lab tests; review of medical tests/procedures/services.      Glennis Brink, DO DABFM, DABOM [facility] [address] [place], [place] [address] [phone]

## 2023-07-03 ENCOUNTER — Encounter: Payer: Self-pay | Admitting: *Deleted

## 2023-07-03 ENCOUNTER — Encounter: Payer: Self-pay | Admitting: Thoracic Surgery (Cardiothoracic Vascular Surgery)

## 2023-07-03 ENCOUNTER — Institutional Professional Consult (permissible substitution): Payer: BC Managed Care – PPO | Admitting: Thoracic Surgery (Cardiothoracic Vascular Surgery)

## 2023-07-03 ENCOUNTER — Other Ambulatory Visit: Payer: Self-pay | Admitting: *Deleted

## 2023-07-03 VITALS — BP 155/100 | HR 79 | Resp 20 | Ht 65.0 in | Wt 263.0 lb

## 2023-07-03 DIAGNOSIS — J849 Interstitial pulmonary disease, unspecified: Secondary | ICD-10-CM

## 2023-07-03 NOTE — Progress Notes (Signed)
PCP is [name], MD Referring Provider is Hunsucker, Lesia Sago, MD  Chief Complaint  Patient presents with   Interstitial Lung Disease    Chest CT 05/17/23    HPI: Katie Knight is sent for consultation regarding biopsy for interstitial lung disease.  Katie Knight is a 47 year old woman with history of asthma, hypertension, hyperlipidemia, osteoporosis, chronic dyspnea, and interstitial lung disease.  She has a longstanding history of asthma.  Over a year ago she had a persistent severe cough.  It was refractory to medications.  She would often cough so hard that she would throw up.  Also has had worsening shortness of breath.  She can walk up a flight of stairs but would have to stop and catch her breath.  She works at the [employer].  She works 12-hour shifts and has to do a lot of physical things at work.  She had had a bronchoscopic biopsy but that was nondiagnostic.  Recent CT of the chest showed some mild progression of interstitial and peribronchovascular groundglass opacities.  Katie Knight Score: At the time of surgery this patient's most appropriate activity status/level should be described as: []     0    Normal activity, no symptoms [x]     1    Restricted in physical strenuous activity but ambulatory, able to do out light work []     2    Ambulatory and capable of self care, unable to do work activities, up and about >50 % of waking hours                              []     3    Only limited self care, in bed greater than 50% of waking hours []     4    Completely disabled, no self care, confined to bed or chair []     5    Moribund  Past Medical History:  Diagnosis Date   Asthma    High blood pressure    Hyperlipidemia    Joint pain    Osteoporosis    SOB (shortness of breath)    Vitamin D deficiency     Past Surgical History:  Procedure Laterality Date   BRONCHIAL BIOPSY  04/30/2022   Procedure: BRONCHIAL BIOPSIES;  Surgeon: Karren Burly, MD;   Location: [facility];  Service: Endoscopy;;   BRONCHIAL NEEDLE ASPIRATION BIOPSY  04/30/2022   Procedure: BRONCHIAL NEEDLE ASPIRATION BIOPSIES;  Surgeon: Karren Burly, MD;  Location: [facility];  Service: Endoscopy;;   BRONCHIAL WASHINGS  04/30/2022   Procedure: BRONCHIAL WASHINGS;  Surgeon: Karren Burly, MD;  Location: [facility];  Service: Endoscopy;;   DILATION AND CURETTAGE OF UTERUS     ENDOBRONCHIAL ULTRASOUND N/A 04/30/2022   Procedure: ENDOBRONCHIAL ULTRASOUND;  Surgeon: Karren Burly, MD;  Location: [facility];  Service: Endoscopy;  Laterality: N/A;   VIDEO BRONCHOSCOPY  04/30/2022   Procedure: VIDEO BRONCHOSCOPY WITHOUT FLUORO;  Surgeon: Karren Burly, MD;  Location: [facility];  Service: Endoscopy;;    Family History  Problem Relation Age of Onset   Hypertension Mother    Hypertension Father    Colon cancer Neg Hx    Esophageal cancer Neg Hx    Rectal cancer Neg Hx    Stomach cancer Neg Hx     Social History Social History   Tobacco Use   Smoking status: Never    Passive exposure: Past   Smokeless  tobacco: Never  Vaping Use   Vaping status: Never Used  Substance Use Topics   Alcohol use: Yes    Comment: occ   Drug use: Never    Current Outpatient Medications  Medication Sig Dispense Refill   budesonide-formoterol (SYMBICORT) 160-4.5 MCG/ACT inhaler Inhale 2 puffs into the lungs in the morning and at bedtime. 1 each 5   fluticasone (FLONASE) 50 MCG/ACT nasal spray Place 1 spray into both nostrils 2 (two) times daily. 16 g 2   LINZESS 145 MCG CAPS capsule Take 145 mcg by mouth daily.     Multiple Vitamins-Minerals (MULTIVITAMIN WITH MINERALS) tablet Take 1 tablet by mouth daily. gummy     oxymetazoline (AFRIN) 0.05 % nasal spray Place 1 spray into both nostrils daily as needed for congestion.     PROAIR HFA 108 (90 Base) MCG/ACT inhaler Inhale 2 puffs into the lungs every 4 (four) hours as needed for wheezing or shortness of  breath. 6.7 each 6   Tezepelumab-ekko (TEZSPIRE) 210 MG/1. SOAJ Inject 210 mg into the skin every 28 (twenty-eight) days. 1.91 mL 5   Vitamin D, Ergocalciferol, (DRISDOL) 1.25 MG (50000 UNIT) CAPS capsule Take 1 capsule (50,000 Units total) by mouth every 7 (seven) days. 12 capsule 0   HYDROcodone bit-homatropine (HYCODAN) 5-1.5 MG/5ML syrup Take 5 mLs by mouth at bedtime as needed for cough. (Patient not taking: Reported on 07/03/2023) 240 mL 0   No current facility-administered medications for this visit.    No Known Allergies  Review of Systems  Constitutional:  Positive for activity change and fatigue. Negative for unexpected weight change.  HENT:  Negative for trouble swallowing and voice change.   Eyes:  Negative for visual disturbance.  Respiratory:  Positive for cough, shortness of breath and wheezing.   Cardiovascular:  Negative for chest pain and leg swelling.  Gastrointestinal:  Positive for vomiting.  Musculoskeletal:  Negative for arthralgias and gait problem.  Neurological:  Positive for headaches. Negative for weakness.  Hematological:  Negative for adenopathy. Does not bruise/bleed easily.  All other systems reviewed and are negative.   BP (!) 155/100   Pulse 79   Resp 20   Ht 5\' 5"  (1.651 m)   Wt 263 lb (119.3 kg)   SpO2 97% Comment: RA  BMI 43.77 kg/m  Physical Exam Vitals reviewed.  Constitutional:      General: She is not in acute distress.    Appearance: She is obese.  HENT:     Head: Normocephalic and atraumatic.  Eyes:     General: No scleral icterus.    Extraocular Movements: Extraocular movements intact.  Cardiovascular:     Rate and Rhythm: Normal rate and regular rhythm.     Heart sounds: Normal heart sounds. No murmur heard.    No friction rub. No gallop.  Pulmonary:     Effort: No respiratory distress.     Breath sounds: Normal breath sounds. No wheezing or rales.  Abdominal:     General: There is no distension.     Palpations:  Abdomen is soft.  Skin:    General: Skin is warm and dry.  Neurological:     General: No focal deficit present.     Mental Status: She is alert and oriented to person, place, and time.     Cranial Nerves: No cranial nerve deficit.     Motor: No weakness.    Diagnostic Tests: CT CHEST WITHOUT CONTRAST   TECHNIQUE: Multidetector CT imaging of the chest was  performed following the standard protocol without intravenous contrast. High resolution imaging of the lungs, as well as inspiratory and expiratory imaging, was performed.   RADIATION DOSE REDUCTION: This exam was performed according to the departmental dose-optimization program which includes automated exposure control, adjustment of the mA and/or kV according to patient size and/or use of iterative reconstruction technique.   COMPARISON:  04/11/2022 and 03/03/2018.   FINDINGS: Cardiovascular: Heart is enlarged.  No pericardial effusion.   Mediastinum/Nodes: No pathologically enlarged mediastinal or axillary lymph nodes. Hilar regions are difficult to definitively evaluate without IV contrast. Esophagus is grossly unremarkable.   Lungs/Pleura: Diffuse interstitial and peribronchovascular ground-glass with slight nodularity along the fissures. Scattered mild bronchiectasis. Findings are similar to minimally progressive from 04/11/2022. 5 mm anterolateral left lower lobe nodule has a tag to the adjacent pleura (8/95), unchanged and indicative of a subpleural lymph node. No specific follow-up necessary. No pleural fluid. Airway is unremarkable. Mild air trapping.   Upper Abdomen: Situs inversus. Visualized portions of the liver, adrenal glands, kidneys, spleen, pancreas, stomach and bowel are otherwise grossly unremarkable. No upper abdominal adenopathy.   Musculoskeletal: Degenerative changes in the spine.   IMPRESSION: Diffuse interstitial and peribronchovascular ground-glass with slight nodularity along the fissures  and mild bronchiectasis, similar to minimally progressive from 04/11/2022. 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     Electronically Signed   By: Leanna Battles M.D.   On: 06/04/2023 14:32 I personally reviewed the CT images.  There are diffuse groundglass opacities bilaterally  Pulmonary function testing FVC 2.72 (69%) FEV1 2.17 (69%) No significant change with bronchodilator DLCO 17.48 (75%)  Impression: Katie Knight is a 47 year old woman with history of asthma, hypertension, hyperlipidemia, osteoporosis, chronic dyspnea, and interstitial lung disease.    Interstitial and peribronchial vascular groundglass opacities- Differential diagnosis includes sarcoidosis as well as other interstitial lung diseases.  Bronchoscopic biopsy nondiagnostic.   I offered her the option of robotic assisted right VATS for lung biopsy for diagnostic purposes.  She understands this is not a therapeutic procedure.  I informed her of the general nature of the procedure including the need for general anesthesia, the incisions to be used, the use of the surgical robot, the expected hospital stay, and the overall recovery.  I informed her of the indications, risks, benefits, and alternatives.  She understands the risks include, but not limited to death, MI, DVT, PE, bleeding, possible need for transfusion, infection, prolonged air leak, cardiac arrhythmias, pain issues as well as the possibility of other unstable complications.  She accepts the risks and wishes to proceed  Plan: Robotic assisted right VATS for lung biopsy on Friday, July 26, 2023  I spent over 45 minutes in review of records, images, and in consultation with Katie Knight today.  Loreli Slot, MD [facility] [phone]

## 2023-07-05 ENCOUNTER — Encounter: Payer: Self-pay | Admitting: Physician Assistant

## 2023-07-05 ENCOUNTER — Ambulatory Visit: Payer: BC Managed Care – PPO | Attending: Physician Assistant | Admitting: Physician Assistant

## 2023-07-05 ENCOUNTER — Telehealth: Payer: Self-pay

## 2023-07-05 VITALS — BP 136/88 | HR 84 | Resp 14 | Ht 65.0 in | Wt 268.0 lb

## 2023-07-05 DIAGNOSIS — M170 Bilateral primary osteoarthritis of knee: Secondary | ICD-10-CM | POA: Diagnosis not present

## 2023-07-05 DIAGNOSIS — R590 Localized enlarged lymph nodes: Secondary | ICD-10-CM

## 2023-07-05 DIAGNOSIS — R768 Other specified abnormal immunological findings in serum: Secondary | ICD-10-CM

## 2023-07-05 DIAGNOSIS — Z111 Encounter for screening for respiratory tuberculosis: Secondary | ICD-10-CM

## 2023-07-05 DIAGNOSIS — R053 Chronic cough: Secondary | ICD-10-CM

## 2023-07-05 DIAGNOSIS — Z79899 Other long term (current) drug therapy: Secondary | ICD-10-CM

## 2023-07-05 DIAGNOSIS — R9389 Abnormal findings on diagnostic imaging of other specified body structures: Secondary | ICD-10-CM

## 2023-07-05 DIAGNOSIS — R5383 Other fatigue: Secondary | ICD-10-CM

## 2023-07-05 DIAGNOSIS — M791 Myalgia, unspecified site: Secondary | ICD-10-CM

## 2023-07-05 DIAGNOSIS — J4551 Severe persistent asthma with (acute) exacerbation: Secondary | ICD-10-CM

## 2023-07-05 DIAGNOSIS — E559 Vitamin D deficiency, unspecified: Secondary | ICD-10-CM

## 2023-07-05 DIAGNOSIS — K581 Irritable bowel syndrome with constipation: Secondary | ICD-10-CM

## 2023-07-05 NOTE — Telephone Encounter (Signed)
Please apply for bilateral knee visco, per Taylor Dale, PA-C. Thanks!  

## 2023-07-11 ENCOUNTER — Telehealth: Payer: Self-pay | Admitting: *Deleted

## 2023-07-11 NOTE — Telephone Encounter (Signed)
Faxed over referral to [facility]. [phone].

## 2023-07-15 ENCOUNTER — Telehealth: Payer: Self-pay

## 2023-07-15 NOTE — Telephone Encounter (Signed)
STD form completed and faxed to [facility] @ [phone]. Beginning LOA 07/26/23 through 09/23/23./DOS 07/26/23

## 2023-07-18 NOTE — Telephone Encounter (Signed)
 VOB submitted for Orthovisc, Bilateral knee(s) BV pending

## 2023-07-23 NOTE — Progress Notes (Signed)
 Surgical Instructions    Your procedure is scheduled on July 26, 2023.  Report to [facility] Main Entrance A at 5:30 A.M., then check in with the Admitting office.  Call this number if you have problems the morning of surgery:  [phone]  If you have any questions prior to your surgery date call [phone]: Open Monday-Friday 8am-4pm If you experience any cold or flu symptoms such as cough, fever, chills, shortness of breath, etc. between now and your scheduled surgery, please notify us  at the above number.     Remember:  Do not eat or drink after midnight the night before your surgery    Take these medicines the morning of surgery with A SIP OF WATER   (SYMBICORT )    IF NEEDED oxymetazoline (AFRIN)  PROAIR  HFA inhaler    As of today, STOP taking any Aspirin (unless otherwise instructed by your surgeon) Aleve, Naproxen, Ibuprofen , Motrin , Advil , Goody's, BC's, all herbal medications, fish oil, and all vitamins.                     Do NOT Smoke (Tobacco/Vaping) for 24 hours prior to your procedure.  If you use a CPAP at night, you may bring your mask/headgear for your overnight stay.   Contacts, glasses, piercing's, hearing aid's, dentures or partials may not be worn into surgery, please bring cases for these belongings.    For patients admitted to the hospital, discharge time will be determined by your treatment team.   Patients discharged the day of surgery will not be allowed to drive home, and someone needs to stay with them for 24 hours.  SURGICAL WAITING ROOM VISITATION Patients having surgery or a procedure may have no more than 2 support people in the waiting area - these visitors may rotate.   Children under the age of 59 must have an adult with them who is not the patient. If the patient needs to stay at the hospital during part of their recovery, the visitor guidelines for inpatient rooms apply. Pre-op nurse will coordinate an appropriate time for 1  support person to accompany patient in pre-op.  This support person may not rotate.   Please refer to the [facility] website for the visitor guidelines for Inpatients (after your surgery is over and you are in a regular room).    Special instructions:   [facility]- Preparing For Surgery  Before surgery, you can play an important role. Because skin is not sterile, your skin needs to be as free of germs as possible. You can reduce the number of germs on your skin by washing with CHG (chlorahexidine gluconate) Soap before surgery.  CHG is an antiseptic cleaner which kills germs and bonds with the skin to continue killing germs even after washing.    Oral Hygiene is also important to reduce your risk of infection.  Remember - BRUSH YOUR TEETH THE MORNING OF SURGERY WITH YOUR REGULAR TOOTHPASTE  Please do not use if you have an allergy to CHG or antibacterial soaps. If your skin becomes reddened/irritated stop using the CHG.  Do not shave (including legs and underarms) for at least 48 hours prior to first CHG shower. It is OK to shave your face.  Please follow these instructions carefully.   Shower the NIGHT BEFORE SURGERY and the MORNING OF SURGERY  If you chose to wash your hair, wash your hair first as usual with your normal shampoo.  After you shampoo, rinse your hair and body thoroughly to  remove the shampoo.  Use CHG Soap as you would any other liquid soap. You can apply CHG directly to the skin and wash gently with a scrungie or a clean washcloth.   Apply the CHG Soap to your body ONLY FROM THE NECK DOWN.  Do not use on open wounds or open sores. Avoid contact with your eyes, ears, mouth and genitals (private parts). Wash Face and genitals (private parts)  with your normal soap.   Wash thoroughly, paying special attention to the area where your surgery will be performed.  Thoroughly rinse your body with warm water  from the neck down.  DO NOT shower/wash with your normal soap after  using and rinsing off the CHG Soap.  Pat yourself dry with a CLEAN TOWEL.  Wear CLEAN PAJAMAS to bed the night before surgery  Place CLEAN SHEETS on your bed the night before your surgery  DO NOT SLEEP WITH PETS.   Day of Surgery: Take a shower with CHG soap. Do not wear jewelry or makeup Do not wear lotions, powders, perfumes/colognes, or deodorant. Do not shave 48 hours prior to surgery.  Men may shave face and neck. Do not bring valuables to the hospital.  [facility] is not responsible for any belongings or valuables. Do not wear nail polish, gel polish, artificial nails, or any other type of covering on natural nails (fingers and toes) If you have artificial nails or gel coating that need to be removed by a nail salon, please have this removed prior to surgery. Artificial nails or gel coating may interfere with anesthesia's ability to adequately monitor your vital signs. Wear Clean/Comfortable clothing the morning of surgery Remember to brush your teeth WITH YOUR REGULAR TOOTHPASTE.   Please read over the following fact sheets that you were given.    If you received a COVID test during your pre-op visit  it is requested that you wear a mask when out in public, stay away from anyone that may not be feeling well and notify your surgeon if you develop symptoms. If you have been in contact with anyone that has tested positive in the last 10 days please notify you surgeon.

## 2023-07-24 ENCOUNTER — Other Ambulatory Visit: Payer: Self-pay

## 2023-07-24 ENCOUNTER — Encounter (HOSPITAL_COMMUNITY): Payer: Self-pay

## 2023-07-24 ENCOUNTER — Encounter (HOSPITAL_COMMUNITY)
Admission: RE | Admit: 2023-07-24 | Discharge: 2023-07-24 | Disposition: A | Discharge status: Home / Self Care | Payer: BC Managed Care – PPO | Source: Ambulatory Visit | Attending: Thoracic Surgery (Cardiothoracic Vascular Surgery) | Admitting: Thoracic Surgery (Cardiothoracic Vascular Surgery)

## 2023-07-24 ENCOUNTER — Ambulatory Visit (HOSPITAL_COMMUNITY)
Admission: RE | Admit: 2023-07-24 | Discharge: 2023-07-24 | Disposition: A | Discharge status: Home / Self Care | Payer: BC Managed Care – PPO | Source: Ambulatory Visit | Attending: Thoracic Surgery (Cardiothoracic Vascular Surgery) | Admitting: Thoracic Surgery (Cardiothoracic Vascular Surgery)

## 2023-07-24 DIAGNOSIS — R7303 Prediabetes: Secondary | ICD-10-CM | POA: Diagnosis not present

## 2023-07-24 DIAGNOSIS — E66813 Obesity, class 3: Secondary | ICD-10-CM | POA: Diagnosis not present

## 2023-07-24 DIAGNOSIS — Z7951 Long term (current) use of inhaled steroids: Secondary | ICD-10-CM | POA: Diagnosis not present

## 2023-07-24 DIAGNOSIS — G4733 Obstructive sleep apnea (adult) (pediatric): Secondary | ICD-10-CM | POA: Diagnosis not present

## 2023-07-24 DIAGNOSIS — E785 Hyperlipidemia, unspecified: Secondary | ICD-10-CM | POA: Diagnosis not present

## 2023-07-24 DIAGNOSIS — Z79899 Other long term (current) drug therapy: Secondary | ICD-10-CM | POA: Diagnosis not present

## 2023-07-24 DIAGNOSIS — Z9889 Other specified postprocedural states: Secondary | ICD-10-CM | POA: Diagnosis not present

## 2023-07-24 DIAGNOSIS — J45909 Unspecified asthma, uncomplicated: Secondary | ICD-10-CM | POA: Diagnosis not present

## 2023-07-24 DIAGNOSIS — J849 Interstitial pulmonary disease, unspecified: Secondary | ICD-10-CM | POA: Diagnosis not present

## 2023-07-24 DIAGNOSIS — Z8249 Family history of ischemic heart disease and other diseases of the circulatory system: Secondary | ICD-10-CM | POA: Diagnosis not present

## 2023-07-24 DIAGNOSIS — D72829 Elevated white blood cell count, unspecified: Secondary | ICD-10-CM | POA: Diagnosis not present

## 2023-07-24 DIAGNOSIS — I517 Cardiomegaly: Secondary | ICD-10-CM | POA: Diagnosis not present

## 2023-07-24 DIAGNOSIS — R918 Other nonspecific abnormal finding of lung field: Secondary | ICD-10-CM | POA: Diagnosis not present

## 2023-07-24 DIAGNOSIS — E559 Vitamin D deficiency, unspecified: Secondary | ICD-10-CM | POA: Diagnosis not present

## 2023-07-24 DIAGNOSIS — R0602 Shortness of breath: Secondary | ICD-10-CM | POA: Diagnosis not present

## 2023-07-24 DIAGNOSIS — Z4682 Encounter for fitting and adjustment of non-vascular catheter: Secondary | ICD-10-CM | POA: Diagnosis not present

## 2023-07-24 DIAGNOSIS — J939 Pneumothorax, unspecified: Secondary | ICD-10-CM | POA: Diagnosis not present

## 2023-07-24 DIAGNOSIS — M810 Age-related osteoporosis without current pathological fracture: Secondary | ICD-10-CM | POA: Diagnosis not present

## 2023-07-24 DIAGNOSIS — I10 Essential (primary) hypertension: Secondary | ICD-10-CM | POA: Diagnosis not present

## 2023-07-24 DIAGNOSIS — Z01818 Encounter for other preprocedural examination: Secondary | ICD-10-CM | POA: Insufficient documentation

## 2023-07-24 DIAGNOSIS — R9389 Abnormal findings on diagnostic imaging of other specified body structures: Secondary | ICD-10-CM | POA: Diagnosis not present

## 2023-07-24 DIAGNOSIS — J449 Chronic obstructive pulmonary disease, unspecified: Secondary | ICD-10-CM | POA: Diagnosis not present

## 2023-07-24 DIAGNOSIS — R140 Abdominal distension (gaseous): Secondary | ICD-10-CM | POA: Diagnosis not present

## 2023-07-24 DIAGNOSIS — Z6841 Body Mass Index (BMI) 40.0 and over, adult: Secondary | ICD-10-CM | POA: Diagnosis not present

## 2023-07-24 HISTORY — DX: Unspecified osteoarthritis, unspecified site: M19.90

## 2023-07-24 LAB — COMPREHENSIVE METABOLIC PANEL
ALT: 15 U/L (ref 0–44)
AST: 18 U/L (ref 15–41)
Albumin: 3.3 g/dL — ABNORMAL LOW (ref 3.5–5.0)
Alkaline Phosphatase: 52 U/L (ref 38–126)
Anion gap: 8 (ref 5–15)
BUN: 11 mg/dL (ref 6–20)
CO2: 28 mmol/L (ref 22–32)
Calcium: 8.7 mg/dL — ABNORMAL LOW (ref 8.9–10.3)
Chloride: 102 mmol/L (ref 98–111)
Creatinine, Ser: 0.8 mg/dL (ref 0.44–1.00)
GFR, Estimated: >60 mL/min (ref >60)
Glucose, Bld: 82 mg/dL (ref 70–99)
Potassium: 4 mmol/L (ref 3.5–5.1)
Sodium: 138 mmol/L (ref 135–145)
Total Bilirubin: 0.6 mg/dL (ref 0.0–1.2)
Total Protein: 7 g/dL (ref 6.5–8.1)

## 2023-07-24 LAB — CBC
HCT: 38.7 % (ref 36.0–46.0)
Hemoglobin: 12.3 g/dL (ref 12.0–15.0)
MCH: 30.2 pg (ref 26.0–34.0)
MCHC: 31.8 g/dL (ref 30.0–36.0)
MCV: 95.1 fL (ref 80.0–100.0)
Platelets: 330 10*3/uL (ref 150–400)
RBC: 4.07 MIL/uL (ref 3.87–5.11)
RDW: 12.6 % (ref 11.5–15.5)
WBC: 4.5 10*3/uL (ref 4.0–10.5)
nRBC: 0 % (ref 0.0–0.2)

## 2023-07-24 LAB — PROTIME-INR
INR: 1.1 (ref 0.8–1.2)
Prothrombin Time: 14 s (ref 11.4–15.2)

## 2023-07-24 LAB — SURGICAL PCR SCREEN
MRSA, PCR: NEGATIVE
Staphylococcus aureus: NEGATIVE

## 2023-07-24 LAB — TYPE AND SCREEN
ABO/RH(D): O POS
Antibody Screen: NEGATIVE

## 2023-07-24 LAB — APTT: aPTT: 38 s — ABNORMAL HIGH (ref 24–36)

## 2023-07-24 NOTE — Progress Notes (Signed)
 PCP - Glendia Freeman  Cardiologist -   PPM/ICD -  denies Device Orders - n/a Rep Notified - n/a  Chest x-ray - 07-24-23 EKG - 07-24-23 Stress Test - 01-10-22 ECHO - 04-22-18 Cardiac Cath -   Sleep Study - denies CPAP -   DM - denies  Blood Thinner Instructions:denies Aspirin Instructions:n/a  Phentermine Last dose 07-19-23  ERAS Protcol - NPO   COVID TEST- n/a   Anesthesia review: no  Patient denies shortness of breath, fever, cough and chest pain at PAT appointment   All instructions explained to the patient, with a verbal understanding of the material. Patient agrees to go over the instructions while at home for a better understanding. Patient also instructed to self quarantine after being tested for COVID-19. The opportunity to ask questions was provided.

## 2023-07-26 ENCOUNTER — Inpatient Hospital Stay (HOSPITAL_COMMUNITY): Payer: BC Managed Care – PPO | Admitting: Anesthesiology

## 2023-07-26 ENCOUNTER — Encounter (HOSPITAL_COMMUNITY): Payer: Self-pay | Admitting: Thoracic Surgery (Cardiothoracic Vascular Surgery)

## 2023-07-26 ENCOUNTER — Inpatient Hospital Stay (HOSPITAL_COMMUNITY)
Admission: RE | Admit: 2023-07-26 | Discharge: 2023-07-28 | DRG: 167 | Disposition: A | Discharge status: Home / Self Care | Payer: BC Managed Care – PPO | Attending: Thoracic Surgery (Cardiothoracic Vascular Surgery) | Admitting: Thoracic Surgery (Cardiothoracic Vascular Surgery)

## 2023-07-26 ENCOUNTER — Other Ambulatory Visit: Payer: Self-pay

## 2023-07-26 ENCOUNTER — Inpatient Hospital Stay (HOSPITAL_COMMUNITY): Payer: BC Managed Care – PPO

## 2023-07-26 ENCOUNTER — Inpatient Hospital Stay (HOSPITAL_COMMUNITY): Payer: BC Managed Care – PPO | Admitting: Physician Assistant

## 2023-07-26 ENCOUNTER — Encounter (HOSPITAL_COMMUNITY)
Admission: RE | Disposition: A | Discharge status: Home / Self Care | Payer: Self-pay | Source: Home / Self Care | Attending: Thoracic Surgery (Cardiothoracic Vascular Surgery)

## 2023-07-26 DIAGNOSIS — D72829 Elevated white blood cell count, unspecified: Secondary | ICD-10-CM | POA: Diagnosis not present

## 2023-07-26 DIAGNOSIS — E66813 Obesity, class 3: Secondary | ICD-10-CM | POA: Diagnosis present

## 2023-07-26 DIAGNOSIS — E785 Hyperlipidemia, unspecified: Secondary | ICD-10-CM | POA: Diagnosis present

## 2023-07-26 DIAGNOSIS — E559 Vitamin D deficiency, unspecified: Secondary | ICD-10-CM | POA: Diagnosis present

## 2023-07-26 DIAGNOSIS — Z79899 Other long term (current) drug therapy: Secondary | ICD-10-CM | POA: Diagnosis not present

## 2023-07-26 DIAGNOSIS — J45909 Unspecified asthma, uncomplicated: Secondary | ICD-10-CM | POA: Diagnosis present

## 2023-07-26 DIAGNOSIS — I10 Essential (primary) hypertension: Secondary | ICD-10-CM | POA: Diagnosis present

## 2023-07-26 DIAGNOSIS — M810 Age-related osteoporosis without current pathological fracture: Secondary | ICD-10-CM | POA: Diagnosis present

## 2023-07-26 DIAGNOSIS — Z8249 Family history of ischemic heart disease and other diseases of the circulatory system: Secondary | ICD-10-CM

## 2023-07-26 DIAGNOSIS — Z7951 Long term (current) use of inhaled steroids: Secondary | ICD-10-CM

## 2023-07-26 DIAGNOSIS — J849 Interstitial pulmonary disease, unspecified: Principal | ICD-10-CM | POA: Diagnosis present

## 2023-07-26 DIAGNOSIS — G4733 Obstructive sleep apnea (adult) (pediatric): Secondary | ICD-10-CM | POA: Diagnosis present

## 2023-07-26 DIAGNOSIS — Z6841 Body Mass Index (BMI) 40.0 and over, adult: Secondary | ICD-10-CM

## 2023-07-26 DIAGNOSIS — R7303 Prediabetes: Secondary | ICD-10-CM | POA: Diagnosis present

## 2023-07-26 HISTORY — PX: LUNG BIOPSY: SHX5088

## 2023-07-26 HISTORY — PX: INTERCOSTAL NERVE BLOCK: SHX5021

## 2023-07-26 LAB — URINALYSIS, ROUTINE W REFLEX MICROSCOPIC
Bilirubin Urine: NEGATIVE
Glucose, UA: NEGATIVE mg/dL
Ketones, ur: NEGATIVE mg/dL
Leukocytes,Ua: NEGATIVE
Nitrite: NEGATIVE
Protein, ur: 30 mg/dL — AB
Specific Gravity, Urine: 1.025 (ref 1.005–1.030)
pH: 5 (ref 5.0–8.0)

## 2023-07-26 LAB — POCT PREGNANCY, URINE: Preg Test, Ur: NEGATIVE

## 2023-07-26 LAB — ABO/RH: ABO/RH(D): O POS

## 2023-07-26 SURGERY — THORACOSCOPY, ROBOT-ASSISTED
Anesthesia: General | Site: Chest | Laterality: Right

## 2023-07-26 MED ORDER — LIDOCAINE 2% (20 MG/ML) 5 ML SYRINGE
INTRAMUSCULAR | Status: DC | PRN
  Administered 2023-07-26: 100 mg via INTRAVENOUS

## 2023-07-26 MED ORDER — OXYCODONE HCL 5 MG/5ML PO SOLN: 5.0000 mg | Freq: Once | ORAL | Status: DC | PRN

## 2023-07-26 MED ORDER — PHENYLEPHRINE 80 MCG/ML (10ML) SYRINGE FOR IV PUSH (FOR BLOOD PRESSURE SUPPORT)
PREFILLED_SYRINGE | INTRAVENOUS | Status: AC
  Filled 2023-07-26: qty 10

## 2023-07-26 MED ORDER — KETOROLAC TROMETHAMINE 15 MG/ML IJ SOLN
15.0000 mg | Freq: Four times a day (QID) | INTRAMUSCULAR | Status: AC
  Administered 2023-07-26 – 2023-07-28 (×8): 15 mg via INTRAVENOUS
  Filled 2023-07-26 (×8): qty 1

## 2023-07-26 MED ORDER — PROPOFOL 10 MG/ML IV BOLUS
INTRAVENOUS | Status: AC
  Filled 2023-07-26: qty 20

## 2023-07-26 MED ORDER — SODIUM CHLORIDE 0.9 % IV SOLN
INTRAVENOUS | Status: AC | PRN
  Administered 2023-07-26: 1000 mL

## 2023-07-26 MED ORDER — CHLORHEXIDINE GLUCONATE 0.12 % MT SOLN
OROMUCOSAL | Status: AC
  Administered 2023-07-26: 15 mL via OROMUCOSAL
  Filled 2023-07-26: qty 15

## 2023-07-26 MED ORDER — SODIUM CHLORIDE 0.9 % IV SOLN
INTRAVENOUS | Status: AC
  Filled 2023-07-26: qty 3

## 2023-07-26 MED ORDER — ONDANSETRON HCL 4 MG/2ML IJ SOLN: 4.0000 mg | Freq: Once | INTRAMUSCULAR | Status: DC | PRN

## 2023-07-26 MED ORDER — ORAL CARE MOUTH RINSE: 15.0000 mL | Freq: Once | OROMUCOSAL | Status: AC

## 2023-07-26 MED ORDER — FENTANYL CITRATE (PF) 100 MCG/2ML IJ SOLN
25.0000 ug | INTRAMUSCULAR | Status: DC | PRN
Start: 2023-07-26 — End: 2023-07-26

## 2023-07-26 MED ORDER — ALBUTEROL SULFATE (2.5 MG/3ML) 0.083% IN NEBU: 2.5000 mg | INHALATION_SOLUTION | Freq: Four times a day (QID) | RESPIRATORY_TRACT | Status: DC | PRN

## 2023-07-26 MED ORDER — ACETAMINOPHEN 160 MG/5ML PO SOLN: 1000.0000 mg | Freq: Four times a day (QID) | ORAL | Status: DC

## 2023-07-26 MED ORDER — BISACODYL 5 MG PO TBEC
10.0000 mg | DELAYED_RELEASE_TABLET | Freq: Every day | ORAL | Status: DC
  Filled 2023-07-26 (×2): qty 2

## 2023-07-26 MED ORDER — BUPIVACAINE HCL (PF) 0.5 % IJ SOLN
INTRAMUSCULAR | Status: AC
  Filled 2023-07-26: qty 30

## 2023-07-26 MED ORDER — DEXMEDETOMIDINE HCL IN NACL 80 MCG/20ML IV SOLN
INTRAVENOUS | Status: DC | PRN
  Administered 2023-07-26 (×2): 8 ug via INTRAVENOUS

## 2023-07-26 MED ORDER — LACTATED RINGERS IV SOLN: INTRAVENOUS | Status: DC

## 2023-07-26 MED ORDER — ACETAMINOPHEN 10 MG/ML IV SOLN: 1000.0000 mg | Freq: Once | INTRAVENOUS | Status: DC | PRN

## 2023-07-26 MED ORDER — DEXAMETHASONE SODIUM PHOSPHATE 10 MG/ML IJ SOLN
INTRAMUSCULAR | Status: DC | PRN
  Administered 2023-07-26: 10 mg via INTRAVENOUS

## 2023-07-26 MED ORDER — CEFAZOLIN SODIUM-DEXTROSE 2-4 GM/100ML-% IV SOLN
2.0000 g | Freq: Three times a day (TID) | INTRAVENOUS | Status: AC
  Administered 2023-07-26 (×2): 2 g via INTRAVENOUS
  Filled 2023-07-26 (×2): qty 100

## 2023-07-26 MED ORDER — ROCURONIUM BROMIDE 10 MG/ML (PF) SYRINGE
PREFILLED_SYRINGE | INTRAVENOUS | Status: DC | PRN
  Administered 2023-07-26: 20 mg via INTRAVENOUS
  Administered 2023-07-26: 100 mg via INTRAVENOUS

## 2023-07-26 MED ORDER — OXYCODONE HCL 5 MG PO TABS
5.0000 mg | ORAL_TABLET | ORAL | Status: DC | PRN
  Administered 2023-07-26 – 2023-07-28 (×9): 5 mg via ORAL
  Filled 2023-07-26 (×9): qty 1

## 2023-07-26 MED ORDER — ONDANSETRON HCL 4 MG/2ML IJ SOLN
INTRAMUSCULAR | Status: DC | PRN
  Administered 2023-07-26: 4 mg via INTRAVENOUS

## 2023-07-26 MED ORDER — ACETAMINOPHEN 500 MG PO TABS
1000.0000 mg | ORAL_TABLET | Freq: Four times a day (QID) | ORAL | Status: DC
  Administered 2023-07-26 – 2023-07-28 (×7): 1000 mg via ORAL
  Filled 2023-07-26 (×7): qty 2

## 2023-07-26 MED ORDER — HYDROMORPHONE HCL 1 MG/ML IJ SOLN
INTRAMUSCULAR | Status: DC | PRN
  Administered 2023-07-26: .5 mg via INTRAVENOUS

## 2023-07-26 MED ORDER — FLUTICASONE PROPIONATE 50 MCG/ACT NA SUSP: 1.0000 | Freq: Every day | NASAL | Status: DC | PRN

## 2023-07-26 MED ORDER — EPHEDRINE 5 MG/ML INJ
INTRAVENOUS | Status: AC
  Filled 2023-07-26: qty 5

## 2023-07-26 MED ORDER — ONDANSETRON HCL 4 MG/2ML IJ SOLN
INTRAMUSCULAR | Status: AC
  Filled 2023-07-26: qty 2

## 2023-07-26 MED ORDER — DEXAMETHASONE SODIUM PHOSPHATE 10 MG/ML IJ SOLN
INTRAMUSCULAR | Status: AC
  Filled 2023-07-26: qty 1

## 2023-07-26 MED ORDER — CHLORHEXIDINE GLUCONATE 0.12 % MT SOLN: 15.0000 mL | Freq: Once | OROMUCOSAL | Status: AC

## 2023-07-26 MED ORDER — LIDOCAINE 2% (20 MG/ML) 5 ML SYRINGE
INTRAMUSCULAR | Status: AC
  Filled 2023-07-26: qty 5

## 2023-07-26 MED ORDER — LACTATED RINGERS IV SOLN
INTRAVENOUS | Status: DC | PRN
Start: 2023-07-26 — End: 2023-07-26

## 2023-07-26 MED ORDER — PROPOFOL 10 MG/ML IV BOLUS
INTRAVENOUS | Status: DC | PRN
  Administered 2023-07-26: 200 mg via INTRAVENOUS

## 2023-07-26 MED ORDER — TRAMADOL HCL 50 MG PO TABS
50.0000 mg | ORAL_TABLET | Freq: Four times a day (QID) | ORAL | Status: DC | PRN
  Administered 2023-07-27 (×2): 50 mg via ORAL
  Filled 2023-07-26 (×2): qty 1

## 2023-07-26 MED ORDER — PANTOPRAZOLE SODIUM 40 MG PO TBEC
40.0000 mg | DELAYED_RELEASE_TABLET | Freq: Every day | ORAL | Status: DC
  Administered 2023-07-27 – 2023-07-28 (×2): 40 mg via ORAL
  Filled 2023-07-26 (×2): qty 1

## 2023-07-26 MED ORDER — FENTANYL CITRATE (PF) 250 MCG/5ML IJ SOLN
INTRAMUSCULAR | Status: DC | PRN
  Administered 2023-07-26: 50 ug via INTRAVENOUS
  Administered 2023-07-26: 100 ug via INTRAVENOUS

## 2023-07-26 MED ORDER — ONDANSETRON HCL 4 MG/2ML IJ SOLN: 4.0000 mg | Freq: Four times a day (QID) | INTRAMUSCULAR | Status: DC | PRN

## 2023-07-26 MED ORDER — LINACLOTIDE 145 MCG PO CAPS
145.0000 ug | ORAL_CAPSULE | Freq: Every day | ORAL | Status: DC
Start: 2023-07-26 — End: 2023-07-28
  Administered 2023-07-28: 145 ug via ORAL
  Filled 2023-07-26 (×3): qty 1

## 2023-07-26 MED ORDER — OXYCODONE HCL 5 MG PO TABS: 5.0000 mg | ORAL_TABLET | Freq: Once | ORAL | Status: DC | PRN

## 2023-07-26 MED ORDER — ROCURONIUM BROMIDE 10 MG/ML (PF) SYRINGE
PREFILLED_SYRINGE | INTRAVENOUS | Status: AC
  Filled 2023-07-26: qty 10

## 2023-07-26 MED ORDER — SUGAMMADEX SODIUM 200 MG/2ML IV SOLN
INTRAVENOUS | Status: DC | PRN
  Administered 2023-07-26: 300 mg via INTRAVENOUS

## 2023-07-26 MED ORDER — ALBUTEROL SULFATE HFA 108 (90 BASE) MCG/ACT IN AERS: 2.0000 | INHALATION_SPRAY | RESPIRATORY_TRACT | Status: DC | PRN

## 2023-07-26 MED ORDER — ENOXAPARIN SODIUM 40 MG/0.4ML IJ SOSY
40.0000 mg | PREFILLED_SYRINGE | Freq: Every day | INTRAMUSCULAR | Status: DC
  Administered 2023-07-26 – 2023-07-27 (×2): 40 mg via SUBCUTANEOUS
  Filled 2023-07-26 (×2): qty 0.4

## 2023-07-26 MED ORDER — FENTANYL CITRATE (PF) 250 MCG/5ML IJ SOLN
INTRAMUSCULAR | Status: AC
  Filled 2023-07-26: qty 5

## 2023-07-26 MED ORDER — 0.9 % SODIUM CHLORIDE (POUR BTL) OPTIME
TOPICAL | Status: DC | PRN
  Administered 2023-07-26: 2000 mL

## 2023-07-26 MED ORDER — HYDROMORPHONE HCL 1 MG/ML IJ SOLN
INTRAMUSCULAR | Status: AC
  Filled 2023-07-26: qty 0.5

## 2023-07-26 MED ORDER — PHENYLEPHRINE HCL-NACL 20-0.9 MG/250ML-% IV SOLN
INTRAVENOUS | Status: DC | PRN
  Administered 2023-07-26: 30 ug/min via INTRAVENOUS

## 2023-07-26 MED ORDER — BUPIVACAINE LIPOSOME 1.3 % IJ SUSP
INTRAMUSCULAR | Status: AC
  Filled 2023-07-26: qty 20

## 2023-07-26 MED ORDER — SENNOSIDES-DOCUSATE SODIUM 8.6-50 MG PO TABS
1.0000 | ORAL_TABLET | Freq: Every day | ORAL | Status: DC
  Filled 2023-07-26: qty 1

## 2023-07-26 MED ORDER — MOMETASONE FURO-FORMOTEROL FUM 200-5 MCG/ACT IN AERO
2.0000 | INHALATION_SPRAY | Freq: Two times a day (BID) | RESPIRATORY_TRACT | Status: DC
  Administered 2023-07-26 – 2023-07-28 (×4): 2 via RESPIRATORY_TRACT
  Filled 2023-07-26: qty 8.8

## 2023-07-26 MED ORDER — SODIUM CHLORIDE FLUSH 0.9 % IV SOLN
INTRAVENOUS | Status: DC | PRN
  Administered 2023-07-26: 100 mL

## 2023-07-26 MED ORDER — SODIUM CHLORIDE 0.9 % IV SOLN
3.0000 g | INTRAVENOUS | Status: AC
  Administered 2023-07-26: 3 g via INTRAVENOUS

## 2023-07-26 SURGICAL SUPPLY — 72 items
APPLIER CLIP ROT 10 11.4 M/L (STAPLE)
BLADE CLIPPER SURG (BLADE) ×2 IMPLANT
CANISTER SUCT 3000ML PPV (MISCELLANEOUS) ×2 IMPLANT
CANNULA REDUCER 12-8 DVNC XI (CANNULA) ×4 IMPLANT
CATH THORACIC 28FR (CATHETERS) IMPLANT
CLIP APPLIE ROT 10 11.4 M/L (STAPLE) IMPLANT
CNTNR URN SCR LID CUP LEK RST (MISCELLANEOUS) ×10 IMPLANT
CONN ST 1/4X3/8 BEN (MISCELLANEOUS) IMPLANT
DEFOGGER SCOPE WARMER CLEARIFY (MISCELLANEOUS) ×2 IMPLANT
DERMABOND ADVANCED .7 DNX12 (GAUZE/BANDAGES/DRESSINGS) IMPLANT
DRAIN CHANNEL 28F RND 3/8 FF (WOUND CARE) IMPLANT
DRAIN CHANNEL 32F RND 10.7 FF (WOUND CARE) IMPLANT
DRAPE ARM DVNC X/XI (DISPOSABLE) ×8 IMPLANT
DRAPE COLUMN DVNC XI (DISPOSABLE) ×2 IMPLANT
DRAPE CV SPLIT W-CLR ANES SCRN (DRAPES) ×2 IMPLANT
DRAPE INCISE IOBAN 66X45 STRL (DRAPES) IMPLANT
DRAPE SURG ORHT 6 SPLT 77X108 (DRAPES) ×2 IMPLANT
ELECT BLADE 6.5 EXT (BLADE) ×2 IMPLANT
ELECT REM PT RETURN 9FT ADLT (ELECTROSURGICAL) ×2
ELECTRODE REM PT RTRN 9FT ADLT (ELECTROSURGICAL) ×2 IMPLANT
FORCEPS BPLR FENES DVNC XI (FORCEP) IMPLANT
FORCEPS BPLR LNG DVNC XI (INSTRUMENTS) IMPLANT
GAUZE SPONGE 4X4 12PLY STRL (GAUZE/BANDAGES/DRESSINGS) ×2 IMPLANT
GLOVE SS BIOGEL STRL SZ 7.5 (GLOVE) ×4 IMPLANT
GOWN STRL REUS W/ TWL LRG LVL3 (GOWN DISPOSABLE) ×4 IMPLANT
GOWN STRL REUS W/ TWL XL LVL3 (GOWN DISPOSABLE) ×4 IMPLANT
GOWN STRL REUS W/TWL 2XL LVL3 (GOWN DISPOSABLE) ×2 IMPLANT
GRASPER TIP-UP FEN DVNC XI (INSTRUMENTS) IMPLANT
HEMOSTAT SURGICEL 2X14 (HEMOSTASIS) ×4 IMPLANT
IRRIGATION STRYKERFLOW (MISCELLANEOUS) ×2 IMPLANT
IRRIGATOR STRYKERFLOW (MISCELLANEOUS) ×2
IV NS 1000ML BAXH (IV SOLUTION) ×2 IMPLANT
KIT SUCTION CATH 14FR (SUCTIONS) IMPLANT
KIT TURNOVER KIT B (KITS) ×2 IMPLANT
NDL HYPO 25GX1X1/2 BEV (NEEDLE) ×2 IMPLANT
NDL SPNL 22GX3.5 QUINCKE BK (NEEDLE) ×2 IMPLANT
NEEDLE HYPO 25GX1X1/2 BEV (NEEDLE) ×2
NEEDLE SPNL 22GX3.5 QUINCKE BK (NEEDLE) ×2
NS IRRIG 1000ML POUR BTL (IV SOLUTION) ×2 IMPLANT
PACK CHEST (CUSTOM PROCEDURE TRAY) ×2 IMPLANT
PAD ARMBOARD 7.5X6 YLW CONV (MISCELLANEOUS) ×4 IMPLANT
RELOAD STAPLE 45 3.5 BLU DVNC (STAPLE) IMPLANT
RELOAD STAPLE 45 4.3 GRN DVNC (STAPLE) IMPLANT
SCISSORS LAP 5X35 DISP (ENDOMECHANICALS) IMPLANT
SEAL UNIV 5-12 XI (MISCELLANEOUS) ×8 IMPLANT
SET TRI-LUMEN FLTR TB AIRSEAL (TUBING) ×2 IMPLANT
SPONGE INTESTINAL PEANUT (DISPOSABLE) IMPLANT
SPONGE TONSIL 1.5 RFD TRIPLE (SPONGE) ×2 IMPLANT
STAPLER 45 SUREFORM DVNC (STAPLE) IMPLANT
STAPLER RELOAD 3.5X45 BLU DVNC (STAPLE) ×8
STAPLER RELOAD 4.3X45 GRN DVNC (STAPLE) ×2
SUT PDS AB 3-0 SH 27 (SUTURE) IMPLANT
SUT PROLENE 4-0 RB1 .5 CRCL 36 (SUTURE) IMPLANT
SUT SILK 1 MH (SUTURE) ×2 IMPLANT
SUT SILK 2 0 SH (SUTURE) IMPLANT
SUT SILK 2 0SH CR/8 30 (SUTURE) IMPLANT
SUT SILK 3 0SH CR/8 30 (SUTURE) IMPLANT
SUT VIC AB 1 CTX36XBRD ANBCTR (SUTURE) ×2 IMPLANT
SUT VIC AB 2-0 CTX 36 (SUTURE) ×2 IMPLANT
SUT VIC AB 3-0 X1 27 (SUTURE) ×4 IMPLANT
SUT VICRYL 0 TIES 12 18 (SUTURE) ×2 IMPLANT
SUT VICRYL 0 UR6 27IN ABS (SUTURE) ×4 IMPLANT
SUT VICRYL 2 TP 1 (SUTURE) IMPLANT
SYR 20CC LL (SYRINGE) ×4 IMPLANT
SYSTEM RETRIEVAL ANCHOR 8 (MISCELLANEOUS) IMPLANT
SYSTEM SAHARA CHEST DRAIN ATS (WOUND CARE) ×2 IMPLANT
TAPE CLOTH 4X10 WHT NS (GAUZE/BANDAGES/DRESSINGS) ×2 IMPLANT
TOWEL GREEN STERILE (TOWEL DISPOSABLE) ×2 IMPLANT
TRAY FOLEY MTR SLVR 16FR STAT (SET/KITS/TRAYS/PACK) ×2 IMPLANT
TRAY FOLEY W/BAG SLVR 14FR (SET/KITS/TRAYS/PACK) IMPLANT
TROCAR PORT AIRSEAL 12X150 (TUBING) ×2 IMPLANT
WATER STERILE IRR 1000ML POUR (IV SOLUTION) ×2 IMPLANT

## 2023-07-26 NOTE — Op Note (Signed)
 NAMESTAISHA, Katie Knight MEDICAL RECORD NO: [id number] ACCOUNT NO: [id number] DATE OF BIRTH: [birth year] FACILITY: [facility] LOCATION: [facility] PHYSICIAN: Elspeth BROCKS. Kerrin, MD  Operative Report   DATE OF PROCEDURE: 07/26/2023  PREOPERATIVE DIAGNOSIS:  Interstitial lung disease.  POSTOPERATIVE DIAGNOSIS:  Interstitial lung disease.  PROCEDURE:   Xi robotic-assisted right thoracoscopy for lung biopsy x3,  Intercostal nerve blocks levels 3 through 10.  SURGEON:  Elspeth BROCKS. Kerrin, MD  ASSISTANT:  Lemond Cera, PA-C.  ANESTHESIA:  General  FINDINGS:  Abnormal-appearing lung at the base of the lower and middle lobes.  Frozen section revealed some fibrosis and interstitial changes.  No evidence of malignancy or granulomas.  Abnormal-appearing level 7 node, sent for permanent pathology.  CLINICAL NOTE:  Katie Knight is a 48 year old woman with chronic dyspnea and interstitial lung disease.  She has had severe persistent cough refractory to medications and worsening shortness of breath.  CT showed mild progression of interstitial and peribronchovascular ground-glass opacities.  She was referred for surgical lung biopsy.  The indications, risks, benefits, and alternatives were discussed in detail with the patient.  She understood and accepted the risks and agreed to proceed.  She does understand this procedure is diagnostic and not therapeutic.  OPERATIVE NOTE:  Katie Knight was brought to the operating room on 07/26/2023.  She had induction of general anesthesia and was intubated with a double endotracheal tube.  Intravenous antibiotics were administered.  A Foley catheter was placed.  Sequential compression devices were placed on the calves for DVT prophylaxis.  She was placed in a left lateral decubitus position.  A Bair Hugger was placed for active warming.  The right chest was prepped and draped in the usual sterile fashion.  Single lung ventilation of the left lung was initiated and was  tolerated well throughout the procedure.   An incision was made in the 8th interspace in the mid-axillary line.  An 8 mm robotic port was inserted.  After confirming intrapleural placement, carbon dioxide was insufflated per protocol.  A 12 mm robotic port was placed in the 8th interspace anterior to the camera port.  Intercostal nerve blocks then were performed injecting 10 mL of a bupivacaine  solution into a subpleural plane at each level from the third to the 10th interspace.  The bupivacaine  solution consisted of 20 mL of liposomal bupivacaine , 30 mL of 0.5% bupivacaine , and 50 mL of saline.  This solution was also used for local at the incision sites.  A 12 mm air seal port was placed in the 10th interspace posterolaterally and then a third robotic port was placed posterior to the camera port in the 8th interspace.  The robot was deployed.  The camera arm was docked.  Targeting was performed.  The remaining arms were docked.  The robotic instruments were inserted with thoracoscopic visualization.   Inspection of the lung showed relatively normal appearance of the superior portion of the lower lobe.  At the base of the lower lobe and also of the middle lobe, there was a nodular appearance dramatically different from the remainder of the lung.  Biopsies were obtained using a robotic stapler.  The first biopsy was sent for frozen section.  A portion of the second biopsy was sent for AFB and fungal cultures.  The remainder of that specimen and the third biopsy were sent for permanent pathology.  The first two biopsies were taken from the lower lobe and the third biopsy was taken from the right middle lobe.  There was good  hemostasis at all the staple lines.  Inspection in the chest showed an enlarged abnormal-appearing level 7 lymph node.  This node was removed and sent for permanent pathology.  The robotic instruments were removed.  The robot was undocked.  A 28-French chest tube was placed through the  anterior incision and secured with #1 silk suture.  Dual lung ventilation was resumed.  The remaining incisions were closed in standard fashion.  All sponge, needle, and instrument counts were correct at the end of the procedure.  The patient was taken from the operating room to the postanesthesia care unit, intubated, in good condition.  Experienced assistance was necessary for this case due to surgical complexity.  Lemond Cera assisted with port placement, robot docking and undocking, instrument exchange, specimen retrieval, and wound closure.   VAI D: 07/26/2023 3:39:45 pm T: 07/26/2023 8:36:00 pm  JOB: [id number]/ [id number]

## 2023-07-26 NOTE — Anesthesia Preprocedure Evaluation (Signed)
 Anesthesia Evaluation  Patient identified by MRN, date of birth, ID band Patient awake    Reviewed: Allergy & Precautions, NPO status , Patient's Chart, lab work & pertinent test results, reviewed documented beta blocker date and time   History of Anesthesia Complications Negative for: history of anesthetic complications  Airway Mallampati: III  TM Distance: >3 FB Neck ROM: Full    Dental no notable dental hx.    Pulmonary asthma , neg COPD ILD   breath sounds clear to auscultation       Cardiovascular hypertension, (-) Past MI, (-) Cardiac Stents and (-) CABG (-) dysrhythmias  Rhythm:Regular Rate:Normal     Neuro/Psych neg Seizures    GI/Hepatic ,neg GERD  ,,(+) neg Cirrhosis        Endo/Other    Class 3 obesity  Renal/GU Renal disease     Musculoskeletal  (+) Arthritis ,    Abdominal   Peds  Hematology   Anesthesia Other Findings   Reproductive/Obstetrics                              Anesthesia Physical Anesthesia Plan  ASA: 3  Anesthesia Plan: General   Post-op Pain Management:    Induction: Intravenous  PONV Risk Score and Plan: 2 and Ondansetron   Airway Management Planned: Double Lumen EBT  Additional Equipment:   Intra-op Plan:   Post-operative Plan: Extubation in OR  Informed Consent: I have reviewed the patients History and Physical, chart, labs and discussed the procedure including the risks, benefits and alternatives for the proposed anesthesia with the patient or authorized representative who has indicated his/her understanding and acceptance.     Dental advisory given  Plan Discussed with: CRNA  Anesthesia Plan Comments:          Anesthesia Quick Evaluation

## 2023-07-26 NOTE — Interval H&P Note (Signed)
 History and Physical Interval Note:  07/26/2023 7:07 AM  Katie Knight  has presented today for surgery, with the diagnosis of ILD.  The various methods of treatment have been discussed with the patient and family. After consideration of risks, benefits and other options for treatment, the patient has consented to  Procedure(s): XI ROBOTIC ASSISTED THORACOSCOPY (Right) LUNG BIOPSY (Right) as a surgical intervention.  The patient's history has been reviewed, patient examined, no change in status, stable for surgery.  I have reviewed the patient's chart and labs.  Questions were answered to the patient's satisfaction.     Elspeth JAYSON Millers

## 2023-07-26 NOTE — Brief Op Note (Addendum)
 07/26/2023  9:12 AM  PATIENT:  Katie Knight  48 y.o. female  PRE-OPERATIVE DIAGNOSIS:  ILD  POST-OPERATIVE DIAGNOSIS:  ILD  PROCEDURE:  Procedure(s): XI ROBOTIC ASSISTED THORACOSCOPY (Right) LUNG BIOPSY (Right) INTERCOSTAL NERVE BLOCK (Right)  SURGEON:  Surgeons and Role:    * Kerrin Elspeth BROCKS, MD - Primary  PHYSICIAN ASSISTANT: WAYNE GOLD PA-C  ANESTHESIA:   local, general, and INTERCOSTAL NERVE BLOCK  EBL:  25 ML  BLOOD ADMINISTERED:none  DRAINS:  1 28 F Chest Tube(s) in the RIGHT HEMITHORAX    LOCAL MEDICATIONS USED:  BUPIVICAINE  and  EXPAREL   SPECIMEN:  Source of Specimen:  WEDGE BIOPSY X 3  DISPOSITION OF SPECIMEN:   PATH/MICRO  COUNTS: CORRECT  TOURNIQUET:  * No tourniquets in log *  DICTATION: .Other Dictation: Dictation Number PENDING  PLAN OF CARE: Admit to inpatient   PATIENT DISPOSITION:  PACU - hemodynamically stable.   Delay start of Pharmacological VTE agent (>24hrs) due to surgical blood loss or risk of bleeding: no  COMPLICATIONS: NO KNOWN

## 2023-07-26 NOTE — Transfer of Care (Signed)
 Immediate Anesthesia Transfer of Care Note  Patient: Katie Knight  Procedure(s) Performed: XI ROBOTIC ASSISTED THORACOSCOPY (Right: Chest) LUNG BIOPSY (Right) INTERCOSTAL NERVE BLOCK (Right: Chest)  Patient Location: PACU  Anesthesia Type:General  Level of Consciousness: awake, alert , and oriented  Airway & Oxygen  Therapy: Patient Spontanous Breathing and Patient connected to face mask oxygen   Post-op Assessment: Report given to RN and Post -op Vital signs reviewed and stable  Post vital signs: Reviewed and stable  Last Vitals:  Vitals Value Taken Time  BP 134/76 07/26/23 0930  Temp    Pulse 74 07/26/23 0935  Resp 21 07/26/23 0935  SpO2 95 % 07/26/23 0935  Vitals shown include unfiled device data.  Last Pain:  Vitals:   07/26/23 0618  TempSrc:   PainSc: 0-No pain         Complications: No notable events documented.

## 2023-07-26 NOTE — Anesthesia Postprocedure Evaluation (Signed)
 Anesthesia Post Note  Patient: Katie Knight  Procedure(s) Performed: XI ROBOTIC ASSISTED THORACOSCOPY (Right: Chest) LUNG BIOPSY (Right) INTERCOSTAL NERVE BLOCK (Right: Chest)     Patient location during evaluation: PACU Anesthesia Type: General Level of consciousness: awake and alert Pain management: pain level controlled Vital Signs Assessment: post-procedure vital signs reviewed and stable Respiratory status: spontaneous breathing, nonlabored ventilation, respiratory function stable and patient connected to nasal cannula oxygen  Cardiovascular status: blood pressure returned to baseline and stable Postop Assessment: no apparent nausea or vomiting Anesthetic complications: no   There were no known notable events for this encounter.  Last Vitals:  Vitals:   07/26/23 1045 07/26/23 1110  BP: 139/87 (!) 149/86  Pulse: 74 68  Resp: 12 17  Temp: (!) 36.1 C (!) 36.2 C  SpO2: 99% 98%    Last Pain:  Vitals:   07/26/23 1110  TempSrc: Axillary  PainSc: Asleep                 Lynwood MARLA Cornea

## 2023-07-26 NOTE — Discharge Summary (Addendum)
 Physician Discharge Summary       [address] [place]       Katie Knight [address]             [phone]    Patient ID: Katie Knight MRN: [id number] DOB/AGE: [birth year] 47 y.o.  Admit date: 07/26/2023 Discharge date: 07/30/2023  Admission Diagnoses: Interstitial lung disease  Discharge Diagnoses:  Interstitial lung disease  Patient Active Problem List   Diagnosis Date Noted   ILD (interstitial lung disease) (HCC) 07/26/2023   Prediabetes 05/21/2023   Other fatigue 05/07/2023   SOBOE (shortness of breath on exertion) 05/07/2023   Vitamin D  deficiency 05/07/2023   Snoring 05/07/2023   Depression screen 05/07/2023   BMI 40.0-44.9, adult (HCC) 05/07/2023   Morbid obesity with starting BMI 43.7 05/07/2023   Elevated blood pressure reading 04/23/2023   Chronic pain of both knees 04/23/2023   Asthma 08/27/2022   Chronic cough 08/27/2022   Abnormal CT of the chest 08/27/2022     Consults: None  Procedure (s):  surgery Operative Report    DATE OF PROCEDURE: 07/26/2023   PREOPERATIVE DIAGNOSIS:  Interstitial lung disease.   POSTOPERATIVE DIAGNOSIS:  Interstitial lung disease.   PROCEDURE:  Xi robotic-assisted right thoracoscopy for lung biopsy x3, intercostal nerve blocks levels 3 through 10.   SURGEON:  Elspeth BROCKS. Kerrin, MD   ASSISTANT:  Lemond Cera.   ANESTHESIA:  General  PCP is Katie Hamilton, MD Referring Provider is Hunsucker, Donnice SAUNDERS, MD   HPI: Katie Knight is sent for consultation regarding biopsy for interstitial lung disease.   Katie Knight is a 48 year old woman with history of asthma, hypertension, hyperlipidemia, osteoporosis, chronic dyspnea, and interstitial lung disease.  She has a longstanding history of asthma.  Over a year ago she had a persistent severe cough.  It was refractory to medications.  She would often cough so hard that she would throw up.  Also has had worsening shortness of breath.  She can walk up a flight of  stairs but would have to stop and catch her breath.  She works at the [employer].  She works 12-hour shifts and has to do a lot of physical things at work.   She had had a bronchoscopic biopsy but that was nondiagnostic.  Recent CT of the chest showed some mild progression of interstitial and peribronchovascular groundglass opacities.  The patient was referred to Dr. Kerrin and thoracic surgical consultation and following evaluation of the patient and all relevant studies he recommended robotic assisted thoracoscopic surgery for lung biopsy for diagnostic purposes.  Hospital course:  The patient was admitted electively on 07/26/2023 at which time she was taken to the operating room and underwent right robotic assisted thoracoscopic surgery for lung biopsy x 3.  Additionally she had 1 lymph node biopsy.  She tolerated the procedure well and was taken to the postanesthesia care unit in stable condition.  Postoperative hospital course:  She has done well.  Chest tube was managed in a routine manner and removed on postop day #1.  Surgical pathology is pending.  Oxygen  was weaned but she continued to have needs during the night and it is suspected that she may have obstructive sleep apnea.  She will likely require a sleep study in the future.  Have made arrangements for her to have nighttime home oxygen  until this can be accomplished.  Incisions were noted to be healing well without evidence of infection.  She was tolerating diet and routine activity.  He  had a very minor expected acute blood loss anemia which was stable.  Renal function has remained within normal limits.  Overall, at the time of discharge she was felt to be quite stable.    Latest Vital Signs: Blood pressure 130/75, pulse 75, temperature 98 F (36.7 C), temperature source Oral, resp. rate (!) 27, height 5' 5 (1.651 m), weight 117.9 kg, last menstrual period 07/10/2023, SpO2 93%.  Physical Exam: General appearance:  alert, cooperative, and no distress Heart: regular rate and rhythm Lungs: clear to auscultation bilaterally Abdomen: benign Extremities: no edema or calf tenderness Wound: incis healing well    Discharge Condition:good  Recent laboratory studies:  Lab Results  Component Value Date   WBC 7.9 07/28/2023   HGB 10.9 (L) 07/28/2023   HCT 34.2 (L) 07/28/2023   MCV 94.5 07/28/2023   PLT 288 07/28/2023   Lab Results  Component Value Date   NA 135 07/28/2023   K 3.3 (L) 07/28/2023   CL 101 07/28/2023   CO2 27 07/28/2023   CREATININE 0.77 07/28/2023   GLUCOSE 109 (H) 07/28/2023      Diagnostic Studies: DG Chest 1 View Result Date: 07/28/2023 CLINICAL DATA:  Follow-up right pneumothorax. EXAM: CHEST  1 VIEW COMPARISON:  07/27/2023 FINDINGS: Previously seen tiny right apical pneumothorax is no longer visualized. Bibasilar pulmonary opacity, left side greater than right, is unchanged. No new or worsening opacity. Stable heart size. IMPRESSION: Resolution of previously seen tiny right apical pneumothorax. Unchanged bibasilar pulmonary opacity, left side greater than right. Electronically Signed   By: Norleen DELENA Kil M.D.   On: 07/28/2023 09:20   DG CHEST PORT 1 VIEW Result Date: 07/27/2023 CLINICAL DATA:  [id number].  Encounter for chest tube removal. EXAM: PORTABLE CHEST 1 VIEW COMPARISON:  Portable chest earlier today at 6:45 a.m. FINDINGS: 8:04 p.m. interval right chest tube removal. Minimal right apical pneumothorax continues to be seen, estimated 5% or less of the chest volume. Bibasilar opacities are again noted and could indicate atelectasis or consolidation, with elevated right hemidiaphragm. There is no substantial pleural effusion. There is mild cardiomegaly without CHF. Remaining lungs are clear. Mild thoracic dextroscoliosis.  No new osseous findings. IMPRESSION: 1. Interval right chest tube removal. Minimal right apical pneumothorax continues to be seen, estimated 5% or less of the chest  volume. 2. Bibasilar opacities could indicate atelectasis or consolidation, with elevated right hemidiaphragm. No new consolidation. 3. Mild cardiomegaly without CHF. Electronically Signed   By: Francis Quam M.D.   On: 07/27/2023 20:18   DG Chest Port 1 View Result Date: 07/27/2023 CLINICAL DATA:  Lung biopsy, interstitial lung disease. EXAM: PORTABLE CHEST 1 VIEW COMPARISON:  07/26/2023 FINDINGS: Right chest tube remains in place. Small right apical pneumothorax again noted, slightly increased in size but remains less than 5%. Heart and mediastinal contours are stable. Bibasilar opacities, favor atelectasis, unchanged. No effusions. IMPRESSION: Right chest tube with slight increased size of the small right apical pneumothorax. Bibasilar opacities, likely atelectasis. Electronically Signed   By: Franky Crease M.D.   On: 07/27/2023 08:31   DG CHEST PORT 1 VIEW Result Date: 07/26/2023 CLINICAL DATA:  Chest tube in place. EXAM: PORTABLE CHEST 1 VIEW COMPARISON:  Radiograph earlier today FINDINGS: Right chest tube directed towards the apex. Potential tiny right apical pneumothorax. Patchy opacity at the right lung base, similar. Retrocardiac opacity is unchanged. No other interval change. Stable heart size and mediastinal contours. IMPRESSION: 1. Right chest tube directed towards the apex. Potential tiny right apical pneumothorax.  2. Unchanged bibasilar opacities. Electronically Signed   By: Andrea Gasman M.D.   On: 07/26/2023 19:01   DG Chest Port 1 View Result Date: 07/26/2023 CLINICAL DATA:  48 year old female postoperative day zero status post Thoracoscopy with right lung biopsy. Interstitial lung disease. EXAM: PORTABLE CHEST 1 VIEW COMPARISON:  Chest radiographs 07/24/2023 and earlier. FINDINGS: Portable AP semi upright view at 0938 hours. New right chest tube coursing to the right lung apex. Lower lung volumes. No pneumothorax or pleural effusion identified. Lung base crowding of markings  superimposed on asymmetric pre-existing patchy lung base opacity. Mediastinal contours are stable and within normal limits. Visualized tracheal air column is within normal limits. No acute osseous abnormality identified. Paucity of bowel gas. IMPRESSION: Right chest tube. Lower lung volumes. No pneumothorax or pleural effusion identified. Electronically Signed   By: VEAR Hurst M.D.   On: 07/26/2023 10:23   DG Chest 2 View Result Date: 07/25/2023 CLINICAL DATA:  Interstitial lung disease. Preop chest exam. Upcoming lung biopsy. EXAM: CHEST - 2 VIEW COMPARISON:  Radiograph 04/30/2022, CT 05/27/2023 FINDINGS: The heart is normal in size. Mediastinal contours are unchanged. Mild elevation of right hemidiaphragm is unchanged from prior exam. Ill-defined opacity in a peripheral predominant distribution is without significant interval change. No pleural effusion or pneumothorax. On limited assessment, no acute osseous findings IMPRESSION: Stable radiographic appearance of the chest. Peripheral predominant ill-defined opacity which was better characterized on recent CT. Electronically Signed   By: Andrea Gasman M.D.   On: 07/25/2023 17:25    PATHOLOGY: pending   Discharge Instructions     Discharge patient   Complete by: As directed    Discharge disposition: 01-Home or Self Care   Discharge patient date: 07/28/2023       Discharge Medications: Allergies as of 07/28/2023   No Known Allergies      Medication List     STOP taking these medications    HYDROcodone  bit-homatropine 5-1.5 MG/5ML syrup Commonly known as: HYCODAN       TAKE these medications    budesonide -formoterol  160-4.5 MCG/ACT inhaler Commonly known as: SYMBICORT  Inhale 2 puffs into the lungs in the morning and at bedtime. What changed:  when to take this reasons to take this   fluticasone  50 MCG/ACT nasal spray Commonly known as: FLONASE  Place 1 spray into both nostrils 2 (two) times daily.   Linzess  145 MCG Caps  capsule Generic drug: linaclotide  Take 145 mcg by mouth daily.   multivitamin with minerals tablet Take 1 tablet by mouth daily. gummy   oxymetazoline 0.05 % nasal spray Commonly known as: AFRIN Place 1 spray into both nostrils daily as needed for congestion.   phentermine 30 MG capsule Take 30 mg by mouth daily.   ProAir  HFA 108 (90 Base) MCG/ACT inhaler Generic drug: albuterol  Inhale 2 puffs into the lungs every 4 (four) hours as needed for wheezing or shortness of breath.   Tezspire  210 MG/1. Soaj Generic drug: Tezepelumab -ekko Inject 210 mg into the skin every 28 (twenty-eight) days.   traMADol  50 MG tablet Commonly known as: ULTRAM  Take 1 tablet (50 mg total) by mouth every 12 (twelve) hours as needed for moderate pain (pain score 4-6).   Vitamin D  (Ergocalciferol ) 1.25 MG (50000 UNIT) Caps capsule Commonly known as: DRISDOL  Take 1 capsule (50,000 Units total) by mouth every 7 (seven) days.        Follow Up Appointments:  Follow-up Information     Kerrin Elspeth BROCKS, MD Follow up.  Specialty: Cardiothoracic Surgery Why: Please see discharge paperwork for details of follow-up appointment with Dr. Kerrin.  You will also have a nurse appointment scheduled for suture removal Contact information: [address] [place] [address] [phone]         [facility] Follow up.   Why: On the date you are scheduled to see Dr. Kerrin in the office obtain a chest x-ray at [facility] 1 hour prior to this appointment. Contact information: [address] [place] [address] [place]  [address]                Signed: Wayne E GoldPA-C 07/30/2023, 9:42 AM

## 2023-07-26 NOTE — Anesthesia Procedure Notes (Signed)
 Procedure Name: Intubation Date/Time: 07/26/2023 7:40 AM  Performed by: Loreda Rodriguez, CRNAPre-anesthesia Checklist: Patient identified, Emergency Drugs available, Suction available and Patient being monitored Patient Re-evaluated:Patient Re-evaluated prior to induction Oxygen  Delivery Method: Circle System Utilized Preoxygenation: Pre-oxygenation with 100% oxygen  Induction Type: IV induction Ventilation: Mask ventilation without difficulty Laryngoscope Size: Mac and 3 Grade View: Grade I Tube type: Oral Endobronchial tube: Double lumen EBT and 37 Fr Number of attempts: 1 Airway Equipment and Method: Stylet and Oral airway Placement Confirmation: ETT inserted through vocal cords under direct vision, positive ETCO2 and breath sounds checked- equal and bilateral Secured at: 27 cm Tube secured with: Tape Dental Injury: Teeth and Oropharynx as per pre-operative assessment

## 2023-07-26 NOTE — Care Management (Signed)
  Transition of Care [facility]) Screening Note   Patient Details  Name: Tymber Stallings Date of Birth: [birth year]   [facility]) CM/SW Contact:    Corean JAYSON Canary, RN Phone Number: 07/26/2023, 12:13 PM    Transition of Care Department [facility]) has reviewed patient and no TOC needs have been identified at this time. We will continue to monitor patient advancement through interdisciplinary progression rounds. If new patient transition needs arise, please place a TOC consult.

## 2023-07-26 NOTE — Hospital Course (Addendum)
 PCP is Larnell Hamilton, MD Referring Provider is Hunsucker, Donnice SAUNDERS, MD   HPI: [name] is sent for consultation regarding biopsy for interstitial lung disease.   Katie Knight is a 48 year old woman with history of asthma, hypertension, hyperlipidemia, osteoporosis, chronic dyspnea, and interstitial lung disease.  She has a longstanding history of asthma.  Over a year ago she had a persistent severe cough.  It was refractory to medications.  She would often cough so hard that she would throw up.  Also has had worsening shortness of breath.  She can walk up a flight of stairs but would have to stop and catch her breath.  She works at the [employer].  She works 12-hour shifts and has to do a lot of physical things at work.   She had had a bronchoscopic biopsy but that was nondiagnostic.  Recent CT of the chest showed some mild progression of interstitial and peribronchovascular groundglass opacities.  The patient was referred to Dr. Kerrin and thoracic surgical consultation and following evaluation of the patient and all relevant studies he recommended robotic assisted thoracoscopic surgery for lung biopsy for diagnostic purposes.  Hospital course:  The patient was admitted electively on 07/26/2023 at which time she was taken to the operating room and underwent right robotic assisted thoracoscopic surgery for lung biopsy x 3.  Additionally she had 1 lymph node biopsy.  She tolerated the procedure well and was taken to the postanesthesia care unit in stable condition.  Postoperative hospital course:  She has done well.  Chest tube was managed in a routine manner and removed on postop day #1.  Surgical pathology is pending.  Oxygen  was weaned but she continued to have needs during the night and it is suspected that she may have obstructive sleep apnea.  She will likely require a sleep study in the future.  Have made arrangements for her to have nighttime home oxygen  until this  can be accomplished.  Incisions were noted to be healing well without evidence of infection.  She was tolerating diet and routine activity.  He had a very minor expected acute blood loss anemia which was stable.  Renal function has remained within normal limits.  Overall, at the time of discharge she was felt to be quite stable.

## 2023-07-27 ENCOUNTER — Encounter (HOSPITAL_COMMUNITY): Payer: Self-pay | Admitting: Thoracic Surgery (Cardiothoracic Vascular Surgery)

## 2023-07-27 ENCOUNTER — Inpatient Hospital Stay (HOSPITAL_COMMUNITY): Payer: BC Managed Care – PPO

## 2023-07-27 LAB — CBC
HCT: 35.1 % — ABNORMAL LOW (ref 36.0–46.0)
Hemoglobin: 11.4 g/dL — ABNORMAL LOW (ref 12.0–15.0)
MCH: 30.3 pg (ref 26.0–34.0)
MCHC: 32.5 g/dL (ref 30.0–36.0)
MCV: 93.4 fL (ref 80.0–100.0)
Platelets: 262 10*3/uL (ref 150–400)
RBC: 3.76 MIL/uL — ABNORMAL LOW (ref 3.87–5.11)
RDW: 12.6 % (ref 11.5–15.5)
WBC: 13 10*3/uL — ABNORMAL HIGH (ref 4.0–10.5)
nRBC: 0 % (ref 0.0–0.2)

## 2023-07-27 LAB — BASIC METABOLIC PANEL
Anion gap: 7 (ref 5–15)
BUN: 9 mg/dL (ref 6–20)
CO2: 26 mmol/L (ref 22–32)
Calcium: 8 mg/dL — ABNORMAL LOW (ref 8.9–10.3)
Chloride: 100 mmol/L (ref 98–111)
Creatinine, Ser: 0.7 mg/dL (ref 0.44–1.00)
GFR, Estimated: >60 mL/min (ref >60)
Glucose, Bld: 111 mg/dL — ABNORMAL HIGH (ref 70–99)
Potassium: 4 mmol/L (ref 3.5–5.1)
Sodium: 133 mmol/L — ABNORMAL LOW (ref 135–145)

## 2023-07-27 NOTE — Progress Notes (Addendum)
 TCTS  1 Day Post-Op Procedure(s) (LRB): XI ROBOTIC ASSISTED THORACOSCOPY (Right) LUNG BIOPSY (Right) INTERCOSTAL NERVE BLOCK (Right) Subjective: Feels very sore  Objective: Vital signs in last 24 hours: Temp:  [97 F (36.1 C)-97.8 F (36.6 C)] 97.6 F (36.4 C) (01/11 0823) Pulse Rate:  [62-81] 67 (01/11 0823) Cardiac Rhythm: Normal sinus rhythm (01/11 0712) Resp:  [12-21] 20 (01/11 0823) BP: (97-149)/(70-87) 109/72 (01/11 0823) SpO2:  [97 %-100 %] 98 % (01/11 0823) FiO2 (%):  [32 %] 32 % (01/11 0821)  Hemodynamic parameters for last 24 hours:    Intake/Output from previous day: 01/10 0701 - 01/11 0700 In: 1320 [P.O.:720; I.V.:500; IV Piggyback:100] Out: 1855 [Urine:1750; Blood:5; Chest Tube:100] Intake/Output this shift: Total I/O In: 240 [P.O.:240] Out: -   General appearance: alert, cooperative, and no distress Heart: regular rate and rhythm Lungs: clear to auscultation bilaterally Abdomen: benign Extremities: no edema or calf tenderness Wound: incis healing well  Lab Results: Recent Labs    07/24/23 1320 07/27/23 0158  WBC 4.5 13.0*  HGB 12.3 11.4*  HCT 38.7 35.1*  PLT 330 262   BMET:  Recent Labs    07/24/23 1320 07/27/23 0158  NA 138 133*  K 4.0 4.0  CL 102 100  CO2 28 26  GLUCOSE 82 111*  BUN 11 9  CREATININE 0.80 0.70  CALCIUM 8.7* 8.0*    PT/INR:  Recent Labs    07/24/23 1320  LABPROT 14.0  INR 1.1   ABG No results found for: PHART, HCO3, TCO2, ACIDBASEDEF, O2SAT CBG (last 3)  No results for input(s): GLUCAP in the last 72 hours.  Meds Scheduled Meds:  acetaminophen   1,000 mg Oral Q6H   Or   acetaminophen  (TYLENOL ) oral liquid 160 mg/5 mL  1,000 mg Oral Q6H   bisacodyl   10 mg Oral Daily   enoxaparin  (LOVENOX ) injection  40 mg Subcutaneous QHS   ketorolac   15 mg Intravenous Q6H   linaclotide   145 mcg Oral Daily   mometasone -formoterol   2 puff Inhalation BID   pantoprazole   40 mg Oral Daily   senna-docusate  1  tablet Oral QHS   Continuous Infusions: PRN Meds:.albuterol , fluticasone , ondansetron  (ZOFRAN ) IV, oxyCODONE , traMADol   Xrays DG Chest Port 1 View Result Date: 07/27/2023 CLINICAL DATA:  Lung biopsy, interstitial lung disease. EXAM: PORTABLE CHEST 1 VIEW COMPARISON:  07/26/2023 FINDINGS: Right chest tube remains in place. Small right apical pneumothorax again noted, slightly increased in size but remains less than 5%. Heart and mediastinal contours are stable. Bibasilar opacities, favor atelectasis, unchanged. No effusions. IMPRESSION: Right chest tube with slight increased size of the small right apical pneumothorax. Bibasilar opacities, likely atelectasis. Electronically Signed   By: Franky Crease M.D.   On: 07/27/2023 08:31   DG CHEST PORT 1 VIEW Result Date: 07/26/2023 CLINICAL DATA:  Chest tube in place. EXAM: PORTABLE CHEST 1 VIEW COMPARISON:  Radiograph earlier today FINDINGS: Right chest tube directed towards the apex. Potential tiny right apical pneumothorax. Patchy opacity at the right lung base, similar. Retrocardiac opacity is unchanged. No other interval change. Stable heart size and mediastinal contours. IMPRESSION: 1. Right chest tube directed towards the apex. Potential tiny right apical pneumothorax. 2. Unchanged bibasilar opacities. Electronically Signed   By: Andrea Gasman M.D.   On: 07/26/2023 19:01   DG Chest Port 1 View Result Date: 07/26/2023 CLINICAL DATA:  48 year old female postoperative day zero status post Thoracoscopy with right lung biopsy. Interstitial lung disease. EXAM: PORTABLE CHEST 1 VIEW COMPARISON:  Chest  radiographs 07/24/2023 and earlier. FINDINGS: Portable AP semi upright view at 0938 hours. New right chest tube coursing to the right lung apex. Lower lung volumes. No pneumothorax or pleural effusion identified. Lung base crowding of markings superimposed on asymmetric pre-existing patchy lung base opacity. Mediastinal contours are stable and within normal  limits. Visualized tracheal air column is within normal limits. No acute osseous abnormality identified. Paucity of bowel gas. IMPRESSION: Right chest tube. Lower lung volumes. No pneumothorax or pleural effusion identified. Electronically Signed   By: VEAR Hurst M.D.   On: 07/26/2023 10:23    Assessment/Plan: S/P Procedure(s) (LRB): XI ROBOTIC ASSISTED THORACOSCOPY (Right) LUNG BIOPSY (Right) INTERCOSTAL NERVE BLOCK (Right) POD#1  1 afeb, s BP 90's-140's, sinus rhythm 2 O2 sats good on 2-3 liters [place] 3 good UOP 4 CT 100 ml, no air leak- will remove tube today 5 CXR- small right apical pntx, likely from when tube became disconnected last evening for a short time interval  6 minor hyponatremia, sodium 133 7 normal renal fxn 8 reactive leukocytosis- WBC 13 9 minor expected ABLA 10 rehab /pulm hygiene / wean O2- likely home in am        LOS: 1 day    Lemond FORBES Cera PA-C Pager [phone] 07/27/2023   Chart reviewed, patient examined, agree with above.  CXR ok this am. Tiny apical ptx. No air leak. Remove chest tube and repeat CXR in am.

## 2023-07-28 ENCOUNTER — Inpatient Hospital Stay (HOSPITAL_COMMUNITY): Payer: BC Managed Care – PPO

## 2023-07-28 LAB — COMPREHENSIVE METABOLIC PANEL
ALT: 14 U/L (ref 0–44)
AST: 17 U/L (ref 15–41)
Albumin: 2.6 g/dL — ABNORMAL LOW (ref 3.5–5.0)
Alkaline Phosphatase: 45 U/L (ref 38–126)
Anion gap: 7 (ref 5–15)
BUN: 14 mg/dL (ref 6–20)
CO2: 27 mmol/L (ref 22–32)
Calcium: 8.2 mg/dL — ABNORMAL LOW (ref 8.9–10.3)
Chloride: 101 mmol/L (ref 98–111)
Creatinine, Ser: 0.77 mg/dL (ref 0.44–1.00)
GFR, Estimated: >60 mL/min (ref >60)
Glucose, Bld: 109 mg/dL — ABNORMAL HIGH (ref 70–99)
Potassium: 3.3 mmol/L — ABNORMAL LOW (ref 3.5–5.1)
Sodium: 135 mmol/L (ref 135–145)
Total Bilirubin: 0.6 mg/dL (ref 0.0–1.2)
Total Protein: 6 g/dL — ABNORMAL LOW (ref 6.5–8.1)

## 2023-07-28 LAB — CBC
HCT: 34.2 % — ABNORMAL LOW (ref 36.0–46.0)
Hemoglobin: 10.9 g/dL — ABNORMAL LOW (ref 12.0–15.0)
MCH: 30.1 pg (ref 26.0–34.0)
MCHC: 31.9 g/dL (ref 30.0–36.0)
MCV: 94.5 fL (ref 80.0–100.0)
Platelets: 288 10*3/uL (ref 150–400)
RBC: 3.62 MIL/uL — ABNORMAL LOW (ref 3.87–5.11)
RDW: 12.9 % (ref 11.5–15.5)
WBC: 7.9 10*3/uL (ref 4.0–10.5)
nRBC: 0 % (ref 0.0–0.2)

## 2023-07-28 LAB — ACID FAST SMEAR (AFB, MYCOBACTERIA): Acid Fast Smear: NEGATIVE

## 2023-07-28 MED ORDER — TRAMADOL HCL 50 MG PO TABS: 50.0000 mg | ORAL_TABLET | Freq: Two times a day (BID) | ORAL | 0 refills | Status: DC | PRN

## 2023-07-28 MED ORDER — POTASSIUM CHLORIDE CRYS ER 20 MEQ PO TBCR
40.0000 meq | EXTENDED_RELEASE_TABLET | Freq: Once | ORAL | Status: AC
  Administered 2023-07-28: 40 meq via ORAL
  Filled 2023-07-28: qty 2

## 2023-07-28 NOTE — Progress Notes (Signed)
 Nurse requested Mobility Specialist to perform oxygen  saturation test with pt which includes removing pt from oxygen  both at rest and while ambulating.  Below are the results from that testing.     Patient Saturations on Room Air at Rest = spO2 95%  Patient Saturations on Room Air while Ambulating = sp02 91% .  Rested and performed pursed lip breathing for 1 minute with sp02 at 93%.  At end of testing pt left in room on RA  Reported results to nurse.

## 2023-07-28 NOTE — TOC Transition Note (Signed)
 Transition of Care [facility]) - Discharge Note   Patient Details  Name: Katie Knight MRN: [id number] Date of Birth: December 28, 1975  [facility]) CM/SW Contact:  Robynn Eileen Hoose, RN Phone Number: 07/28/2023, 9:22 AM   Clinical Narrative:     Patient is being discharged today. Attempt made to arrange home oxygen  at 0745 through Jermaine with [employer]. Ambulatory oxygen  sat note needed. Secure message to floor staff sent at [phone] to inform of need for ambulatory sat note. Continue to await required note for home oxygen  to be ordered.   Final next level of care: Home/Self Care     Patient Goals and CMS Choice            Discharge Placement                       Discharge Plan and Services Additional resources added to the After Visit Summary for                                       Social Drivers of Health (SDOH) Interventions SDOH Screenings   Tobacco Use: Low Risk  (07/26/2023)     Readmission Risk Interventions     No data to display

## 2023-07-28 NOTE — Plan of Care (Signed)
  Problem: Education: Goal: Knowledge of General Education information will improve Description: Including pain rating scale, medication(s)/side effects and non-pharmacologic comfort measures Outcome: Adequate for Discharge   Problem: Health Behavior/Discharge Planning: Goal: Ability to manage health-related needs will improve Outcome: Adequate for Discharge   Problem: Clinical Measurements: Goal: Ability to maintain clinical measurements within normal limits will improve Outcome: Adequate for Discharge Goal: Will remain free from infection Outcome: Adequate for Discharge Goal: Diagnostic test results will improve Outcome: Adequate for Discharge Goal: Respiratory complications will improve Outcome: Adequate for Discharge Goal: Cardiovascular complication will be avoided Outcome: Adequate for Discharge   Problem: Activity: Goal: Risk for activity intolerance will decrease Outcome: Adequate for Discharge   Problem: Nutrition: Goal: Adequate nutrition will be maintained Outcome: Adequate for Discharge   Problem: Coping: Goal: Level of anxiety will decrease Outcome: Adequate for Discharge   Problem: Elimination: Goal: Will not experience complications related to bowel motility Outcome: Adequate for Discharge Goal: Will not experience complications related to urinary retention Outcome: Adequate for Discharge   Problem: Pain Management: Goal: General experience of comfort will improve Outcome: Adequate for Discharge   Problem: Safety: Goal: Ability to remain free from injury will improve Outcome: Adequate for Discharge   Problem: Skin Integrity: Goal: Risk for impaired skin integrity will decrease Outcome: Adequate for Discharge   Problem: Education: Goal: Knowledge of disease or condition will improve Outcome: Adequate for Discharge Goal: Knowledge of the prescribed therapeutic regimen will improve Outcome: Adequate for Discharge   Problem: Activity: Goal: Risk  for activity intolerance will decrease Outcome: Adequate for Discharge   Problem: Cardiac: Goal: Will achieve and/or maintain hemodynamic stability Outcome: Adequate for Discharge   Problem: Clinical Measurements: Goal: Postoperative complications will be avoided or minimized Outcome: Adequate for Discharge   Problem: Respiratory: Goal: Respiratory status will improve Outcome: Adequate for Discharge   Problem: Pain Management: Goal: Pain level will decrease Outcome: Adequate for Discharge   Problem: Skin Integrity: Goal: Wound healing without signs and symptoms infection will improve Outcome: Adequate for Discharge

## 2023-07-28 NOTE — Progress Notes (Signed)
      [address]       [place] [address]             [phone]     Patient desats at night to 88 percent or lower and has symptoms of OSA. Will likely need Sleep study to know for sure but would be safer to have night time oxygen   Darlin Stenseth E Calah Gershman, PA-C

## 2023-07-28 NOTE — Progress Notes (Addendum)
 2 Days Post-Op Procedure(s) (LRB): XI ROBOTIC ASSISTED THORACOSCOPY (Right) LUNG BIOPSY (Right) INTERCOSTAL NERVE BLOCK (Right) Subjective: Feels well  Objective: Vital signs in last 24 hours: Temp:  [97.5 F (36.4 C)-97.8 F (36.6 C)] 97.5 F (36.4 C) (01/12 0450) Pulse Rate:  [62-82] 81 (01/12 0450) Cardiac Rhythm: Normal sinus rhythm (01/12 0450) Resp:  [15-24] 15 (01/12 0450) BP: (109-126)/(65-72) 119/67 (01/12 0450) SpO2:  [90 %-98 %] 98 % (01/12 0450) FiO2 (%):  [32 %] 32 % (01/11 0821)  Hemodynamic parameters for last 24 hours:    Intake/Output from previous day: 01/11 0701 - 01/12 0700 In: 360 [P.O.:360] Out: 30 [Chest Tube:30] Intake/Output this shift: No intake/output data recorded.  General appearance: alert, cooperative, and no distress Heart: regular rate and rhythm Lungs: clear to auscultation bilaterally Abdomen: benign Extremities: no edema or calf tenderness Wound: incis healing well  Lab Results: Recent Labs    07/27/23 0158 07/28/23 0208  WBC 13.0* 7.9  HGB 11.4* 10.9*  HCT 35.1* 34.2*  PLT 262 288   BMET:  Recent Labs    07/27/23 0158 07/28/23 0208  NA 133* 135  K 4.0 3.3*  CL 100 101  CO2 26 27  GLUCOSE 111* 109*  BUN 9 14  CREATININE 0.70 0.77  CALCIUM 8.0* 8.2*    PT/INR: No results for input(s): LABPROT, INR in the last 72 hours. ABG No results found for: PHART, HCO3, TCO2, ACIDBASEDEF, O2SAT CBG (last 3)  No results for input(s): GLUCAP in the last 72 hours.  Meds Scheduled Meds:  acetaminophen   1,000 mg Oral Q6H   Or   acetaminophen  (TYLENOL ) oral liquid 160 mg/5 mL  1,000 mg Oral Q6H   bisacodyl   10 mg Oral Daily   enoxaparin  (LOVENOX ) injection  40 mg Subcutaneous QHS   linaclotide   145 mcg Oral Daily   mometasone -formoterol   2 puff Inhalation BID   pantoprazole   40 mg Oral Daily   senna-docusate  1 tablet Oral QHS   Continuous Infusions: PRN Meds:.albuterol , fluticasone , ondansetron  (ZOFRAN )  IV, oxyCODONE , traMADol   Xrays DG CHEST PORT 1 VIEW Result Date: 07/27/2023 CLINICAL DATA:  [id number].  Encounter for chest tube removal. EXAM: PORTABLE CHEST 1 VIEW COMPARISON:  Portable chest earlier today at 6:45 a.m. FINDINGS: 8:04 p.m. interval right chest tube removal. Minimal right apical pneumothorax continues to be seen, estimated 5% or less of the chest volume. Bibasilar opacities are again noted and could indicate atelectasis or consolidation, with elevated right hemidiaphragm. There is no substantial pleural effusion. There is mild cardiomegaly without CHF. Remaining lungs are clear. Mild thoracic dextroscoliosis.  No new osseous findings. IMPRESSION: 1. Interval right chest tube removal. Minimal right apical pneumothorax continues to be seen, estimated 5% or less of the chest volume. 2. Bibasilar opacities could indicate atelectasis or consolidation, with elevated right hemidiaphragm. No new consolidation. 3. Mild cardiomegaly without CHF. Electronically Signed   By: Francis Quam M.D.   On: 07/27/2023 20:18   DG Chest Port 1 View Result Date: 07/27/2023 CLINICAL DATA:  Lung biopsy, interstitial lung disease. EXAM: PORTABLE CHEST 1 VIEW COMPARISON:  07/26/2023 FINDINGS: Right chest tube remains in place. Small right apical pneumothorax again noted, slightly increased in size but remains less than 5%. Heart and mediastinal contours are stable. Bibasilar opacities, favor atelectasis, unchanged. No effusions. IMPRESSION: Right chest tube with slight increased size of the small right apical pneumothorax. Bibasilar opacities, likely atelectasis. Electronically Signed   By: Franky Crease M.D.   On: 07/27/2023 08:31  DG CHEST PORT 1 VIEW Result Date: 07/26/2023 CLINICAL DATA:  Chest tube in place. EXAM: PORTABLE CHEST 1 VIEW COMPARISON:  Radiograph earlier today FINDINGS: Right chest tube directed towards the apex. Potential tiny right apical pneumothorax. Patchy opacity at the right lung base,  similar. Retrocardiac opacity is unchanged. No other interval change. Stable heart size and mediastinal contours. IMPRESSION: 1. Right chest tube directed towards the apex. Potential tiny right apical pneumothorax. 2. Unchanged bibasilar opacities. Electronically Signed   By: Andrea Gasman M.D.   On: 07/26/2023 19:01   DG Chest Port 1 View Result Date: 07/26/2023 CLINICAL DATA:  48 year old female postoperative day zero status post Thoracoscopy with right lung biopsy. Interstitial lung disease. EXAM: PORTABLE CHEST 1 VIEW COMPARISON:  Chest radiographs 07/24/2023 and earlier. FINDINGS: Portable AP semi upright view at 0938 hours. New right chest tube coursing to the right lung apex. Lower lung volumes. No pneumothorax or pleural effusion identified. Lung base crowding of markings superimposed on asymmetric pre-existing patchy lung base opacity. Mediastinal contours are stable and within normal limits. Visualized tracheal air column is within normal limits. No acute osseous abnormality identified. Paucity of bowel gas. IMPRESSION: Right chest tube. Lower lung volumes. No pneumothorax or pleural effusion identified. Electronically Signed   By: VEAR Hurst M.D.   On: 07/26/2023 10:23    Assessment/Plan: S/P Procedure(s) (LRB): XI ROBOTIC ASSISTED THORACOSCOPY (Right) LUNG BIOPSY (Right) INTERCOSTAL NERVE BLOCK (Right) POD#2  1 afeb, VSS 2 sats good all day replaced at night for some desats- friend reports she snores and sometimes has issues with breathing at night, will need to have sleep study to eval for OSA- will see if we can get nighttime O2 arranged 3 K+ 3.3- will replace 4 leukocytosis resolved 5 H/H pretty stable minor expected ABLA 6 CXR stable, no pntx 7 stable for d/c       LOS: 2 days    Lemond FORBES Cera PA-C Pager [phone] 07/28/2023

## 2023-07-28 NOTE — Progress Notes (Signed)
 Mobility Specialist Progress Note:   07/28/23 0931  Mobility  Activity Ambulated with assistance in hallway  Level of Assistance Standby assist, set-up cues, supervision of patient - no hands on  Assistive Device None  Distance Ambulated (ft) 320 ft  Activity Response Tolerated well  Mobility Referral Yes  Mobility visit 1 Mobility  Mobility Specialist Start Time (ACUTE ONLY) 0907  Mobility Specialist Stop Time (ACUTE ONLY) 0928  Mobility Specialist Time Calculation (min) (ACUTE ONLY) 21 min   Pre Mobility: 88 HR , 95% SpO2 RA During Mobility: 126 HR , 91% SpO2 RA Post Mobility: 94 HR , 96% SpO2 RA  Pt received in bed, agreeable to mobility. No hands on assist needed for OOB activity. Pt c/o SOB during ambulation, pursed lip breathing encouraged. Rated SOB 7/10. SpO2 stayed in low 90's during ambulation with good pleth. Pt denied any dizziness or lightheadedness during ambulation. Pt left in bed with call bell in reach and all needs met. RN notified.   Katie Knight  Mobility Specialist Please contact via [employer] office at [phone]

## 2023-07-30 NOTE — Telephone Encounter (Addendum)
 Visco gel injections are not covered under patient's medical plan and also not available through patient's pharmacy benefits.  Patient is aware that the injections are not covered by her insurance and will discuss other options with [place] at her appointment on 08/19/23.

## 2023-07-31 ENCOUNTER — Other Ambulatory Visit: Payer: Self-pay | Admitting: *Deleted

## 2023-07-31 ENCOUNTER — Telehealth: Payer: Self-pay | Admitting: *Deleted

## 2023-07-31 MED ORDER — TRAMADOL HCL 50 MG PO TABS: 50.0000 mg | ORAL_TABLET | Freq: Four times a day (QID) | ORAL | 0 refills | Status: DC | PRN

## 2023-07-31 NOTE — Telephone Encounter (Signed)
 Patient contacted the office requesting something to help with post surgical pain. Per patient she was discharged with Tramadol  50mg  Q12hrs. States Tylenol  during the day is not alleviating pain. Per E. Barrett, PA, Tramadol  refill sent to pharmacy with frequency change to every 6 hours. Patient made aware of refill and med changes.

## 2023-08-01 ENCOUNTER — Other Ambulatory Visit: Payer: Self-pay | Admitting: Thoracic Surgery (Cardiothoracic Vascular Surgery)

## 2023-08-01 DIAGNOSIS — Z4802 Encounter for removal of sutures: Secondary | ICD-10-CM

## 2023-08-01 DIAGNOSIS — J849 Interstitial pulmonary disease, unspecified: Secondary | ICD-10-CM

## 2023-08-01 MED ORDER — OXYCODONE HCL 5 MG PO TABS: 5.0000 mg | ORAL_TABLET | ORAL | 0 refills | Status: DC | PRN

## 2023-08-01 NOTE — Progress Notes (Signed)
Oxycodone 5 mg PO Q6 PRN, # 25, no refills Dc tramadol  Viviann Spare C. Dorris Fetch, MD [facility] [phone]

## 2023-08-05 NOTE — Progress Notes (Unsigned)
Office Visit Note  Patient: Katie Knight             Date of Birth: Sep 27, 1975           MRN: [id number]             PCP: [name], MD Referring: [name], MD Visit Date: 08/19/2023 Occupation: @GUAROCC @  Subjective:  Discuss lung biopsy results   History of Present Illness: Katie Knight is a 48 y.o. female with history of osteoarthritis.  Since her last office visit she underwent thoracoscopy on 07/26/2023 and had a robotic assisted lung biopsy.  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  She is scheduled to follow-up with Dr. Judeth Horn on 09/09/2023. She is no longer using oxygen.  According to the patient her pulse ox measured 98% saturation yesterday.  She remains on tezspire once every 28 days along with using symbicort, flonase, afrin, and proair.    She has ongoing pain in both knees but denies any increased joint swelling.  She states her insurance declined the use of visco-supplementation. She denies any other new or worsening symptoms.      Activities of Daily Living:  Patient reports morning stiffness for 15 minutes.   Patient Reports nocturnal pain.  Difficulty dressing/grooming: Denies Difficulty climbing stairs: Reports Difficulty getting out of chair: Reports Difficulty using hands for taps, buttons, cutlery, and/or writing: Denies  Review of Systems  Constitutional:  Positive for fatigue.  HENT:  Negative for mouth sores, mouth dryness and nose dryness.   Eyes:  Negative for pain and dryness.  Respiratory:  Positive for cough, shortness of breath, wheezing and difficulty breathing.   Cardiovascular:  Negative for chest pain and palpitations.  Gastrointestinal:  Negative for blood in stool, constipation and diarrhea.  Endocrine: Negative for increased urination.  Genitourinary:  Negative for involuntary urination.  Musculoskeletal:  Positive for joint pain, gait problem, joint pain, joint swelling, myalgias, muscle weakness, morning stiffness, muscle  tenderness and myalgias.  Skin:  Negative for color change, rash, hair loss and sensitivity to sunlight.  Allergic/Immunologic: Positive for susceptible to infections.  Neurological:  Negative for dizziness and headaches.  Hematological:  Negative for swollen glands.  Psychiatric/Behavioral:  Positive for sleep disturbance. Negative for depressed mood. The patient is nervous/anxious.     PMFS History:  Patient Active Problem List   Diagnosis Date Noted   ILD (interstitial lung disease) (HCC) 07/26/2023   Prediabetes 05/21/2023   Other fatigue 05/07/2023   SOBOE (shortness of breath on exertion) 05/07/2023   Vitamin D deficiency 05/07/2023   Snoring 05/07/2023   Depression screen 05/07/2023   BMI 40.0-44.9, adult (HCC) 05/07/2023   Morbid obesity with starting BMI 43.7 05/07/2023   Elevated blood pressure reading 04/23/2023   Chronic pain of both knees 04/23/2023   Asthma 08/27/2022   Chronic cough 08/27/2022   Abnormal CT of the chest 08/27/2022    Past Medical History:  Diagnosis Date   Arthritis    Asthma    High blood pressure    Hyperlipidemia    Joint pain    Osteoporosis    SOB (shortness of breath)    Vitamin D deficiency     Family History  Problem Relation Age of Onset   Hypertension Mother    Hypertension Father    Colon cancer Neg Hx    Esophageal cancer Neg Hx    Rectal cancer Neg Hx    Stomach cancer Neg Hx    Past Surgical History:  Procedure Laterality Date  BRONCHIAL BIOPSY  04/30/2022   Procedure: BRONCHIAL BIOPSIES;  Surgeon: Karren Burly, MD;  Location: [facility];  Service: Endoscopy;;   BRONCHIAL NEEDLE ASPIRATION BIOPSY  04/30/2022   Procedure: BRONCHIAL NEEDLE ASPIRATION BIOPSIES;  Surgeon: Karren Burly, MD;  Location: [facility];  Service: Endoscopy;;   BRONCHIAL WASHINGS  04/30/2022   Procedure: BRONCHIAL WASHINGS;  Surgeon: Karren Burly, MD;  Location: [facility];  Service: Endoscopy;;   DILATION AND  CURETTAGE OF UTERUS     ENDOBRONCHIAL ULTRASOUND N/A 04/30/2022   Procedure: ENDOBRONCHIAL ULTRASOUND;  Surgeon: Karren Burly, MD;  Location: [facility];  Service: Endoscopy;  Laterality: N/A;   INTERCOSTAL NERVE BLOCK Right 07/26/2023   Procedure: INTERCOSTAL NERVE BLOCK;  Surgeon: Loreli Slot, MD;  Location: [facility] OR;  Service: Thoracic;  Laterality: Right;   LUNG BIOPSY Right 07/26/2023   Procedure: LUNG BIOPSY;  Surgeon: Loreli Slot, MD;  Location: [facility] OR;  Service: Thoracic;  Laterality: Right;   VIDEO BRONCHOSCOPY  04/30/2022   Procedure: VIDEO BRONCHOSCOPY WITHOUT FLUORO;  Surgeon: Karren Burly, MD;  Location: [facility];  Service: Endoscopy;;   Social History   Social History Narrative   Not on file    There is no immunization history on file for this patient.   Objective: Vital Signs: BP (!) 139/92 (BP Location: Left Arm, Patient Position: Sitting)   Pulse 88   Resp 17   Ht 5\' 4"  (1.626 m)   Wt 266 lb 9.6 oz (120.9 kg)   LMP 07/10/2023 (Exact Date)   BMI 45.76 kg/m    Physical Exam Vitals and nursing note reviewed.  Constitutional:      Appearance: She is well-developed.  HENT:     Head: Normocephalic and atraumatic.  Eyes:     Conjunctiva/sclera: Conjunctivae normal.  Cardiovascular:     Rate and Rhythm: Normal rate and regular rhythm.     Heart sounds: Normal heart sounds.  Pulmonary:     Effort: Pulmonary effort is normal.     Breath sounds: Normal breath sounds.  Abdominal:     General: Bowel sounds are normal.     Palpations: Abdomen is soft.  Musculoskeletal:     Cervical back: Normal range of motion.  Lymphadenopathy:     Cervical: No cervical adenopathy.  Skin:    General: Skin is warm and dry.     Capillary Refill: Capillary refill takes less than 2 seconds.  Neurological:     Mental Status: She is alert and oriented to person, place, and time.  Psychiatric:        Behavior: Behavior normal.       Musculoskeletal Exam: C-spine, thoracic spine, and lumbar spine good ROM.  Shoulder joints, elbow joints, wrist joints, MCPs, PIPs, and DIPs good ROM with no synovitis.  Complete fist formation bilaterally.  Hip joints have good ROM with no groin pain.  Knee joints have good ROM with no warmth or effusion. Mild crepitus noted in both knees.  Ankle joints have good ROM with no tenderness or joint swelling.  CDAI Exam: CDAI Score: -- Patient Global: --; Provider Global: -- Swollen: --; Tender: -- Joint Exam 08/19/2023   No joint exam has been documented for this visit   There is currently no information documented on the homunculus. Go to the Rheumatology activity and complete the homunculus joint exam.  Investigation: No additional findings.  Imaging: DG Chest 2 View Result Date: 08/06/2023 CLINICAL DATA:  Status post VATS. Shortness of breath and  chest pain. EXAM: CHEST - 2 VIEW COMPARISON:  Chest radiograph dated 07/28/2023. FINDINGS: The right lung base atelectasis or infiltrate similar to prior radiograph. Improved aeration of the left lung base. Small right pleural effusion suspected. No pneumothorax. The cardiac silhouette is within normal limits. No acute osseous pathology. IMPRESSION: Right lung base atelectasis or infiltrate. Electronically Signed   By: Elgie Collard M.D.   On: 08/06/2023 16:18   DG Chest 1 View Result Date: 07/28/2023 CLINICAL DATA:  Follow-up right pneumothorax. EXAM: CHEST  1 VIEW COMPARISON:  07/27/2023 FINDINGS: Previously seen tiny right apical pneumothorax is no longer visualized. Bibasilar pulmonary opacity, left side greater than right, is unchanged. No new or worsening opacity. Stable heart size. IMPRESSION: Resolution of previously seen tiny right apical pneumothorax. Unchanged bibasilar pulmonary opacity, left side greater than right. Electronically Signed   By: Danae Orleans M.D.   On: 07/28/2023 09:20   DG CHEST PORT 1 VIEW Result Date:  07/27/2023 CLINICAL DATA:  [id number].  Encounter for chest tube removal. EXAM: PORTABLE CHEST 1 VIEW COMPARISON:  Portable chest earlier today at 6:45 a.m. FINDINGS: 8:04 p.m. interval right chest tube removal. Minimal right apical pneumothorax continues to be seen, estimated 5% or less of the chest volume. Bibasilar opacities are again noted and could indicate atelectasis or consolidation, with elevated right hemidiaphragm. There is no substantial pleural effusion. There is mild cardiomegaly without CHF. Remaining lungs are clear. Mild thoracic dextroscoliosis.  No new osseous findings. IMPRESSION: 1. Interval right chest tube removal. Minimal right apical pneumothorax continues to be seen, estimated 5% or less of the chest volume. 2. Bibasilar opacities could indicate atelectasis or consolidation, with elevated right hemidiaphragm. No new consolidation. 3. Mild cardiomegaly without CHF. Electronically Signed   By: Almira Bar M.D.   On: 07/27/2023 20:18   DG Chest Port 1 View Result Date: 07/27/2023 CLINICAL DATA:  Lung biopsy, interstitial lung disease. EXAM: PORTABLE CHEST 1 VIEW COMPARISON:  07/26/2023 FINDINGS: Right chest tube remains in place. Small right apical pneumothorax again noted, slightly increased in size but remains less than 5%. Heart and mediastinal contours are stable. Bibasilar opacities, favor atelectasis, unchanged. No effusions. IMPRESSION: Right chest tube with slight increased size of the small right apical pneumothorax. Bibasilar opacities, likely atelectasis. Electronically Signed   By: Charlett Nose M.D.   On: 07/27/2023 08:31   DG CHEST PORT 1 VIEW Result Date: 07/26/2023 CLINICAL DATA:  Chest tube in place. EXAM: PORTABLE CHEST 1 VIEW COMPARISON:  Radiograph earlier today FINDINGS: Right chest tube directed towards the apex. Potential tiny right apical pneumothorax. Patchy opacity at the right lung base, similar. Retrocardiac opacity is unchanged. No other interval change. Stable  heart size and mediastinal contours. IMPRESSION: 1. Right chest tube directed towards the apex. Potential tiny right apical pneumothorax. 2. Unchanged bibasilar opacities. Electronically Signed   By: Narda Rutherford M.D.   On: 07/26/2023 19:01   DG Chest Port 1 View Result Date: 07/26/2023 CLINICAL DATA:  48 year old female postoperative day zero status post Thoracoscopy with right lung biopsy. Interstitial lung disease. EXAM: PORTABLE CHEST 1 VIEW COMPARISON:  Chest radiographs 07/24/2023 and earlier. FINDINGS: Portable AP semi upright view at 0938 hours. New right chest tube coursing to the right lung apex. Lower lung volumes. No pneumothorax or pleural effusion identified. Lung base crowding of markings superimposed on asymmetric pre-existing patchy lung base opacity. Mediastinal contours are stable and within normal limits. Visualized tracheal air column is within normal limits. No acute osseous abnormality identified.  Paucity of bowel gas. IMPRESSION: Right chest tube. Lower lung volumes. No pneumothorax or pleural effusion identified. Electronically Signed   By: Odessa Fleming M.D.   On: 07/26/2023 10:23   DG Chest 2 View Result Date: 07/25/2023 CLINICAL DATA:  Interstitial lung disease. Preop chest exam. Upcoming lung biopsy. EXAM: CHEST - 2 VIEW COMPARISON:  Radiograph 04/30/2022, CT 05/27/2023 FINDINGS: The heart is normal in size. Mediastinal contours are unchanged. Mild elevation of right hemidiaphragm is unchanged from prior exam. Ill-defined opacity in a peripheral predominant distribution is without significant interval change. No pleural effusion or pneumothorax. On limited assessment, no acute osseous findings IMPRESSION: Stable radiographic appearance of the chest. Peripheral predominant ill-defined opacity which was better characterized on recent CT. Electronically Signed   By: Narda Rutherford M.D.   On: 07/25/2023 17:25    Recent Labs: Lab Results  Component Value Date   WBC 7.9 07/28/2023    HGB 10.9 (L) 07/28/2023   PLT 288 07/28/2023   NA 135 07/28/2023   K 3.3 (L) 07/28/2023   CL 101 07/28/2023   CO2 27 07/28/2023   GLUCOSE 109 (H) 07/28/2023   BUN 14 07/28/2023   CREATININE 0.77 07/28/2023   BILITOT 0.6 07/28/2023   ALKPHOS 45 07/28/2023   AST 17 07/28/2023   ALT 14 07/28/2023   PROT 6.0 (L) 07/28/2023   ALBUMIN 2.6 (L) 07/28/2023   CALCIUM 8.2 (L) 07/28/2023   GFRAA >60 02/06/2018    Speciality Comments: No specialty comments available.  Procedures:  No procedures performed Allergies: Patient has no known allergies.   Assessment / Plan:     Visit Diagnoses: UIP (usual interstitial pneumonitis) (HCC): Thoracoscopy 07/26/23--underwent a robotic assisted lung biopsy on 07/26/2023. Postoperative course was uncomplicated and she went home on postoperative day #2.  Biopsy results: Patchy interstitial fibrosing and chronic inflammatory process, most consistent with usual interstitial pneumonia (UIP)  Scheduled to follow up with Dr. Judeth Horn on 09/09/23. Canceled ENT consultation.  No longer using oxygen.  Pulse ox measured 98% saturation yesterday.  Using spirometry at home.  Discussed biopsy results with Dr. Corliss Skains today. Underlying etiology remains unclear.  Patient has history of positive ANA but rest of workup was negative including ACE being low-6.   No signs or symptoms of uveitis. No signs of inflammatory arthritis at this time  Plan to obtain the following lab work today in anticipation of an immunosuppressive therapy being initiated-we will call with results. She will be following up with Dr. Judeth Horn to further discuss results/diagnosis and future treatment plans.   She will follow up with [place] in 2 months or sooner if needed.   Positive ANA (antinuclear antibody) - 8/7/24ANA 1: 320 NH, RNP-, Smith-, dsDNA-, SCL 70 negative, C3-C4 normal, anticardiolipin negative, beta-2 GP 1 negative, sed rate 65.ACE low-6 on 02/20/23.   Other fatigue: Stable.   Hilar  lymphadenopathy - Under care of Dr. Judeth Horn.   High risk medication use - The following lab work will be updated today prior to initiating therapy.  Plan: QuantiFERON-TB Gold Plus, Serum protein electrophoresis with reflex, IgG, IgA, IgM, Thiopurine methyltransferase(tpmt)rbc, Hepatitis B core antibody, IgM, Hepatitis B surface antigen, Hepatitis C antibody, HIV Antibody (routine testing w rflx), CBC with Differential/Platelet, COMPLETE METABOLIC PANEL WITH GFR  Screening for tuberculosis - The following lab work will be updated today in case she needs to be started on an immunosuppressive agent.  Plan: QuantiFERON-TB Gold Plus, Hepatitis B core antibody, IgM, Hepatitis B surface antigen, Hepatitis C antibody, HIV Antibody (  routine testing w rflx)  Severe persistent asthma with acute exacerbation - Patient remains on Symbicort, Flonase, Afrin as needed, ProAir as needed, and Tezspire every 28 days.  Abnormal CT of the chest - 05/17/2023:Diffuse interstitial and peribronchovascular ground-glass with slight nodularity along the fissures and mild bronchiectasis Sarcoid is favored.  Lung biopsy consistent with UIP.  ACE low-6. Hypocalcemia.   No signs of inflammatory arthritis.  No signs or symptoms of uveitis.  Upcoming appointment with Dr. Judeth Horn to discuss the next steps in treatment.   Primary osteoarthritis of both knees - X-rays of both knees were updated on 02/20/2023: Right knee moderate to severe medial compartment narrowing and severe patellofemoral narrowing.  Insurance declined visco-supplementation.  Mild crepitus noted bilaterally.  Insurance declined visco-supplementation.  Inadequate response to cortisone injections in the past.   Vitamin D deficiency: She is taking vitamin D 50,000 units once weekly.   Irritable bowel syndrome with constipation   Orders: Orders Placed This Encounter  Procedures   QuantiFERON-TB Gold Plus   Serum protein electrophoresis with reflex   IgG,  IgA, IgM   Thiopurine methyltransferase(tpmt)rbc   Hepatitis B core antibody, IgM   Hepatitis B surface antigen   Hepatitis C antibody   HIV Antibody (routine testing w rflx)   CBC with Differential/Platelet   COMPLETE METABOLIC PANEL WITH GFR   No orders of the defined types were placed in this encounter.    Follow-Up Instructions: Return for Osteoarthritis, ILD.   Gearldine Bienenstock, PA-C  Note - This record has been created using Dragon software.  Chart creation errors have been sought, but may not always  have been located. Such creation errors do not reflect on  the standard of medical care.

## 2023-08-06 ENCOUNTER — Encounter: Payer: Self-pay | Admitting: Thoracic Surgery (Cardiothoracic Vascular Surgery)

## 2023-08-06 ENCOUNTER — Ambulatory Visit (INDEPENDENT_AMBULATORY_CARE_PROVIDER_SITE_OTHER): Payer: Self-pay | Admitting: Thoracic Surgery (Cardiothoracic Vascular Surgery)

## 2023-08-06 ENCOUNTER — Ambulatory Visit
Admission: RE | Admit: 2023-08-06 | Discharge: 2023-08-06 | Disposition: A | Discharge status: Home / Self Care | Payer: BC Managed Care – PPO | Source: Ambulatory Visit | Attending: Thoracic Surgery (Cardiothoracic Vascular Surgery) | Admitting: Thoracic Surgery (Cardiothoracic Vascular Surgery)

## 2023-08-06 VITALS — Ht 65.0 in

## 2023-08-06 DIAGNOSIS — J849 Interstitial pulmonary disease, unspecified: Secondary | ICD-10-CM

## 2023-08-06 DIAGNOSIS — R918 Other nonspecific abnormal finding of lung field: Secondary | ICD-10-CM | POA: Diagnosis not present

## 2023-08-06 DIAGNOSIS — Z09 Encounter for follow-up examination after completed treatment for conditions other than malignant neoplasm: Secondary | ICD-10-CM

## 2023-08-06 LAB — SURGICAL PATHOLOGY

## 2023-08-06 MED ORDER — GABAPENTIN 300 MG PO CAPS: ORAL_CAPSULE | ORAL | 2 refills | Status: DC

## 2023-08-06 NOTE — Progress Notes (Signed)
[address]       [place] [address]             [phone]      HPI: Katie Knight returns for follow-up after recent lung biopsy.  Katie Knight is a 48 year old woman with a history of asthma, hypertension, hyperlipidemia, osteoporosis, chronic dyspnea, and interstitial lung disease.  She has had worsening dyspnea with exertion.  Bronchoscopic biopsy was nondiagnostic.  CT showed progression of interstitial lung.  Bronchovascular groundglass opacities.  She underwent a robotic assisted lung biopsy on 07/26/2023.  Postoperative course was uncomplicated and she went home on postoperative day #2.  She has been having incisional pain.  Also having some burning and pins and needle sensations.  She has been taking Tylenol.  She was taking oxycodone frequently for the first week but now is only taking it once or twice a day.  Past Medical History:  Diagnosis Date   Arthritis    Asthma    High blood pressure    Hyperlipidemia    Joint pain    Osteoporosis    SOB (shortness of breath)    Vitamin D deficiency     Current Outpatient Medications  Medication Sig Dispense Refill   budesonide-formoterol (SYMBICORT) 160-4.5 MCG/ACT inhaler Inhale 2 puffs into the lungs in the morning and at bedtime. (Patient taking differently: Inhale 2 puffs into the lungs 2 (two) times daily as needed (shortness of breath).) 1 each 5   fluticasone (FLONASE) 50 MCG/ACT nasal spray Place 1 spray into both nostrils 2 (two) times daily. 16 g 2   LINZESS 145 MCG CAPS capsule Take 145 mcg by mouth daily.     Multiple Vitamins-Minerals (MULTIVITAMIN WITH MINERALS) tablet Take 1 tablet by mouth daily. gummy     oxyCODONE (OXY IR/ROXICODONE) 5 MG immediate release tablet Take 1 tablet (5 mg total) by mouth every 4 (four) hours as needed for severe pain (pain score 7-10). 30 tablet 0   oxymetazoline (AFRIN) 0.05 % nasal spray Place 1 spray into both nostrils daily as needed for congestion.      phentermine 30 MG capsule Take 30 mg by mouth daily.     PROAIR HFA 108 (90 Base) MCG/ACT inhaler Inhale 2 puffs into the lungs every 4 (four) hours as needed for wheezing or shortness of breath. 6.7 each 6   Tezepelumab-ekko (TEZSPIRE) 210 MG/1. SOAJ Inject 210 mg into the skin every 28 (twenty-eight) days. 1.91 mL 5   Vitamin D, Ergocalciferol, (DRISDOL) 1.25 MG (50000 UNIT) CAPS capsule Take 1 capsule (50,000 Units total) by mouth every 7 (seven) days. 12 capsule 0   No current facility-administered medications for this visit.    Physical Exam Ht 5\' 5"  (1.651 m)   LMP 07/10/2023 (Exact Date)   BMI 43.85 kg/m  48 year old woman in no acute distress Alert and oriented x 3 with no focal deficits Lungs diminished at bases bilaterally Incisions clean dry and intact  Diagnostic Tests: Pathology still pending  Chest x-ray shows some right basilar atelectasis  Impression: Katie Knight is a 48 year old woman with a history of asthma, hypertension, hyperlipidemia, osteoporosis, chronic dyspnea, and interstitial lung disease.   Interstitial lung disease status post lung biopsy-pathology still pending.  Treatment of her underlying condition will be to pulmonology.  Postoperative pain.  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  Will start gabapentin 300 mg p.o. nightly for 3 days and then twice daily after that if she does  not have excessive drowsiness or other side effects.  Recommended that she take 1 Tylenol and one 200 mg ibuprofen 4 times a day and then can use oxycodone for severe pain.  Advised her not to drive until her pain is significantly improved.  Plan: Return in 2 weeks to check on progress.  Loreli Slot, MD [facility] [phone]

## 2023-08-07 ENCOUNTER — Ambulatory Visit: Payer: BC Managed Care – PPO | Admitting: Family Medicine

## 2023-08-07 DIAGNOSIS — F419 Anxiety disorder, unspecified: Secondary | ICD-10-CM | POA: Diagnosis not present

## 2023-08-12 ENCOUNTER — Telehealth: Payer: Self-pay | Admitting: Pulmonary Disease

## 2023-08-12 NOTE — Telephone Encounter (Signed)
PT states Dr. Francine Graven ref her to a ENT. She states she has already had her surgery and the pathology came back. She will not need the ENT appt. She tried to cancel but they said they need [place] to cancel by phone or in writing.

## 2023-08-13 ENCOUNTER — Ambulatory Visit (INDEPENDENT_AMBULATORY_CARE_PROVIDER_SITE_OTHER): Payer: Self-pay | Admitting: Thoracic Surgery (Cardiothoracic Vascular Surgery)

## 2023-08-13 DIAGNOSIS — J849 Interstitial pulmonary disease, unspecified: Secondary | ICD-10-CM

## 2023-08-13 NOTE — Progress Notes (Signed)
[address]       [name] [address]             [phone]     HPI: Ms. [name] called regarding her path results.  Visit was conducted by telephone per the patient's request Patient location: Home MD location: Office Less than 10 minutes.  Katie Knight is a 48 year old woman who presented with a persistent cough and chronic dyspnea.  She was found to have interstitial lung disease.  Bronchoscopic biopsy was not diagnostic.  CT showed progression of disease.  She underwent a robotic assisted lung biopsy.  Pathology showed UIP.  Past Medical History:  Diagnosis Date   Arthritis    Asthma    High blood pressure    Hyperlipidemia    Joint pain    Osteoporosis    SOB (shortness of breath)    Vitamin D deficiency     Current Outpatient Medications  Medication Sig Dispense Refill   budesonide-formoterol (SYMBICORT) 160-4.5 MCG/ACT inhaler Inhale 2 puffs into the lungs in the morning and at bedtime. (Patient taking differently: Inhale 2 puffs into the lungs 2 (two) times daily as needed (shortness of breath).) 1 each 5   fluticasone (FLONASE) 50 MCG/ACT nasal spray Place 1 spray into both nostrils 2 (two) times daily. 16 g 2   gabapentin (NEURONTIN) 300 MG capsule Take 1 tablet by mouth at bedtime for 3 days. Then increase 1 tablet twice a day. 60 capsule 2   LINZESS 145 MCG CAPS capsule Take 145 mcg by mouth daily.     Multiple Vitamins-Minerals (MULTIVITAMIN WITH MINERALS) tablet Take 1 tablet by mouth daily. gummy     oxyCODONE (OXY IR/ROXICODONE) 5 MG immediate release tablet Take 1 tablet (5 mg total) by mouth every 4 (four) hours as needed for severe pain (pain score 7-10). 30 tablet 0   oxymetazoline (AFRIN) 0.05 % nasal spray Place 1 spray into both nostrils daily as needed for congestion.     phentermine 30 MG capsule Take 30 mg by mouth daily.     PROAIR HFA 108 (90 Base) MCG/ACT inhaler Inhale 2 puffs into the lungs every 4 (four) hours as  needed for wheezing or shortness of breath. 6.7 each 6   Tezepelumab-ekko (TEZSPIRE) 210 MG/1. SOAJ Inject 210 mg into the skin every 28 (twenty-eight) days. 1.91 mL 5   Vitamin D, Ergocalciferol, (DRISDOL) 1.25 MG (50000 UNIT) CAPS capsule Take 1 capsule (50,000 Units total) by mouth every 7 (seven) days. 12 capsule 0   No current facility-administered medications for this visit.    Physical Exam None  Diagnostic Tests: A. LUNG, RIGHT LOWER LOBE, WEDGE RESECTION:  - Patchy interstitial fibrosing and chronic inflammatory process, most  consistent with usual interstitial pneumonia (UIP), see comment   B. LUNG, RIGHT LOWER LOBE #2, WEDGE RESECTION:  - Patchy interstitial fibrosing and chronic inflammatory process, most  consistent with usual interstitial pneumonia (UIP), see comment   C. LUNG, RIGHT MIDDLE LOBE, WEDGE RESECTION:  - Patchy interstitial fibrosing and chronic inflammatory process, most  consistent with usual interstitial pneumonia (UIP), see comment   Impression: Katie Knight is a 48 year old woman with progressive cough and dyspnea who was found to have interstitial lung disease.  Biopsy shows UIP.  She has questions about staging, treatment, and prognosis.  I recommended she address his questions with Dr. Judeth Horn who would be the treating physician.  I do not have sufficient expertise to give her good advice.  She has a follow-up appointment with Dr. Judeth Horn in February.   Katie Slot, MD [facility] [phone]

## 2023-08-14 NOTE — Telephone Encounter (Signed)
Ok to cancel ENT if she prefers. Spoke with her via phone yesterday and she did not mention questions about ENT.

## 2023-08-14 NOTE — Telephone Encounter (Signed)
Pt is a Dr Judeth Horn pt, not Dr Francine Graven.   Pt states she needs [place] to cancel her ENT app. Pt states she already had her surgery, and she spoke with ENT yesterday and that told her they will hold her app. Until our office cancels the appointment or if we want to keep it. Please advise. Pt states she followed up with Dr Dorris Fetch. Pt states she has not had a first appointment with ENT yet and does not think she needs to se them.

## 2023-08-15 NOTE — Telephone Encounter (Signed)
LM for patient to confirm she wants to cancel ENT appt.

## 2023-08-15 NOTE — Telephone Encounter (Signed)
Called pt and told her she can call and cancel ENT appt  Nothing further needed

## 2023-08-15 NOTE — Telephone Encounter (Signed)
Atc x2. LMTCB. If pt calls back to confirm the cancellation of he ENT appointment, it is ok to send order to the ENT to have that cancelled.

## 2023-08-15 NOTE — Telephone Encounter (Signed)
Patient states that she would like to cancel her ENT appointment. She no longer feels that it is neccessary since she's gotten her results back.

## 2023-08-19 ENCOUNTER — Encounter: Payer: Self-pay | Admitting: Physician Assistant

## 2023-08-19 ENCOUNTER — Ambulatory Visit: Payer: BC Managed Care – PPO | Attending: Physician Assistant | Admitting: Physician Assistant

## 2023-08-19 VITALS — BP 139/92 | HR 88 | Resp 17 | Ht 64.0 in | Wt 266.6 lb

## 2023-08-19 DIAGNOSIS — Z1159 Encounter for screening for other viral diseases: Secondary | ICD-10-CM | POA: Diagnosis not present

## 2023-08-19 DIAGNOSIS — R5383 Other fatigue: Secondary | ICD-10-CM

## 2023-08-19 DIAGNOSIS — R768 Other specified abnormal immunological findings in serum: Secondary | ICD-10-CM

## 2023-08-19 DIAGNOSIS — J4551 Severe persistent asthma with (acute) exacerbation: Secondary | ICD-10-CM

## 2023-08-19 DIAGNOSIS — E559 Vitamin D deficiency, unspecified: Secondary | ICD-10-CM

## 2023-08-19 DIAGNOSIS — M170 Bilateral primary osteoarthritis of knee: Secondary | ICD-10-CM

## 2023-08-19 DIAGNOSIS — R053 Chronic cough: Secondary | ICD-10-CM

## 2023-08-19 DIAGNOSIS — R590 Localized enlarged lymph nodes: Secondary | ICD-10-CM | POA: Diagnosis not present

## 2023-08-19 DIAGNOSIS — Z111 Encounter for screening for respiratory tuberculosis: Secondary | ICD-10-CM

## 2023-08-19 DIAGNOSIS — K581 Irritable bowel syndrome with constipation: Secondary | ICD-10-CM

## 2023-08-19 DIAGNOSIS — Z79899 Other long term (current) drug therapy: Secondary | ICD-10-CM

## 2023-08-19 DIAGNOSIS — J84112 Idiopathic pulmonary fibrosis: Secondary | ICD-10-CM

## 2023-08-19 DIAGNOSIS — R9389 Abnormal findings on diagnostic imaging of other specified body structures: Secondary | ICD-10-CM

## 2023-08-21 DIAGNOSIS — F419 Anxiety disorder, unspecified: Secondary | ICD-10-CM | POA: Diagnosis not present

## 2023-08-26 LAB — FUNGUS CULTURE WITH STAIN

## 2023-08-26 LAB — FUNGUS CULTURE RESULT

## 2023-08-26 LAB — FUNGAL ORGANISM REFLEX

## 2023-08-27 ENCOUNTER — Encounter: Payer: Self-pay | Admitting: Thoracic Surgery (Cardiothoracic Vascular Surgery)

## 2023-08-27 ENCOUNTER — Ambulatory Visit (INDEPENDENT_AMBULATORY_CARE_PROVIDER_SITE_OTHER): Payer: Self-pay | Admitting: Thoracic Surgery (Cardiothoracic Vascular Surgery)

## 2023-08-27 VITALS — BP 149/88 | HR 90 | Resp 20 | Wt 275.2 lb

## 2023-08-27 DIAGNOSIS — J849 Interstitial pulmonary disease, unspecified: Secondary | ICD-10-CM

## 2023-08-27 LAB — CBC WITH DIFFERENTIAL/PLATELET
Absolute Lymphocytes: 1079 {cells}/uL (ref 850–3900)
Absolute Monocytes: 395 {cells}/uL (ref 200–950)
Basophils Absolute: 21 {cells}/uL (ref 0–200)
Basophils Relative: 0.5 %
Eosinophils Absolute: 109 {cells}/uL (ref 15–500)
Eosinophils Relative: 2.6 %
HCT: 36.7 % (ref 35.0–45.0)
Hemoglobin: 11.8 g/dL (ref 11.7–15.5)
MCH: 29.7 pg (ref 27.0–33.0)
MCHC: 32.2 g/dL (ref 32.0–36.0)
MCV: 92.4 fL (ref 80.0–100.0)
MPV: 8 fL (ref 7.5–12.5)
Monocytes Relative: 9.4 %
Neutro Abs: 2596 {cells}/uL (ref 1500–7800)
Neutrophils Relative %: 61.8 %
Platelets: 434 10*3/uL — ABNORMAL HIGH (ref 140–400)
RBC: 3.97 10*6/uL (ref 3.80–5.10)
RDW: 12 % (ref 11.0–15.0)
Total Lymphocyte: 25.7 %
WBC: 4.2 10*3/uL (ref 3.8–10.8)

## 2023-08-27 LAB — COMPLETE METABOLIC PANEL WITH GFR
AG Ratio: 1.2 (calc) (ref 1.0–2.5)
ALT: 15 U/L (ref 6–29)
AST: 16 U/L (ref 10–35)
Albumin: 4.1 g/dL (ref 3.6–5.1)
Alkaline phosphatase (APISO): 74 U/L (ref 31–125)
BUN: 17 mg/dL (ref 7–25)
CO2: 26 mmol/L (ref 20–32)
Calcium: 9.4 mg/dL (ref 8.6–10.2)
Chloride: 101 mmol/L (ref 98–110)
Creat: 0.74 mg/dL (ref 0.50–0.99)
Globulin: 3.4 g/dL (ref 1.9–3.7)
Glucose, Bld: 81 mg/dL (ref 65–99)
Potassium: 4.6 mmol/L (ref 3.5–5.3)
Sodium: 139 mmol/L (ref 135–146)
Total Bilirubin: 0.6 mg/dL (ref 0.2–1.2)
Total Protein: 7.5 g/dL (ref 6.1–8.1)
eGFR: 102 mL/min/{1.73_m2} (ref >=60)

## 2023-08-27 LAB — HEPATITIS C ANTIBODY: Hepatitis C Ab: NONREACTIVE

## 2023-08-27 LAB — PROTEIN ELECTROPHORESIS, SERUM, WITH REFLEX
Albumin ELP: 4 g/dL (ref 3.8–4.8)
Alpha 1: 0.3 g/dL (ref 0.2–0.3)
Alpha 2: 0.8 g/dL (ref 0.5–0.9)
Beta 2: 0.6 g/dL — ABNORMAL HIGH (ref 0.2–0.5)
Beta Globulin: 0.4 g/dL (ref 0.4–0.6)
Gamma Globulin: 1.5 g/dL (ref 0.8–1.7)
Total Protein: 7.7 g/dL (ref 6.1–8.1)

## 2023-08-27 LAB — IFE INTERPRETATION

## 2023-08-27 LAB — QUANTIFERON-TB GOLD PLUS
Mitogen-NIL: >10 [IU]/mL
NIL: 0.02 [IU]/mL
QuantiFERON-TB Gold Plus: NEGATIVE
TB1-NIL: 0 [IU]/mL
TB2-NIL: 0 [IU]/mL

## 2023-08-27 LAB — HEPATITIS B CORE ANTIBODY, IGM: Hep B C IgM: NONREACTIVE

## 2023-08-27 LAB — THIOPURINE METHYLTRANSFERASE (TPMT), RBC: Thiopurine Methyltransferase, RBC: 14 nmol/h/mL

## 2023-08-27 LAB — IGG, IGA, IGM
IgG (Immunoglobin G), Serum: 1607 mg/dL (ref 600–1640)
IgM, Serum: 210 mg/dL (ref 50–300)
Immunoglobulin A: 419 mg/dL — ABNORMAL HIGH (ref 47–310)

## 2023-08-27 LAB — HEPATITIS B SURFACE ANTIGEN: Hepatitis B Surface Ag: NONREACTIVE

## 2023-08-27 LAB — HIV ANTIBODY (ROUTINE TESTING W REFLEX): HIV 1&2 Ab, 4th Generation: NONREACTIVE

## 2023-08-27 MED ORDER — OXYCODONE HCL 5 MG PO TABS: 5.0000 mg | ORAL_TABLET | Freq: Four times a day (QID) | ORAL | 0 refills | Status: DC | PRN

## 2023-08-27 NOTE — Progress Notes (Signed)
IFE did not reveal any abnormal proteins.  TPMT WNL

## 2023-08-27 NOTE — Progress Notes (Signed)
[address]       [name] [address]             [phone]     HPI: [name] returns for a scheduled follow-up after lung biopsy  Katie Knight is a 48 year old woman with interstitial lung disease.  She underwent a robotic assisted lung biopsy on 07/26/2023.  Postoperative course was uncomplicated and she went home on day 2.  Biopsy showed UIP.  Has not been taking any oxycodone.  She is out of those.  Has been using Tylenol.  Pain well-controlled for the most part.  She does have some paresthesias and is using gabapentin.  Her pain had improved significantly, but over the last few days she has been having a little more pain.  She is out of oxycodone.  Past Medical History:  Diagnosis Date   Arthritis    Asthma    High blood pressure    Hyperlipidemia    Joint pain    Osteoporosis    SOB (shortness of breath)    Vitamin D deficiency      Current Outpatient Medications  Medication Sig Dispense Refill   budesonide-formoterol (SYMBICORT) 160-4.5 MCG/ACT inhaler Inhale 2 puffs into the lungs in the morning and at bedtime. (Patient taking differently: Inhale 2 puffs into the lungs 2 (two) times daily as needed (shortness of breath).) 1 each 5   fluticasone (FLONASE) 50 MCG/ACT nasal spray Place 1 spray into both nostrils 2 (two) times daily. (Patient taking differently: Place 1 spray into both nostrils as needed.) 16 g 2   gabapentin (NEURONTIN) 300 MG capsule Take 1 tablet by mouth at bedtime for 3 days. Then increase 1 tablet twice a day. 60 capsule 2   LINZESS 145 MCG CAPS capsule Take 145 mcg by mouth daily.     Multiple Vitamins-Minerals (MULTIVITAMIN WITH MINERALS) tablet Take 1 tablet by mouth daily. gummy     oxymetazoline (AFRIN) 0.05 % nasal spray Place 1 spray into both nostrils daily as needed for congestion.     phentermine 30 MG capsule Take 30 mg by mouth daily.     PROAIR HFA 108 (90 Base) MCG/ACT inhaler Inhale 2 puffs into the lungs  every 4 (four) hours as needed for wheezing or shortness of breath. 6.7 each 6   Tezepelumab-ekko (TEZSPIRE) 210 MG/1. SOAJ Inject 210 mg into the skin every 28 (twenty-eight) days. 1.91 mL 5   Vitamin D, Ergocalciferol, (DRISDOL) 1.25 MG (50000 UNIT) CAPS capsule Take 1 capsule (50,000 Units total) by mouth every 7 (seven) days. 12 capsule 0   oxyCODONE (OXY IR/ROXICODONE) 5 MG immediate release tablet Take 1 tablet (5 mg total) by mouth every 6 (six) hours as needed for severe pain (pain score 7-10). 15 tablet 0   No current facility-administered medications for this visit.    Physical Exam BP (!) 149/88 (BP Location: Right Arm, Patient Position: Sitting, Cuff Size: Large)   Pulse 90   Resp 20   Wt 275 lb 3.2 oz (124.8 kg)   SpO2 98% Comment: RA  BMI 47.24 kg/m  Obese 48 year old woman in no acute distress Alert and oriented x 3 Lungs clear with equal breath sounds bilaterally Incisions well-healed  Diagnostic Tests: None  Impression: Katie Knight is a 48 year old woman with interstitial lung disease.  She underwent robotic assisted lung biopsy on 07/26/2023.  ILD-pathology showed UIP.  She has an appointment with Dr. Judeth Horn in about 2 weeks.  Respiratory status stable.  Status post lung biopsy-she does have some pain and paresthesias.  Has been taking Tylenol for the pain.  I gave her prescription for oxycodone 5 mg p.o. every 6 hours as needed, 15 tablets, no refills for times when her pain may be worse.  She does have some paresthesias.  She is on gabapentin and should remain on that for now.  Tentatively planning to go back to work in about a month.  Plan: Follow-up with Dr. Judeth Horn as planned She will call if she has any issues related to her surgery.  Loreli Slot, MD [facility] [phone]

## 2023-08-28 DIAGNOSIS — J849 Interstitial pulmonary disease, unspecified: Secondary | ICD-10-CM | POA: Diagnosis not present

## 2023-08-28 DIAGNOSIS — R0602 Shortness of breath: Secondary | ICD-10-CM | POA: Diagnosis not present

## 2023-09-07 LAB — ACID FAST CULTURE WITH REFLEXED SENSITIVITIES (MYCOBACTERIA): Acid Fast Culture: NEGATIVE

## 2023-09-09 ENCOUNTER — Ambulatory Visit: Payer: BC Managed Care – PPO | Admitting: Pulmonary Disease

## 2023-09-09 ENCOUNTER — Encounter: Payer: Self-pay | Admitting: Pulmonary Disease

## 2023-09-09 VITALS — BP 138/86 | HR 92 | Ht 65.0 in | Wt 275.2 lb

## 2023-09-09 DIAGNOSIS — J849 Interstitial pulmonary disease, unspecified: Secondary | ICD-10-CM | POA: Diagnosis not present

## 2023-09-09 LAB — C-REACTIVE PROTEIN: CRP: <1 mg/dL (ref 0.5–20.0)

## 2023-09-09 LAB — SEDIMENTATION RATE: Sed Rate: 56 mm/h — ABNORMAL HIGH (ref 0–20)

## 2023-09-09 NOTE — Progress Notes (Signed)
 @Patient  ID: Katie Knight, female    DOB: 09-14-1975, 48 y.o.   MRN: [id number]  Chief Complaint  Patient presents with   Follow-up    Pt says asthma is hard on her since having surgery, sob during exertion w/ little wheezing & coughing. O2 2L continuous flow as needed.     Referring provider: [name], MD  HPI:   48 y.o. woman whom we are seeing in follow up for evaluation of cough, dyspnea on exertion, asthma.  Discharge summary from hospital lung biopsy reviewed.  Most recent cardiothoracic surgery note reviewed.  Rheumatology note reviewed.  Remains out of work.  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  Shortness of breath may begin a bit better with getting back on her feet at [facility].  She has been pretty active it sounds like.  Hard to tell if dyspnea any better or worse.  Continues on Dahl[place], Dorothea Ogle for underlying asthma.  Discussed case at length with colleagues.  Plan to repeat blood work.  I think a second opinion would be helpful.  Discussed at length with patient.  Given her age, race, sex, the finding of UIP on biopsy was a bit surprising.  Unexpected.  Have discussed pathology review at [facility].  I am hesitant to do this in case this is lost in transit.  I think it is better for her to get second opinion locally from pulmonologist.  And if a second opinion on pathology is needed we can provide the pathology from here.  Discussed referral to [facility].  She and partner expressed understanding.  Briefly discussed possible medications to be used in the future as a relates to ILD in general in the UIP.  We discussed that fortunately have time with stable CT findings.  Stable oxygenation.  Stable symptoms.  HPI at initial visit: Noted shortness of breath for the last several months.  With exertion.  Worse on inclines or stairs.  Not much history at rest.  No time of day when things are better or worse.  No position make things better or worse.  Recent prescribed albuterol with  mild improvement in symptoms.  No other alleviating or exacerbating factors.  Associated symptoms of cough.  Seems worse in the morning.  Mildly productive.  Not much of an issue today.  Endorses nasal congestion, postnasal drip.  Denies reflux.  Work-up today includes EKG treadmill stress test that was normal 12/2021.  Most recent chest image seen/x-ray 12/27/2021 personally reviewed and interpreted as tenting of the right diaphragm with linear infiltrates most consistent with atelectasis given evidence of volume loss, reviewed reported concern for pneumonia.  Reviewed TTE 2019 that is largely normal.  Reviewed CT chest 02/2018 revealed scattered diffuse peribronchovascular groundglass nodules on my interpretation.  PMH: Asthma Surgical history: D&C Family history: Father with hypertension Social history: Never smoker, lives in [facility]  Questionaires / Pulmonary Flowsheets:   ACT:  Asthma Control Test ACT Total Score  03/26/2022  9:55 AM 15  01/19/2022  3:41 PM 18    MMRC:     No data to display          Epworth:      No data to display          Tests:   FENO:  No results found for: "NITRICOXIDE"  PFT:    Latest Ref Rng & Units 10/19/2022   11:38 AM  PFT Results  FVC-Pre L 2.72   FVC-Predicted Pre % 69   FVC-Post L 2.55  FVC-Predicted Post % 65   Pre FEV1/FVC % % 80   Post FEV1/FCV % % 86   FEV1-Pre L 2.17   FEV1-Predicted Pre % 69   FEV1-Post L 2.19   DLCO uncorrected ml/min/mmHg 17.48   DLCO UNC% % 75   DLCO corrected ml/min/mmHg 17.48   DLCO COR %Predicted % 75   DLVA Predicted % 103   TLC L 4.01   TLC % Predicted % 74   RV % Predicted % 74     WALK:      No data to display          Imaging: Personally reviewed and as per EMR discussion this note No results found.  Lab Results: Personally reviewed CBC    Component Value Date/Time   WBC 4.2 08/19/2023 1015   RBC 3.97 08/19/2023 1015   HGB 11.8 08/19/2023 1015   HCT  36.7 08/19/2023 1015   PLT 434 (H) 08/19/2023 1015   MCV 92.4 08/19/2023 1015   MCH 29.7 08/19/2023 1015   MCHC 32.2 08/19/2023 1015   RDW 12.0 08/19/2023 1015   LYMPHSABS 1,449 02/20/2023 1119   MONOABS 0.5 04/14/2022 1607   EOSABS 109 08/19/2023 1015   BASOSABS 21 08/19/2023 1015    BMET    Component Value Date/Time   NA 139 08/19/2023 1015   NA 137 05/07/2023 0845   K 4.6 08/19/2023 1015   CL 101 08/19/2023 1015   CO2 26 08/19/2023 1015   GLUCOSE 81 08/19/2023 1015   BUN 17 08/19/2023 1015   BUN 15 05/07/2023 0845   CREATININE 0.74 08/19/2023 1015   CALCIUM 9.4 08/19/2023 1015   GFRNONAA >60 07/28/2023 0208   GFRAA >60 02/06/2018 2019    BNP No results found for: "BNP"  ProBNP No results found for: "PROBNP"  Specialty Problems       Pulmonary Problems   Asthma   Chronic cough   Snoring   SOBOE (shortness of breath on exertion)   ILD (interstitial lung disease) (HCC)     No Known Allergies   There is no immunization history on file for this patient.  Past Medical History:  Diagnosis Date   Arthritis    Asthma    High blood pressure    Hyperlipidemia    Joint pain    Osteoporosis    SOB (shortness of breath)    Vitamin D deficiency     Tobacco History: Social History   Tobacco Use  Smoking Status Never   Passive exposure: Past  Smokeless Tobacco Never   Counseling given: Not Answered   Continue to not smoke  Outpatient Encounter Medications as of 09/09/2023  Medication Sig   budesonide-formoterol (SYMBICORT) 160-4.5 MCG/ACT inhaler Inhale 2 puffs into the lungs in the morning and at bedtime. (Patient taking differently: Inhale 2 puffs into the lungs 2 (two) times daily as needed (shortness of breath).)   fluticasone (FLONASE) 50 MCG/ACT nasal spray Place 1 spray into both nostrils 2 (two) times daily. (Patient taking differently: Place 1 spray into both nostrils as needed.)   gabapentin (NEURONTIN) 300 MG capsule Take 1 tablet by mouth  at bedtime for 3 days. Then increase 1 tablet twice a day.   LINZESS 145 MCG CAPS capsule Take 145 mcg by mouth daily.   Multiple Vitamins-Minerals (MULTIVITAMIN WITH MINERALS) tablet Take 1 tablet by mouth daily. gummy   oxyCODONE (OXY IR/ROXICODONE) 5 MG immediate release tablet Take 1 tablet (5 mg total) by mouth every 6 (six) hours as  needed for severe pain (pain score 7-10). (Patient taking differently: Take 5 mg by mouth every 6 (six) hours as needed for severe pain (pain score 7-10). As needed)   oxymetazoline (AFRIN) 0.05 % nasal spray Place 1 spray into both nostrils daily as needed for congestion.   phentermine 30 MG capsule Take 30 mg by mouth daily.   PROAIR HFA 108 (90 Base) MCG/ACT inhaler Inhale 2 puffs into the lungs every 4 (four) hours as needed for wheezing or shortness of breath.   Tezepelumab-ekko (TEZSPIRE) 210 MG/1. SOAJ Inject 210 mg into the skin every 28 (twenty-eight) days.   Vitamin D, Ergocalciferol, (DRISDOL) 1.25 MG (50000 UNIT) CAPS capsule Take 1 capsule (50,000 Units total) by mouth every 7 (seven) days.   No facility-administered encounter medications on file as of 09/09/2023.     Review of Systems  Review of Systems  N/a Physical Exam  BP 138/86 (BP Location: Right Arm, Patient Position: Sitting, Cuff Size: Large)   Pulse 92   Ht 5\' 5"  (1.651 m)   Wt 275 lb 3.2 oz (124.8 kg)   SpO2 99%   BMI 45.80 kg/m   Wt Readings from Last 5 Encounters:  09/09/23 275 lb 3.2 oz (124.8 kg)  08/27/23 275 lb 3.2 oz (124.8 kg)  08/19/23 266 lb 9.6 oz (120.9 kg)  07/26/23 260 lb (117.9 kg)  07/24/23 (P) 266 lb 1.6 oz (120.7 kg)    BMI Readings from Last 5 Encounters:  09/09/23 45.80 kg/m  08/27/23 47.24 kg/m  08/19/23 45.76 kg/m  08/06/23 43.27 kg/m  07/26/23 43.27 kg/m     Physical Exam General: Well-appearing, no acute distress Eyes: EOMI, no icterus Neck: Supple, no JVP Pulmonary: Clear, no cough with inhalation (improvement from prior),  normal work of breathing Cardiovascular: Warm, no edema Abdomen: Nondistended, bowel sounds present MSK: Normal joints, no effusion, no synovitis  Neuro: Normal gait, no weakness Psych: Normal mood, full affect   Assessment & Plan:   Dyspnea on exertion: Related to poorly controlled asthma and ILD on surgical lung biopsy.  Initially improved with ICS/LABA therapy.  Other considerations include deconditioning.  Now with persistent endobronchial nodules and mild lymphadenopathy possible sarcoidosis as cause versus history of worsening symptoms with inhalational exposure at work concern for HSP.  Bronchoscopy with EBUS 04/2022 without any evidence of sarcoidosis on biopsy nor suggestive on cell count.  PFT 10/2022 with mild restriction and mild reduced DLCO.  Repeat CT scan 05/2023 compared to 03/2022 unchanged.  Surgical lung biopsy pursued after that with diagnosis of UIP on pathology.  See below.  Cough suspect related to poorly controlled asthma due to occupational exposure with exacerbation: Historically improved with time off from work and seems worse at work with heat humidity and dust exposure.  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  With some interval improvement with ICS/LABA.Marland Kitchen  Also with postnasal drip likely contributing.  Unfortunate despite high-dose ICS/LABA therapy with mild improvement initially symptoms have worsened.  Did not respond well to aggressive intranasal regimen.   Some improvement with steroids in past. Started Tezspire 12/2022 for uncontrolled cough variant asthma. Continue ICS/LABA via Symbicort.  Overall it seems like symptoms related to work exposure or at least worsening with work exposure.  Symptoms a bit improved with aggressive asthma therapies.  Continue ICS/LABA and Tezspire.  Referral to ENT at [facility] for evaluation of cough at voice center, she has deferred evaluation to date..  Initial lung disease, UIP on pathology: Scattered endobronchial nodules,  groundglass.interlobular septal thickening, no frank fibrosis to my eye.  Possible sarcoidosis with some lymphadenopathy.  I also worry about HSP given work exposure worsening with exposure at work inhalations etc.  Cough has not responded well to inhaled therapies and intranasal therapies.  Size of lymph nodes had shrunk at time of EBUS favoring more reactive lymphadenopathy.  No evidence of sarcoidosis on bronchoscopy studies.  Repeat high-resolution CT scan 05/2023 unchanged 03/2022.  This prompted surgical lung biopsy with results of UIP on pathology.  Unexpected given her age, sex, race.  Repeat serologic workup and add aldolase, myositis panel.  Serologic workup negative to date with the exception of mild to moderately positive ANA.  Will refer to [facility] for second opinion, treatment options.   Return in about 2 months (around 11/07/2023) for f/u Dr. Judeth Horn.   Karren Burly, MD 09/09/2023  I spent 42 minutes in the care of patient including review of records, face-to-face visit, coordination of care.

## 2023-09-09 NOTE — Patient Instructions (Signed)
 Nice to see you again  No changes in medication  More labs today, sorry.  These are different than the ones rheumatology drew recently.  I will send a communication to your rheumatology team as well.  I am going to send a referral to [facility] for second opinion.  Marking this urgent hopefully we can get this scheduled for you soon.  Return to clinic in 2 months or sooner if needed with Dr. Judeth Horn

## 2023-09-14 LAB — HYPERSENSITIVITY PNEUMONITIS
A. Pullulans Abs: NEGATIVE
A.Fumigatus #1 Abs: NEGATIVE
Micropolyspora faeni, IgG: NEGATIVE
Pigeon Serum Abs: NEGATIVE
Thermoact. Saccharii: NEGATIVE
Thermoactinomyces vulgaris, IgG: NEGATIVE

## 2023-09-14 LAB — MYOSITIS SPECIFIC II ANTIBODIES PANEL
EJ AB: <11 SI (ref <11)
JO-1 AB: <11 SI (ref <11)
MDA-5 AB: <11 SI (ref <11)
MI-2 ALPHA AB: <11 SI (ref <11)
MI-2 BETA AB: <11 SI (ref <11)
NXP-2 AB: <11 SI (ref <11)
OJ AB: <11 SI (ref <11)
PL-12 AB: <11 SI (ref <11)
PL-7 AB: <11 SI (ref <11)
SRP-AB: <11 SI (ref <11)
TIF-1y AB: <11 SI (ref <11)

## 2023-09-14 LAB — FANA STAINING PATTERNS
Homogeneous Pattern: 1:160 {titer} — ABNORMAL HIGH
Speckled Pattern: 1:160 {titer} — ABNORMAL HIGH

## 2023-09-14 LAB — ANA+ENA+DNA/DS+SCL 70+SJOSSA/B
ANA Titer 1: POSITIVE — AB
ENA RNP Ab: <0.2 AI (ref 0.0–0.9)
ENA SM Ab Ser-aCnc: <0.2 AI (ref 0.0–0.9)
ENA SSA (RO) Ab: <0.2 AI (ref 0.0–0.9)
ENA SSB (LA) Ab: 0.2 AI (ref 0.0–0.9)
Scleroderma (Scl-70) (ENA) Antibody, IgG: <0.2 AI (ref 0.0–0.9)
dsDNA Ab: <1 [IU]/mL (ref 0–9)

## 2023-09-14 LAB — ALDOLASE: Aldolase: 3.9 U/L (ref <=8.1)

## 2023-09-18 ENCOUNTER — Telehealth: Payer: Self-pay | Admitting: Pulmonary Disease

## 2023-09-18 NOTE — Telephone Encounter (Signed)
 Pt calling because her workplace should have fax'd over a "Medical Update" form to Dr. Judeth Horn. Have we rec'd and replied to it? Please call her @ [phone]

## 2023-09-19 ENCOUNTER — Telehealth: Payer: Self-pay

## 2023-09-19 ENCOUNTER — Encounter: Payer: Self-pay | Admitting: Pulmonary Disease

## 2023-09-19 NOTE — Telephone Encounter (Signed)
 PCCs - can someone follow up with [facility] on referral being marked URGENT.  Thank you!

## 2023-09-19 NOTE — Telephone Encounter (Signed)
 This was never placed in my box. Katie Knight found it in the media and printed it out for me. I completed this afternoon. We will work on getting it faxed back.

## 2023-09-19 NOTE — Telephone Encounter (Signed)
 Dr Judeth Horn, did you receive anything for this pt?

## 2023-09-23 NOTE — Telephone Encounter (Signed)
 spoke with PT her short term disability paperwork has been faxed  Outgoing call

## 2023-09-25 ENCOUNTER — Other Ambulatory Visit: Payer: Self-pay | Admitting: Family Medicine

## 2023-09-25 DIAGNOSIS — E559 Vitamin D deficiency, unspecified: Secondary | ICD-10-CM

## 2023-09-25 DIAGNOSIS — R0602 Shortness of breath: Secondary | ICD-10-CM | POA: Diagnosis not present

## 2023-09-25 DIAGNOSIS — J849 Interstitial pulmonary disease, unspecified: Secondary | ICD-10-CM | POA: Diagnosis not present

## 2023-09-27 ENCOUNTER — Telehealth: Payer: Self-pay | Admitting: Pharmacist

## 2023-09-27 DIAGNOSIS — J84111 Idiopathic interstitial pneumonia, not otherwise specified: Secondary | ICD-10-CM | POA: Diagnosis not present

## 2023-09-27 NOTE — Telephone Encounter (Signed)
 Received fax from [employer] that patient's enrollment into [employer] Program expired on 09/23/2023.  Please re-attempt Tezspire PA through insurance  Chesley Mires, PharmD, MPH, BCPS, CPP Clinical Pharmacist (Rheumatology and Pulmonology)

## 2023-10-08 NOTE — Telephone Encounter (Signed)
 [employer] does not handle auths. Must go through [employer]. [employer] PA form completed and faxed to [employer]: [phone] Phone: [phone]

## 2023-10-08 NOTE — Telephone Encounter (Signed)
 Submitted a Prior Authorization request to [facility] for TEZSPIRE via CoverMyMeds. Will update once we receive a response.  Key: [id number]

## 2023-10-10 NOTE — Telephone Encounter (Signed)
 NFN

## 2023-10-26 DIAGNOSIS — J849 Interstitial pulmonary disease, unspecified: Secondary | ICD-10-CM | POA: Diagnosis not present

## 2023-10-26 DIAGNOSIS — R0602 Shortness of breath: Secondary | ICD-10-CM | POA: Diagnosis not present

## 2023-11-01 DIAGNOSIS — J849 Interstitial pulmonary disease, unspecified: Secondary | ICD-10-CM | POA: Diagnosis not present

## 2023-11-01 DIAGNOSIS — Z79899 Other long term (current) drug therapy: Secondary | ICD-10-CM | POA: Diagnosis not present

## 2023-11-05 DIAGNOSIS — F419 Anxiety disorder, unspecified: Secondary | ICD-10-CM | POA: Diagnosis not present

## 2023-11-06 DIAGNOSIS — Z1231 Encounter for screening mammogram for malignant neoplasm of breast: Secondary | ICD-10-CM | POA: Diagnosis not present

## 2023-11-11 ENCOUNTER — Ambulatory Visit: Payer: BC Managed Care – PPO | Admitting: Physician Assistant

## 2023-11-15 DIAGNOSIS — J849 Interstitial pulmonary disease, unspecified: Secondary | ICD-10-CM | POA: Diagnosis not present

## 2023-11-15 DIAGNOSIS — Z79899 Other long term (current) drug therapy: Secondary | ICD-10-CM | POA: Diagnosis not present

## 2023-11-22 ENCOUNTER — Encounter (HOSPITAL_COMMUNITY): Payer: Self-pay | Admitting: Emergency Medicine

## 2023-11-22 ENCOUNTER — Emergency Department (HOSPITAL_COMMUNITY)
Admission: EM | Admit: 2023-11-22 | Discharge: 2023-11-22 | Disposition: A | Discharge status: Home / Self Care | Attending: Emergency Medicine | Admitting: Emergency Medicine

## 2023-11-22 ENCOUNTER — Other Ambulatory Visit: Payer: Self-pay

## 2023-11-22 ENCOUNTER — Emergency Department (HOSPITAL_COMMUNITY)

## 2023-11-22 DIAGNOSIS — R109 Unspecified abdominal pain: Secondary | ICD-10-CM | POA: Diagnosis present

## 2023-11-22 DIAGNOSIS — N132 Hydronephrosis with renal and ureteral calculous obstruction: Secondary | ICD-10-CM | POA: Diagnosis not present

## 2023-11-22 DIAGNOSIS — N23 Unspecified renal colic: Secondary | ICD-10-CM | POA: Insufficient documentation

## 2023-11-22 LAB — BASIC METABOLIC PANEL WITH GFR
Anion gap: 9 (ref 5–15)
BUN: 20 mg/dL (ref 6–20)
CO2: 24 mmol/L (ref 22–32)
Calcium: 8.6 mg/dL — ABNORMAL LOW (ref 8.9–10.3)
Chloride: 104 mmol/L (ref 98–111)
Creatinine, Ser: 0.92 mg/dL (ref 0.44–1.00)
GFR, Estimated: >60 mL/min (ref >60)
Glucose, Bld: 137 mg/dL — ABNORMAL HIGH (ref 70–99)
Potassium: 3.5 mmol/L (ref 3.5–5.1)
Sodium: 137 mmol/L (ref 135–145)

## 2023-11-22 LAB — URINALYSIS, ROUTINE W REFLEX MICROSCOPIC
Bilirubin Urine: NEGATIVE
Glucose, UA: NEGATIVE mg/dL
Hgb urine dipstick: NEGATIVE
Ketones, ur: NEGATIVE mg/dL
Leukocytes,Ua: NEGATIVE
Nitrite: NEGATIVE
Protein, ur: NEGATIVE mg/dL
Specific Gravity, Urine: 1.02 (ref 1.005–1.030)
pH: 5 (ref 5.0–8.0)

## 2023-11-22 LAB — CBC
HCT: 37.4 % (ref 36.0–46.0)
Hemoglobin: 11.9 g/dL — ABNORMAL LOW (ref 12.0–15.0)
MCH: 29.5 pg (ref 26.0–34.0)
MCHC: 31.8 g/dL (ref 30.0–36.0)
MCV: 92.6 fL (ref 80.0–100.0)
Platelets: 394 10*3/uL (ref 150–400)
RBC: 4.04 MIL/uL (ref 3.87–5.11)
RDW: 12.7 % (ref 11.5–15.5)
WBC: 6.1 10*3/uL (ref 4.0–10.5)
nRBC: 0 % (ref 0.0–0.2)

## 2023-11-22 LAB — HEPATIC FUNCTION PANEL
ALT: 9 U/L (ref 0–44)
AST: 17 U/L (ref 15–41)
Albumin: 3.6 g/dL (ref 3.5–5.0)
Alkaline Phosphatase: 69 U/L (ref 38–126)
Bilirubin, Direct: <0.1 mg/dL (ref 0.0–0.2)
Total Bilirubin: 0.7 mg/dL (ref 0.0–1.2)
Total Protein: 7.4 g/dL (ref 6.5–8.1)

## 2023-11-22 LAB — HCG, SERUM, QUALITATIVE: Preg, Serum: NEGATIVE

## 2023-11-22 LAB — LIPASE, BLOOD: Lipase: 30 U/L (ref 11–51)

## 2023-11-22 MED ORDER — TAMSULOSIN HCL 0.4 MG PO CAPS: 0.4000 mg | ORAL_CAPSULE | Freq: Every day | ORAL | 0 refills | Status: AC

## 2023-11-22 MED ORDER — OXYCODONE-ACETAMINOPHEN 5-325 MG PO TABS: 1.0000 | ORAL_TABLET | Freq: Four times a day (QID) | ORAL | 0 refills | Status: DC | PRN

## 2023-11-22 MED ORDER — SODIUM CHLORIDE 0.9 % IV BOLUS
1000.0000 mL | Freq: Once | INTRAVENOUS | Status: AC
  Administered 2023-11-22: 1000 mL via INTRAVENOUS

## 2023-11-22 MED ORDER — MORPHINE SULFATE (PF) 4 MG/ML IV SOLN
4.0000 mg | Freq: Once | INTRAVENOUS | Status: AC
  Administered 2023-11-22: 4 mg via INTRAVENOUS
  Filled 2023-11-22: qty 1

## 2023-11-22 MED ORDER — ONDANSETRON HCL 4 MG/2ML IJ SOLN
4.0000 mg | Freq: Once | INTRAMUSCULAR | Status: AC
  Administered 2023-11-22: 4 mg via INTRAVENOUS
  Filled 2023-11-22: qty 2

## 2023-11-22 MED ORDER — ONDANSETRON HCL 4 MG PO TABS: 4.0000 mg | ORAL_TABLET | Freq: Four times a day (QID) | ORAL | 0 refills | Status: DC

## 2023-11-22 MED ORDER — OXYCODONE-ACETAMINOPHEN 5-325 MG PO TABS
1.0000 | ORAL_TABLET | ORAL | Status: DC | PRN
  Administered 2023-11-22: 1 via ORAL
  Filled 2023-11-22: qty 1

## 2023-11-22 MED ORDER — KETOROLAC TROMETHAMINE 30 MG/ML IJ SOLN
15.0000 mg | Freq: Once | INTRAMUSCULAR | Status: AC
  Administered 2023-11-22: 15 mg via INTRAVENOUS
  Filled 2023-11-22: qty 1

## 2023-11-22 NOTE — ED Notes (Signed)
 Pt ambulated independently to restroom

## 2023-11-22 NOTE — ED Provider Notes (Signed)
 [place] [facility] Provider Note   CSN: [id number] Arrival date & time: 11/22/23  4098     History  Chief Complaint  Patient presents with   Flank Pain   Nausea    Katie Knight is a 48 y.o. female.  48 year old female with prior medical history as detailed below presents for evaluation.  Patient reports right-sided flank pain that began acutely around midnight last night.  Patient reports the pain started in the right back and then progressed to the right lower quadrant of her abdomen.  She denies prior history of renal colic.  She reports associated nausea.  She denies fever.  The history is provided by the patient.       Home Medications Prior to Admission medications   Medication Sig Start Date End Date Taking? Authorizing Provider  acetaminophen  (TYLENOL ) 650 MG CR tablet Take 1,300 mg by mouth 2 (two) times daily as needed for pain.   Yes [provider]  budesonide -formoterol  (SYMBICORT ) 160-4.5 MCG/ACT inhaler Inhale 2 puffs into the lungs in the morning and at bedtime. Patient taking differently: Inhale 2 puffs into the lungs 2 (two) times daily as needed (shortness of breath). 01/01/23  Yes Hunsucker, Archer Kobs, MD  gabapentin  (NEURONTIN ) 300 MG capsule Take 1 tablet by mouth at bedtime for 3 days. Then increase 1 tablet twice a day. Patient taking differently: Take 300 mg by mouth 2 (two) times daily as needed (pain, neuropathy). 08/06/23  Yes Zelphia Higashi, MD  LINZESS  145 MCG CAPS capsule Take 145 mcg by mouth daily before breakfast. 12/07/21  Yes [provider]  mycophenolate (CELLCEPT) 500 MG tablet Take 1,500 mg by mouth 2 (two) times daily.   Yes [provider]  PROAIR  HFA 108 (90 Base) MCG/ACT inhaler Inhale 2 puffs into the lungs every 4 (four) hours as needed for wheezing or shortness of breath. 12/22/21  Yes O'Neal, Cathay Clonts, MD      Allergies    Patient has no known allergies.     Review of Systems   Review of Systems  All other systems reviewed and are negative.   Physical Exam Updated Vital Signs BP (!) 161/80   Pulse (!) 37   Temp 97.7 F (36.5 C)   Resp 16   Ht 5\' 5"  (1.651 m)   Wt 122.5 kg   LMP 10/28/2023 (Approximate)   SpO2 100%   BMI 44.93 kg/m  Physical Exam Vitals and nursing note reviewed.  Constitutional:      General: She is not in acute distress.    Appearance: She is well-developed.  HENT:     Head: Normocephalic and atraumatic.  Eyes:     Conjunctiva/sclera: Conjunctivae normal.  Cardiovascular:     Rate and Rhythm: Normal rate and regular rhythm.     Heart sounds: No murmur heard. Pulmonary:     Effort: Pulmonary effort is normal. No respiratory distress.     Breath sounds: Normal breath sounds.  Abdominal:     Palpations: Abdomen is soft.     Tenderness: There is no abdominal tenderness.  Genitourinary:    Comments: Mild right CVA tenderness  Musculoskeletal:        General: No swelling.     Cervical back: Neck supple.  Skin:    General: Skin is warm and dry.     Capillary Refill: Capillary refill takes less than 2 seconds.  Neurological:     Mental Status: She is alert.  Psychiatric:  Mood and Affect: Mood normal.     ED Results / Procedures / Treatments   Labs (all labs ordered are listed, but only abnormal results are displayed) Labs Reviewed  BASIC METABOLIC PANEL WITH GFR - Abnormal; Notable for the following components:      Result Value   Glucose, Bld 137 (*)    Calcium 8.6 (*)    All other components within normal limits  CBC - Abnormal; Notable for the following components:   Hemoglobin 11.9 (*)    All other components within normal limits  URINALYSIS, ROUTINE W REFLEX MICROSCOPIC  HCG, SERUM, QUALITATIVE  HEPATIC FUNCTION PANEL  LIPASE, BLOOD    EKG None  Radiology CT ABDOMEN PELVIS WO CONTRAST Result Date: 11/22/2023 CLINICAL DATA:  Right-sided flank pain EXAM: CT ABDOMEN AND  PELVIS WITHOUT CONTRAST TECHNIQUE: Multidetector CT imaging of the abdomen and pelvis was performed following the standard protocol without IV contrast. RADIATION DOSE REDUCTION: This exam was performed according to the departmental dose-optimization program which includes automated exposure control, adjustment of the mA and/or kV according to patient size and/or use of iterative reconstruction technique. COMPARISON:  02/07/2018 CT FINDINGS: Lower chest: Minimal motion degradation in the lower chest. Bibasilar scarring. Left lower lobe 4 mm pulmonary nodule is similar on the prior, can be presumed benign and do/does not warrant imaging follow-up per Fleischner criteria. Normal heart size without pericardial or pleural effusion. 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 Normal gallbladder, without biliary duct dilatation. Pancreas: Difficult to delineate secondary to anatomic distortion. Spleen: Spleen/splenules in the right upper quadrant. Adrenals/Urinary Tract: Normal adrenal glands. No renal calculi. Mild right-sided hydroureteronephrosis with perinephric and periureteric edema. Followed to the level of a distal right ureteric 2 mm stone on 77/3. No bladder calculi. Stomach/Bowel: Stomach positioned in the right upper quadrant. Left sided colonic position. Normal left-sided terminal ileum. Normal appendix in the left lower quadrant on 69/3. Small-bowel primarily positioned in the right side of the abdomen and pelvis, without bowel obstruction. Vascular/Lymphatic: Normal aortic caliber. No abdominopelvic adenopathy. Reproductive: Normal uterus and adnexa. Other: Trace free pelvic fluid is likely physiologic. No free intraperitoneal air. Musculoskeletal: Nonacute right 8 posterior rib fracture. IMPRESSION: 1. Mild right-sided hydroureteronephrosis secondary to a distal right ureteric 2 mm stone. 2. Heterotaxy syndrome, with transverse liver, right sided spleen,  stomach, small bowel, left-sided colon. 3.  Trace free pelvic fluid is likely physiologic. Electronically Signed   By: Lore Rode M.D.   On: 11/22/2023 09:34    Procedures Procedures    Medications Ordered in ED Medications  oxyCODONE -acetaminophen  (PERCOCET/ROXICET) 5-325 MG per tablet 1 tablet (1 tablet Oral Given 11/22/23 0412)  ketorolac  (TORADOL ) 30 MG/ML injection 15 mg (15 mg Intravenous Given 11/22/23 0939)  sodium chloride  0.9 % bolus 1,000 mL (0 mLs Intravenous Stopped 11/22/23 1058)  ondansetron  (ZOFRAN ) injection 4 mg (4 mg Intravenous Given 11/22/23 0938)  morphine (PF) 4 MG/ML injection 4 mg (4 mg Intravenous Given 11/22/23 1059)    ED Course/ Medical Decision Making/ A&P                                 Medical Decision Making Amount and/or Complexity of Data Reviewed Labs: ordered. Radiology: ordered.  Risk Prescription drug management.    Medical Screen Complete  This patient presented to the ED with complaint of right flank pain.  This complaint involves an extensive number of treatment  options. The initial differential diagnosis includes, but is not limited to, renal colic, pyelonephritis, cholecystitis, metabolic abnormality, etc.  This presentation is: Acute, Self-Limited, Previously Undiagnosed, Uncertain Prognosis, Complicated, Systemic Symptoms, and Threat to Life/Bodily Function  Patient is presenting with acute right flank pain.  Symptoms described are consistent with likely renal colic.  CT confirms presence of right sided ureteral stone.  With IV fluids, pain medication patient feels much improved.  On reevaluation she desires discharge.  Other screening labs obtained are without significant acute abnormality.  Importance of close follow-up stressed.  Strict return precautions given and understood.  Additional history obtained:  External records from outside sources obtained and reviewed including prior ED visits and prior Inpatient records.     Problem List / ED Course:  Renal colic    Reevaluation:  After the interventions noted above, I reevaluated the patient and found that they have: improved   Disposition:  After consideration of the diagnostic results and the patients response to treatment, I feel that the patent would benefit from close outpatient followup.          Final Clinical Impression(s) / ED Diagnoses Final diagnoses:  Renal colic    Rx / DC Orders ED Discharge Orders          Ordered    tamsulosin (FLOMAX) 0.4 MG CAPS capsule  Daily        11/22/23 1157    ondansetron  (ZOFRAN ) 4 MG tablet  Every 6 hours        11/22/23 1157    oxyCODONE -acetaminophen  (PERCOCET/ROXICET) 5-325 MG tablet  Every 6 hours PRN        11/22/23 1157              Burnette Carte, MD 11/22/23 1158

## 2023-11-22 NOTE — Discharge Instructions (Signed)
 Return for any problem.  ?

## 2023-11-22 NOTE — ED Triage Notes (Addendum)
  Patient comes in with R flank pain that started last night around midnight.  Patient states it starts in lower back and comes around R side.  Denies any urinary symptoms.  Endorses 10/10 sharp/stabbing pain, and nausea.  No emesis at this time.    Denies any chest pain.  States she is on immunosuppressive medication for interstitial lung disease.  Wears 2-3 L of O2 at home intermittently.

## 2023-11-25 DIAGNOSIS — R0602 Shortness of breath: Secondary | ICD-10-CM | POA: Diagnosis not present

## 2023-11-25 DIAGNOSIS — J849 Interstitial pulmonary disease, unspecified: Secondary | ICD-10-CM | POA: Diagnosis not present

## 2023-11-28 NOTE — Telephone Encounter (Signed)
 Tezspire  remains excluded from specialty pharmacy benefits plans. Appeal is not available still. MyChart message sent to patient for update on if she's been taking Tezspire   Alaze Garverick, PharmD, MPH, BCPS, CPP Clinical Pharmacist (Rheumatology and Pulmonology)

## 2023-11-29 ENCOUNTER — Other Ambulatory Visit: Payer: Self-pay | Admitting: Pharmacist

## 2023-11-29 ENCOUNTER — Telehealth: Payer: Self-pay | Admitting: Pharmacy Technician

## 2023-11-29 DIAGNOSIS — J849 Interstitial pulmonary disease, unspecified: Secondary | ICD-10-CM | POA: Diagnosis not present

## 2023-11-29 DIAGNOSIS — Z79899 Other long term (current) drug therapy: Secondary | ICD-10-CM | POA: Diagnosis not present

## 2023-11-29 DIAGNOSIS — J4550 Severe persistent asthma, uncomplicated: Secondary | ICD-10-CM | POA: Insufficient documentation

## 2023-11-29 NOTE — Progress Notes (Signed)
 Therapy plan placed for Tezspire  SQ ([id number]) for [facility] to start benefits investigation  Diagnosis: severe persistent asthma  Provider: Dr. Marygrace Snellen  Dose: 210mg  subcut every 4 weeks  Last Clinic Visit: 09/09/23 Next Clinic Visit: Dr. Sylvania Evens, PharmD, MPH, BCPS, CPP Clinical Pharmacist (Rheumatology and Pulmonology)

## 2023-11-29 NOTE — Addendum Note (Signed)
 Addended by: Thais Fill on: 11/29/2023 02:55 PM   Modules accepted: Orders

## 2023-11-29 NOTE — Telephone Encounter (Signed)
 Could try Dupixent given repeated steroid courses.

## 2023-11-29 NOTE — Telephone Encounter (Signed)
 Devki,  Tezspire  is an excluded med under pharmacy and medical benefits.  It will not be covered. Preferred medications are: Dupixent Fasenra Nucala.  Phone: [phone] Medical benefit ref: [id number] Pharamacy benefit rep: Rob-R 11/29/23 2:22p  Thanks Burdette Carolin

## 2023-11-29 NOTE — Progress Notes (Signed)
 Therapy plan ofr Tezspire  discontinued. Excluded from both pharmacy and medical benefits. Routed to Dr. Marygrace Snellen for alt treatment  Averill Winters, PharmD, MPH, BCPS, CPP Clinical Pharmacist (Rheumatology and Pulmonology)

## 2023-12-02 ENCOUNTER — Telehealth: Payer: Self-pay

## 2023-12-02 DIAGNOSIS — R7989 Other specified abnormal findings of blood chemistry: Secondary | ICD-10-CM | POA: Diagnosis not present

## 2023-12-02 DIAGNOSIS — D649 Anemia, unspecified: Secondary | ICD-10-CM | POA: Diagnosis not present

## 2023-12-02 NOTE — Telephone Encounter (Signed)
 Patient unable to continue Tezspire  due to bridge program exhaustion and insurance denial (no appeal process available)  Submitted a Prior Authorization request to [employer] for DUPIXENT via fax. Will update once we receive a response.  Fax: [phone] Phone: [phone]  Geraldene Kleine, PharmD, MPH, BCPS, CPP Clinical Pharmacist (Rheumatology and Pulmonology)

## 2023-12-11 DIAGNOSIS — Z1331 Encounter for screening for depression: Secondary | ICD-10-CM | POA: Diagnosis not present

## 2023-12-11 DIAGNOSIS — J849 Interstitial pulmonary disease, unspecified: Secondary | ICD-10-CM | POA: Diagnosis not present

## 2023-12-11 DIAGNOSIS — Z0000 Encounter for general adult medical examination without abnormal findings: Secondary | ICD-10-CM | POA: Diagnosis not present

## 2023-12-11 DIAGNOSIS — Z1339 Encounter for screening examination for other mental health and behavioral disorders: Secondary | ICD-10-CM | POA: Diagnosis not present

## 2023-12-14 ENCOUNTER — Encounter (HOSPITAL_COMMUNITY): Payer: Self-pay

## 2023-12-14 ENCOUNTER — Emergency Department (HOSPITAL_COMMUNITY)
Admission: EM | Admit: 2023-12-14 | Discharge: 2023-12-14 | Disposition: A | Discharge status: Home / Self Care | Attending: Emergency Medicine | Admitting: Emergency Medicine

## 2023-12-14 ENCOUNTER — Other Ambulatory Visit: Payer: Self-pay

## 2023-12-14 ENCOUNTER — Emergency Department (HOSPITAL_COMMUNITY)

## 2023-12-14 DIAGNOSIS — N132 Hydronephrosis with renal and ureteral calculous obstruction: Secondary | ICD-10-CM | POA: Insufficient documentation

## 2023-12-14 DIAGNOSIS — R109 Unspecified abdominal pain: Secondary | ICD-10-CM | POA: Diagnosis not present

## 2023-12-14 DIAGNOSIS — N201 Calculus of ureter: Secondary | ICD-10-CM | POA: Diagnosis not present

## 2023-12-14 LAB — COMPREHENSIVE METABOLIC PANEL WITH GFR
ALT: 11 U/L (ref 0–44)
AST: 17 U/L (ref 15–41)
Albumin: 3.7 g/dL (ref 3.5–5.0)
Alkaline Phosphatase: 83 U/L (ref 38–126)
Anion gap: 11 (ref 5–15)
BUN: 12 mg/dL (ref 6–20)
CO2: 26 mmol/L (ref 22–32)
Calcium: 9.3 mg/dL (ref 8.9–10.3)
Chloride: 97 mmol/L — ABNORMAL LOW (ref 98–111)
Creatinine, Ser: 1.23 mg/dL — ABNORMAL HIGH (ref 0.44–1.00)
GFR, Estimated: 55 mL/min — ABNORMAL LOW (ref >60)
Glucose, Bld: 116 mg/dL — ABNORMAL HIGH (ref 70–99)
Potassium: 4.2 mmol/L (ref 3.5–5.1)
Sodium: 134 mmol/L — ABNORMAL LOW (ref 135–145)
Total Bilirubin: 0.7 mg/dL (ref 0.0–1.2)
Total Protein: 8.4 g/dL — ABNORMAL HIGH (ref 6.5–8.1)

## 2023-12-14 LAB — URINALYSIS, ROUTINE W REFLEX MICROSCOPIC
Bilirubin Urine: NEGATIVE
Glucose, UA: NEGATIVE mg/dL
Ketones, ur: 20 mg/dL — AB
Nitrite: NEGATIVE
Protein, ur: 100 mg/dL — AB
Specific Gravity, Urine: 1.019 (ref 1.005–1.030)
pH: 5 (ref 5.0–8.0)

## 2023-12-14 LAB — CBC
HCT: 41.9 % (ref 36.0–46.0)
Hemoglobin: 13.7 g/dL (ref 12.0–15.0)
MCH: 29.7 pg (ref 26.0–34.0)
MCHC: 32.7 g/dL (ref 30.0–36.0)
MCV: 90.7 fL (ref 80.0–100.0)
Platelets: 438 10*3/uL — ABNORMAL HIGH (ref 150–400)
RBC: 4.62 MIL/uL (ref 3.87–5.11)
RDW: 12.1 % (ref 11.5–15.5)
WBC: 10.2 10*3/uL (ref 4.0–10.5)
nRBC: 0 % (ref 0.0–0.2)

## 2023-12-14 LAB — HCG, SERUM, QUALITATIVE: Preg, Serum: NEGATIVE

## 2023-12-14 MED ORDER — OXYCODONE-ACETAMINOPHEN 5-325 MG PO TABS
1.0000 | ORAL_TABLET | Freq: Once | ORAL | Status: AC
  Administered 2023-12-14: 1 via ORAL
  Filled 2023-12-14: qty 1

## 2023-12-14 MED ORDER — ONDANSETRON 4 MG PO TBDP: 4.0000 mg | ORAL_TABLET | Freq: Three times a day (TID) | ORAL | 0 refills | Status: DC | PRN

## 2023-12-14 MED ORDER — LACTATED RINGERS IV BOLUS
1000.0000 mL | Freq: Once | INTRAVENOUS | Status: AC
  Administered 2023-12-14: 1000 mL via INTRAVENOUS

## 2023-12-14 MED ORDER — TAMSULOSIN HCL 0.4 MG PO CAPS
0.4000 mg | ORAL_CAPSULE | Freq: Once | ORAL | Status: AC
  Administered 2023-12-14: 0.4 mg via ORAL
  Filled 2023-12-14: qty 1

## 2023-12-14 MED ORDER — KETOROLAC TROMETHAMINE 15 MG/ML IJ SOLN
15.0000 mg | Freq: Once | INTRAMUSCULAR | Status: AC
  Administered 2023-12-14: 15 mg via INTRAVENOUS
  Filled 2023-12-14: qty 1

## 2023-12-14 MED ORDER — TAMSULOSIN HCL 0.4 MG PO CAPS: 0.4000 mg | ORAL_CAPSULE | Freq: Every day | ORAL | 0 refills | Status: DC

## 2023-12-14 MED ORDER — HYDROMORPHONE HCL 1 MG/ML IJ SOLN
1.0000 mg | Freq: Once | INTRAMUSCULAR | Status: AC
  Administered 2023-12-14: 1 mg via INTRAVENOUS
  Filled 2023-12-14: qty 1

## 2023-12-14 MED ORDER — ONDANSETRON HCL 4 MG/2ML IJ SOLN
4.0000 mg | Freq: Once | INTRAMUSCULAR | Status: AC
Start: 2023-12-14 — End: 2023-12-14
  Administered 2023-12-14: 4 mg via INTRAVENOUS
  Filled 2023-12-14: qty 2

## 2023-12-14 MED ORDER — OXYCODONE-ACETAMINOPHEN 5-325 MG PO TABS: 1.0000 | ORAL_TABLET | Freq: Four times a day (QID) | ORAL | 0 refills | Status: DC | PRN

## 2023-12-14 NOTE — ED Provider Notes (Signed)
 [place] [facility] AT [facility] Provider Note   CSN: [id number] Arrival date & time: 12/14/23  1108     History  Chief Complaint  Patient presents with   Flank Pain    Katie Knight is a 48 y.o. female.  HPI 48 year old female presents with right sided pain.  Starts in her right side and goes to her back.  Started last night.  This is similar to when she had kidney stone earlier in the month.  She is not sure if she passed that previous kidney stone though the pain did temporarily go away.  It came back, most recently about a week ago but then went away again.  Now it is back and has been constant.  No urinary symptoms such as hematuria or dysuria.  She has had some vomiting.  She tried tramadol  and ibuprofen  last night without relief.  No fevers, cough, shortness of breath, chest pain.  Home Medications Prior to Admission medications   Medication Sig Start Date End Date Taking? Authorizing Provider  ondansetron  (ZOFRAN -ODT) 4 MG disintegrating tablet Take 1 tablet (4 mg total) by mouth every 8 (eight) hours as needed for nausea or vomiting. 12/14/23  Yes Jerilynn Montenegro, MD  oxyCODONE -acetaminophen  (PERCOCET/ROXICET) 5-325 MG tablet Take 1 tablet by mouth every 6 (six) hours as needed for severe pain (pain score 7-10). 12/14/23  Yes Jerilynn Montenegro, MD  tamsulosin  (FLOMAX ) 0.4 MG CAPS capsule Take 1 capsule (0.4 mg total) by mouth daily. 12/15/23  Yes Jerilynn Montenegro, MD  acetaminophen  (TYLENOL ) 650 MG CR tablet Take 1,300 mg by mouth 2 (two) times daily as needed for pain.    [provider]  budesonide -formoterol  (SYMBICORT ) 160-4.5 MCG/ACT inhaler Inhale 2 puffs into the lungs in the morning and at bedtime. Patient taking differently: Inhale 2 puffs into the lungs 2 (two) times daily as needed (shortness of breath). 01/01/23   Hunsucker, Archer Kobs, MD  gabapentin  (NEURONTIN ) 300 MG capsule Take 1 tablet by mouth at bedtime for 3 days. Then increase 1  tablet twice a day. Patient taking differently: Take 300 mg by mouth 2 (two) times daily as needed (pain, neuropathy). 08/06/23   Zelphia Higashi, MD  LINZESS  145 MCG CAPS capsule Take 145 mcg by mouth daily before breakfast. 12/07/21   [provider]  mycophenolate (CELLCEPT) 500 MG tablet Take 1,500 mg by mouth 2 (two) times daily.    [provider]  ondansetron  (ZOFRAN ) 4 MG tablet Take 1 tablet (4 mg total) by mouth every 6 (six) hours. 11/22/23   Burnette Carte, MD  PROAIR  HFA 108 (90 Base) MCG/ACT inhaler Inhale 2 puffs into the lungs every 4 (four) hours as needed for wheezing or shortness of breath. 12/22/21   O'Neal, Cathay Clonts, MD      Allergies    Patient has no known allergies.    Review of Systems   Review of Systems  Constitutional:  Negative for fever.  Respiratory:  Negative for cough and shortness of breath.   Cardiovascular:  Negative for chest pain.  Gastrointestinal:  Positive for abdominal pain and vomiting.  Genitourinary:  Positive for flank pain. Negative for dysuria and hematuria.  Musculoskeletal:  Positive for back pain.    Physical Exam Updated Vital Signs BP (!) 146/92   Pulse 96   Temp (!) 97.5 F (36.4 C)   Resp 18   Ht 5\' 4"  (1.626 m)   Wt 117.9 kg   LMP 10/28/2023 (Approximate)  SpO2 99%   BMI 44.63 kg/m  Physical Exam Vitals and nursing note reviewed.  Constitutional:      Appearance: She is well-developed.  HENT:     Head: Normocephalic and atraumatic.  Cardiovascular:     Rate and Rhythm: Normal rate and regular rhythm.     Heart sounds: Normal heart sounds.  Pulmonary:     Effort: Pulmonary effort is normal.     Breath sounds: Normal breath sounds.  Abdominal:     Palpations: Abdomen is soft.     Tenderness: There is no abdominal tenderness. There is right CVA tenderness.    Skin:    General: Skin is warm and dry.     Findings: No rash.  Neurological:     Mental Status: She is alert.     ED  Results / Procedures / Treatments   Labs (all labs ordered are listed, but only abnormal results are displayed) Labs Reviewed  URINALYSIS, ROUTINE W REFLEX MICROSCOPIC - Abnormal; Notable for the following components:      Result Value   APPearance HAZY (*)    Hgb urine dipstick SMALL (*)    Ketones, ur 20 (*)    Protein, ur 100 (*)    Leukocytes,Ua TRACE (*)    Bacteria, UA RARE (*)    All other components within normal limits  CBC - Abnormal; Notable for the following components:   Platelets 438 (*)    All other components within normal limits  COMPREHENSIVE METABOLIC PANEL WITH GFR - Abnormal; Notable for the following components:   Sodium 134 (*)    Chloride 97 (*)    Glucose, Bld 116 (*)    Creatinine, Ser 1.23 (*)    Total Protein 8.4 (*)    GFR, Estimated 55 (*)    All other components within normal limits  HCG, SERUM, QUALITATIVE    EKG None  Radiology CT Renal Stone Study Result Date: 12/14/2023 CLINICAL DATA:  Right-sided abdominal pain. Kidney stones suspected. EXAM: CT ABDOMEN AND PELVIS WITHOUT CONTRAST TECHNIQUE: Multidetector CT imaging of the abdomen and pelvis was performed following the standard protocol without IV contrast. RADIATION DOSE REDUCTION: This exam was performed according to the departmental dose-optimization program which includes automated exposure control, adjustment of the mA and/or kV according to patient size and/or use of iterative reconstruction technique. COMPARISON:  11/22/2023.  Abdomen and pelvis CT venogram 02/07/2018 FINDINGS: Lower chest: Surgical changes are seen in the right lung base. 5 mm left lower lobe nodule stable since chest CT 05/17/2023. Hepatobiliary: As before, liver is atypically positioned in the anterior abdomen. There is no evidence for gallstones, gallbladder wall thickening, or pericholecystic fluid. No intrahepatic or extrahepatic biliary dilation. Pancreas: No focal mass lesion. No dilatation of the main duct. No  intraparenchymal cyst. No peripancreatic edema. Spleen: Spleen is identified in the right posterior subdiaphragmatic space. Adrenals/Urinary Tract: No adrenal nodule or mass. Left kidney and ureter are unremarkable. There is mild right hydroureteronephrosis with a 6 x 3 x 3 mm stone in the distal right ureter (image 80/3 and coronal 48/6). The urinary bladder appears normal for the degree of distention. Stomach/Bowel: Stomach is positioned in the right abdomen. Small bowel is positioned in the right abdomen with colonic anatomy concentrated in the left abdomen The terminal ileum is normal. The appendix is not well visualized, The appendix is normal. Vascular/Lymphatic: No abdominal aortic aneurysm. No abdominal aortic atherosclerotic calcification. Left-sided IVC evident. There is no gastrohepatic or hepatoduodenal ligament lymphadenopathy. No retroperitoneal  or mesenteric lymphadenopathy. No pelvic sidewall lymphadenopathy. Reproductive: The uterus is unremarkable.  There is no adnexal mass. Other: Trace free fluid is seen in the cul-de-sac. Musculoskeletal: No worrisome lytic or sclerotic osseous abnormality. IMPRESSION: 1. 6 x 3 x 3 mm stone in the distal right ureter with mild right hydroureteronephrosis, similar to 11/22/2023 exam. No other urinary stone disease evident. 2. Liver is atypically positioned in the anterior abdomen with right-sided spleen and stomach. Small bowel is concentrated in the right abdomen and colon is concentrated in the left abdomen. 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 3. Trace free fluid in the cul-de-sac. 4. Stable 5 mm left lower lobe nodule comparing back to the 2019 exam consistent with benign etiology. No followup imaging is recommended. Electronically Signed   By: Donnal Fusi M.D.   On: 12/14/2023 13:39    Procedures Procedures    Medications Ordered in ED Medications  HYDROmorphone  (DILAUDID ) injection 1 mg (1 mg  Intravenous Given 12/14/23 1219)  ketorolac  (TORADOL ) 15 MG/ML injection 15 mg (15 mg Intravenous Given 12/14/23 1220)  lactated ringers  bolus 1,000 mL (1,000 mLs Intravenous New Bag/Given 12/14/23 1219)  ondansetron  (ZOFRAN ) injection 4 mg (4 mg Intravenous Given 12/14/23 1224)  tamsulosin  (FLOMAX ) capsule 0.4 mg (0.4 mg Oral Given 12/14/23 1438)  oxyCODONE -acetaminophen  (PERCOCET/ROXICET) 5-325 MG per tablet 1 tablet (1 tablet Oral Given 12/14/23 1438)    ED Course/ Medical Decision Making/ A&P                                 Medical Decision Making Amount and/or Complexity of Data Reviewed Labs: ordered.    Details: Normal WBC Radiology: ordered and independent interpretation performed.    Details: Right ureteral stone.  Risk Prescription drug management.   Patient presents with right-sided ureteral colic.  Feeling a lot better after some Dilaudid  and Toradol .  Did get a little nauseated so was given Zofran .  Has a mild bump in her creatinine which I suspect is from vomiting and was given some fluids.  At this point her pain seems to be controlled.  CT shows a similarly placed stone, I suspect she never passed the first 1.  Measurements this time do indicate that 1 aspect is 6 mm.  Given this, I think it is reasonable to put her on Flomax , especially since she has not passed the stone.  She feels well enough for discharge, will have her stop tramadol  which she was just recently prescribed and do oxycodone  instead but also encouraged her to continue ibuprofen .  She was noted to be pretty hypertensive when she arrived though I suspect at least part of this was pain as the blood pressure is now improving though still little high.  I discussed she needs to get this rechecked with her doctor.  Urine does not seem consistent with a UTI.  Has some squamous epithelial cells.  Low suspicion this is an infected stone.  Will discharge home with return precautions.        Final Clinical  Impression(s) / ED Diagnoses Final diagnoses:  Ureteral stone    Rx / DC Orders ED Discharge Orders          Ordered    tamsulosin  (FLOMAX ) 0.4 MG CAPS capsule  Daily        12/14/23 1449    oxyCODONE -acetaminophen  (PERCOCET/ROXICET) 5-325 MG tablet  Every 6 hours PRN  12/14/23 1449    ondansetron  (ZOFRAN -ODT) 4 MG disintegrating tablet  Every 8 hours PRN        12/14/23 1450              Jerilynn Montenegro, MD 12/14/23 1454

## 2023-12-14 NOTE — Discharge Instructions (Addendum)
 Do NOT take the Tramadol . We are replacing this with Oxycodone  to treat your pain.  It is still important to take ibuprofen  as this can also help with kidney stone pain.  We are putting you on a medicine called Flomax /tamsulosin  to help you pass the kidney stone.  You were given your first dose today, takes your next dose tomorrow.  Your blood pressure was elevated today.  While did return to a better number, it is still little elevated and you need to get this rechecked with your primary care physician.  Call the urologist on Monday for an urgent outpatient appointment.  However if you develop fever, new or worsening or intractable pain, vomiting, or any other new/concerning symptoms then return to the ER.

## 2023-12-14 NOTE — ED Notes (Signed)
 Discharged by RN. Patient to be picked up by family. In wheelchair to lobby.

## 2023-12-14 NOTE — ED Triage Notes (Signed)
 Pt c.o right sided flank pain with emesis x 1 today. Pt was seen here 3 weeks ago for a kidney stone on the right side.

## 2023-12-14 NOTE — ED Notes (Signed)
 Patient transported to CT

## 2023-12-16 DIAGNOSIS — N201 Calculus of ureter: Secondary | ICD-10-CM | POA: Diagnosis not present

## 2023-12-18 ENCOUNTER — Other Ambulatory Visit: Payer: Self-pay | Admitting: Urology

## 2023-12-18 DIAGNOSIS — N132 Hydronephrosis with renal and ureteral calculous obstruction: Secondary | ICD-10-CM | POA: Diagnosis not present

## 2023-12-18 DIAGNOSIS — N200 Calculus of kidney: Secondary | ICD-10-CM | POA: Diagnosis not present

## 2023-12-18 DIAGNOSIS — N201 Calculus of ureter: Secondary | ICD-10-CM | POA: Diagnosis not present

## 2023-12-19 LAB — SURGICAL PATHOLOGY

## 2023-12-26 DIAGNOSIS — J849 Interstitial pulmonary disease, unspecified: Secondary | ICD-10-CM | POA: Diagnosis not present

## 2023-12-26 DIAGNOSIS — R0602 Shortness of breath: Secondary | ICD-10-CM | POA: Diagnosis not present

## 2024-02-05 ENCOUNTER — Telehealth (HOSPITAL_COMMUNITY): Payer: Self-pay

## 2024-02-05 NOTE — Telephone Encounter (Signed)
 Pt insurance is active and benefits verified through [employer] Co-pay 0, DED $1,000/$105 met, out of pocket $2,500/$109.69 met, co-insurance 10%. no pre-authorization required, Debbie/[employer] 02/05/2024@9 :22, REF# [id number] 144 VISTS PER 3 YEARS 0/144 USED

## 2024-02-15 ENCOUNTER — Emergency Department (HOSPITAL_COMMUNITY)

## 2024-02-15 ENCOUNTER — Encounter (HOSPITAL_COMMUNITY): Payer: Self-pay | Admitting: Internal Medicine

## 2024-02-15 ENCOUNTER — Encounter (HOSPITAL_COMMUNITY)
Admission: EM | Disposition: A | Discharge status: Nursing Home (Includes ICF) | Payer: Self-pay | Source: Home / Self Care | Attending: Internal Medicine

## 2024-02-15 ENCOUNTER — Other Ambulatory Visit: Payer: Self-pay

## 2024-02-15 ENCOUNTER — Inpatient Hospital Stay (HOSPITAL_COMMUNITY)
Admission: EM | Admit: 2024-02-15 | Discharge: 2024-03-04 | DRG: 003 | Disposition: A | Discharge status: Other Acute Inpatient Hospital | Attending: Internal Medicine | Admitting: Internal Medicine

## 2024-02-15 ENCOUNTER — Inpatient Hospital Stay (HOSPITAL_COMMUNITY): Admitting: Certified Registered Nurse Anesthetist

## 2024-02-15 ENCOUNTER — Inpatient Hospital Stay (HOSPITAL_COMMUNITY)

## 2024-02-15 DIAGNOSIS — I69298 Other sequelae of other nontraumatic intracranial hemorrhage: Secondary | ICD-10-CM | POA: Diagnosis not present

## 2024-02-15 DIAGNOSIS — R29731 NIHSS score 31: Secondary | ICD-10-CM | POA: Diagnosis present

## 2024-02-15 DIAGNOSIS — J9811 Atelectasis: Secondary | ICD-10-CM | POA: Diagnosis not present

## 2024-02-15 DIAGNOSIS — R4182 Altered mental status, unspecified: Secondary | ICD-10-CM | POA: Diagnosis not present

## 2024-02-15 DIAGNOSIS — E785 Hyperlipidemia, unspecified: Secondary | ICD-10-CM | POA: Diagnosis not present

## 2024-02-15 DIAGNOSIS — E860 Dehydration: Secondary | ICD-10-CM | POA: Diagnosis present

## 2024-02-15 DIAGNOSIS — I675 Moyamoya disease: Secondary | ICD-10-CM | POA: Diagnosis present

## 2024-02-15 DIAGNOSIS — D72829 Elevated white blood cell count, unspecified: Secondary | ICD-10-CM | POA: Diagnosis not present

## 2024-02-15 DIAGNOSIS — R6510 Systemic inflammatory response syndrome (SIRS) of non-infectious origin without acute organ dysfunction: Secondary | ICD-10-CM

## 2024-02-15 DIAGNOSIS — R4701 Aphasia: Secondary | ICD-10-CM | POA: Diagnosis present

## 2024-02-15 DIAGNOSIS — I6011 Nontraumatic subarachnoid hemorrhage from right middle cerebral artery: Secondary | ICD-10-CM | POA: Diagnosis present

## 2024-02-15 DIAGNOSIS — R29727 NIHSS score 27: Secondary | ICD-10-CM

## 2024-02-15 DIAGNOSIS — J45909 Unspecified asthma, uncomplicated: Secondary | ICD-10-CM | POA: Diagnosis present

## 2024-02-15 DIAGNOSIS — K5900 Constipation, unspecified: Secondary | ICD-10-CM | POA: Diagnosis not present

## 2024-02-15 DIAGNOSIS — A419 Sepsis, unspecified organism: Secondary | ICD-10-CM | POA: Diagnosis not present

## 2024-02-15 DIAGNOSIS — R29720 NIHSS score 20: Secondary | ICD-10-CM | POA: Diagnosis not present

## 2024-02-15 DIAGNOSIS — I618 Other nontraumatic intracerebral hemorrhage: Secondary | ICD-10-CM | POA: Diagnosis not present

## 2024-02-15 DIAGNOSIS — E871 Hypo-osmolality and hyponatremia: Secondary | ICD-10-CM | POA: Diagnosis not present

## 2024-02-15 DIAGNOSIS — R739 Hyperglycemia, unspecified: Secondary | ICD-10-CM | POA: Diagnosis not present

## 2024-02-15 DIAGNOSIS — Z8249 Family history of ischemic heart disease and other diseases of the circulatory system: Secondary | ICD-10-CM

## 2024-02-15 DIAGNOSIS — R29818 Other symptoms and signs involving the nervous system: Secondary | ICD-10-CM | POA: Diagnosis not present

## 2024-02-15 DIAGNOSIS — J84112 Idiopathic pulmonary fibrosis: Secondary | ICD-10-CM | POA: Diagnosis present

## 2024-02-15 DIAGNOSIS — H5347 Heteronymous bilateral field defects: Secondary | ICD-10-CM | POA: Diagnosis present

## 2024-02-15 DIAGNOSIS — G8194 Hemiplegia, unspecified affecting left nondominant side: Secondary | ICD-10-CM | POA: Diagnosis present

## 2024-02-15 DIAGNOSIS — G9389 Other specified disorders of brain: Secondary | ICD-10-CM | POA: Diagnosis present

## 2024-02-15 DIAGNOSIS — Y95 Nosocomial condition: Secondary | ICD-10-CM | POA: Diagnosis not present

## 2024-02-15 DIAGNOSIS — I609 Nontraumatic subarachnoid hemorrhage, unspecified: Secondary | ICD-10-CM | POA: Diagnosis not present

## 2024-02-15 DIAGNOSIS — Z930 Tracheostomy status: Secondary | ICD-10-CM | POA: Diagnosis not present

## 2024-02-15 DIAGNOSIS — J15211 Pneumonia due to Methicillin susceptible Staphylococcus aureus: Secondary | ICD-10-CM | POA: Diagnosis present

## 2024-02-15 DIAGNOSIS — I6200 Nontraumatic subdural hemorrhage, unspecified: Secondary | ICD-10-CM

## 2024-02-15 DIAGNOSIS — I611 Nontraumatic intracerebral hemorrhage in hemisphere, cortical: Secondary | ICD-10-CM | POA: Diagnosis present

## 2024-02-15 DIAGNOSIS — J690 Pneumonitis due to inhalation of food and vomit: Secondary | ICD-10-CM | POA: Diagnosis not present

## 2024-02-15 DIAGNOSIS — G932 Benign intracranial hypertension: Secondary | ICD-10-CM | POA: Diagnosis present

## 2024-02-15 DIAGNOSIS — R29722 NIHSS score 22: Secondary | ICD-10-CM | POA: Diagnosis not present

## 2024-02-15 DIAGNOSIS — R29718 NIHSS score 18: Secondary | ICD-10-CM | POA: Diagnosis not present

## 2024-02-15 DIAGNOSIS — R414 Neurologic neglect syndrome: Secondary | ICD-10-CM | POA: Diagnosis present

## 2024-02-15 DIAGNOSIS — I729 Aneurysm of unspecified site: Secondary | ICD-10-CM | POA: Diagnosis not present

## 2024-02-15 DIAGNOSIS — R29724 NIHSS score 24: Secondary | ICD-10-CM | POA: Diagnosis not present

## 2024-02-15 DIAGNOSIS — I10 Essential (primary) hypertension: Secondary | ICD-10-CM | POA: Diagnosis present

## 2024-02-15 DIAGNOSIS — D84821 Immunodeficiency due to drugs: Secondary | ICD-10-CM | POA: Diagnosis present

## 2024-02-15 DIAGNOSIS — I639 Cerebral infarction, unspecified: Secondary | ICD-10-CM | POA: Diagnosis not present

## 2024-02-15 DIAGNOSIS — D649 Anemia, unspecified: Secondary | ICD-10-CM | POA: Diagnosis not present

## 2024-02-15 DIAGNOSIS — J9621 Acute and chronic respiratory failure with hypoxia: Secondary | ICD-10-CM | POA: Diagnosis not present

## 2024-02-15 DIAGNOSIS — R1310 Dysphagia, unspecified: Secondary | ICD-10-CM | POA: Diagnosis present

## 2024-02-15 DIAGNOSIS — Z6841 Body Mass Index (BMI) 40.0 and over, adult: Secondary | ICD-10-CM

## 2024-02-15 DIAGNOSIS — Z430 Encounter for attention to tracheostomy: Secondary | ICD-10-CM | POA: Diagnosis not present

## 2024-02-15 DIAGNOSIS — Z79624 Long term (current) use of inhibitors of nucleotide synthesis: Secondary | ICD-10-CM

## 2024-02-15 DIAGNOSIS — I619 Nontraumatic intracerebral hemorrhage, unspecified: Secondary | ICD-10-CM | POA: Diagnosis not present

## 2024-02-15 DIAGNOSIS — R9389 Abnormal findings on diagnostic imaging of other specified body structures: Secondary | ICD-10-CM | POA: Diagnosis not present

## 2024-02-15 DIAGNOSIS — G935 Compression of brain: Secondary | ICD-10-CM | POA: Diagnosis present

## 2024-02-15 DIAGNOSIS — R918 Other nonspecific abnormal finding of lung field: Secondary | ICD-10-CM | POA: Diagnosis not present

## 2024-02-15 DIAGNOSIS — N179 Acute kidney failure, unspecified: Secondary | ICD-10-CM | POA: Diagnosis not present

## 2024-02-15 DIAGNOSIS — E870 Hyperosmolality and hypernatremia: Secondary | ICD-10-CM | POA: Diagnosis not present

## 2024-02-15 DIAGNOSIS — R509 Fever, unspecified: Secondary | ICD-10-CM | POA: Diagnosis not present

## 2024-02-15 DIAGNOSIS — Z452 Encounter for adjustment and management of vascular access device: Secondary | ICD-10-CM | POA: Diagnosis not present

## 2024-02-15 DIAGNOSIS — R69 Illness, unspecified: Secondary | ICD-10-CM | POA: Diagnosis not present

## 2024-02-15 DIAGNOSIS — J849 Interstitial pulmonary disease, unspecified: Secondary | ICD-10-CM | POA: Diagnosis not present

## 2024-02-15 DIAGNOSIS — J9602 Acute respiratory failure with hypercapnia: Secondary | ICD-10-CM | POA: Diagnosis present

## 2024-02-15 DIAGNOSIS — D6489 Other specified anemias: Secondary | ICD-10-CM | POA: Diagnosis not present

## 2024-02-15 DIAGNOSIS — I671 Cerebral aneurysm, nonruptured: Secondary | ICD-10-CM | POA: Diagnosis not present

## 2024-02-15 DIAGNOSIS — I6389 Other cerebral infarction: Secondary | ICD-10-CM | POA: Diagnosis not present

## 2024-02-15 DIAGNOSIS — Z7951 Long term (current) use of inhaled steroids: Secondary | ICD-10-CM

## 2024-02-15 DIAGNOSIS — I6601 Occlusion and stenosis of right middle cerebral artery: Secondary | ICD-10-CM | POA: Diagnosis not present

## 2024-02-15 DIAGNOSIS — G4453 Primary thunderclap headache: Secondary | ICD-10-CM | POA: Diagnosis not present

## 2024-02-15 DIAGNOSIS — J159 Unspecified bacterial pneumonia: Secondary | ICD-10-CM | POA: Diagnosis not present

## 2024-02-15 DIAGNOSIS — M810 Age-related osteoporosis without current pathological fracture: Secondary | ICD-10-CM | POA: Diagnosis present

## 2024-02-15 DIAGNOSIS — J9601 Acute respiratory failure with hypoxia: Secondary | ICD-10-CM | POA: Diagnosis present

## 2024-02-15 DIAGNOSIS — Z4682 Encounter for fitting and adjustment of non-vascular catheter: Secondary | ICD-10-CM | POA: Diagnosis not present

## 2024-02-15 DIAGNOSIS — R6511 Systemic inflammatory response syndrome (SIRS) of non-infectious origin with acute organ dysfunction: Secondary | ICD-10-CM | POA: Diagnosis not present

## 2024-02-15 DIAGNOSIS — G936 Cerebral edema: Secondary | ICD-10-CM | POA: Diagnosis not present

## 2024-02-15 DIAGNOSIS — R29726 NIHSS score 26: Secondary | ICD-10-CM | POA: Diagnosis not present

## 2024-02-15 DIAGNOSIS — G9349 Other encephalopathy: Secondary | ICD-10-CM | POA: Diagnosis present

## 2024-02-15 DIAGNOSIS — Z79899 Other long term (current) drug therapy: Secondary | ICD-10-CM

## 2024-02-15 DIAGNOSIS — R0989 Other specified symptoms and signs involving the circulatory and respiratory systems: Secondary | ICD-10-CM | POA: Diagnosis not present

## 2024-02-15 DIAGNOSIS — R140 Abdominal distension (gaseous): Secondary | ICD-10-CM | POA: Diagnosis not present

## 2024-02-15 DIAGNOSIS — S06310A Contusion and laceration of right cerebrum without loss of consciousness, initial encounter: Secondary | ICD-10-CM | POA: Diagnosis not present

## 2024-02-15 DIAGNOSIS — J9600 Acute respiratory failure, unspecified whether with hypoxia or hypercapnia: Secondary | ICD-10-CM | POA: Diagnosis not present

## 2024-02-15 HISTORY — PX: CRANIOTOMY: SHX93

## 2024-02-15 LAB — I-STAT ARTERIAL BLOOD GAS, ED
Acid-base deficit: 1 mmol/L (ref 0.0–2.0)
Bicarbonate: 25.8 mmol/L (ref 20.0–28.0)
Calcium, Ion: 1.13 mmol/L — ABNORMAL LOW (ref 1.15–1.40)
HCT: 33 % — ABNORMAL LOW (ref 36.0–46.0)
Hemoglobin: 11.2 g/dL — ABNORMAL LOW (ref 12.0–15.0)
O2 Saturation: 86 %
Patient temperature: 96.3
Potassium: 2.8 mmol/L — ABNORMAL LOW (ref 3.5–5.1)
Sodium: 137 mmol/L (ref 135–145)
TCO2: 27 mmol/L (ref 22–32)
pCO2 arterial: 50.8 mmHg — ABNORMAL HIGH (ref 32–48)
pH, Arterial: 7.307 — ABNORMAL LOW (ref 7.35–7.45)
pO2, Arterial: 53 mmHg — ABNORMAL LOW (ref 83–108)

## 2024-02-15 LAB — POCT I-STAT 7, (LYTES, BLD GAS, ICA,H+H)
Acid-Base Excess: 0 mmol/L (ref 0.0–2.0)
Acid-base deficit: 1 mmol/L (ref 0.0–2.0)
Bicarbonate: 24.8 mmol/L (ref 20.0–28.0)
Bicarbonate: 25.4 mmol/L (ref 20.0–28.0)
Calcium, Ion: 1.16 mmol/L (ref 1.15–1.40)
Calcium, Ion: 1.23 mmol/L (ref 1.15–1.40)
HCT: 29 % — ABNORMAL LOW (ref 36.0–46.0)
HCT: 31 % — ABNORMAL LOW (ref 36.0–46.0)
Hemoglobin: 9.9 g/dL — ABNORMAL LOW (ref 12.0–15.0)
Hemoglobin: 10.5 g/dL — ABNORMAL LOW (ref 12.0–15.0)
O2 Saturation: 100 %
O2 Saturation: 100 %
Patient temperature: 34
Patient temperature: 98
Potassium: 3.9 mmol/L (ref 3.5–5.1)
Potassium: 3.9 mmol/L (ref 3.5–5.1)
Sodium: 139 mmol/L (ref 135–145)
Sodium: 141 mmol/L (ref 135–145)
TCO2: 26 mmol/L (ref 22–32)
TCO2: 27 mmol/L (ref 22–32)
pCO2 arterial: 37.4 mmHg (ref 32–48)
pCO2 arterial: 42.1 mmHg (ref 32–48)
pH, Arterial: 7.416 (ref 7.35–7.45)
pH, Arterial: 7.387 (ref 7.35–7.45)
pO2, Arterial: 438 mmHg — ABNORMAL HIGH (ref 83–108)
pO2, Arterial: 259 mmHg — ABNORMAL HIGH (ref 83–108)

## 2024-02-15 LAB — COMPREHENSIVE METABOLIC PANEL WITH GFR
ALT: 12 U/L (ref 0–44)
AST: 20 U/L (ref 15–41)
Albumin: 3.6 g/dL (ref 3.5–5.0)
Alkaline Phosphatase: 67 U/L (ref 38–126)
Anion gap: 12 (ref 5–15)
BUN: 9 mg/dL (ref 6–20)
CO2: 22 mmol/L (ref 22–32)
Calcium: 8.7 mg/dL — ABNORMAL LOW (ref 8.9–10.3)
Chloride: 101 mmol/L (ref 98–111)
Creatinine, Ser: 0.85 mg/dL (ref 0.44–1.00)
GFR, Estimated: >60 mL/min (ref >60)
Glucose, Bld: 216 mg/dL — ABNORMAL HIGH (ref 70–99)
Potassium: 3.3 mmol/L — ABNORMAL LOW (ref 3.5–5.1)
Sodium: 135 mmol/L (ref 135–145)
Total Bilirubin: 0.5 mg/dL (ref 0.0–1.2)
Total Protein: 7.4 g/dL (ref 6.5–8.1)

## 2024-02-15 LAB — I-STAT CHEM 8, ED
BUN: 8 mg/dL (ref 6–20)
Calcium, Ion: 0.96 mmol/L — ABNORMAL LOW (ref 1.15–1.40)
Chloride: 104 mmol/L (ref 98–111)
Creatinine, Ser: 0.7 mg/dL (ref 0.44–1.00)
Glucose, Bld: 216 mg/dL — ABNORMAL HIGH (ref 70–99)
HCT: 37 % (ref 36.0–46.0)
Hemoglobin: 12.6 g/dL (ref 12.0–15.0)
Potassium: 3.1 mmol/L — ABNORMAL LOW (ref 3.5–5.1)
Sodium: 136 mmol/L (ref 135–145)
TCO2: 21 mmol/L — ABNORMAL LOW (ref 22–32)

## 2024-02-15 LAB — POCT I-STAT, CHEM 8
BUN: 8 mg/dL (ref 6–20)
Calcium, Ion: 1.16 mmol/L (ref 1.15–1.40)
Chloride: 100 mmol/L (ref 98–111)
Creatinine, Ser: 0.7 mg/dL (ref 0.44–1.00)
Glucose, Bld: 136 mg/dL — ABNORMAL HIGH (ref 70–99)
HCT: 27 % — ABNORMAL LOW (ref 36.0–46.0)
Hemoglobin: 9.2 g/dL — ABNORMAL LOW (ref 12.0–15.0)
Potassium: 3.4 mmol/L — ABNORMAL LOW (ref 3.5–5.1)
Sodium: 139 mmol/L (ref 135–145)
TCO2: 25 mmol/L (ref 22–32)

## 2024-02-15 LAB — DIFFERENTIAL
Abs Immature Granulocytes: 0.05 K/uL (ref 0.00–0.07)
Basophils Absolute: 0 K/uL (ref 0.0–0.1)
Basophils Relative: 0 %
Eosinophils Absolute: 0 K/uL (ref 0.0–0.5)
Eosinophils Relative: 0 %
Immature Granulocytes: 1 %
Lymphocytes Relative: 12 %
Lymphs Abs: 1 K/uL (ref 0.7–4.0)
Monocytes Absolute: 0.6 K/uL (ref 0.1–1.0)
Monocytes Relative: 7 %
Neutro Abs: 6.4 K/uL (ref 1.7–7.7)
Neutrophils Relative %: 80 %

## 2024-02-15 LAB — CBC
HCT: 35.5 % — ABNORMAL LOW (ref 36.0–46.0)
Hemoglobin: 11.1 g/dL — ABNORMAL LOW (ref 12.0–15.0)
MCH: 29.6 pg (ref 26.0–34.0)
MCHC: 31.3 g/dL (ref 30.0–36.0)
MCV: 94.7 fL (ref 80.0–100.0)
Platelets: 412 K/uL — ABNORMAL HIGH (ref 150–400)
RBC: 3.75 MIL/uL — ABNORMAL LOW (ref 3.87–5.11)
RDW: 12.7 % (ref 11.5–15.5)
WBC: 8 K/uL (ref 4.0–10.5)
nRBC: 0 % (ref 0.0–0.2)

## 2024-02-15 LAB — RAPID URINE DRUG SCREEN, HOSP PERFORMED
Amphetamines: NOT DETECTED
Barbiturates: NOT DETECTED
Benzodiazepines: NOT DETECTED
Cocaine: NOT DETECTED
Opiates: NOT DETECTED
Tetrahydrocannabinol: NOT DETECTED

## 2024-02-15 LAB — SODIUM
Sodium: 138 mmol/L (ref 135–145)
Sodium: 140 mmol/L (ref 135–145)

## 2024-02-15 LAB — PROTIME-INR
INR: 1 (ref 0.8–1.2)
Prothrombin Time: 14 s (ref 11.4–15.2)

## 2024-02-15 LAB — PROCALCITONIN: Procalcitonin: <0.1 ng/mL

## 2024-02-15 LAB — CBG MONITORING, ED: Glucose-Capillary: 268 mg/dL — ABNORMAL HIGH (ref 70–99)

## 2024-02-15 LAB — ETHANOL: Alcohol, Ethyl (B): <15 mg/dL (ref <15)

## 2024-02-15 LAB — GLUCOSE, CAPILLARY
Glucose-Capillary: 125 mg/dL — ABNORMAL HIGH (ref 70–99)
Glucose-Capillary: 125 mg/dL — ABNORMAL HIGH (ref 70–99)
Glucose-Capillary: 130 mg/dL — ABNORMAL HIGH (ref 70–99)
Glucose-Capillary: 148 mg/dL — ABNORMAL HIGH (ref 70–99)

## 2024-02-15 LAB — MRSA NEXT GEN BY PCR, NASAL: MRSA by PCR Next Gen: NOT DETECTED

## 2024-02-15 LAB — HCG, SERUM, QUALITATIVE: Preg, Serum: NEGATIVE

## 2024-02-15 LAB — APTT: aPTT: 26 s (ref 24–36)

## 2024-02-15 SURGERY — CRANIOTOMY HEMATOMA EVACUATION SUBDURAL
Anesthesia: General | Site: Head | Laterality: Right

## 2024-02-15 MED ORDER — FENTANYL CITRATE (PF) 250 MCG/5ML IJ SOLN
INTRAMUSCULAR | Status: AC
  Filled 2024-02-15: qty 5

## 2024-02-15 MED ORDER — BUPIVACAINE HCL (PF) 0.5 % IJ SOLN
INTRAMUSCULAR | Status: DC | PRN
  Administered 2024-02-15: 5 mL

## 2024-02-15 MED ORDER — THROMBIN 20000 UNITS EX SOLR
CUTANEOUS | Status: AC
Start: 2024-02-15 — End: 2024-02-15
  Filled 2024-02-15: qty 20000

## 2024-02-15 MED ORDER — HEMOSTATIC AGENTS (NO CHARGE) OPTIME
TOPICAL | Status: DC | PRN
  Administered 2024-02-15: 1 via TOPICAL

## 2024-02-15 MED ORDER — MIDAZOLAM HCL 2 MG/2ML IJ SOLN
INTRAMUSCULAR | Status: AC
  Filled 2024-02-15: qty 2

## 2024-02-15 MED ORDER — PROPOFOL 1000 MG/100ML IV EMUL
INTRAVENOUS | Status: AC
  Filled 2024-02-15: qty 100

## 2024-02-15 MED ORDER — DOCUSATE SODIUM 50 MG/5ML PO LIQD
100.0000 mg | Freq: Two times a day (BID) | ORAL | Status: DC | PRN
  Administered 2024-02-18 – 2024-02-23 (×2): 100 mg
  Filled 2024-02-15 (×2): qty 10

## 2024-02-15 MED ORDER — PROPOFOL 10 MG/ML IV BOLUS
INTRAVENOUS | Status: AC
  Filled 2024-02-15: qty 20

## 2024-02-15 MED ORDER — CHLORHEXIDINE GLUCONATE CLOTH 2 % EX PADS
6.0000 | MEDICATED_PAD | Freq: Every day | CUTANEOUS | Status: DC
  Administered 2024-02-15 – 2024-02-20 (×6): 6 via TOPICAL

## 2024-02-15 MED ORDER — ROCURONIUM BROMIDE 10 MG/ML (PF) SYRINGE
PREFILLED_SYRINGE | INTRAVENOUS | Status: AC
  Filled 2024-02-15: qty 20

## 2024-02-15 MED ORDER — LIDOCAINE-EPINEPHRINE 1 %-1:100000 IJ SOLN
INTRAMUSCULAR | Status: AC
Start: 2024-02-15 — End: 2024-02-15
  Filled 2024-02-15: qty 1

## 2024-02-15 MED ORDER — SODIUM CHLORIDE 0.9 % IV SOLN: INTRAVENOUS | Status: DC | PRN

## 2024-02-15 MED ORDER — IOHEXOL 350 MG/ML SOLN
75.0000 mL | Freq: Once | INTRAVENOUS | Status: AC | PRN
  Administered 2024-02-15: 75 mL via INTRAVENOUS

## 2024-02-15 MED ORDER — THROMBIN 5000 UNITS EX KIT
PACK | CUTANEOUS | Status: AC
  Filled 2024-02-15: qty 1

## 2024-02-15 MED ORDER — FAMOTIDINE 20 MG PO TABS
20.0000 mg | ORAL_TABLET | Freq: Two times a day (BID) | ORAL | Status: DC
  Administered 2024-02-15 – 2024-03-04 (×42): 20 mg
  Filled 2024-02-15 (×35): qty 1

## 2024-02-15 MED ORDER — BACITRACIN ZINC 500 UNIT/GM EX OINT
TOPICAL_OINTMENT | CUTANEOUS | Status: DC | PRN
  Administered 2024-02-15: 1 via TOPICAL

## 2024-02-15 MED ORDER — BACITRACIN ZINC 500 UNIT/GM EX OINT
TOPICAL_OINTMENT | CUTANEOUS | Status: AC
  Filled 2024-02-15: qty 28.35

## 2024-02-15 MED ORDER — ORAL CARE MOUTH RINSE: 15.0000 mL | OROMUCOSAL | Status: DC | PRN

## 2024-02-15 MED ORDER — DEXAMETHASONE SODIUM PHOSPHATE 10 MG/ML IJ SOLN
INTRAMUSCULAR | Status: DC | PRN
  Administered 2024-02-15: 10 mg via INTRAVENOUS

## 2024-02-15 MED ORDER — THROMBIN 5000 UNITS EX SOLR: OROMUCOSAL | Status: DC | PRN

## 2024-02-15 MED ORDER — THROMBIN 20000 UNITS EX SOLR: CUTANEOUS | Status: DC | PRN

## 2024-02-15 MED ORDER — CLEVIDIPINE BUTYRATE 0.5 MG/ML IV EMUL
INTRAVENOUS | Status: AC
  Filled 2024-02-15: qty 100

## 2024-02-15 MED ORDER — MANNITOL 25 % IV SOLN
INTRAVENOUS | Status: DC | PRN
  Administered 2024-02-15: 50 g via INTRAVENOUS

## 2024-02-15 MED ORDER — CLEVIDIPINE BUTYRATE 0.5 MG/ML IV EMUL
0.0000 mg/h | INTRAVENOUS | Status: DC
  Administered 2024-02-15: 12 mg/h via INTRAVENOUS
  Administered 2024-02-15: 24 mg/h via INTRAVENOUS
  Administered 2024-02-15: 18 mg/h via INTRAVENOUS
  Administered 2024-02-15: 2 mg/h via INTRAVENOUS
  Administered 2024-02-16: 20 mg/h via INTRAVENOUS
  Administered 2024-02-16: 22 mg/h via INTRAVENOUS
  Administered 2024-02-16: 20 mg/h via INTRAVENOUS
  Administered 2024-02-16: 18 mg/h via INTRAVENOUS
  Administered 2024-02-16: 16 mg/h via INTRAVENOUS
  Administered 2024-02-16: 22 mg/h via INTRAVENOUS
  Administered 2024-02-16 (×2): 20 mg/h via INTRAVENOUS
  Administered 2024-02-16: 16 mg/h via INTRAVENOUS
  Administered 2024-02-17: 29 mg/h via INTRAVENOUS
  Administered 2024-02-17: 24 mg/h via INTRAVENOUS
  Administered 2024-02-17: 30 mg/h via INTRAVENOUS
  Administered 2024-02-17: 22 mg/h via INTRAVENOUS
  Administered 2024-02-17: 30 mg/h via INTRAVENOUS
  Administered 2024-02-17: 20 mg/h via INTRAVENOUS
  Filled 2024-02-15 (×3): qty 100
  Filled 2024-02-15: qty 50
  Filled 2024-02-15: qty 100
  Filled 2024-02-15: qty 200
  Filled 2024-02-15: qty 50
  Filled 2024-02-15: qty 100
  Filled 2024-02-15: qty 200
  Filled 2024-02-15 (×5): qty 100
  Filled 2024-02-15: qty 50
  Filled 2024-02-15: qty 200
  Filled 2024-02-15: qty 50

## 2024-02-15 MED ORDER — POLYETHYLENE GLYCOL 3350 17 G PO PACK: 17.0000 g | PACK | Freq: Every day | ORAL | Status: DC | PRN

## 2024-02-15 MED ORDER — SODIUM CHLORIDE 23.4 % INJECTION (4 MEQ/ML) FOR IV ADMINISTRATION
120.0000 meq | Freq: Once | INTRAVENOUS | Status: AC
  Administered 2024-02-15: 120 meq via INTRAVENOUS
  Filled 2024-02-15: qty 30

## 2024-02-15 MED ORDER — LEVETIRACETAM (KEPPRA) 500 MG/5 ML ADULT IV PUSH
2000.0000 mg | Freq: Once | INTRAVENOUS | Status: AC
  Administered 2024-02-15: 2000 mg via INTRAVENOUS

## 2024-02-15 MED ORDER — ESMOLOL HCL 100 MG/10ML IV SOLN
INTRAVENOUS | Status: AC
  Filled 2024-02-15: qty 10

## 2024-02-15 MED ORDER — ONDANSETRON HCL 4 MG/2ML IJ SOLN
4.0000 mg | Freq: Once | INTRAMUSCULAR | Status: AC
  Administered 2024-02-15: 4 mg via INTRAVENOUS
  Filled 2024-02-15: qty 2

## 2024-02-15 MED ORDER — 0.9 % SODIUM CHLORIDE (POUR BTL) OPTIME
TOPICAL | Status: DC | PRN
  Administered 2024-02-15 (×2): 1000 mL

## 2024-02-15 MED ORDER — FENTANYL CITRATE (PF) 250 MCG/5ML IJ SOLN
INTRAMUSCULAR | Status: DC | PRN
  Administered 2024-02-15 (×2): 100 ug via INTRAVENOUS

## 2024-02-15 MED ORDER — LIDOCAINE-EPINEPHRINE 1 %-1:100000 IJ SOLN
INTRAMUSCULAR | Status: DC | PRN
  Administered 2024-02-15: 5 mL via INTRADERMAL

## 2024-02-15 MED ORDER — DEXAMETHASONE SODIUM PHOSPHATE 10 MG/ML IJ SOLN
INTRAMUSCULAR | Status: AC
  Filled 2024-02-15: qty 1

## 2024-02-15 MED ORDER — ORAL CARE MOUTH RINSE
15.0000 mL | OROMUCOSAL | Status: DC
  Administered 2024-02-15 – 2024-03-04 (×254): 15 mL via OROMUCOSAL

## 2024-02-15 MED ORDER — ROCURONIUM BROMIDE 10 MG/ML (PF) SYRINGE
PREFILLED_SYRINGE | INTRAVENOUS | Status: DC | PRN
  Administered 2024-02-15 (×2): 20 mg via INTRAVENOUS
  Administered 2024-02-15: 60 mg via INTRAVENOUS

## 2024-02-15 MED ORDER — SODIUM CHLORIDE 3 % IV SOLN
INTRAVENOUS | Status: DC
  Filled 2024-02-15: qty 1000
  Filled 2024-02-15 (×5): qty 500

## 2024-02-15 MED ORDER — SODIUM CHLORIDE 0.9 % IV SOLN
2.0000 g | INTRAVENOUS | Status: DC
  Administered 2024-02-15: 2 g via INTRAVENOUS
  Filled 2024-02-15 (×3): qty 20

## 2024-02-15 MED ORDER — FENTANYL 2500MCG IN NS 250ML (10MCG/ML) PREMIX INFUSION
0.0000 ug/h | INTRAVENOUS | Status: DC
  Administered 2024-02-15: 25 ug/h via INTRAVENOUS
  Administered 2024-02-16 – 2024-02-17 (×2): 100 ug/h via INTRAVENOUS
  Filled 2024-02-15 (×3): qty 250

## 2024-02-15 MED ORDER — INSULIN ASPART 100 UNIT/ML IJ SOLN
0.0000 [IU] | INTRAMUSCULAR | Status: DC
  Administered 2024-02-15 – 2024-02-22 (×22): 2 [IU] via SUBCUTANEOUS

## 2024-02-15 MED ORDER — DOCUSATE SODIUM 100 MG PO CAPS: 100.0000 mg | ORAL_CAPSULE | Freq: Two times a day (BID) | ORAL | Status: DC | PRN

## 2024-02-15 MED ORDER — BUPIVACAINE HCL (PF) 0.5 % IJ SOLN
INTRAMUSCULAR | Status: AC
  Filled 2024-02-15: qty 30

## 2024-02-15 MED ORDER — PROPOFOL 1000 MG/100ML IV EMUL
0.0000 ug/kg/min | INTRAVENOUS | Status: DC
  Administered 2024-02-15: 50 ug/kg/min via INTRAVENOUS
  Administered 2024-02-15: 20 ug/kg/min via INTRAVENOUS
  Administered 2024-02-15 – 2024-02-17 (×11): 50 ug/kg/min via INTRAVENOUS
  Administered 2024-02-17: 30 ug/kg/min via INTRAVENOUS
  Administered 2024-02-17: 20 ug/kg/min via INTRAVENOUS
  Administered 2024-02-17 (×2): 50 ug/kg/min via INTRAVENOUS
  Administered 2024-02-18: 30 ug/kg/min via INTRAVENOUS
  Administered 2024-02-18: 10 ug/kg/min via INTRAVENOUS
  Administered 2024-02-18: 30 ug/kg/min via INTRAVENOUS
  Administered 2024-02-18: 15 ug/kg/min via INTRAVENOUS
  Administered 2024-02-20 – 2024-02-21 (×2): 10 ug/kg/min via INTRAVENOUS
  Filled 2024-02-15 (×23): qty 100

## 2024-02-15 SURGICAL SUPPLY — 70 items
BAG COUNTER SPONGE SURGICOUNT (BAG) ×1 IMPLANT
BAND RUBBER #18 3X1/16 STRL (MISCELLANEOUS) IMPLANT
BENZOIN TINCTURE PRP APPL 2/3 (GAUZE/BANDAGES/DRESSINGS) IMPLANT
BLADE CLIPPER SURG (BLADE) ×1 IMPLANT
BNDG GAUZE DERMACEA FLUFF 4 (GAUZE/BANDAGES/DRESSINGS) IMPLANT
BUR ACORN 6.0 PRECISION (BURR) IMPLANT
BUR MATCHSTICK NEURO 3.0 LAGG (BURR) IMPLANT
BUR ROUND FLUTED 4 SOFT TCH (BURR) IMPLANT
BUR ROUND FLUTED 5 RND (BURR) IMPLANT
BUR SPIRAL ROUTER 2.3 (BUR) IMPLANT
CANISTER SUCTION 3000ML PPV (SUCTIONS) ×1 IMPLANT
CATH ROBINSON RED A/P 14FR (CATHETERS) IMPLANT
CLIP ANEURY TI PERM STD CVD 8M (Clip) IMPLANT
CLIP TI MEDIUM 6 (CLIP) IMPLANT
COVER BURR HOLE 7 (Orthopedic Implant) IMPLANT
COVER BURR HOLE UNIV 10 (Orthopedic Implant) IMPLANT
DRAPE HALF SHEET 40X57 (DRAPES) IMPLANT
DRAPE MICROSCOPE LEICA 54X105 (DRAPES) IMPLANT
DRAPE NEUROLOGICAL W/INCISE (DRAPES) ×1 IMPLANT
DRAPE SURG 17X23 STRL (DRAPES) IMPLANT
DRAPE WARM FLUID 44X44 (DRAPES) ×1 IMPLANT
DRSG TELFA 3X8 NADH STRL (GAUZE/BANDAGES/DRESSINGS) IMPLANT
DURAPREP 6ML APPLICATOR 50/CS (WOUND CARE) ×1 IMPLANT
ELECTRODE REM PT RTRN 9FT ADLT (ELECTROSURGICAL) ×1 IMPLANT
EVACUATOR 1/8 PVC DRAIN (DRAIN) IMPLANT
EVACUATOR SILICONE 100CC (DRAIN) IMPLANT
FORCEPS BIPOLAR SPETZLER 8 1.0 (NEUROSURGERY SUPPLIES) IMPLANT
GAUZE 4X4 16PLY ~~LOC~~+RFID DBL (SPONGE) IMPLANT
GAUZE SPONGE 4X4 12PLY STRL (GAUZE/BANDAGES/DRESSINGS) ×1 IMPLANT
GLOVE BIO SURGEON STRL SZ7 (GLOVE) IMPLANT
GLOVE BIOGEL PI IND STRL 7.5 (GLOVE) ×1 IMPLANT
GLOVE ECLIPSE 7.0 STRL STRAW (GLOVE) ×2 IMPLANT
GLOVE EXAM NITRILE XL STR (GLOVE) IMPLANT
GOWN STRL REUS W/ TWL LRG LVL3 (GOWN DISPOSABLE) ×2 IMPLANT
GOWN STRL REUS W/ TWL XL LVL3 (GOWN DISPOSABLE) IMPLANT
GOWN STRL REUS W/TWL 2XL LVL3 (GOWN DISPOSABLE) IMPLANT
HEMOSTAT POWDER KIT SURGIFOAM (HEMOSTASIS) ×1 IMPLANT
HEMOSTAT SURGICEL 2X14 (HEMOSTASIS) IMPLANT
HOOK DURA 1/2IN (MISCELLANEOUS) ×1 IMPLANT
IV NS IRRIG 3000ML ARTHROMATIC (IV SOLUTION) IMPLANT
KIT BASIN OR (CUSTOM PROCEDURE TRAY) ×1 IMPLANT
KIT TURNOVER KIT B (KITS) ×1 IMPLANT
NDL HYPO 22X1.5 SAFETY MO (MISCELLANEOUS) ×1 IMPLANT
NEEDLE HYPO 22X1.5 SAFETY MO (MISCELLANEOUS) ×1 IMPLANT
NS IRRIG 1000ML POUR BTL (IV SOLUTION) ×1 IMPLANT
PACK BATTERY CMF DISP FOR DVR (ORTHOPEDIC DISPOSABLE SUPPLIES) IMPLANT
PACK CRANIOTOMY CUSTOM (CUSTOM PROCEDURE TRAY) ×1 IMPLANT
PAD MAGNETIC INSTR ST 16X20 (MISCELLANEOUS) IMPLANT
PATTIES SURGICAL .5 X3 (DISPOSABLE) IMPLANT
PLATE BONE 12 2H TARGET XL (Plate) IMPLANT
SCREW UNIII AXS SD 1.5X4 (Screw) IMPLANT
SET CYSTO W/LG BORE CLAMP LF (SET/KITS/TRAYS/PACK) IMPLANT
SPONGE NEURO XRAY DETECT 1X3 (DISPOSABLE) IMPLANT
SPONGE SURGIFOAM ABS GEL 100 (HEMOSTASIS) ×1 IMPLANT
STAPLER SKIN PROX 35W (STAPLE) ×1 IMPLANT
STOCKINETTE 6 STRL (DRAPES) ×1 IMPLANT
SUT 3-0 BLK 1X30 PSL (SUTURE) IMPLANT
SUT ETHILON 3 0 PS 1 (SUTURE) IMPLANT
SUT NURALON 4 0 TR CR/8 (SUTURE) ×3 IMPLANT
SUT VIC AB 0 CT1 18XCR BRD8 (SUTURE) ×2 IMPLANT
SUT VIC AB 3-0 SH 8-18 (SUTURE) ×2 IMPLANT
SYR BULB IRRIG 60ML STRL (SYRINGE) IMPLANT
TAPE CLOTH 1X10 TAN NS (GAUZE/BANDAGES/DRESSINGS) ×1 IMPLANT
TOWEL GREEN STERILE (TOWEL DISPOSABLE) ×1 IMPLANT
TOWEL GREEN STERILE FF (TOWEL DISPOSABLE) ×1 IMPLANT
TRAY FOLEY MTR SLVR 16FR STAT (SET/KITS/TRAYS/PACK) ×1 IMPLANT
TUBE CONNECTING 12X1/4 (SUCTIONS) ×1 IMPLANT
TUBING FEATHERFLOW (TUBING) IMPLANT
UNDERPAD 30X36 HEAVY ABSORB (UNDERPADS AND DIAPERS) ×1 IMPLANT
WATER STERILE IRR 1000ML POUR (IV SOLUTION) ×1 IMPLANT

## 2024-02-15 NOTE — Progress Notes (Signed)
 RT note. Patient transported to OR on ventilator with out any complications, on 100% +8

## 2024-02-15 NOTE — Progress Notes (Signed)
 RT note. ABG collected & peep turned to 8 per results/MD.

## 2024-02-15 NOTE — Consult Note (Signed)
 Chief Complaint   Chief Complaint  Patient presents with   unresponsive    History of Present Illness  Katie Knight is a 48 y.o. female brought in by EMS after sudden onset of HA around 0230 this morning, followed by N/V and unresponsiveness at home. Upon arrival she was intubated for airway protection and CT demonstrates a large right temporal hematoma with significant mass effect. Neurosurgical consult was therefore requested. She was given bolus dose of 23.4% HTS in the ED.  Of note, pt has a history of interstitial lung disease on cellcept, and medically controlled HTN. No hx of DM, heart disease or previous stroke. No cancer history. She is not on AP/AC. She is a non-smoker.  Past Medical History   Past Medical History:  Diagnosis Date   Arthritis    Asthma    High blood pressure    Hyperlipidemia    Joint pain    Osteoporosis    SOB (shortness of breath)    Vitamin D  deficiency     Past Surgical History   Past Surgical History:  Procedure Laterality Date   BRONCHIAL BIOPSY  04/30/2022   Procedure: BRONCHIAL BIOPSIES;  Surgeon: Annella Donnice SAUNDERS, MD;  Location: [facility];  Service: Endoscopy;;   BRONCHIAL NEEDLE ASPIRATION BIOPSY  04/30/2022   Procedure: BRONCHIAL NEEDLE ASPIRATION BIOPSIES;  Surgeon: Annella Donnice SAUNDERS, MD;  Location: [facility];  Service: Endoscopy;;   BRONCHIAL WASHINGS  04/30/2022   Procedure: BRONCHIAL WASHINGS;  Surgeon: Annella Donnice SAUNDERS, MD;  Location: [facility];  Service: Endoscopy;;   DILATION AND CURETTAGE OF UTERUS     ENDOBRONCHIAL ULTRASOUND N/A 04/30/2022   Procedure: ENDOBRONCHIAL ULTRASOUND;  Surgeon: Annella Donnice SAUNDERS, MD;  Location: [facility];  Service: Endoscopy;  Laterality: N/A;   INTERCOSTAL NERVE BLOCK Right 07/26/2023   Procedure: INTERCOSTAL NERVE BLOCK;  Surgeon: Kerrin Elspeth BROCKS, MD;  Location: [facility];  Service: Thoracic;  Laterality: Right;   LUNG BIOPSY Right 07/26/2023   Procedure: LUNG  BIOPSY;  Surgeon: Kerrin Elspeth BROCKS, MD;  Location: [facility] OR;  Service: Thoracic;  Laterality: Right;   VIDEO BRONCHOSCOPY  04/30/2022   Procedure: VIDEO BRONCHOSCOPY WITHOUT FLUORO;  Surgeon: Annella Donnice SAUNDERS, MD;  Location: [facility];  Service: Endoscopy;;    Social History   Social History   Tobacco Use   Smoking status: Never    Passive exposure: Past   Smokeless tobacco: Never  Vaping Use   Vaping status: Never Used  Substance Use Topics   Alcohol  use: Yes    Comment: rarely   Drug use: Never    Medications   Prior to Admission medications   Medication Sig Start Date End Date Taking? Authorizing Provider  acetaminophen  (TYLENOL ) 650 MG CR tablet Take 1,300 mg by mouth 2 (two) times daily as needed for pain.    [provider]  budesonide -formoterol  (SYMBICORT ) 160-4.5 MCG/ACT inhaler Inhale 2 puffs into the lungs in the morning and at bedtime. Patient taking differently: Inhale 2 puffs into the lungs 2 (two) times daily as needed (shortness of breath). 01/01/23   Hunsucker, Donnice SAUNDERS, MD  gabapentin  (NEURONTIN ) 300 MG capsule Take 1 tablet by mouth at bedtime for 3 days. Then increase 1 tablet twice a day. Patient taking differently: Take 300 mg by mouth 2 (two) times daily as needed (pain, neuropathy). 08/06/23   Kerrin Elspeth BROCKS, MD  LINZESS  145 MCG CAPS capsule Take 145 mcg by mouth daily before breakfast. 12/07/21   [provider]  mycophenolate (CELLCEPT)  500 MG tablet Take 1,500 mg by mouth 2 (two) times daily.    [provider]  ondansetron  (ZOFRAN ) 4 MG tablet Take 1 tablet (4 mg total) by mouth every 6 (six) hours. 11/22/23   Laurice Maude BROCKS, MD  ondansetron  (ZOFRAN -ODT) 4 MG disintegrating tablet Take 1 tablet (4 mg total) by mouth every 8 (eight) hours as needed for nausea or vomiting. 12/14/23   Freddi Hamilton, MD  oxyCODONE -acetaminophen  (PERCOCET/ROXICET) 5-325 MG tablet Take 1 tablet by mouth every 6 (six) hours as needed for  severe pain (pain score 7-10). 12/14/23   Freddi Hamilton, MD  PROAIR  HFA 108 (90 Base) MCG/ACT inhaler Inhale 2 puffs into the lungs every 4 (four) hours as needed for wheezing or shortness of breath. 12/22/21   O'NealDarryle Ned, MD  tamsulosin  (FLOMAX ) 0.4 MG CAPS capsule Take 1 capsule (0.4 mg total) by mouth daily. 12/15/23   Freddi Hamilton, MD    Allergies  No Known Allergies  Review of Systems  ROS  Neurologic Exam  No eye opening to pain Right pupil 4mm, minimally reactive Left pupil 2mm, sluggish Not breathing over vent Extensor posturing BUE/BLE to central pain  Imaging  CTH and CTA both personally reviewed demonstrating large right temporal hematoma with associate R->L MLS and effacement of the ambient cistern. CTA demonstrates significant attenuation of the supraclinoid RICA and beaded-string appearance of the right M1 segment which is displaced anteriorly and superiorly. There is a small spot of contrast within the hematoma suggesting possible active contrast extrav. I do not identify and aneurysm or AVM.  Impression  - 48 y.o. female with spontaneous non-dominant temporal hemorrhage of unclear etiology, with severe mass effect and herniation syndrome. Given her young age and cortical based hemorrhage, I think operative evacuation of the hematoma for relief of mass-effect as a life-saving procedure is indicated.  Plan  - Will proceed with right temporal craniotomy for evacuation of hematoma.  I have reviewed the situation with the patient's wife. I told her I suspect that without surgery she will likely succumb from the mass effect, while surgery as a last-ditch effort may allow her to survive this hemorrhage although this is certainly not a guarantee. We discussed potential risks of surgery to include further bleeding, infection, death, and potential outcomes of surgery. All her questions were answered and she provided consent to proceed as above.

## 2024-02-15 NOTE — Progress Notes (Signed)
 RT note.  Patient transported to CT and back on ventilator without any complications.   02/15/24 0710  Vent Select  $ Ventilator Initial/Subsequent  Initial  $ Transport Ventilator  Yes  Invasive or Noninvasive Invasive  Adult Vent Y  Airway 7.5 mm  Placement Date/Time: 02/15/24 0711   ETT Types: Endobronchial  Size (mm): 7.5 mm  Cuffed: Cuffed  Secured at (cm): 23 cm  Secured at (cm) 23 cm  Measured From Lips  Secured Location Right  Secured By English as a second language teacher No  Tube Holder Repositioned Yes  Prone position No  Cuff Pressure (cm H2O) Clear OR 27-39 CmH2O  Site Condition Other (Comment) (vomit)  Adult IBW/VT Calculations  Height 5' 7 (1.702 m)  IBW/kg (Calculated) Female 66.1 kg  Low Range Vt 6cc/kg FEMALE 396.6 mL  Adult Moderate Range Vt 8cc/kg MA 528.8 mL  Adult High Range Vt 10cc/kg FEMALE 661 mL  IBW/kg (Calculated) FEMALE 61.6 kg  Low Range Vt 6cc/kg FEMALE 369.6 mL  Adult Moderate Range vt 8cc/kg FEMALE 492.8 mL  Adult High Range Vt 10cc/kg FEMALE 616 mL  Adult Ventilator Settings  Vent Type Servo i  Humidity HME  Vent Mode PRVC  Vt Set (S)  500 mL  Set Rate 18 bmp  FiO2 (%) 100 %  I Time 0.8 Sec(s)  PEEP 5 cmH20  Adult Ventilator Measurements  Peak Airway Pressure 19 L/min  Mean Airway Pressure 8 cmH20  Resp Rate Spontaneous 2 br/min  Resp Rate Total 20 br/min  Exhaled Vt 540 mL  Measured Ve 9.5 L  I:E Ratio Measured 1:3.1  Auto PEEP 0 cmH20  Total PEEP 5 cmH20  SpO2 100 %  Adult Ventilator Alarms  Alarms On Y  Ve High Alarm 25 L/min  Ve Low Alarm 5 L/min  Resp Rate High Alarm 40 br/min  Resp Rate Low Alarm 10  PEEP Low Alarm 3 cmH2O  Press High Alarm 40 cmH2O  T Apnea 20 sec(s)  Breath Sounds  Bilateral Breath Sounds Rhonchi;Diminished

## 2024-02-15 NOTE — Anesthesia Preprocedure Evaluation (Addendum)
 Anesthesia Evaluation  Patient identified by MRN, date of birth, ID band Patient unresponsive    Reviewed: Allergy & Precautions, Patient's Chart, lab work & pertinent test results, Unable to perform ROS - Chart review onlyPreop documentation limited or incomplete due to emergent nature of procedure.  Airway Mallampati: Intubated       Dental   Pulmonary  Intubated in ED Severe interstitial lung disease   breath sounds clear to auscultation       Cardiovascular hypertension,  Rhythm:Regular Rate:Tachycardia     Neuro/Psych headache, right sided facial droop and vomiting. Last known well @ 10pm last night. Pt unresponsive during triage CTA showed large intraparenchymal hemorrhage on the right side with some subdural subarachnoid hemorrhage    GI/Hepatic   Endo/Other  BMI 38  Renal/GU K+ 2.8     Musculoskeletal  (+) Arthritis ,    Abdominal   Peds  Hematology Hb 11.2, plt 412k   Anesthesia Other Findings   Reproductive/Obstetrics                              Anesthesia Physical Anesthesia Plan  ASA: 4 and emergent  Anesthesia Plan: General   Post-op Pain Management:    Induction: Intravenous and Inhalational  PONV Risk Score and Plan: 2 and Treatment may vary due to age or medical condition  Airway Management Planned: Oral ETT  Additional Equipment: Arterial line  Intra-op Plan:   Post-operative Plan: Post-operative intubation/ventilation  Informed Consent:      Only emergency history available  Plan Discussed with: CRNA and Surgeon  Anesthesia Plan Comments:          Anesthesia Quick Evaluation

## 2024-02-15 NOTE — Transfer of Care (Signed)
 Immediate Anesthesia Transfer of Care Note  Patient: Katie Knight  Procedure(s) Performed: CRANIOTOMY HEMATOMA EVACUATION SUBDURAL (Right: Head)  Patient Location: ICU  Anesthesia Type:General  Level of Consciousness: sedated, unresponsive, and Patient remains intubated per anesthesia plan  Airway & Oxygen  Therapy: Patient remains intubated per anesthesia plan and Patient placed on Ventilator (see vital sign flow sheet for setting)  Post-op Assessment: Report given to RN and Post -op Vital signs reviewed and stable  Post vital signs: Reviewed and stable  Last Vitals:  Vitals Value Taken Time  BP 151/95 02/15/24 12:00  Temp    Pulse    Resp 18 02/15/24 12:03  SpO2    Vitals shown include unfiled device data.  Last Pain:  Vitals:   02/15/24 0740  TempSrc: Temporal         Complications: No notable events documented.

## 2024-02-15 NOTE — Progress Notes (Signed)
 eLink Physician-Brief Progress Note Patient Name: Kolbee Stallman DOB: 03-Oct-1975 MRN: [id number]   Date of Service  02/15/2024  HPI/Events of Note  48 y.o. female with history of interstitial lung disease on CellCept, hypertension, hyperlipidemia, asthma admitted for right temporal ICH with cerebral edema and brain herniation.  Currently on maximal Cleviprex  at 21 mcg with sustained blood pressure above 150.  eICU Interventions  Although the data is sparse, Cleviprex  has been used up to a dose of 30.  Continue with short acting agents where possible.     Intervention Category Intermediate Interventions: Hypertension - evaluation and management  Lillar Bianca 02/15/2024, 8:52 PM

## 2024-02-15 NOTE — ED Provider Notes (Signed)
 [place] EMERGENCY DEPARTMENT AT [facility] Provider Note   CSN: [id number] Arrival date & time: 02/15/24  9360     Patient presents with: unresponsive   Katie Knight is a 48 y.o. female.   Level 5 caveat for acuity of condition.  Patient presents by EMS with headache, nausea, vomiting unresponsive.  Initial call was for headache.  Patient became unresponsive in front of EMS.  Did not lose pulses.  Her last normal was apparently 10 PM last night.  Was initially speaking for EMS but not moving her left side.  Assisted ventilations and route.  They report multiple episodes of nausea and vomiting at the scene.  No known anticoagulation history.  Patient unable to give any history.  She is unresponsive.  The history is provided by the EMS personnel. The history is limited by the condition of the patient.       Prior to Admission medications   Medication Sig Start Date End Date Taking? Authorizing Provider  acetaminophen  (TYLENOL ) 650 MG CR tablet Take 1,300 mg by mouth 2 (two) times daily as needed for pain.    [provider]  budesonide -formoterol  (SYMBICORT ) 160-4.5 MCG/ACT inhaler Inhale 2 puffs into the lungs in the morning and at bedtime. Patient taking differently: Inhale 2 puffs into the lungs 2 (two) times daily as needed (shortness of breath). 01/01/23   Hunsucker, Donnice SAUNDERS, MD  gabapentin  (NEURONTIN ) 300 MG capsule Take 1 tablet by mouth at bedtime for 3 days. Then increase 1 tablet twice a day. Patient taking differently: Take 300 mg by mouth 2 (two) times daily as needed (pain, neuropathy). 08/06/23   Kerrin Elspeth BROCKS, MD  LINZESS  145 MCG CAPS capsule Take 145 mcg by mouth daily before breakfast. 12/07/21   [provider]  mycophenolate (CELLCEPT) 500 MG tablet Take 1,500 mg by mouth 2 (two) times daily.    [provider]  ondansetron  (ZOFRAN ) 4 MG tablet Take 1 tablet (4 mg total) by mouth every 6 (six) hours. 11/22/23   Laurice Maude BROCKS, MD  ondansetron  (ZOFRAN -ODT) 4 MG disintegrating tablet Take 1 tablet (4 mg total) by mouth every 8 (eight) hours as needed for nausea or vomiting. 12/14/23   Freddi Hamilton, MD  oxyCODONE -acetaminophen  (PERCOCET/ROXICET) 5-325 MG tablet Take 1 tablet by mouth every 6 (six) hours as needed for severe pain (pain score 7-10). 12/14/23   Freddi Hamilton, MD  PROAIR  HFA 108 (90 Base) MCG/ACT inhaler Inhale 2 puffs into the lungs every 4 (four) hours as needed for wheezing or shortness of breath. 12/22/21   O'NealDarryle Ned, MD  tamsulosin  (FLOMAX ) 0.4 MG CAPS capsule Take 1 capsule (0.4 mg total) by mouth daily. 12/15/23   Freddi Hamilton, MD    Allergies: Patient has no known allergies.    Review of Systems  Unable to perform ROS: Mental status change    Updated Vital Signs BP (!) 190/90   Pulse (!) 58   Ht 5' 4 (1.626 m)   Wt 110 kg   SpO2 100%   BMI 41.63 kg/m   Physical Exam Constitutional:      General: She is in acute distress.     Appearance: She is obese. She is ill-appearing and toxic-appearing.     Comments: Unresponsive, assisted ventilation  Cardiovascular:     Rate and Rhythm: Normal rate and regular rhythm.  Pulmonary:     Effort: No respiratory distress.     Comments: Equal breath sounds with bagging Abdominal:  Tenderness: There is no abdominal tenderness.  Musculoskeletal:        General: No swelling or tenderness. Normal range of motion.  Neurological:     Comments: Obtunded, does not speak, responds to pain in the left side only. Decorticate posturing of bilateral arms and legs.      (all labs ordered are listed, but only abnormal results are displayed) Labs Reviewed  CBC - Abnormal; Notable for the following components:      Result Value   RBC 3.75 (*)    Hemoglobin 11.1 (*)    HCT 35.5 (*)    Platelets 412 (*)    All other components within normal limits  I-STAT CHEM 8, ED - Abnormal; Notable for the following components:   Potassium  3.1 (*)    Glucose, Bld 216 (*)    Calcium, Ion 0.96 (*)    TCO2 21 (*)    All other components within normal limits  CBG MONITORING, ED - Abnormal; Notable for the following components:   Glucose-Capillary 268 (*)    All other components within normal limits  DIFFERENTIAL  ETHANOL  COMPREHENSIVE METABOLIC PANEL WITH GFR  RAPID URINE DRUG SCREEN, HOSP PERFORMED  PROTIME-INR  APTT  HCG, SERUM, QUALITATIVE  SODIUM  SODIUM  SODIUM  I-STAT ARTERIAL BLOOD GAS, ED    EKG: EKG Interpretation Date/Time:  Saturday February 15 2024 07:16:00 EDT Ventricular Rate:  57 PR Interval:  138 QRS Duration:  96 QT Interval:  461 QTC Calculation: 449 R Axis:   74  Text Interpretation: Sinus rhythm Ventricular premature complex Nonspecific T abnormalities, anterior leads No significant change was found Confirmed by Carita Senior [phone]) on 02/15/2024 7:39:20 AM  Radiology: CT HEAD CODE STROKE WO CONTRAST Result Date: 02/15/2024 CLINICAL DATA:  Code stroke. 48 year old female neurologic deficit. Intubated. EXAM: CT HEAD WITHOUT CONTRAST TECHNIQUE: Contiguous axial images were obtained from the base of the skull through the vertex without intravenous contrast. RADIATION DOSE REDUCTION: This exam was performed according to the departmental dose-optimization program which includes automated exposure control, adjustment of the mA and/or kV according to patient size and/or use of iterative reconstruction technique. COMPARISON:  None Available. FINDINGS: Brain: Large and hyperdense intra-axial hemorrhage throughout the right temporal lobe. Substantial superimposed extension into the subdural space suspected including along the right tentorium, at the floor of the right middle cranial fossa. See coronal images 27, 32. Additionally, there is combined hyperdense and isodense widespread right hemispheric subdural hematoma which is up to 7 mm thickness on the coronal images, especially along the anterior right  frontal convexity. This is subtle on axial images. See coronal image 47. Large and lobulated intra-axial hematoma encompasses 73 x 47 by 44 mm (AP by transverse by CC) for an estimated intra-axial volume of 75 mL. Additionally, a comparatively small volume of subarachnoid hemorrhage is demonstrated in the posterior fossa, especially the prepontine cistern. And furthermore, possible small acute Duret hemorrhagein the brainstem on series 2, image 10. Confluent surrounding right temporal lobe edema. Intracranial mass effect with leftward midline shift of 9 mm. Early trapping of the left lateral ventricle, dilated left temporal horn. Uncal herniation. All basilar cisterns are partially effaced. Small left basal ganglia vascular calcification. Vascular: No suspicious intracranial vascular hyperdensity. Skull: Intact. Sinuses/Orbits: Visualized paranasal sinuses and mastoids are stable and well aerated. Other: Mildly Disconjugate gaze. Visualized scalp soft tissues are within normal limits. ASPECTS [facility] Stroke Program Early CT Score) Total score (0-10 with 10 being normal): Not applicable, acute hemorrhage. IMPRESSION:  1. Severe acute intracranial hemorrhage, probably originated as intra-axial bleed into the right temporal lobe, with large hematoma there estimated at 75 mL. But also substantial extension into the right subdural space (up to 7 mm thickness), and subarachnoid space extension. Possible Duret hemorrhagein the right brainstem also. 2. Intracranial mass effect with widespread cerebral edema. Right uncal herniation. Trapped left lateral ventricle. Effaced basilar cisterns. Electronically Signed   By: VEAR Hurst M.D.   On: 02/15/2024 07:10     Procedure Name: Intubation Date/Time: 02/15/2024 7:01 AM  Performed by: Carita Senior, MDPre-anesthesia Checklist: Patient identified, Patient being monitored, Emergency Drugs available, Timeout performed and Suction available Oxygen  Delivery Method:  Non-rebreather mask Preoxygenation: Pre-oxygenation with 100% oxygen  Induction Type: Rapid sequence Ventilation: Mask ventilation without difficulty Laryngoscope Size: Glidescope and 4 Tube size: 7.5 mm Number of attempts: 1 Airway Equipment and Method: Video-laryngoscopy and Stylet Placement Confirmation: ETT inserted through vocal cords under direct vision, CO2 detector and Breath sounds checked- equal and bilateral Secured at: 25 cm Tube secured with: ETT holder Dental Injury: Teeth and Oropharynx as per pre-operative assessment  Difficulty Due To: Difficult Airway- due to large tongue Future Recommendations: Recommend- induction with short-acting agent, and alternative techniques readily available    .Critical Care  Performed by: Carita Senior, MD Authorized by: Carita Senior, MD   Critical care provider statement:    Critical care time (minutes):  60   Critical care time was exclusive of:  Separately billable procedures and treating other patients   Critical care was necessary to treat or prevent imminent or life-threatening deterioration of the following conditions:  CNS failure or compromise and respiratory failure   Critical care was time spent personally by me on the following activities:  Development of treatment plan with patient or surrogate, discussions with consultants, evaluation of patient's response to treatment, examination of patient, ordering and review of laboratory studies, ordering and review of radiographic studies, ordering and performing treatments and interventions, pulse oximetry, re-evaluation of patient's condition, review of old charts, blood draw for specimens and obtaining history from patient or surrogate   I assumed direction of critical care for this patient from another provider in my specialty: no     Care discussed with: admitting provider      Medications Ordered in the ED  clevidipine  (CLEVIPREX ) infusion 0.5 mg/mL (has no administration  in time range)  propofol  (DIPRIVAN ) 1000 MG/100ML infusion (has no administration in time range)  fentaNYL  in NS (4mcg/ml) infusion-PREMIX (has no administration in time range)  propofol  (DIPRIVAN ) 1000 MG/100ML infusion (has no administration in time range)  clevidipine  (CLEVIPREX ) 0.5 MG/ML infusion (has no administration in time range)  ondansetron  (ZOFRAN ) injection 4 mg (has no administration in time range)                                    Medical Decision Making Amount and/or Complexity of Data Reviewed Labs: ordered. Decision-making details documented in ED Course. Radiology: ordered and independent interpretation performed. Decision-making details documented in ED Course. ECG/medicine tests: ordered and independent interpretation performed. Decision-making details documented in ED Course.  Risk Decision regarding hospitalization.   Patient arrives unresponsive, evidence of nausea and vomiting.  Initial call was for headache.  Last normal was 10 PM last night.  On arrival she is not protecting her airway.  Emergently intubated. Concern for intracranial hemorrhage.  Code stroke was activated as last normal was  10 PM last night.  Dr. Michaela at bedside.  Patient initiated on IV Cleviprex  and IV propofol .  Patient intubated on arrival as she was not protecting her airway.  Some decorticate posturing bilaterally  CT scan shows large intraparenchymal hemorrhage on the right side with some subdural and subarachnoid hemorrhage as well.  Dr. Michaela of neurology has discussed with Dr. Lanis of neurosurgery who will evaluate patient.  This is suspected to be aneurysmal though none is seen on CTA.  Patient loaded with IV Keppra .  Continue Cleviprex  for blood pressure control.  Hypertonic saline ordered per neurology.  Dr. Michaela states patient still has active hemorrhage ongoing in her brain.  Would not benefit from surgical intervention currently with  concern for possible rebleeding.  May reevaluate if she is able to remain stable over the next 6 to 8 hours.  Patient discussed with Benton RIGGERS of critical care. Neurosurgical evaluation pending.       Final diagnoses:  Nontraumatic cortical hemorrhage of right cerebral hemisphere [facility])  Acute respiratory failure with hypoxia [facility])    ED Discharge Orders     None          Carita Senior, MD 02/15/24 [phone]

## 2024-02-15 NOTE — Progress Notes (Signed)
 STROKE TEAM PROGRESS NOTE   SUBJECTIVE (INTERVAL HISTORY) Her RN and CCM Dr. Meade are at the bedside.  Overall her condition is stable. She came back from OR after ICH evacuation. Now on sedation, but withdraw to pain on the right and purposeful movement on the RUE.    OBJECTIVE Temp:  [96.3 F (35.7 C)] 96.3 F (35.7 C) (08/02 0740) Pulse Rate:  [41-69] 65 (08/02 1245) Cardiac Rhythm: Normal sinus rhythm (08/02 1149) Resp:  [0-21] 18 (08/02 1245) BP: (112-201)/(60-99) 124/82 (08/02 1245) SpO2:  [100 %] 100 % (08/02 1245) Arterial Line BP: (109-247)/(61-202) 134/68 (08/02 1245) FiO2 (%):  [60 %-100 %] 60 % (08/02 1144) Weight:  [110 kg] 110 kg (08/02 0647)  Recent Labs  Lab 02/15/24 0641 02/15/24 1039  GLUCAP 268* 148*   Recent Labs  Lab 02/15/24 0653 02/15/24 0659 02/15/24 0751 02/15/24 1050 02/15/24 1128  NA 136 135 137 139 139  K 3.1* 3.3* 2.8* 3.4* 3.9  CL 104 101  --  100  --   CO2  --  22  --   --   --   GLUCOSE 216* 216*  --  136*  --   BUN 8 9  --  8  --   CREATININE 0.70 0.85  --  0.70  --   CALCIUM  --  8.7*  --   --   --    Recent Labs  Lab 02/15/24 0659  AST 20  ALT 12  ALKPHOS 67  BILITOT 0.5  PROT 7.4  ALBUMIN 3.6   Recent Labs  Lab 02/15/24 0653 02/15/24 0659 02/15/24 0751 02/15/24 1050 02/15/24 1128  WBC  --  8.0  --   --   --   NEUTROABS  --  6.4  --   --   --   HGB 12.6 11.1* 11.2* 9.2* 9.9*  HCT 37.0 35.5* 33.0* 27.0* 29.0*  MCV  --  94.7  --   --   --   PLT  --  412*  --   --   --    No results for input(s): CKTOTAL, CKMB, CKMBINDEX, TROPONINI in the last 168 hours. No results for input(s): LABPROT, INR in the last 72 hours. No results for input(s): COLORURINE, LABSPEC, PHURINE, GLUCOSEU, HGBUR, BILIRUBINUR, KETONESUR, PROTEINUR, UROBILINOGEN, NITRITE, LEUKOCYTESUR in the last 72 hours.  Invalid input(s): APPERANCEUR     Component Value Date/Time   CHOL 135 05/07/2023 0845   TRIG 54  05/07/2023 0845   HDL 45 05/07/2023 0845   CHOLHDL 3.0 05/07/2023 0845   LDLCALC 78 05/07/2023 0845   Lab Results  Component Value Date   HGBA1C 5.7 (H) 05/07/2023   No results found for: LABOPIA, COCAINSCRNUR, LABBENZ, AMPHETMU, THCU, LABBARB  Recent Labs  Lab 02/15/24 0702  ETH <15    I have personally reviewed the radiological images below and agree with the radiology interpretations.  DG CHEST PORT 1 VIEW Result Date: 02/15/2024 CLINICAL DATA:  48 year old female intubated. Severe intracranial hemorrhage. Abdominal heterotaxy syndrome. EXAM: PORTABLE CHEST 1 VIEW COMPARISON:  CTA neck today.  Chest radiographs 08/06/2023. CT Abdomen and Pelvis 12/14/2023. FINDINGS: Portable AP semi upright view at 0824 hours. Endotracheal tube tip in good position between the clavicles and carina on this image. Enteric tube courses to the abdomen, tip is in the right upper quadrant compatible with satisfactory gastric placement when compared to CT Abdomen and Pelvis 12/14/2023 demonstrating abnormal upper abdominal situs. Mildly lower lung volumes compared to January. Normal cardiac  size and mediastinal contours. No pneumothorax, pulmonary edema. No definite pleural effusion or consolidation. Paucity of bowel gas in the upper abdomen. No acute osseous abnormality identified. IMPRESSION: 1. Satisfactory ET tube and enteric tube placement. 2. Lower lung volumes, no acute cardiopulmonary abnormality. 3. Incidental abnormal upper situs abnormality demonstrated on prior CT Abdomen and Pelvis. Electronically Signed   By: VEAR Hurst M.D.   On: 02/15/2024 08:45   CT HEAD CODE STROKE WO CONTRAST Addendum Date: 02/15/2024 ADDENDUM REPORT: 02/15/2024 07:35 ADDENDUM: Critical Value/emergent results were called by telephone at the time of interpretation on 02/15/2024 at 0717 hours to Dr. Aisha Seals who verbally acknowledged these results. Electronically Signed   By: VEAR Hurst M.D.   On: 02/15/2024 07:35    Result Date: 02/15/2024 CLINICAL DATA:  Code stroke. 48 year old female neurologic deficit. Intubated. EXAM: CT HEAD WITHOUT CONTRAST TECHNIQUE: Contiguous axial images were obtained from the base of the skull through the vertex without intravenous contrast. RADIATION DOSE REDUCTION: This exam was performed according to the departmental dose-optimization program which includes automated exposure control, adjustment of the mA and/or kV according to patient size and/or use of iterative reconstruction technique. COMPARISON:  None Available. FINDINGS: Brain: Large and hyperdense intra-axial hemorrhage throughout the right temporal lobe. Substantial superimposed extension into the subdural space suspected including along the right tentorium, at the floor of the right middle cranial fossa. See coronal images 27, 32. Additionally, there is combined hyperdense and isodense widespread right hemispheric subdural hematoma which is up to 7 mm thickness on the coronal images, especially along the anterior right frontal convexity. This is subtle on axial images. See coronal image 47. Large and lobulated intra-axial hematoma encompasses 73 x 47 by 44 mm (AP by transverse by CC) for an estimated intra-axial volume of 75 mL. Additionally, a comparatively small volume of subarachnoid hemorrhage is demonstrated in the posterior fossa, especially the prepontine cistern. And furthermore, possible small acute Duret hemorrhagein the brainstem on series 2, image 10. Confluent surrounding right temporal lobe edema. Intracranial mass effect with leftward midline shift of 9 mm. Early trapping of the left lateral ventricle, dilated left temporal horn. Uncal herniation. All basilar cisterns are partially effaced. Small left basal ganglia vascular calcification. Vascular: No suspicious intracranial vascular hyperdensity. Skull: Intact. Sinuses/Orbits: Visualized paranasal sinuses and mastoids are stable and well aerated. Other: Mildly  Disconjugate gaze. Visualized scalp soft tissues are within normal limits. ASPECTS [facility] Stroke Program Early CT Score) Total score (0-10 with 10 being normal): Not applicable, acute hemorrhage. IMPRESSION: 1. Severe acute intracranial hemorrhage, probably originated as intra-axial bleed into the right temporal lobe, with large hematoma there estimated at 75 mL. But also substantial extension into the right subdural space (up to 7 mm thickness), and subarachnoid space extension. Possible Duret hemorrhagein the right brainstem also. 2. Intracranial mass effect with widespread cerebral edema. Right uncal herniation. Trapped left lateral ventricle. Effaced basilar cisterns. Electronically Signed: By: VEAR Hurst M.D. On: 02/15/2024 07:10   CT ANGIO HEAD NECK W WO CM (CODE STROKE) Result Date: 02/15/2024 CLINICAL DATA:  48 year old female code stroke presentation with severe intracranial hemorrhage. [place]clap headache reported. EXAM: CT ANGIOGRAPHY HEAD AND NECK TECHNIQUE: Multidetector CT imaging of the head and neck was performed using the standard protocol during bolus administration of intravenous contrast. Multiplanar CT image reconstructions and MIPs were obtained to evaluate the vascular anatomy. Carotid stenosis measurements (when applicable) are obtained utilizing NASCET criteria, using the distal internal carotid diameter as the denominator. RADIATION DOSE REDUCTION: This  exam was performed according to the departmental dose-optimization program which includes automated exposure control, adjustment of the mA and/or kV according to patient size and/or use of iterative reconstruction technique. CONTRAST:  75mL OMNIPAQUE  IOHEXOL  350 MG/ML SOLN COMPARISON:  Noncontrast head CT 0658 hours today. FINDINGS: CTA NECK Skeleton: No acute osseous abnormality identified. Upper chest: Intubated. Endotracheal tube tip may extend to the carina, correlate with dedicated chest x-ray. Ground-glass opacity in the upper lungs.  Other neck: Satisfactory endotracheal tube course in the neck. Left nasal airway also in place. Fluid in the pharynx. Nonvascular neck soft tissue spaces otherwise within normal limits. Aortic arch: 3 vessel arch appears normal. Right carotid system: Negative right CCA and right carotid bifurcation. Right ICA has a tapered appearance in the neck as it approaches the skull base. Left carotid system: Negative left CCA and left carotid bifurcation. Left ICA patent to the skull base, less tapered appearance. Vertebral arteries: Proximal right subclavian artery and cervical right vertebral arteries are normal. Proximal left subclavian artery and cervical left vertebral arteries are normal, the left vertebral is mildly non dominant. CTA HEAD Posterior circulation: Patent but diminutive vertebrobasilar system, no discrete atherosclerosis or stenosis. Bilateral PICA, SCA and PCA origins remain patent. Right posterior communicating artery is patent. Left posterior communicating artery appears diminutive. Bilateral PCA branches are patent without stenosis. Anterior circulation: Right ICA siphon is tapered and diminutive, and functionally terminates at the right posterior communicating artery, either spasmed or occluded at the right ICA terminus. Likewise, severe spasm or occlusion of the right MCA and ACA origins, A1 and M1 segments. There is regional mass effect there related to the right temporal lobe hematoma. Distally right MCA branches are filling and enhancing relatively symmetric to the left side (series 9, image 11) although with substantial mass effect from the right temporal lobe hemorrhage. Also, anterior communicating artery and bilateral ACA branches are within normal limits. Contralateral left ICA siphon is patent to the terminus with a more normal appearance. Left MCA and ACA origins appear normal. The left A1 and M1 appear normal. Left MCA bifurcation and branches appear patent and within normal limits.  Additionally, positive CTA spot sign along the right inferior right temporal lobe hemorrhage site, see series 13, image 109 and series 10, image 116. This is well away from the abnormally attenuated and elevated right MCA branches. Venous sinuses: Early contrast timing, not well evaluated. Anatomic variants: Dominant right vertebral artery. Review of the MIP images confirms the above findings IMPRESSION: 1. Severe intracranial hemorrhage with Positive CTA Spot Sign along the right inferior temporal lobe intra-axial hematoma. Nonvisualization of the Right ICA terminus and right M1 and A1 segments. These could be occluded or severely spasmed. Associated severe mass effect on the Right ICA terminus and Right MCA branches from the intracranial hemorrhage. No discrete intracranial aneurysm identified. 2. Posterior circulation and left anterior circulation appear more normal, however, generally diminutive. And this may also be an indication of severely elevated intracranial pressure. 3. No superimposed atherosclerosis identified. 4. The above discussed by telephone with Dr. AISHA SEALS on 02/15/2024 at 0728 hours. 5. Additionally, endotracheal tube tip may be at the carina, not included on the CT imaging. Recommend portable chest x-ray when feasible. Electronically Signed   By: VEAR Hurst M.D.   On: 02/15/2024 07:35     PHYSICAL EXAM  Temp:  [96.3 F (35.7 C)] 96.3 F (35.7 C) (08/02 0740) Pulse Rate:  [41-69] 65 (08/02 1245) Resp:  [0-21] 18 (08/02 1245) BP: (  112-201)/(60-99) 124/82 (08/02 1245) SpO2:  [100 %] 100 % (08/02 1245) Arterial Line BP: (109-247)/(61-202) 134/68 (08/02 1245) FiO2 (%):  [60 %-100 %] 60 % (08/02 1144) Weight:  [110 kg] 110 kg (08/02 0647)  General - Well nourished, well developed, intubated on sedation.  Ophthalmologic - fundi not visualized due to noncooperation.  Cardiovascular - Regular rhythm with bradycardia.  Neuro - intubated on sedation, eyes closed, not  following commands. With forced eye opening, eyes in mid position, not blinking to visual threat, doll's eyes absent, not tracking, pupils 2mm bilateral, sluggish to light. Corneal reflex weakly present, gag and cough weakly present. Breathing over the vent.  Facial symmetry not able to test due to ET tube.  Tongue protrusion not cooperative. On pain stimulation, RUE against gravity with purposeful movement, RLE mild withdraw to pain. LUE and LLE no movement with painful stimulation. Sensation, coordination and gait not tested.   ASSESSMENT/PLAN [name] is a 48 y.o. female with history of interstitial lung disease on CellCept, hypertension, hyperlipidemia, asthma admitted for headache, nausea vomiting and altered mental status. No TNK given due to [facility].    ICH:  right temporal large ICH with SDH, etiology unclear CT head right temporal large ICH with SDH, possible pseudo SAH, Duret hemorrhage with cerebral edema CT head and neck possible spot sign, right MCA occlusion versus vasospasm versus moyamoya syndrome Status post hematoma evacuation CT head pending in a.m. MRI brain and MRA head later 2D Echo pending LDL pending HgbA1c pending UDS negative SCDs for VTE prophylaxis No antithrombotic prior to admission, now on No antithrombotic due to ICH Ongoing aggressive stroke risk factor management Therapy recommendations: Pending Disposition: Pending  Cerebral edema Brain herniation CT head showed intracranial mass effect with widespread cerebral edema, right uncal herniation, trapped left lateral ventricle, effaced basilar cisterns, possible Duret hemorrhage in the right brainstem Status post hematoma evacuation with Dr. Lanis CT repeat in a.m. Status post 23.4% bolus Now on 3% saline @ 75 Na Q6 with goal 150-155 Na 137--141--140  Respiratory failure Intubated on sedation Vent management per CCM  Hypertension Stable On low-dose Cleviprex  BP goal less than  160 Long-term BP goal normotensive  Hyperlipidemia Home meds: None LDL pending, goal < 70 Consider statin if needed  Other Stroke Risk Factors Obesity, Body mass index is 37.97 kg/m.   Other Active Problems Interstitial lung disease on CellCept Asthma  Hospital day # 0  This patient is critically ill due to large ICH and SDH status post hematoma evacuation, respiratory failure, cerebral edema and at significant risk of neurological worsening, death form brain herniation, brain death. This patient's care requires constant monitoring of vital signs, hemodynamics, respiratory and cardiac monitoring, review of multiple databases, neurological assessment, discussion with family, other specialists and medical decision making of high complexity. I spent 30 minutes of neurocritical care time in the care of this patient.  Ary Cummins, MD PhD Stroke Neurology 02/15/2024 1:03 PM    To contact Stroke Continuity provider, please refer to [email or web address]. After hours, contact General Neurology

## 2024-02-15 NOTE — Anesthesia Procedure Notes (Signed)
 Arterial Line Insertion Start/End8/08/2023 11:15 AM, 02/15/2024 11:26 AM Performed by: Windle Lauraine LABOR, CRNA, CRNA  Patient location: OR. Preanesthetic checklist: patient identified, IV checked, site marked, risks and benefits discussed, surgical consent, monitors and equipment checked, pre-op evaluation and timeout performed Patient sedated Right, radial was placed Catheter size: 20 G Hand hygiene performed , maximum sterile barriers used  and Seldinger technique used Allen's test indicative of satisfactory collateral circulation Attempts: 1 (multiple attempts to L radial by MD) Procedure performed without using ultrasound guided technique. Following insertion, dressing applied and Biopatch. Patient tolerated the procedure well with no immediate complications.

## 2024-02-15 NOTE — H&P (Signed)
 NAME:  Katie Knight, MRN:  [id number], DOB:  July 29, 1975, LOS: 0 ADMISSION DATE:  02/15/2024, CONSULTATION DATE:  02/15/2024 REFERRING MD:  Dr. 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  Stroke code stroke CTA showed large intraparenchymal hemorrhage on the right side with some subdural subarachnoid hemorrhage as well.  Neurosurgery and neurology consulted for further management.  PCCM consulted for admission.  On ED arrival patient was seen mildly hypothermic, bradycardic, and hypertensive.  Lab work significant for potassium 3.3, glucose 216, hemoglobin 11 point.   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 rate 19, height 5' 7 (1.702 m), weight 110 kg, SpO2 100%.    Vent Mode: PRVC FiO2 (%):  [100 %] 100 % Set Rate:  [18 bmp] 18 bmp Vt Set:  [500 mL] 500 mL PEEP:  [5 cmH20] 5 cmH20  No intake or output data in the 24 hours ending 02/15/24 0743 Filed Weights   02/15/24 0647  Weight: 110 kg    Examination: General: Acute ill-appearing adult female lying in bed on  mechanical ventilation in no acute distress HEENT: ETT, MM pink/moist, PERRL,  Neuro: Sedated on ventilator, some movement to left side per ED nurse CV: s1s2 regular rate and rhythm, no murmur, rubs, or gallops,  PULM: Clear to auscultation bilaterally, no increased work of breathing, no added breath sounds GI: soft, bowel sounds active in all 4 quadrants, non-tender, non-distended Extremities: warm/dry, no edema  Skin: no rashes or lesions]  Resolved problem list   Assessment and Plan  Acute intraparenchymal hemorrhage -Stroke code stroke CTA showed large intraparenchymal hemorrhage on the right side with some subdural subarachnoid hemorrhage as well.   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 Head of bed elevated 30 degrees. Plateau pressures less than 30 cm H20.  Follow intermittent chest x-ray and ABG.   SAT/SBT as tolerated, mentation preclude extubation  Ensure adequate pulmonary hygiene  Follow cultures  VAP bundle in place  PAD protocol Empiric ceftriaxone  Obtain chest x-ray Hold home CellCept  Hyperglycemia - Unknown history of diabetes with last hemoglobin A1c October 2024 was 5.7 P: Start moderate scale SSI CBC checks every 4 CBG goal 140-180  Hypertension Hyperlipidemia - Medication reconciliation not  yet complete.  Appears patient is on no medications at baseline for hypertension P:  Strict BP control  per neurology as above   Best Practice (right click and Reselect all SmartList Selections daily)   Diet/type: NPO DVT prophylaxis SCD Pressure ulcer(s): N/A GI prophylaxis: PPI Lines: N/A Foley:  Yes, and it is still needed Code Status:  full code Last date of multidisciplinary goals of care discussion: Family updated per neurology continue current aggressive interventions  Labs   CBC: Recent Labs  Lab 02/15/24 0653 02/15/24 0659  WBC  --  8.0  NEUTROABS  --  6.4  HGB 12.6 11.1*  HCT 37.0 35.5*  MCV  --  94.7  PLT  --  412*    Basic Metabolic Panel: Recent Labs  Lab 02/15/24 0653  NA 136  K 3.1*  CL 104  GLUCOSE 216*  BUN 8  CREATININE 0.70   GFR: Estimated Creatinine Clearance: 112.4 mL/min (by C-G formula based on SCr of 0.7 mg/dL). Recent Labs  Lab 02/15/24 0659  WBC 8.0    Liver Function Tests: No results for input(s): AST, ALT, ALKPHOS, BILITOT, PROT, ALBUMIN in the last 168 hours. No results for input(s): LIPASE, AMYLASE in the last 168 hours. No results for input(s): AMMONIA in the last 168 hours.  ABG    Component Value Date/Time   TCO2 21 (L) 02/15/2024 0653     Coagulation Profile: No results for input(s): INR, PROTIME in the last 168 hours.  Cardiac Enzymes: No results for input(s): CKTOTAL, CKMB, CKMBINDEX, TROPONINI in the last 168 hours.  HbA1C: Hgb A1c MFr Bld  Date/Time Value Ref Range Status  05/07/2023 08:45 AM 5.7 (H) 4.8 - 5.6 % Final    Comment:             Prediabetes: 5.7 - 6.4          Diabetes: >6.4          Glycemic control for adults with diabetes: <7.0     CBG: Recent Labs  Lab 02/15/24 0641  GLUCAP 268*    Review of Systems:   Unable to assess Past Medical History:  She,  has a past medical history of Arthritis, Asthma, High blood pressure, Hyperlipidemia, Joint pain, Osteoporosis, SOB (shortness of breath), and Vitamin D  deficiency.   Surgical History:   Past Surgical  History:  Procedure Laterality Date   BRONCHIAL BIOPSY  04/30/2022   Procedure: BRONCHIAL BIOPSIES;  Surgeon: Annella Donnice SAUNDERS, MD;  Location: [facility];  Service: Endoscopy;;   BRONCHIAL NEEDLE ASPIRATION BIOPSY  04/30/2022   Procedure: BRONCHIAL NEEDLE ASPIRATION BIOPSIES;  Surgeon: Annella Donnice SAUNDERS, MD;  Location: [facility];  Service: Endoscopy;;   BRONCHIAL WASHINGS  04/30/2022   Procedure: BRONCHIAL WASHINGS;  Surgeon: Annella Donnice SAUNDERS, MD;  Location: [facility];  Service: Endoscopy;;   DILATION AND CURETTAGE OF UTERUS     ENDOBRONCHIAL ULTRASOUND N/A 04/30/2022   Procedure: ENDOBRONCHIAL ULTRASOUND;  Surgeon: Annella Donnice SAUNDERS, MD;  Location: [facility];  Service: Endoscopy;  Laterality: N/A;   INTERCOSTAL NERVE BLOCK Right 07/26/2023   Procedure: INTERCOSTAL NERVE BLOCK;  Surgeon: Kerrin Elspeth BROCKS, MD;  [facility];  Service: Thoracic;  Laterality: Right;   LUNG BIOPSY Right 07/26/2023   Procedure: LUNG BIOPSY;  Surgeon: Kerrin Elspeth BROCKS, MD;  Location: [facility] OR;  Service: Thoracic;  Laterality: Right;   VIDEO BRONCHOSCOPY  04/30/2022   Procedure: VIDEO BRONCHOSCOPY WITHOUT FLUORO;  Surgeon: Annella Donnice SAUNDERS, MD;  Location: [facility];  Service: Endoscopy;;     Social History:   reports that she has never smoked. She has been exposed to tobacco smoke. She has never used smokeless tobacco. She reports current alcohol  use. She reports that she does not use drugs.   Family History:  Her family history includes Hypertension in her father and mother. There is no history of Colon cancer, Esophageal cancer, Rectal cancer, or Stomach cancer.   Allergies No Known Allergies   Home Medications  Prior to Admission medications   Medication Sig Start Date End Date Taking? Authorizing Provider  acetaminophen  (TYLENOL ) 650 MG CR tablet Take 1,300 mg by mouth 2 (two) times daily as needed for pain.    [provider]  budesonide -formoterol   (SYMBICORT ) 160-4.5 MCG/ACT inhaler Inhale 2 puffs into the lungs in the morning and at bedtime. Patient taking differently: Inhale 2 puffs into the lungs 2 (two) times daily as needed (shortness of breath). 01/01/23   Hunsucker, Donnice SAUNDERS, MD  gabapentin  (NEURONTIN ) 300 MG capsule Take 1 tablet by mouth at bedtime for 3 days. Then increase 1 tablet twice a day. Patient taking differently: Take 300 mg by mouth 2 (two) times daily as needed (pain, neuropathy). 08/06/23   Kerrin Elspeth BROCKS, MD  LINZESS  145 MCG CAPS capsule Take 145 mcg by mouth daily before breakfast. 12/07/21   [provider]  mycophenolate (CELLCEPT) 500 MG tablet Take 1,500 mg by mouth 2 (two) times daily.    [provider]  ondansetron  (ZOFRAN ) 4 MG tablet Take 1 tablet (4 mg total) by mouth every 6 (six) hours. 11/22/23   Laurice Maude BROCKS, MD  ondansetron  (ZOFRAN -ODT) 4 MG disintegrating tablet Take 1 tablet (4 mg total) by mouth every 8 (eight) hours as needed for nausea or vomiting. 12/14/23   Freddi Hamilton, MD  oxyCODONE -acetaminophen  (PERCOCET/ROXICET) 5-325 MG tablet Take 1 tablet by mouth every 6 (six) hours as needed for severe pain (pain score 7-10). 12/14/23   Freddi Hamilton, MD  PROAIR  HFA 108 (90 Base) MCG/ACT inhaler Inhale 2 puffs into the lungs every 4 (four) hours as needed for wheezing or shortness of breath. 12/22/21   O'NealDarryle Ned, MD  tamsulosin  (FLOMAX ) 0.4 MG CAPS capsule Take 1 capsule (0.4 mg total) by mouth daily. 12/15/23   Freddi Hamilton, MD     Critical care time:   CRITICAL CARE Performed by: Bathsheba Durrett D. Harris   Total critical care time: 45 minutes  Critical care time was exclusive of separately billable procedures and treating other patients.  Critical care was necessary to treat or prevent imminent or life-threatening deterioration.  Critical care was time spent personally by me on the following activities: development of treatment plan with patient and/or surrogate  as well as nursing, discussions with consultants, evaluation of patient's response to treatment, examination of patient, obtaining history from patient or surrogate, ordering and performing treatments and interventions, ordering and review of laboratory studies, ordering and review of radiographic studies, pulse oximetry and re-evaluation of patient's condition.  Arvil Utz D. Harris, NP-C [facility] Personal contact information can be found on Amion  If no contact or response made please call [phone] 02/15/2024, 8:16 AM

## 2024-02-15 NOTE — ED Notes (Signed)
 Patient transported to OR.

## 2024-02-15 NOTE — Code Documentation (Signed)
 Stroke Response Nurse Documentation Code Documentation  Katie Knight is a 48 y.o. female arriving to [facility]  via [facility] on 8/2 with past medical hx of ILD, asthma. On No antithrombotic. Code stroke was activated by ED.   Patient from home where she was LKW at 2200 and now complaining of headache and right facial droop.   Stroke team at the bedside on patient arrival. Labs drawn and patient cleared for CT by Dr. Carita. Patient to CT with team. NIHSS 28, see documentation for details and code stroke times. Patient with decreased LOC, disoriented, not following commands, bilateral hemianopia, right facial droop, bilateral arm weakness, bilateral leg weakness, right decreased sensation, Global aphasia , and dysarthria  on exam. The following imaging was completed:  CT Head and CTA. Patient is not a candidate for IV Thrombolytic due to ICH. Patient is not a candidate for IR due to ICH.    Bedside handoff with ED RN Milo.    Griselda Alm ORN  Rapid Response RN

## 2024-02-15 NOTE — ED Triage Notes (Signed)
 Pt bib ems. Original call was d/t headache, right sided facial droop and vomiting. Last known well @ 10pm last night. Pt unresponsive during triage

## 2024-02-15 NOTE — ED Notes (Signed)
 Neuro surgery at bedside.

## 2024-02-15 NOTE — Plan of Care (Signed)
  Problem: Coping: Goal: Ability to adjust to condition or change in health will improve Outcome: Not Progressing   Problem: Health Behavior/Discharge Planning: Goal: Ability to manage health-related needs will improve Outcome: Not Progressing   Problem: Clinical Measurements: Goal: Diagnostic test results will improve Outcome: Not Progressing   Problem: Clinical Measurements: Goal: Ability to maintain clinical measurements within normal limits will improve Outcome: Not Progressing   Problem: Pain Managment: Goal: General experience of comfort will improve and/or be controlled Outcome: Not Progressing

## 2024-02-15 NOTE — Op Note (Signed)
 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 Gerldine Maizes, MD  ASSISTANT: Camie Pickle, PA-C  ANESTHESIA: General Endotracheal  EBL: 200cc  SPECIMENS: None  DRAINS: None  COMPLICATIONS: None immediate  CONDITION: Hemodynamically stable to ICU  HISTORY: Katie Knight is a 48 y.o. female presenting to the hospital after being found at home unresponsive.  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  Her CT scan demonstrated a large right temporal hematoma with significant mass effect and effacement of cisterns.  CT angiogram also performed which was negative for arteriovenous malformation or clear aneurysm, although a small spot sign was identified in the substance of the hematoma.  With her clinical exam and radiographic findings consistent with herniation syndrome, operative decompression was indicated.  The risks, benefits, and alternatives to surgery were all reviewed in detail with the patient's wife.  After all questions were answered informed consent was obtained and witnessed.  PROCEDURE IN DETAIL: The patient was brought to the operating room. After induction of general anesthesia, the patient was positioned on the operative table in the [place] head holder in the supine position with a right sided shoulder roll to expose the right temporal region. All pressure points were meticulously padded.  A curvilinear reverse?  Incision was marked out in order to allow access to the posterior aspect of the temporal lobe.  Skin incision was then prepped and draped in the usual sterile fashion.  After timeout was conducted,  the skin incision was infiltrated with local anesthetic with epinephrine .  The anesthesia service was instructed to administer a 50 g bolus dose of mannitol .  Incision was then made sharply and carried down through the galea.  Hemostasis was secured with Raney clips.  Bovie was then used to incise the temporalis muscle and fascia.  A single piece myocutaneous flap was then elevated and reflected anteriorly.  Multiple bur holes were created and connected with the craniotome to fashion a temporal craniotomy.  The bone flap was elevated and placed on the back table.  Hemostasis on the epidural plane was easily secured with bipolar electrocautery.  The dura was then opened in curvilinear fashion based inferiorly.  There was a thin acute subdural hematoma which was easily evacuated.  Bipolar electrocautery was then used to coagulate the pia overlying the middle temporal gyrus posteriorly.  The white matter was dissected with a sucker, and I identified the underlying temporal hematoma.  This was removed using a combination of suction and blunt dissection.  I was then able to identify normal white matter in the posterior, superior, and deep margins.  In removing hematoma from the posteroinferior margin of the cavity, I identified active arterial bleeding.  The microscope was draped sterilely and brought into the field.  The white matter was further dissected.  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  The aneurysm was clipped with a standard curved titanium clip.  Once the clip was applied, all arterial bleeding was controlled.  The hematoma cavity was then irrigated with normal saline.  No further bleeding was identified.  Remainder of hemostasis was easily secured with bipolar electrocautery.  The dura was then reapproximated with interrupted 4  Nurolon stitches.  A piece of Gelfoam was then placed on the bone flap was replaced and plated with  standard titanium plates and screws.  Wound was again irrigated.  Temporalis muscle and fascia were reapproximated with interrupted 0 Vicryl stitches and the galea was reapproximated with interrupted 3-0 Vicryl stitches.  Skin was closed with standard surgical skin staples.  Patient was then removed from the [place] head holder and bacitracin  ointment and a sterile dressing was applied.  At the end of the case all sponge, needle, instrument, and cottonoid counts were correct.  The patient was then transferred to the intensive care unit in stable hemodynamic condition.   Gerldine Maizes, MD [facility]

## 2024-02-15 NOTE — Consult Note (Signed)
 NEUROLOGY CONSULT NOTE   Date of service: February 15, 2024 Patient Name: Katie Knight MRN:  [id number] DOB:  01-16-1976 Chief Complaint: ICH Requesting Provider: Carita Senior, MD  History of Present Illness  Blaine Guiffre is a 48 y.o. female with hx of interstitial lung disease on CellCept who presents with intracranial hemorrhage.  She was driving yesterday, and they arrived home in the evening.  Around 2:30 AM, she and her wife were still awake when the patient had sudden onset of severe headache.  She decided to lay down but then around 4:30 AM she began vomiting profusely.  When her mental status worsened, her significant other decided to call 911 and she was brought into the ER.  On EMS arrival, she would still talk to them and answer some simple questions, but her mental status continued to deteriorate in transit.  On arrival to the emergency department, she was intubated for airway protection and a code stroke was activated.  LKW: 2:30 AM Modified rankin score: 0-Completely asymptomatic and back to baseline post- stroke IV Thrombolysis: No, ICH EVT: No, ICH ICH Score:4   NIHSS components Score: Comment  1a Level of Conscious 0[]  1[]  2[]  3[x]      1b LOC Questions 0[]  1[]  2[x]       1c LOC Commands 0[]  1[]  2[]       2 Best Gaze 0[x]  1[]  2[]       3 Visual 0[]  1[]  2[]  3[x]      4 Facial Palsy 0[x]  1[]  2[]  3[]      5a Motor Arm - left 0[]  1[]  2[]  3[]  4[x]  UN[]    5b Motor Arm - Right 0[]  1[]  2[]  3[]  4[x]  UN[]    6a Motor Leg - Left 0[]  1[]  2[]  3[]  4[x]  UN[]    6b Motor Leg - Right 0[]  1[]  2[]  3[]  4[x]  UN[]    7 Limb Ataxia 0[x]  1[]  2[]  UN[]      8 Sensory 0[x]  1[]  2[]  UN[]      9 Best Language 0[]  1[]  2[]  3[x]      10 Dysarthria 0[]  1[]  2[]  UN[x]      11 Extinct. and Inattention 0[x]  1[]  2[]       TOTAL: 27      Past History   Past Medical History:  Diagnosis Date   Arthritis    Asthma    High blood pressure    Hyperlipidemia    Joint pain    Osteoporosis    SOB  (shortness of breath)    Vitamin D  deficiency     Past Surgical History:  Procedure Laterality Date   BRONCHIAL BIOPSY  04/30/2022   Procedure: BRONCHIAL BIOPSIES;  Surgeon: Annella Donnice SAUNDERS, MD;  Location: [facility];  Service: Endoscopy;;   BRONCHIAL NEEDLE ASPIRATION BIOPSY  04/30/2022   Procedure: BRONCHIAL NEEDLE ASPIRATION BIOPSIES;  Surgeon: Annella Donnice SAUNDERS, MD;  Location: [facility];  Service: Endoscopy;;   BRONCHIAL WASHINGS  04/30/2022   Procedure: BRONCHIAL WASHINGS;  Surgeon: Annella Donnice SAUNDERS, MD;  Location: [facility];  Service: Endoscopy;;   DILATION AND CURETTAGE OF UTERUS     ENDOBRONCHIAL ULTRASOUND N/A 04/30/2022   Procedure: ENDOBRONCHIAL ULTRASOUND;  Surgeon: Annella Donnice SAUNDERS, MD;  Location: [facility];  Service: Endoscopy;  Laterality: N/A;   INTERCOSTAL NERVE BLOCK Right 07/26/2023   Procedure: INTERCOSTAL NERVE BLOCK;  Surgeon: Kerrin Elspeth BROCKS, MD;  Location: [facility] OR;  Service: Thoracic;  Laterality: Right;   LUNG BIOPSY Right 07/26/2023   Procedure: LUNG BIOPSY;  Surgeon: Kerrin Elspeth BROCKS, MD;  Location: [facility] OR;  Service:  Thoracic;  Laterality: Right;   VIDEO BRONCHOSCOPY  04/30/2022   Procedure: VIDEO BRONCHOSCOPY WITHOUT FLUORO;  Surgeon: Annella Donnice SAUNDERS, MD;  Location: [facility];  Service: Endoscopy;;    Family History: Family History  Problem Relation Age of Onset   Hypertension Mother    Hypertension Father    Colon cancer Neg Hx    Esophageal cancer Neg Hx    Rectal cancer Neg Hx    Stomach cancer Neg Hx     Social History  reports that she has never smoked. She has been exposed to tobacco smoke. She has never used smokeless tobacco. She reports current alcohol  use. She reports that she does not use drugs.  No Known Allergies  Medications   Current Facility-Administered Medications:    clevidipine  (CLEVIPREX ) 0.5 MG/ML infusion, , , ,    clevidipine  (CLEVIPREX ) infusion 0.5 mg/mL, 0-21 mg/hr, Intravenous,  Continuous, Sponseller, Rebekah R, PA-C, Paused at 02/15/24 0747   fentaNYL  in NS (97mcg/ml) infusion-PREMIX, 0-400 mcg/hr, Intravenous, Continuous, Sponseller, Rebekah R, PA-C, Last Rate: 2.5 mL/hr at 02/15/24 0723, 25 mcg/hr at 02/15/24 0723   propofol  (DIPRIVAN ) 1000 MG/100ML infusion, 0-80 mcg/kg/min, Intravenous, Continuous, Sponseller, Rebekah R, PA-C, Last Rate: 13.2 mL/hr at 02/15/24 0649, 20 mcg/kg/min at 02/15/24 9350   propofol  (DIPRIVAN ) 1000 MG/100ML infusion, , , ,   Current Outpatient Medications:    acetaminophen  (TYLENOL ) 650 MG CR tablet, Take 1,300 mg by mouth 2 (two) times daily as needed for pain., Disp: , Rfl:    budesonide -formoterol  (SYMBICORT ) 160-4.5 MCG/ACT inhaler, Inhale 2 puffs into the lungs in the morning and at bedtime. (Patient taking differently: Inhale 2 puffs into the lungs 2 (two) times daily as needed (shortness of breath).), Disp: 1 each, Rfl: 5   gabapentin  (NEURONTIN ) 300 MG capsule, Take 1 tablet by mouth at bedtime for 3 days. Then increase 1 tablet twice a day. (Patient taking differently: Take 300 mg by mouth 2 (two) times daily as needed (pain, neuropathy).), Disp: 60 capsule, Rfl: 2   LINZESS  145 MCG CAPS capsule, Take 145 mcg by mouth daily before breakfast., Disp: , Rfl:    mycophenolate (CELLCEPT) 500 MG tablet, Take 1,500 mg by mouth 2 (two) times daily., Disp: , Rfl:    ondansetron  (ZOFRAN ) 4 MG tablet, Take 1 tablet (4 mg total) by mouth every 6 (six) hours., Disp: 12 tablet, Rfl: 0   ondansetron  (ZOFRAN -ODT) 4 MG disintegrating tablet, Take 1 tablet (4 mg total) by mouth every 8 (eight) hours as needed for nausea or vomiting., Disp: 10 tablet, Rfl: 0   oxyCODONE -acetaminophen  (PERCOCET/ROXICET) 5-325 MG tablet, Take 1 tablet by mouth every 6 (six) hours as needed for severe pain (pain score 7-10)., Disp: 15 tablet, Rfl: 0   PROAIR  HFA 108 (90 Base) MCG/ACT inhaler, Inhale 2 puffs into the lungs every 4 (four) hours as needed for  wheezing or shortness of breath., Disp: 6.7 each, Rfl: 6   tamsulosin  (FLOMAX ) 0.4 MG CAPS capsule, Take 1 capsule (0.4 mg total) by mouth daily., Disp: 7 capsule, Rfl: 0  Vitals   Vitals:   02/15/24 0738 02/15/24 0740 02/15/24 0742 02/15/24 0756  BP: 129/62 128/60 126/71   Pulse:      Resp: 20 18 18    Temp:  (!) 96.3 F (35.7 C)    TempSrc:  Temporal    SpO2:    100%  Weight:      Height:        Body mass index is 37.98 kg/m.  Physical Exam   Constitutional: Appears well-developed and well-nourished.   Neurologic Examination    Neuro: Mental Status: Patient is comatose, she does not open eyes or follow commands, GCS 4 Cranial Nerves: II: Does not blink to threat right pupil is 3 mm, left pupil is 2 mm, both are reactive III,IV, VI: No response to doll's maneuver V: VII: Corneals are intact X: Cough is intact Motor: She extensor postures to noxious stimulation in all four extremities  sensory: As above  Cerebellar: Does not perform       Labs/Imaging/Neurodiagnostic studies   CBC:  Recent Labs  Lab 23-Feb-2024 0659 2024-02-23 0751  WBC 8.0  --   NEUTROABS 6.4  --   HGB 11.1* 11.2*  HCT 35.5* 33.0*  MCV 94.7  --   PLT 412*  --    Basic Metabolic Panel:  Lab Results  Component Value Date   NA 137 2024-02-23   K 2.8 (L) 02-23-2024   CO2 22 02-23-24   GLUCOSE 216 (H) 02/23/2024   BUN 9 February 23, 2024   CREATININE 0.85 02/23/2024   CALCIUM 8.7 (L) 02/23/24   GFRNONAA >60 02-23-24   GFRAA >60 02/06/2018   Lipid Panel:  Lab Results  Component Value Date   LDLCALC 78 05/07/2023   HgbA1c:  Lab Results  Component Value Date   HGBA1C 5.7 (H) 05/07/2023   Urine Drug Screen: No results found for: LABOPIA, COCAINSCRNUR, LABBENZ, AMPHETMU, THCU, LABBARB  Alcohol  Level     Component Value Date/Time   [facility] <15 02-23-24 0702   INR  Lab Results  Component Value Date   INR 1.1 07/24/2023   APTT  Lab Results  Component Value Date    APTT 38 (H) 07/24/2023    CT Head without contrast(Personally reviewed): Very large right temporal hematoma with subdural and subarachnoid hemorrhage, though possibly pseudo subarachnoid  CT angio Head and Neck with contrast(Personally reviewed): Unusual appearance of the right ICA terminus, almost has a moyamoya type appearance   ASSESSMENT   Johnelle Tafolla is a 48 y.o. female with acute ICH of unclear origin.  She does have a history of hypertension but is not prescribed medications for it.  In any case, it appears to be a cortically based hemorrhage that extends very close to the skull and is immediately life-threatening.  Surgery in this scenario can sometimes be lifesaving, though would not be expected to reverse the effects of the hemorrhage.  Given her young age, I have discussed her case with Dr. Lanis who will come and see her in consultation with consideration of evacuation.  Given her extremis, I will give her a 23% bolus  RECOMMENDATIONS  Avoid antiplatelets and anticoagulants Bolus 23% 30 mL Appreciate neurosurgical consultation Continue Cleviprex  with goal BP 130-150 Appreciate CCM management Stroke team will follow ______________________________________________________________________   This patient is critically ill and at significant risk of neurological worsening, death and care requires constant monitoring of vital signs, hemodynamics,respiratory and cardiac monitoring, neurological assessment, discussion with family, other specialists and medical decision making of high complexity. I spent 70 minutes of neurocritical care time  in the care of  this patient. This was time spent independent of any time provided by nurse practitioner or PA.  Aisha Seals, MD [facility]   If 7pm- 7am, please page neurology on call as listed in AMION. 2024-02-23  8:36 AM

## 2024-02-16 ENCOUNTER — Inpatient Hospital Stay (HOSPITAL_COMMUNITY)

## 2024-02-16 DIAGNOSIS — J9601 Acute respiratory failure with hypoxia: Secondary | ICD-10-CM | POA: Diagnosis not present

## 2024-02-16 DIAGNOSIS — E785 Hyperlipidemia, unspecified: Secondary | ICD-10-CM

## 2024-02-16 DIAGNOSIS — I6200 Nontraumatic subdural hemorrhage, unspecified: Secondary | ICD-10-CM | POA: Diagnosis not present

## 2024-02-16 DIAGNOSIS — I10 Essential (primary) hypertension: Secondary | ICD-10-CM

## 2024-02-16 DIAGNOSIS — G935 Compression of brain: Secondary | ICD-10-CM | POA: Diagnosis not present

## 2024-02-16 DIAGNOSIS — I619 Nontraumatic intracerebral hemorrhage, unspecified: Secondary | ICD-10-CM | POA: Diagnosis not present

## 2024-02-16 DIAGNOSIS — G936 Cerebral edema: Secondary | ICD-10-CM | POA: Diagnosis not present

## 2024-02-16 DIAGNOSIS — J9602 Acute respiratory failure with hypercapnia: Secondary | ICD-10-CM | POA: Diagnosis not present

## 2024-02-16 DIAGNOSIS — I611 Nontraumatic intracerebral hemorrhage in hemisphere, cortical: Secondary | ICD-10-CM | POA: Diagnosis not present

## 2024-02-16 LAB — MAGNESIUM: Magnesium: 2 mg/dL (ref 1.7–2.4)

## 2024-02-16 LAB — BASIC METABOLIC PANEL WITH GFR
Anion gap: 8 (ref 5–15)
BUN: 8 mg/dL (ref 6–20)
CO2: 22 mmol/L (ref 22–32)
Calcium: 8.1 mg/dL — ABNORMAL LOW (ref 8.9–10.3)
Chloride: 118 mmol/L — ABNORMAL HIGH (ref 98–111)
Creatinine, Ser: 0.78 mg/dL (ref 0.44–1.00)
GFR, Estimated: >60 mL/min (ref >60)
Glucose, Bld: 133 mg/dL — ABNORMAL HIGH (ref 70–99)
Potassium: 3.7 mmol/L (ref 3.5–5.1)
Sodium: 148 mmol/L — ABNORMAL HIGH (ref 135–145)

## 2024-02-16 LAB — CBC
HCT: 31.1 % — ABNORMAL LOW (ref 36.0–46.0)
Hemoglobin: 9.7 g/dL — ABNORMAL LOW (ref 12.0–15.0)
MCH: 29.3 pg (ref 26.0–34.0)
MCHC: 31.2 g/dL (ref 30.0–36.0)
MCV: 94 fL (ref 80.0–100.0)
Platelets: 359 K/uL (ref 150–400)
RBC: 3.31 MIL/uL — ABNORMAL LOW (ref 3.87–5.11)
RDW: 13.4 % (ref 11.5–15.5)
WBC: 19.4 K/uL — ABNORMAL HIGH (ref 4.0–10.5)
nRBC: 0 % (ref 0.0–0.2)

## 2024-02-16 LAB — GLUCOSE, CAPILLARY
Glucose-Capillary: 134 mg/dL — ABNORMAL HIGH (ref 70–99)
Glucose-Capillary: 137 mg/dL — ABNORMAL HIGH (ref 70–99)
Glucose-Capillary: 73 mg/dL (ref 70–99)
Glucose-Capillary: 81 mg/dL (ref 70–99)
Glucose-Capillary: 91 mg/dL (ref 70–99)
Glucose-Capillary: 96 mg/dL (ref 70–99)

## 2024-02-16 LAB — LIPID PANEL
Cholesterol: 132 mg/dL (ref 0–200)
HDL: 32 mg/dL — ABNORMAL LOW (ref >40)
LDL Cholesterol: 74 mg/dL (ref 0–99)
Total CHOL/HDL Ratio: 4.1 ratio
Triglycerides: 131 mg/dL (ref <150)
VLDL: 26 mg/dL (ref 0–40)

## 2024-02-16 LAB — SODIUM
Sodium: 149 mmol/L — ABNORMAL HIGH (ref 135–145)
Sodium: 155 mmol/L — ABNORMAL HIGH (ref 135–145)

## 2024-02-16 LAB — PHOSPHORUS: Phosphorus: 3 mg/dL (ref 2.5–4.6)

## 2024-02-16 MED ORDER — LOSARTAN POTASSIUM 50 MG PO TABS
100.0000 mg | ORAL_TABLET | Freq: Every day | ORAL | Status: DC
  Administered 2024-02-16 – 2024-02-23 (×8): 100 mg
  Filled 2024-02-16 (×8): qty 2

## 2024-02-16 MED ORDER — AMLODIPINE BESYLATE 10 MG PO TABS
10.0000 mg | ORAL_TABLET | Freq: Every day | ORAL | Status: DC
  Administered 2024-02-16 – 2024-03-04 (×21): 10 mg
  Filled 2024-02-16 (×18): qty 1

## 2024-02-16 NOTE — Progress Notes (Signed)
 eLink Physician-Brief Progress Note Patient Name: Katie Knight DOB: January 11, 1976 MRN: [id number]   Date of Service  02/16/2024  HPI/Events of Note  Camera evaluation done and ordered  right wrist/arm soft restraints request from bed side RN , to prevent self injury and harm while critically ill.   On vent  eICU Interventions       Intervention Category Minor Interventions: Agitation / anxiety - evaluation and management  Katie Knight 02/16/2024, 9:19 PM

## 2024-02-16 NOTE — Progress Notes (Signed)
  NEUROSURGERY PROGRESS NOTE   No issues overnight.   EXAM:  BP (!) 154/64   Pulse 89   Temp 98.7 F (37.1 C) (Axillary)   Resp 14   Ht 5' 7.01 (1.702 m)   Wt 110 kg   SpO2 100%   BMI 37.97 kg/m   Intubated, on low-dose propofol : No eye opening Pupils 2-53mm, sluggish Localizing RUE/RLE Minimal movement LUE Wound c/d/i  IMAGING: CTH this am reviewed, improved mass effect and decreased size of hematoma. No HCP  IMPRESSION:  48 y.o. female POD#1 s/p right temporal crani for evacuation of hematoma, clipping of distal MCA aneurysm. 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

## 2024-02-16 NOTE — Progress Notes (Signed)
 NAME:  Katie Knight, MRN:  [id number], DOB:  03-Jan-1976, LOS: 1 ADMISSION DATE:  02/15/2024, CONSULTATION DATE:  02/15/2024 REFERRING MD:  Dr. 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  Stroke code stroke CTA showed large intraparenchymal hemorrhage on the right side with some subdural subarachnoid hemorrhage as well.  Neurosurgery and neurology consulted for further management.  PCCM consulted for admission.  On ED arrival patient was seen mildly hypothermic, bradycardic, and hypertensive.  Lab work significant for potassium 3.3, glucose 216, hemoglobin 11 point.   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 rate 14, height 5' 7.01 (1.702 m), weight 110 kg, SpO2 100%.    Vent Mode: PRVC FiO2 (%):  [40 %-60 %] 40 % Set Rate:  [18 bmp-20 bmp] 20 bmp Vt Set:  [500 mL] 500 mL PEEP:  [8 cmH20] 8 cmH20 Plateau Pressure:  [11 cmH20-21 cmH20] 21 cmH20   Intake/Output Summary (Last 24 hours) at 02/16/2024 0913 Last data  filed at 02/16/2024 0601 Gross per 24 hour  Intake 2576.15 ml  Output 3225 ml  Net -648.85 ml   Filed Weights   02/15/24 0647  Weight: 110 kg    Examination: Gen:      Intubated, sedated, acutely ill appearing HEENT:  ETT to vent Lungs:    sounds of mechanical ventilation auscultated no wheeze CV:         RRR Abd:      + bowel sounds; soft, non-tender; no palpable masses, no distension Ext:    No edema Skin:      Warm and dry; no rashes Neuro:   on fentanyl  only for my exam. Moves purposefully the RUE. No response from LUE. Reflexive withdrawal in bilateral lower extremities +cough and gag and corneals. Pupils miotic   CT head this morning shows expected post-operative changes.   Resolved problem list   Assessment and Plan  Acute intraparenchymal hemorrhage S/p craniotomy and hematoma evacuation 8/2 with RMCA aneurysm clipping P: Management per neurology/neurosurgery Maintain neuro protective measures; goal for eurothermia, euglycemia, eunatermia, normoxia, and PCO2 goal of 35-40 Nutrition and bowel regiment  Seizure precautions  AEDs per neurology  Aspirations precautions  SBP goal per neurology, on cleviprex  for now.  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 Head of bed elevated 30 degrees. Plateau pressures less than 30 cm H20.  Follow intermittent chest x-ray and ABG.   SAT/SBT as  tolerated, mentation preclude extubation  Ensure adequate pulmonary hygiene  Follow cultures  VAP bundle in place  PAD protocol Empiric ceftriaxone  started, pct low so will stop this today.  Hold home CellCept  Hyperglycemia - Unknown history of diabetes with last hemoglobin A1c October 2024 was 5.7 P: moderate scale  SSI CBC checks every 4 CBG goal 140-180  Hypertension Hyperlipidemia - Medication reconciliation not yet complete.  Appears patient is on no medications at baseline for hypertension P:  Strict BP control per neurology as above   Best Practice (right click and Reselect all SmartList Selections daily)   Diet/type: NPO. Plan for cotrak and initiation of tube feeds 8/4.  DVT prophylaxis SCD Pressure ulcer(s): N/A GI prophylaxis: PPI Lines: N/A Foley:  Yes, and it is still needed Code Status:  full code Last date of multidisciplinary goals of care discussion: wife and mother updated at bedside. Discussed overview of course of prolonged critical illness including feeding tube, probable tracheostomy, LTACH in best case scenario.   The patient is critically ill due to hemorrhage, respiratory failure.  Critical care was necessary to treat or prevent imminent or life-threatening deterioration.  Critical care was time spent personally by me on the following activities: development of treatment plan with patient and/or surrogate as well as nursing, discussions with consultants, evaluation of patient's response to treatment, examination of patient, obtaining history from patient or surrogate, ordering and performing treatments and interventions, ordering and review of laboratory studies, ordering and review of radiographic studies, pulse oximetry, re-evaluation of patient's condition and participation in multidisciplinary rounds.   Critical Care Time devoted to patient care services described in this note is 45 minutes. This time reflects time of care of this signee [name] . This critical care time does not reflect separately billable procedures or procedure time, teaching time or supervisory time of PA/NP/Med student/Med Resident etc but could involve care discussion time.       [name]  Pulmonary and Critical Care Medicine 02/16/2024 9:13 AM  Pager: see AMION  If no  response to pager , please call critical care on call (see AMION) until 7pm After 7:00 pm call Elink     Labs   CBC: Recent Labs  Lab 02/15/24 0659 02/15/24 0751 02/15/24 1050 02/15/24 1128 02/15/24 1508 02/16/24 0613  WBC 8.0  --   --   --   --  19.4*  NEUTROABS 6.4  --   --   --   --   --   HGB 11.1* 11.2* 9.2* 9.9* 10.5* 9.7*  HCT 35.5* 33.0* 27.0* 29.0* 31.0* 31.1*  MCV 94.7  --   --   --   --  94.0  PLT 412*  --   --   --   --  359    Basic Metabolic Panel: Recent Labs  Lab 02/15/24 0653 02/15/24 0659 02/15/24 0751 02/15/24 1050 02/15/24 1128 02/15/24 1336 02/15/24 1508 02/15/24 1620 02/16/24 0613  NA 136 135 137 139 139 138 141 140 148*  K 3.1* 3.3* 2.8* 3.4* 3.9  --  3.9  --  3.7  CL 104 101  --  100  --   --   --   --  118*  CO2  --  22  --   --   --   --   --   --  22  GLUCOSE 216* 216*  --  136*  --   --   --   --  133*  BUN 8 9  --  8  --   --   --   --  8  CREATININE 0.70 0.85  --  0.70  --   --   --   --  0.78  CALCIUM  --  8.7*  --   --   --   --   --   --  8.1*  MG  --   --   --   --   --   --   --   --  2.0  PHOS  --   --   --   --   --   --   --   --  3.0   GFR: Estimated Creatinine Clearance: 112.4 mL/min (by C-G formula based on SCr of 0.78 mg/dL). Recent Labs  Lab 02/15/24 0659 02/15/24 1336 02/16/24 0613  PROCALCITON  --  <0.10  --   WBC 8.0  --  19.4*    Liver Function Tests: Recent Labs  Lab 02/15/24 0659  AST 20  ALT 12  ALKPHOS 67  BILITOT 0.5  PROT 7.4  ALBUMIN 3.6   No results for input(s): LIPASE, AMYLASE in the last 168 hours. No results for input(s): AMMONIA in the last 168 hours.  ABG    Component Value Date/Time   PHART 7.387 02/15/2024 1508   PCO2ART 42.1 02/15/2024 1508   PO2ART 259 (H) 02/15/2024 1508   HCO3 25.4 02/15/2024 1508   TCO2 27 02/15/2024 1508   ACIDBASEDEF 1.0 02/15/2024 1128   O2SAT 100 02/15/2024 1508     Coagulation Profile: Recent Labs  Lab 02/15/24 1316  INR 1.0     Cardiac Enzymes: No results for input(s): CKTOTAL, CKMB, CKMBINDEX, TROPONINI in the last 168 hours.  HbA1C: Hgb A1c MFr Bld  Date/Time Value Ref Range Status  05/07/2023 08:45 AM 5.7 (H) 4.8 - 5.6 % Final    Comment:             Prediabetes: 5.7 - 6.4          Diabetes: >6.4          Glycemic control for adults with diabetes: <7.0     CBG: Recent Labs  Lab 02/15/24 1557 02/15/24 1953 02/15/24 2334 02/16/24 0334 02/16/24 0739  GLUCAP 125* 125* 130* 137* 134*

## 2024-02-16 NOTE — Progress Notes (Addendum)
 STROKE TEAM PROGRESS NOTE   SUBJECTIVE (INTERVAL HISTORY) Patient remains hemodynamically stable and afebrile overnight.  She does exhibit some purposeful movement of right upper extremity but is unable to follow commands today.  OBJECTIVE Temp:  [98 F (36.7 C)-99.2 F (37.3 C)] 98.6 F (37 C) (08/03 1200) Pulse Rate:  [81-95] 86 (08/03 1130) Cardiac Rhythm: Normal sinus rhythm (08/02 2000) Resp:  [8-23] 23 (08/03 1130) BP: (114-154)/(56-76) 124/62 (08/03 1130) SpO2:  [95 %-100 %] 100 % (08/03 1130) Arterial Line BP: (135-155)/(56-74) 148/61 (08/03 0515) FiO2 (%):  [40 %] 40 % (08/03 0813)  Recent Labs  Lab 02/15/24 1953 02/15/24 2334 02/16/24 0334 02/16/24 0739 02/16/24 1139  GLUCAP 125* 130* 137* 134* 96   Recent Labs  Lab 02/15/24 0653 02/15/24 0659 02/15/24 0751 02/15/24 1050 02/15/24 1128 02/15/24 1336 02/15/24 1508 02/15/24 1620 02/16/24 0613 02/16/24 1142  NA 136 135 137 139 139 138 141 140 148* 149*  K 3.1* 3.3* 2.8* 3.4* 3.9  --  3.9  --  3.7  --   CL 104 101  --  100  --   --   --   --  118*  --   CO2  --  22  --   --   --   --   --   --  22  --   GLUCOSE 216* 216*  --  136*  --   --   --   --  133*  --   BUN 8 9  --  8  --   --   --   --  8  --   CREATININE 0.70 0.85  --  0.70  --   --   --   --  0.78  --   CALCIUM  --  8.7*  --   --   --   --   --   --  8.1*  --   MG  --   --   --   --   --   --   --   --  2.0  --   PHOS  --   --   --   --   --   --   --   --  3.0  --    Recent Labs  Lab 02/15/24 0659  AST 20  ALT 12  ALKPHOS 67  BILITOT 0.5  PROT 7.4  ALBUMIN 3.6   Recent Labs  Lab 02/15/24 0659 02/15/24 0751 02/15/24 1050 02/15/24 1128 02/15/24 1508 02/16/24 0613  WBC 8.0  --   --   --   --  19.4*  NEUTROABS 6.4  --   --   --   --   --   HGB 11.1* 11.2* 9.2* 9.9* 10.5* 9.7*  HCT 35.5* 33.0* 27.0* 29.0* 31.0* 31.1*  MCV 94.7  --   --   --   --  94.0  PLT 412*  --   --   --   --  359   No results for input(s): CKTOTAL, CKMB,  CKMBINDEX, TROPONINI in the last 168 hours. Recent Labs    02/15/24 1316  LABPROT 14.0  INR 1.0   No results for input(s): COLORURINE, LABSPEC, PHURINE, GLUCOSEU, HGBUR, BILIRUBINUR, KETONESUR, PROTEINUR, UROBILINOGEN, NITRITE, LEUKOCYTESUR in the last 72 hours.  Invalid input(s): APPERANCEUR     Component Value Date/Time   CHOL 132 02/16/2024 0613   CHOL 135 05/07/2023 0845   TRIG 131 02/16/2024 0613   HDL  32 (L) 02/16/2024 0613   HDL 45 05/07/2023 0845   CHOLHDL 4.1 02/16/2024 0613   VLDL 26 02/16/2024 0613   LDLCALC 74 02/16/2024 0613   LDLCALC 78 05/07/2023 0845   Lab Results  Component Value Date   HGBA1C 5.7 (H) 05/07/2023      Component Value Date/Time   LABOPIA NONE DETECTED 02/15/2024 1654   COCAINSCRNUR NONE DETECTED 02/15/2024 1654   LABBENZ NONE DETECTED 02/15/2024 1654   AMPHETMU NONE DETECTED 02/15/2024 1654   THCU NONE DETECTED 02/15/2024 1654   LABBARB NONE DETECTED 02/15/2024 1654    Recent Labs  Lab 02/15/24 0702  ETH <15    I have personally reviewed the radiological images below and agree with the radiology interpretations.  CT HEAD WO CONTRAST ( ) Result Date: 02/16/2024 CLINICAL DATA:  48 year old female who presented status post under clap headache with severe intracranial hemorrhage, large right temporal lobe hematoma, overlying subdural hematoma, subarachnoid blood. 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 EXAM: CT HEAD WITHOUT CONTRAST TECHNIQUE: Contiguous axial images were obtained from the base of the skull through the vertex without intravenous contrast. RADIATION DOSE REDUCTION: This exam was performed according to the departmental dose-optimization program which includes automated exposure control, adjustment of the mA and/or kV according to patient size and/or use of iterative reconstruction technique. COMPARISON:  Presentation head  CT 0658 hours yesterday. FINDINGS: Brain: Post craniotomy changes with hemostatic low-density material underlying the flap and superimposed small volume pneumocephalus. Debulking of right temporal lobe intra-axial hemorrhage. Surgical aneurysm type clip along the anterior superior margin of the residual hematoma on series 3, image 10. Right side subdural hematoma is smaller, maximal 3-4 mm thickness now as seen on coronal image 45 along the lateral frontal convexity. Subarachnoid blood, which was previously most pronounced in the basilar cisterns and interpeduncular cistern has regressed. Small volume IVH now layering in the occipital horns. Regressed intracranial mass effect. Leftward midline shift decreased, now 4-6 mm, previously 6-9 mm. Compare coronal image 37 now to coronal image 38 yesterday. Improved basilar cistern patency. No ventriculomegaly. Regressed cerebral edema. No brainstem hemorrhage identified (artifacts suspected in the right brainstem yesterday). Temporal lobe are edema on the right persists. Elsewhere gray-white differentiation maintained. Vascular: No suspicious intracranial vascular hyperdensity. Skull: New right side craniotomy.  Otherwise intact. Sinuses/Orbits: Intubated. Visualized paranasal sinuses and mastoids are stable and well aerated. Other: New postoperative changes right scalp. Skin staples in place. Disconjugate gaze, orbits soft tissues appear negative. IMPRESSION: 1. Satisfactory appearance on postoperative day 1. Substantial evacuation of right temporal lobe hematoma. Decreased right SDH, basilar cistern predominant SAH. And decreased intracranial mass effect. Residual leftward midline shift now 4-6 mm. Improved basilar cistern patency. Small volume new IVH without ventriculomegaly. 2. Right temporal lobe surgical vascular clips in place. Residual right temporal lobe edema, but no other cerebral edema or infarction identified. Negative for brainstem hemorrhage. 3. No new  intracranial abnormality. Electronically Signed   By: VEAR Hurst M.D.   On: 02/16/2024 06:34   DG Abd 1 View Result Date: 02/15/2024 CLINICAL DATA:  Enteric catheter placement EXAM: ABDOMEN - 1 VIEW COMPARISON:  None Available. FINDINGS: Frontal view of the lower chest and upper abdomen demonstrates enteric catheter passing below diaphragm, tip projecting over the gastric body in this patient with heterotaxy with right-sided spleen and stomach noted on previous CT. Unremarkable bowel gas pattern. Lung bases are clear. IMPRESSION: 1. Enteric catheter tip projecting over the gastric  body. Electronically Signed   By: Ozell Daring M.D.   On: 02/15/2024 16:30   DG CHEST PORT 1 VIEW Result Date: 02/15/2024 CLINICAL DATA:  48 year old female intubated. Severe intracranial hemorrhage. Abdominal heterotaxy syndrome. EXAM: PORTABLE CHEST 1 VIEW COMPARISON:  CTA neck today.  Chest radiographs 08/06/2023. CT Abdomen and Pelvis 12/14/2023. FINDINGS: Portable AP semi upright view at 0824 hours. Endotracheal tube tip in good position between the clavicles and carina on this image. Enteric tube courses to the abdomen, tip is in the right upper quadrant compatible with satisfactory gastric placement when compared to CT Abdomen and Pelvis 12/14/2023 demonstrating abnormal upper abdominal situs. Mildly lower lung volumes compared to January. Normal cardiac size and mediastinal contours. No pneumothorax, pulmonary edema. No definite pleural effusion or consolidation. Paucity of bowel gas in the upper abdomen. No acute osseous abnormality identified. IMPRESSION: 1. Satisfactory ET tube and enteric tube placement. 2. Lower lung volumes, no acute cardiopulmonary abnormality. 3. Incidental abnormal upper situs abnormality demonstrated on prior CT Abdomen and Pelvis. Electronically Signed   By: VEAR Hurst M.D.   On: 02/15/2024 08:45   CT HEAD CODE STROKE WO CONTRAST Addendum Date: 02/15/2024 ADDENDUM REPORT: 02/15/2024 07:35 ADDENDUM:  Critical Value/emergent results were called by telephone at the time of interpretation on 02/15/2024 at 0717 hours to Dr. Aisha Seals who verbally acknowledged these results. Electronically Signed   By: VEAR Hurst M.D.   On: 02/15/2024 07:35   Result Date: 02/15/2024 CLINICAL DATA:  Code stroke. 48 year old female neurologic deficit. Intubated. EXAM: CT HEAD WITHOUT CONTRAST TECHNIQUE: Contiguous axial images were obtained from the base of the skull through the vertex without intravenous contrast. RADIATION DOSE REDUCTION: This exam was performed according to the departmental dose-optimization program which includes automated exposure control, adjustment of the mA and/or kV according to patient size and/or use of iterative reconstruction technique. COMPARISON:  None Available. FINDINGS: Brain: Large and hyperdense intra-axial hemorrhage throughout the right temporal lobe. Substantial superimposed extension into the subdural space suspected including along the right tentorium, at the floor of the right middle cranial fossa. See coronal images 27, 32. Additionally, there is combined hyperdense and isodense widespread right hemispheric subdural hematoma which is up to 7 mm thickness on the coronal images, especially along the anterior right frontal convexity. This is subtle on axial images. See coronal image 47. Large and lobulated intra-axial hematoma encompasses 73 x 47 by 44 mm (AP by transverse by CC) for an estimated intra-axial volume of 75 mL. Additionally, a comparatively small volume of subarachnoid hemorrhage is demonstrated in the posterior fossa, especially the prepontine cistern. And furthermore, possible small acute Duret hemorrhagein the brainstem on series 2, image 10. Confluent surrounding right temporal lobe edema. Intracranial mass effect with leftward midline shift of 9 mm. Early trapping of the left lateral ventricle, dilated left temporal horn. Uncal herniation. All basilar cisterns are  partially effaced. Small left basal ganglia vascular calcification. Vascular: No suspicious intracranial vascular hyperdensity. Skull: Intact. Sinuses/Orbits: Visualized paranasal sinuses and mastoids are stable and well aerated. Other: Mildly Disconjugate gaze. Visualized scalp soft tissues are within normal limits. ASPECTS [facility] Stroke Program Early CT Score) Total score (0-10 with 10 being normal): Not applicable, acute hemorrhage. IMPRESSION: 1. Severe acute intracranial hemorrhage, probably originated as intra-axial bleed into the right temporal lobe, with large hematoma there estimated at 75 mL. But also substantial extension into the right subdural space (up to 7 mm thickness), and subarachnoid space extension. Possible Duret hemorrhagein the right brainstem also. 2.  Intracranial mass effect with widespread cerebral edema. Right uncal herniation. Trapped left lateral ventricle. Effaced basilar cisterns. Electronically Signed: By: VEAR Hurst M.D. On: 02/15/2024 07:10   CT ANGIO HEAD NECK W WO CM (CODE STROKE) Result Date: 02/15/2024 CLINICAL DATA:  48 year old female code stroke presentation with severe intracranial hemorrhage. Thunderclap headache reported. EXAM: CT ANGIOGRAPHY HEAD AND NECK TECHNIQUE: Multidetector CT imaging of the head and neck was performed using the standard protocol during bolus administration of intravenous contrast. Multiplanar CT image reconstructions and MIPs were obtained to evaluate the vascular anatomy. Carotid stenosis measurements (when applicable) are obtained utilizing NASCET criteria, using the distal internal carotid diameter as the denominator. RADIATION DOSE REDUCTION: This exam was performed according to the departmental dose-optimization program which includes automated exposure control, adjustment of the mA and/or kV according to patient size and/or use of iterative reconstruction technique. CONTRAST:  75mL OMNIPAQUE  IOHEXOL  350 MG/ML SOLN COMPARISON:  Noncontrast  head CT 0658 hours today. FINDINGS: CTA NECK Skeleton: No acute osseous abnormality identified. Upper chest: Intubated. Endotracheal tube tip may extend to the carina, correlate with dedicated chest x-ray. Ground-glass opacity in the upper lungs. Other neck: Satisfactory endotracheal tube course in the neck. Left nasal airway also in place. Fluid in the pharynx. Nonvascular neck soft tissue spaces otherwise within normal limits. Aortic arch: 3 vessel arch appears normal. Right carotid system: Negative right CCA and right carotid bifurcation. Right ICA has a tapered appearance in the neck as it approaches the skull base. Left carotid system: Negative left CCA and left carotid bifurcation. Left ICA patent to the skull base, less tapered appearance. Vertebral arteries: Proximal right subclavian artery and cervical right vertebral arteries are normal. Proximal left subclavian artery and cervical left vertebral arteries are normal, the left vertebral is mildly non dominant. CTA HEAD Posterior circulation: Patent but diminutive vertebrobasilar system, no discrete atherosclerosis or stenosis. Bilateral PICA, SCA and PCA origins remain patent. Right posterior communicating artery is patent. Left posterior communicating artery appears diminutive. Bilateral PCA branches are patent without stenosis. Anterior circulation: Right ICA siphon is tapered and diminutive, and functionally terminates at the right posterior communicating artery, either spasmed or occluded at the right ICA terminus. Likewise, severe spasm or occlusion of the right MCA and ACA origins, A1 and M1 segments. There is regional mass effect there related to the right temporal lobe hematoma. Distally right MCA branches are filling and enhancing relatively symmetric to the left side (series 9, image 11) although with substantial mass effect from the right temporal lobe hemorrhage. Also, anterior communicating artery and bilateral ACA branches are within normal  limits. Contralateral left ICA siphon is patent to the terminus with a more normal appearance. Left MCA and ACA origins appear normal. The left A1 and M1 appear normal. Left MCA bifurcation and branches appear patent and within normal limits. Additionally, positive CTA spot sign along the right inferior right temporal lobe hemorrhage site, see series 13, image 109 and series 10, image 116. This is well away from the abnormally attenuated and elevated right MCA branches. Venous sinuses: Early contrast timing, not well evaluated. Anatomic variants: Dominant right vertebral artery. Review of the MIP images confirms the above findings IMPRESSION: 1. Severe intracranial hemorrhage with Positive CTA Spot Sign along the right inferior temporal lobe intra-axial hematoma. Nonvisualization of the Right ICA terminus and right M1 and A1 segments. These could be occluded or severely spasmed. Associated severe mass effect on the Right ICA terminus and Right MCA branches from the intracranial hemorrhage.  No discrete intracranial aneurysm identified. 2. Posterior circulation and left anterior circulation appear more normal, however, generally diminutive. And this may also be an indication of severely elevated intracranial pressure. 3. No superimposed atherosclerosis identified. 4. The above discussed by telephone with Dr. AISHA SEALS on 02/15/2024 at 0728 hours. 5. Additionally, endotracheal tube tip may be at the carina, not included on the CT imaging. Recommend portable chest x-ray when feasible. Electronically Signed   By: VEAR Hurst M.D.   On: 02/15/2024 07:35     PHYSICAL EXAM  Temp:  [98 F (36.7 C)-99.2 F (37.3 C)] 98.6 F (37 C) (08/03 1200) Pulse Rate:  [81-95] 86 (08/03 1130) Resp:  [8-23] 23 (08/03 1130) BP: (114-154)/(56-76) 124/62 (08/03 1130) SpO2:  [95 %-100 %] 100 % (08/03 1130) Arterial Line BP: (135-155)/(56-74) 148/61 (08/03 0515) FiO2 (%):  [40 %] 40 % (08/03 0813)  General - Well nourished,  well developed, intubated on sedation and in no acute distress.  Cardiovascular - Regular rhythm with bradycardia.  Neuro - intubated on sedation, rest with eyes open and is unable to respond to name or follow commands.  She does exhibit some purposeful movement of the right upper extremity and will localize with right upper extremity.  No movement of left upper extremity and withdraws bilateral lower extremities to noxious.  Pupils are small but reactive, cough and gag are present, weak oculocephalic reflex present, corneal reflex weak on the left and strong on the right   ASSESSMENT/PLAN [name] is a 47 y.o. female with history of interstitial lung disease on CellCept, hypertension, hyperlipidemia, asthma admitted for headache, nausea vomiting and altered mental status. No TNK given due to [facility].    ICH:  right temporal large ICH with SDH, etiology unclear CT head right temporal large ICH with SDH, possible pseudo SAH, Duret hemorrhage with cerebral edema CT head and neck possible spot sign, right MCA occlusion versus vasospasm versus moyamoya syndrome Status post hematoma evacuation CT head substantial evacuation of right temporal lobe hematoma, decreased right SDH, basilar cistern predominant SAH and decreased mass effect, residual leftward midline shift 4 to 6 mm, stable volume new IVH without ventriculomegaly, right temporal lobe surgical vascular clips in place MRI brain and MRA head pending 2D Echo pending LDL 74 HgbA1c pending UDS negative SCDs for VTE prophylaxis No antithrombotic prior to admission, now on No antithrombotic due to ICH Ongoing aggressive stroke risk factor management Therapy recommendations: Pending Disposition: Pending  Cerebral edema Brain herniation CT head showed intracranial mass effect with widespread cerebral edema, right uncal herniation, trapped left lateral ventricle, effaced basilar cisterns, possible Duret hemorrhage in the right  brainstem Status post hematoma evacuation with Dr. Lanis CT head substantial evacuation of right temporal lobe hematoma, decreased right SDH, basilar cistern predominant SAH and decreased mass effect, residual leftward midline shift 4 to 6 mm, stable volume new IVH without ventriculomegaly, right temporal lobe surgical vascular clips in place Status post 23.4% bolus 8/2 Now on 3% saline @ 75 Na Q6 with goal 150-155 Na 137--141--140--149  Respiratory failure Intubated on sedation Vent management per CCM  ? 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   Other Active Problems Interstitial lung disease on CellCept Asthma  Hospital day # 1  Patient seen by NP with MD, MD to edit note as needed. Cortney E Everitt Clint Kill , MSN, AGACNP-BC [facility] See Amion for schedule and pager information 02/16/2024 1:35 PM   ATTENDING NOTE: I reviewed above note and agree with the assessment and plan. Pt was seen and examined.   Pt wife and mom are at the bedside. Pt is still intubated on sedation, R eye closed with L eye half way open, not following commands. With forced eye opening, eyes in mid position, not blinking to visual threat, doll's eyes absent, not tracking, pupils 2mm bilateral, sluggish to light. Corneal reflex weakly present on the left but present on the right, gag and cough present. Breathing over the vent.  Facial symmetry not able to test due to ET tube.  Tongue protrusion not cooperative. On  pain stimulation, RUE against gravity with purposeful movement, RLE mild withdraw to pain. LUE and LLE no movement with painful stimulation. Sensation, coordination and gait not tested.   Per Dr. 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 CT today showed substantial evacuation of large right temproal ICH but still has 4-6 mm MLS, will do MRI and MRA in am. Continue 3% saline with Na gaol 150-155. Vent management per CCM. Still has high BP, no high dose of cleviprex , add po BP meds today. Continue monitoring.   For detailed assessment and plan, please refer to above as I have made changes wherever appropriate.   Ary Cummins, MD PhD Stroke Neurology 02/16/2024 5:51 PM  This patient is critically ill due to large ICH and SDH status post hematoma evacuation, respiratory failure, cerebral edema and at significant risk of neurological worsening, death form brain herniation, brain death. This patient's care requires constant monitoring of vital signs, hemodynamics, respiratory and cardiac monitoring, review of multiple databases, neurological assessment, discussion with family, other specialists and medical decision making of high complexity. I spent 35 minutes of neurocritical care time in the care of this patient. I had long discussion with her wife at bedside, updated pt current condition, treatment plan and potential prognosis, and answered all the questions. She expressed understanding and appreciation. I also discussed with Dr. Lanis NSG.

## 2024-02-16 NOTE — Progress Notes (Signed)
 Pt transported to CT on ventilator without any complications by this RT and 2xRN.  PRVC 500/20/8/ 100%

## 2024-02-17 ENCOUNTER — Inpatient Hospital Stay (HOSPITAL_COMMUNITY)

## 2024-02-17 ENCOUNTER — Encounter (HOSPITAL_COMMUNITY): Payer: Self-pay | Admitting: Neurosurgery

## 2024-02-17 ENCOUNTER — Encounter (HOSPITAL_COMMUNITY): Payer: Self-pay

## 2024-02-17 DIAGNOSIS — D72829 Elevated white blood cell count, unspecified: Secondary | ICD-10-CM

## 2024-02-17 DIAGNOSIS — R1310 Dysphagia, unspecified: Secondary | ICD-10-CM

## 2024-02-17 DIAGNOSIS — J9602 Acute respiratory failure with hypercapnia: Secondary | ICD-10-CM | POA: Diagnosis not present

## 2024-02-17 DIAGNOSIS — I6389 Other cerebral infarction: Secondary | ICD-10-CM

## 2024-02-17 DIAGNOSIS — I611 Nontraumatic intracerebral hemorrhage in hemisphere, cortical: Secondary | ICD-10-CM | POA: Diagnosis not present

## 2024-02-17 DIAGNOSIS — J9601 Acute respiratory failure with hypoxia: Secondary | ICD-10-CM | POA: Diagnosis not present

## 2024-02-17 DIAGNOSIS — R509 Fever, unspecified: Secondary | ICD-10-CM

## 2024-02-17 DIAGNOSIS — R4182 Altered mental status, unspecified: Secondary | ICD-10-CM

## 2024-02-17 DIAGNOSIS — G936 Cerebral edema: Secondary | ICD-10-CM | POA: Diagnosis not present

## 2024-02-17 DIAGNOSIS — G935 Compression of brain: Secondary | ICD-10-CM | POA: Diagnosis not present

## 2024-02-17 DIAGNOSIS — I6200 Nontraumatic subdural hemorrhage, unspecified: Secondary | ICD-10-CM | POA: Diagnosis not present

## 2024-02-17 DIAGNOSIS — I619 Nontraumatic intracerebral hemorrhage, unspecified: Secondary | ICD-10-CM | POA: Diagnosis not present

## 2024-02-17 LAB — BASIC METABOLIC PANEL WITH GFR
Anion gap: 6 (ref 5–15)
BUN: 13 mg/dL (ref 6–20)
CO2: 23 mmol/L (ref 22–32)
Calcium: 7.9 mg/dL — ABNORMAL LOW (ref 8.9–10.3)
Chloride: 126 mmol/L — ABNORMAL HIGH (ref 98–111)
Creatinine, Ser: 0.92 mg/dL (ref 0.44–1.00)
GFR, Estimated: >60 mL/min (ref >60)
Glucose, Bld: 117 mg/dL — ABNORMAL HIGH (ref 70–99)
Potassium: 3.6 mmol/L (ref 3.5–5.1)
Sodium: 155 mmol/L — ABNORMAL HIGH (ref 135–145)

## 2024-02-17 LAB — GLUCOSE, CAPILLARY
Glucose-Capillary: 102 mg/dL — ABNORMAL HIGH (ref 70–99)
Glucose-Capillary: 116 mg/dL — ABNORMAL HIGH (ref 70–99)
Glucose-Capillary: 117 mg/dL — ABNORMAL HIGH (ref 70–99)
Glucose-Capillary: 120 mg/dL — ABNORMAL HIGH (ref 70–99)
Glucose-Capillary: 121 mg/dL — ABNORMAL HIGH (ref 70–99)
Glucose-Capillary: 63 mg/dL — ABNORMAL LOW (ref 70–99)
Glucose-Capillary: 77 mg/dL (ref 70–99)

## 2024-02-17 LAB — SODIUM
Sodium: 154 mmol/L — ABNORMAL HIGH (ref 135–145)
Sodium: 159 mmol/L — ABNORMAL HIGH (ref 135–145)
Sodium: 156 mmol/L — ABNORMAL HIGH (ref 135–145)

## 2024-02-17 LAB — ECHOCARDIOGRAM COMPLETE
AR max vel: 2.32 cm2
AV Area VTI: 2.43 cm2
AV Area mean vel: 2.39 cm2
AV Mean grad: 22.3 mmHg
AV Peak grad: 41.6 mmHg
Ao pk vel: 3.23 m/s
Area-P 1/2: 6.54 cm2
Est EF: >75
Height: 67.008 in
S' Lateral: 2.6 cm
Weight: 3880.1 [oz_av]

## 2024-02-17 LAB — HEMOGLOBIN A1C
Hgb A1c MFr Bld: 5.3 % (ref 4.8–5.6)
Mean Plasma Glucose: 105 mg/dL

## 2024-02-17 LAB — CBC
HCT: 29.3 % — ABNORMAL LOW (ref 36.0–46.0)
Hemoglobin: 8.6 g/dL — ABNORMAL LOW (ref 12.0–15.0)
MCH: 29.2 pg (ref 26.0–34.0)
MCHC: 29.4 g/dL — ABNORMAL LOW (ref 30.0–36.0)
MCV: 99.3 fL (ref 80.0–100.0)
Platelets: 337 K/uL (ref 150–400)
RBC: 2.95 MIL/uL — ABNORMAL LOW (ref 3.87–5.11)
RDW: 13.9 % (ref 11.5–15.5)
WBC: 16.5 K/uL — ABNORMAL HIGH (ref 4.0–10.5)
nRBC: 0 % (ref 0.0–0.2)

## 2024-02-17 LAB — PHOSPHORUS: Phosphorus: 2.6 mg/dL (ref 2.5–4.6)

## 2024-02-17 LAB — MAGNESIUM: Magnesium: 2 mg/dL (ref 1.7–2.4)

## 2024-02-17 MED ORDER — DEXTROSE 50 % IV SOLN
25.0000 mL | Freq: Once | INTRAVENOUS | Status: AC
  Administered 2024-02-17: 25 mL via INTRAVENOUS

## 2024-02-17 MED ORDER — OSMOLITE 1.5 CAL PO LIQD
1000.0000 mL | ORAL | Status: DC
  Administered 2024-02-17 – 2024-03-02 (×12): 1000 mL

## 2024-02-17 MED ORDER — DEXTROSE 50 % IV SOLN
INTRAVENOUS | Status: AC
  Filled 2024-02-17: qty 50

## 2024-02-17 MED ORDER — PROSOURCE TF20 ENFIT COMPATIBL EN LIQD
60.0000 mL | Freq: Two times a day (BID) | ENTERAL | Status: DC
  Administered 2024-02-17 – 2024-03-04 (×38): 60 mL
  Filled 2024-02-17 (×32): qty 60

## 2024-02-17 MED ORDER — SENNOSIDES-DOCUSATE SODIUM 8.6-50 MG PO TABS: 1.0000 | ORAL_TABLET | Freq: Two times a day (BID) | ORAL | Status: DC

## 2024-02-17 MED ORDER — ADULT MULTIVITAMIN W/MINERALS CH
1.0000 | ORAL_TABLET | Freq: Every day | ORAL | Status: DC
  Administered 2024-02-17 – 2024-03-04 (×20): 1
  Filled 2024-02-17 (×17): qty 1

## 2024-02-17 MED ORDER — GADOBUTROL 1 MMOL/ML IV SOLN
10.0000 mL | Freq: Once | INTRAVENOUS | Status: AC | PRN
  Administered 2024-02-17: 10 mL via INTRAVENOUS

## 2024-02-17 MED ORDER — ACETAMINOPHEN 160 MG/5ML PO SOLN
650.0000 mg | Freq: Four times a day (QID) | ORAL | Status: DC | PRN
  Administered 2024-02-17 – 2024-03-04 (×39): 650 mg
  Filled 2024-02-17 (×36): qty 20.3

## 2024-02-17 MED ORDER — PROSOURCE TF20 ENFIT COMPATIBL EN LIQD
60.0000 mL | Freq: Every day | ENTERAL | Status: DC
  Administered 2024-02-17: 60 mL
  Filled 2024-02-17: qty 60

## 2024-02-17 MED ORDER — OSMOLITE 1.5 CAL PO LIQD: 1000.0000 mL | ORAL | Status: DC

## 2024-02-17 MED ORDER — SENNOSIDES-DOCUSATE SODIUM 8.6-50 MG PO TABS
1.0000 | ORAL_TABLET | Freq: Two times a day (BID) | ORAL | Status: DC
  Administered 2024-02-17 – 2024-02-29 (×25): 1
  Filled 2024-02-17 (×20): qty 1

## 2024-02-17 MED ORDER — SODIUM CHLORIDE 0.9 % IV SOLN
2.0000 g | INTRAVENOUS | Status: AC
  Administered 2024-02-17 – 2024-02-23 (×7): 2 g via INTRAVENOUS
  Filled 2024-02-17 (×7): qty 20

## 2024-02-17 MED ORDER — HYDRALAZINE HCL 50 MG PO TABS
50.0000 mg | ORAL_TABLET | Freq: Four times a day (QID) | ORAL | Status: DC
  Administered 2024-02-17 – 2024-02-23 (×24): 50 mg
  Filled 2024-02-17 (×24): qty 1

## 2024-02-17 MED ORDER — VITAL HP 1.0 CAL PO LIQD: 1000.0000 mL | ORAL | Status: DC

## 2024-02-17 MED ORDER — HYDRALAZINE HCL 50 MG PO TABS: 50.0000 mg | ORAL_TABLET | Freq: Four times a day (QID) | ORAL | Status: DC

## 2024-02-17 MED ORDER — POLYETHYLENE GLYCOL 3350 17 G PO PACK
17.0000 g | PACK | Freq: Every day | ORAL | Status: DC
  Administered 2024-02-17 – 2024-02-26 (×11): 17 g
  Filled 2024-02-17 (×8): qty 1

## 2024-02-17 NOTE — Progress Notes (Addendum)
 STROKE TEAM PROGRESS NOTE   SUBJECTIVE (INTERIM HISTORY) The patient was agitated overnight and hypertensive, with blood pressure at the upper limit of normal. However, this morning, she is no longer exhibiting purposeful movement and remains unable to follow commands. We will decrease sedation and monitor for increase responsiveness.   OBJECTIVE Vitals:   02/17/24 1145 02/17/24 1200 02/17/24 1215 02/17/24 1230  BP: (!) 144/63 (!) 147/62 (!) 146/58   Pulse: (!) 109 (!) 108 (!) 109 (!) 112  Resp: 18 19 18 18   Temp:      TempSrc:      SpO2: 100% 100% 100% 100%  Weight:      Height:         CBC    Component Value Date/Time   WBC 16.5 (H) 02/17/2024 0626   RBC 2.95 (L) 02/17/2024 0626   HGB 8.6 (L) 02/17/2024 0626   HCT 29.3 (L) 02/17/2024 0626   PLT 337 02/17/2024 0626   MCV 99.3 02/17/2024 0626   MCH 29.2 02/17/2024 0626   MCHC 29.4 (L) 02/17/2024 0626   RDW 13.9 02/17/2024 0626   LYMPHSABS 1.0 02/15/2024 0659   MONOABS 0.6 02/15/2024 0659   EOSABS 0.0 02/15/2024 0659   BASOSABS 0.0 02/15/2024 0659    BMET    Component Value Date/Time   NA 155 (H) 02/17/2024 0626   NA 137 05/07/2023 0845   K 3.6 02/17/2024 0626   CL 126 (H) 02/17/2024 0626   CO2 23 02/17/2024 0626   GLUCOSE 117 (H) 02/17/2024 0626   BUN 13 02/17/2024 0626   BUN 15 05/07/2023 0845   CREATININE 0.92 02/17/2024 0626   CREATININE 0.74 08/19/2023 1015   CALCIUM 7.9 (L) 02/17/2024 0626   EGFR 102 08/19/2023 1015   EGFR 91 05/07/2023 0845   GFRNONAA >60 02/17/2024 0626    IMAGING past 24 hours MR BRAIN W WO CONTRAST Result Date: 02/17/2024 EXAM: MRI BRAIN WITH AND WITHOUT CONTRAST 02/17/2024 05:18:21 AM TECHNIQUE: Multiplanar multisequence MRI of the head/brain was performed with and without the administration of intravenous contrast. COMPARISON: CT of the head dated 02/16/2024. CLINICAL HISTORY: Stroke, hemorrhagic. FINDINGS: BRAIN AND VENTRICLES: There are areas of restricted diffusion present within  the right temporal lobe, surrounding an intraparenchymal hemorrhage. There are also acute infarcts demonstrated within the insular ribbon, right thalamus posterior limb of the internal capsule and posterior limb of the left internal capsule. There is moderate cerebral swelling of the right temporal lobe. There is no evidence of underlying mass in the right cerebral hemisphere. No hydrocephalus. The sella is unremarkable. Normal flow voids. ORBITS: No acute abnormality. SINUSES: There is mucosal disease within the left maxillary sinus and within the sphenoid sinuses. BONES AND SOFT TISSUES: Normal bone marrow signal and enhancement. No acute soft tissue abnormality. The patient is status post right temporoparietal craniotomy. IMPRESSION: 1. Acute infarcts in the insular ribbon, right thalamus, posterior limb of the internal capsule, and posterior limb of the left internal capsule. 2. Right temporal lobe intraparenchymal hemorrhage with surrounding restricted diffusion and moderate cerebral swelling. 3. Status post right temporoparietal craniotomy. No evidence of underlying mass in the right cerebral hemisphere. 4. Mucosal disease within the left maxillary sinus and within the sphenoid sinuses. Electronically signed by: evalene coho 02/17/2024 09:55 AM EDT RP Workstation: [id number]   MR ANGIO HEAD WO CONTRAST Result Date: 02/17/2024 EXAM: MR Angiography Head without intravenous Contrast. 02/17/2024 05:18:52 AM TECHNIQUE: Magnetic resonance angiography images of the head without intravenous contrast. Multiplanar 2D and 3D reformatted images  are provided for review. COMPARISON: CT angiogram of the head dated 02/17/2024. CLINICAL HISTORY: Stroke, hemorrhagic. 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 The right A1 branch is nearly occluded. The proximal M1  branch is occluded. There is reconstitution of M2 branches in the sylvian fissure via collaterals. POSTERIOR CIRCULATION: Fetal type origin of the right posterior cerebral artery. IMPRESSION: 1. 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 2. Nearly occluded right A1 branch. 3. Occluded proximal M1 branch with reconstitution of M2 branches in the sylvian fissure via collaterals. 4. Fetal type origin of the right posterior cerebral artery. Electronically signed by: evalene berrigan 02/17/2024 08:03 AM EDT RP Workstation: [id number]    PHYSICAL EXAM  Vitals:   02/17/24 1145 02/17/24 1200 02/17/24 1215 02/17/24 1230  BP: (!) 144/63 (!) 147/62 (!) 146/58   Pulse: (!) 109 (!) 108 (!) 109 (!) 112  Resp: 18 19 18 18   Temp:      TempSrc:      SpO2: 100% 100% 100% 100%  Weight:      Height:         General - Well nourished, well developed, intubated on sedation.   Cardiovascular - Regular rhythm with tachycardia   Neuro - intubated on sedation, rest with eyes closed and is unable to respond to name or follow commands.  No movement of right or left upper extremity.  No movement of the bilateral lower extremities to noxious.  Pupils are small and non-reactive, cough and gag are present, no doll's eyes, corneal reflex absent bilaterally. Sensation, coordination and gait not tested.    ASSESSMENT/PLAN [name] is a 48 y.o. female with history of interstitial lung disease on CellCept, hypertension, hyperlipidemia, asthma admitted for headache, nausea vomiting and altered mental status. No TNK given due to [facility].  We will plan to decrease sedation and monitor to see if she becomes more responsive and we will increase antihypertensives with the plan to do a CT in the morning.   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  right MCA moyamoya syndrome CT head right temporal large ICH with SDH, possible pseudo SAH, Duret hemorrhage with cerebral edema CT head and neck possible spot sign, right MCA occlusion versus vasospasm versus moyamoya syndrome Status post hematoma evacuation CT head substantial evacuation of right temporal lobe hematoma, decreased right SDH, basilar cistern predominant SAH and decreased mass effect, residual leftward midline shift 4 to 6 mm, stable volume new IVH without ventriculomegaly, right temporal lobe surgical vascular clips in place MRI brain Right temporal lobe intraparenchymal hemorrhage stable. 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  Nearly occluded right A1 branch.  Occluded proximal M1 branch with reconstitution of M2 branches in the sylvian fissure via collaterals 2D Echo EF > 75% LDL 74 HgbA1c 5.3 UDS negative SCDs for VTE prophylaxis No antithrombotic prior to admission, now on No antithrombotic due to ICH Ongoing aggressive stroke risk factor management Therapy recommendations: Pending Disposition: Pending   Cerebral  edema Brain herniation CT head 8/2 showed intracranial mass effect with widespread cerebral edema, right uncal herniation, trapped left lateral ventricle, effaced basilar cisterns, possible Duret hemorrhage in the right brainstem Status post hematoma evacuation with Dr. Lanis CT head 8/3 substantial evacuation of right temporal lobe hematoma, decreased right SDH, basilar cistern predominant SAH and decreased mass effect, residual leftward midline shift 4 to 6 mm, stable volume new IVH without ventriculomegaly, right temporal lobe surgical vascular clips in place CT 8/4 Similar  parenchymal hemorrhage in the right temporal lobe with surrounding edema and local mass effect, resulting in approximately 5 mm leftward midline shift (previously 6 mm). Status post 23.4% bolus 8/2 Now on 3% saline @ 75 -> 50-> off Na Q6 with goal 150-155 Na 137--141--140--149-155-159   Respiratory failure Intubated on sedation Vent management per CCM   ? Mycotic aneurysm Interstitial lung disease on CellCept Fever  S/p Right temporal craniotomy for evacuation of hematoma with clipping of distal RMCA aneurysm  Concerning for mycotic aneurysm, especially that pt is on cellcept with ILD Blood culture sent and pending Tmax 101 Tachycardia Leukocytosis WBC 19.4--16.5  Hypertension Mildly hypertensive, or Norvasc  10 and losartan  100 On Cleviprex , taper off as able  On amlodipine  10, hydralazine  50 every 6 hours, losartan  100 daily BP goal less than 160 Long-term BP goal normotensive   Hyperlipidemia Home meds: None LDL 74, goal < 70 Consider low dose statin at discharge   Dysphagia NPO S/p cortrak On tube feeding at 50cc  Other Stroke Risk Factors Obesity, Body mass index is 37.97 kg/m.    Other Active Problems Asthma    Hospital day # 2  Alan Maiden, MD PGY-1  ATTENDING NOTE: I reviewed above note and agree with the assessment and plan. Pt was seen and examined.   Wife at bedside.  Patient less responsive comparing with yesterday, today her eyes closed, not open to voice, no doll's eyes or corneal, gag and cough present.  No moving extremities on pain stimulation.  Had low-grade fever and leukocytosis.  Blood culture pending.  MRI and CT repeat shows stable hematoma and midline shift.  BP still on high end, currently on amlodipine , hydralazine  and losartan , Taper off Cleviprex  as able.  Sodium 159, off 3% saline, on tube feeding.  Patient worsening mental status could metabolic related from fever, leukocytosis.  Continue ICU monitoring  For detailed assessment and  plan, please refer to above as I have made changes wherever appropriate.   Ary Cummins, MD PhD Stroke Neurology 02/17/2024 8:07 PM  This patient is critically ill due to large ICH and SDH status post hematoma evacuation, respiratory failure, cerebral edema and at significant risk of neurological worsening, death form brain herniation, brain death. This patient's care requires constant monitoring of vital signs, hemodynamics, respiratory and cardiac monitoring, review of multiple databases, neurological assessment, discussion with family, other specialists and medical decision making of high complexity. I spent 35 minutes of neurocritical care time in the care of this patient. I had long discussion with her wife at bedside, updated pt current condition, treatment plan and potential prognosis, and answered all the questions. She expressed understanding and appreciation. I also discussed with Dr. Lanis NSG.    To contact Stroke Continuity provider, please refer to [email or web address]. After hours, contact General Neurology

## 2024-02-17 NOTE — TOC CAGE-AID Note (Signed)
 Transition of Care [facility]) - CAGE-AID Screening   Patient Details  Name: Katie Knight MRN: [id number] Date of Birth: 05-Apr-1976  [facility]) CM/SW Contact:    Mihail Prettyman E Elnor Renovato, LCSW Phone Number: 02/17/2024, 9:01 AM   Clinical Narrative:    CAGE-AID Screening: Substance Abuse Screening unable to be completed due to: : Patient unable to participate

## 2024-02-17 NOTE — Progress Notes (Addendum)
 NAME:  Katie Knight, MRN:  [id number], DOB:  [birth year], LOS: 2 ADMISSION DATE:  02/15/2024, CONSULTATION DATE:  02/15/2024 REFERRING MD:  Dr. 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  Stroke code stroke CTA showed large intraparenchymal hemorrhage on the right side with some subdural subarachnoid hemorrhage as well.  Neurosurgery and neurology consulted for further management.  PCCM consulted for admission.  On ED arrival patient was seen mildly hypothermic, bradycardic, and hypertensive.  Lab work significant for potassium 3.3, glucose 216, hemoglobin 11 point.   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 Taken for crani and evacuation. Clipping of distal RMCA aneurysm   Interim History / Subjective:  Agitated overnight BP remains at upper limit of goal 2L positive Tmax 100.1 Tolerating SBT  Objective    Blood pressure (!) 127/58, pulse (!) 107, temperature 100.1 F (37.8 C), temperature source Axillary, resp. rate 19, height 5' 7.01 (1.702 m), weight 110 kg, SpO2 100%.    Vent Mode: PSV;CPAP FiO2 (%):  [40 %] 40 % Set Rate:  [20 bmp] 20 bmp Vt Set:  [500 mL] 500 mL PEEP:   [5 cmH20-8 cmH20] 5 cmH20 Pressure Support:  [5 cmH20-12 cmH20] 5 cmH20 Plateau Pressure:  [16 cmH20-26 cmH20] 22 cmH20   Intake/Output Summary (Last 24 hours) at 02/17/2024 0949 Last data filed at 02/17/2024 0900 Gross per 24 hour  Intake 2829.27 ml  Output 925 ml  Net 1904.27 ml   Filed Weights   02/15/24 0647 02/17/24 0722  Weight: 110 kg 110 kg    Examination:  Gen:     Middle aged female in NAD HEENT:  ETT, PERRL, no JVD Lungs:    Clear bilateral breath sounds CV:         RRR, no MRG Abd:      Soft, NT, ND Ext:    No acute deformity Neuro:   on fentanyl  propofol . Exam largely deferred until sedation can be weaned.  + gag/cough. Moves RUE to pain.     Resolved problem list   Assessment and Plan  Acute intraparenchymal hemorrhage  S/p craniotomy and hematoma evacuation 8/2 with RMCA aneurysm clipping P: Management per neurology/neurosurgery Seizure precautions  SBP goal per neurology, on cleviprex  for now. 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 SAT/SBT as tolerated, mentation preclude extubation  VAP bundle in place  PAD protocol with prop and fentanyl  for RASS goal 0 to -1.  CTX stopped Hold home CellCept  Hyperglycemia - Unknown history of diabetes with last hemoglobin A1c October 2024 was 5.7 P: moderate  scale SSI CBC checks every 4 CBG goal 140-180  Hypertension Hyperlipidemia - Medication reconciliation not yet complete.  Appears patient is on no medications at baseline for hypertension P:  Amlodipine  Losartan  Add hydralazine  50mg  every 6  Keep SBP <   Best Practice (right click and Reselect all SmartList Selections daily)   Diet/type: NPO. Plan for  cotrak and initiation of tube feeds 8/4.  DVT prophylaxis SCD Pressure ulcer(s): N/A GI prophylaxis: PPI Lines: N/A Foley:  Yes, and it is still needed Code Status:  full code Last date of multidisciplinary goals of care discussion: wife and mother updated at bedside. Discussed overview of course of prolonged critical illness including feeding tube, probable tracheostomy, LTACH in best case scenario.   Labs   CBC: Recent Labs  Lab 02/15/24 0659 02/15/24 0751 02/15/24 1050 02/15/24 1128 02/15/24 1508 02/16/24 0613 02/17/24 0626  WBC 8.0  --   --   --   --  19.4* 16.5*  NEUTROABS 6.4  --   --   --   --   --   --   HGB 11.1*   < > 9.2* 9.9* 10.5* 9.7* 8.6*  HCT 35.5*   < > 27.0* 29.0* 31.0* 31.1* 29.3*  MCV 94.7  --   --   --   --  94.0 99.3  PLT 412*  --   --   --   --  359 337   < > = values in this interval not displayed.    Basic Metabolic Panel: Recent Labs  Lab 02/15/24 0653 02/15/24 0659 02/15/24 0751 02/15/24 1050 02/15/24 1128 02/15/24 1336 02/15/24 1508 02/15/24 1620 02/16/24 0613 02/16/24 1142 02/16/24 1840 02/17/24 0024 02/17/24 0626  NA 136 135   < > 139 139   < > 141   < > 148* 149* 155* 154* 155*  K 3.1* 3.3*   < > 3.4* 3.9  --  3.9  --  3.7  --   --   --  3.6  CL 104 101  --  100  --   --   --   --  118*  --   --   --  126*  CO2  --  22  --   --   --   --   --   --  22  --   --   --  23  GLUCOSE 216* 216*  --  136*  --   --   --   --  133*  --   --   --  117*  BUN 8 9  --  8  --   --   --   --  8  --   --   --  13  CREATININE 0.70 0.85  --  0.70  --   --   --   --  0.78  --   --   --  0.92  CALCIUM  --  8.7*  --   --   --   --   --   --  8.1*  --   --   --  7.9*  MG  --   --   --   --   --   --   --   --  2.0  --   --   --   --   PHOS  --   --   --   --   --   --   --   --  3.0  --   --   --   --    < > = values in this interval not displayed.   GFR: Estimated Creatinine Clearance: 97.7 mL/min (by C-G formula based on SCr of 0.92 mg/dL). Recent  Labs  Lab 02/15/24 0659 02/15/24 1336 02/16/24 0613 02/17/24 0626  PROCALCITON  --  <0.10  --   --   WBC 8.0  --  19.4* 16.5*    Liver Function Tests: Recent Labs  Lab 02/15/24 0659  AST 20  ALT 12  ALKPHOS 67  BILITOT 0.5  PROT 7.4  ALBUMIN 3.6   No results for input(s): LIPASE, AMYLASE in the last 168 hours. No results for input(s): AMMONIA in the last 168 hours.  ABG    Component Value Date/Time   PHART 7.387 02/15/2024 1508   PCO2ART 42.1 02/15/2024 1508   PO2ART 259 (H) 02/15/2024 1508   HCO3 25.4 02/15/2024 1508   TCO2 27 02/15/2024 1508   ACIDBASEDEF 1.0 02/15/2024 1128   O2SAT 100 02/15/2024 1508     Coagulation Profile: Recent Labs  Lab 02/15/24 1316  INR 1.0    Cardiac Enzymes: No results for input(s): CKTOTAL, CKMB, CKMBINDEX, TROPONINI in the last 168 hours.  HbA1C: Hgb A1c MFr Bld  Date/Time Value Ref Range Status  05/07/2023 08:45 AM 5.7 (H) 4.8 - 5.6 % Final    Comment:             Prediabetes: 5.7 - 6.4          Diabetes: >6.4          Glycemic control for adults with diabetes: <7.0     CBG: Recent Labs  Lab 02/16/24 1943 02/16/24 2334 02/17/24 0328 02/17/24 0738 02/17/24 0805  GLUCAP 81 91 102* 63* 116*    Critical care time 35 min  Deward Eastern, AGACNP-BC [facility]  See Amion for personal pager PCCM on call pager [phone] until 7pm. Please call Elink 7p-7a. [phone]  02/17/2024 10:28 AM   Xxxxxxxxxxxx STAFF MD S: Remain on vent ? Slightly less responsove during WUA. MRI brain things appera stabl [name] - female   O Obese Wife by side Moter at side On vent CTA bilaterally Cortrak being placed   A IPH Resp failure due to above Immune suppresed  Plan  - PRVC - ICU care as outlined by APP   MD CCM time 15 min    SIGNATURE    Dr. Dorethia Cave, M.D., F.C.C.P,  Pulmonary and Critical Care Medicine Staff Physician, [facility] Director - Interstitial Lung Disease  Program  Pulmonary Fibrosis [facility] at [facility] [place], [place], [address]   Pager: [phone], If no answer  -> Check AMION or Try [phone] Telephone (clinical office): [phone] Telephone (research): [phone]  11:44 AM 02/17/2024

## 2024-02-17 NOTE — Progress Notes (Signed)
 Transported patient to MRI while patient was on the ventilator. Patient remained stable during transport.

## 2024-02-17 NOTE — Progress Notes (Signed)
 Received referral for pt to participate in PR. Pt is not appropriate to participate in PR at this time. We will close the referral.

## 2024-02-17 NOTE — Progress Notes (Signed)
  NEUROSURGERY PROGRESS NOTE   No issues overnight.   EXAM:  BP (!) 158/64   Pulse (!) 122   Temp 100.1 F (37.8 C) (Axillary)   Resp (!) 25   Ht 5' 7.01 (1.702 m)   Wt 110 kg   SpO2 99%   BMI 37.97 kg/m   Intubated, on low-dose propofol : No eye opening Pupils 2-31mm, sluggish Minimal movement BUE/BLE Wound c/d/i  IMAGING: MRI reviewed, no new hemorrhage or worsening mass effect. No HCP.  IMPRESSION:  47 y.o. female POD#2 s/p right temporal crani for evacuation of hematoma, clipping of distal MCA aneurysm. Concern for infectious (mycotic) etiology  PLAN: - Cont supportive care per PCCM/neurology   Gerldine Maizes, MD [facility]

## 2024-02-17 NOTE — Procedures (Signed)
 Cortrak  Person Inserting Tube:  Aprille Sawhney T, RD Tube Type:  Cortrak - 43 inches Tube Size:  10 Tube Location:  Right nare Secured by: Bridle Technique Used to Measure Tube Placement:  Marking at nare/corner of mouth Cortrak Secured At:  70 cm   Cortrak Tube Team Note:  Consult received to place a Cortrak feeding tube.   X-ray has been ordered by the Cortrak team. Please confirm placement prior to use.  If the tube becomes dislodged please keep the tube and contact the Cortrak team at [email or web address] for replacement.  If after hours and replacement cannot be delayed, place a NG tube and confirm placement with an abdominal x-ray.    Trude Ned RD, LDN Contact via [employer].

## 2024-02-17 NOTE — Progress Notes (Signed)
 Initial Nutrition Assessment  DOCUMENTATION CODES:   Obesity unspecified  INTERVENTION:   Initiate tube feeding via cortrak tube: Osmolite 1.5 at 50 ml/h (1200 ml per day)  Prosource TF20 60 ml BID  Provides 1960 kcal, 115 gm protein, 912 ml free water  daily  MVI with minerals daily    NUTRITION DIAGNOSIS:   Inadequate oral intake related to inability to eat as evidenced by NPO status.  GOAL:   Patient will meet greater than or equal to 90% of their needs  MONITOR:   TF tolerance, Diet advancement  REASON FOR ASSESSMENT:   Consult Enteral/tube feeding initiation and management  ASSESSMENT:   Pt with PMH of Interstitial Lung Disease with Usual Interstitial Pneumonia treated with CellCept as of 10/2023, HTN, HLD, osteoporosis, vitamin D  deficiency and asthma admitted with large IPH.   Pt discussed during ICU rounds and with RN and MD. Pt admitted with large ICH with SDH, etiology unclear.  Pt intubated on 40% fiO2. Spoke with wife and mom who are at bedside. Per wife pt eats 3 meals per day.  Breakfast: cereal, eggs, etc Lunch: oodles of noodles Dinner: mix of cooked at home, take out, etc  Pt is no longer working.   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   8/2 - s/p R temporal craniotomy, clipping of distal R MCA aneurysm  8/4 - s/p cortrak placement; tip in duodenum per xray however suspect this is inaccurate since pt has hx of heterotaxy   Medications reviewed and include: pepcid , SSI every 4 hours, miralax , senokot-s  Cleviprex  @ 60 ml/hr provides: 2880 kcal  Propofol  @ 13.2 ml/hr provides: 348 kcal  Hypertonic saline  Labs reviewed:  Na 159 Phos 3.0 -> 2.6 Mag 2.0 -> 2.0 A1C 5.3 CBG's: 77-116 -  NPO  NUTRITION - FOCUSED PHYSICAL EXAM:  Flowsheet Row Most Recent Value  Orbital Region No depletion  Upper Arm Region No depletion  Thoracic and Lumbar Region No depletion  Buccal Region No depletion  Temple Region No depletion   Clavicle Bone Region No depletion  Clavicle and Acromion Bone Region No depletion  Scapular Bone Region No depletion  Dorsal Hand No depletion  Patellar Region No depletion  Anterior Thigh Region No depletion  Posterior Calf Region No depletion  Edema (RD Assessment) None  Hair Reviewed  Eyes Unable to assess  Mouth Unable to assess  Skin Reviewed  Nails Reviewed    Diet Order:   Diet Order             Diet NPO time specified  Diet effective now                   EDUCATION NEEDS:   Not appropriate for education at this time  Skin:  Skin Assessment: Reviewed RN Assessment (R head incision)  Last BM:  unknown  Height:   Ht Readings from Last 1 Encounters:  02/15/24 5' 7.01 (1.702 m)    Weight:   Wt Readings from Last 1 Encounters:  02/17/24 110 kg    BMI:  Body mass index is 37.97 kg/m.  Estimated Nutritional Needs:   Kcal:  1900-2100  Protein:  105-120 grams  Fluid:  >1.9L/day  Imogean Ciampa P., RD, LDN, CNSC See AMiON for contact information

## 2024-02-17 NOTE — Progress Notes (Signed)
*  PRELIMINARY RESULTS* Echocardiogram 2D Echocardiogram has been performed.  Benard FORBES Stallion 02/17/2024, 4:05 PM

## 2024-02-17 NOTE — Progress Notes (Signed)
Pt transported to and from CT without event.  

## 2024-02-17 NOTE — Progress Notes (Signed)
 Na 159 > hold 3% and continue to trend Temp 101 > tylenol 

## 2024-02-17 NOTE — Anesthesia Postprocedure Evaluation (Signed)
 Anesthesia Post Note  Patient: Katie Knight  Procedure(s) Performed: CRANIOTOMY HEMATOMA EVACUATION SUBDURAL (Right: Head)     Patient location during evaluation: ICU Anesthesia Type: General Level of consciousness: patient remains intubated per anesthesia plan and sedated Pain management: pain level controlled Vital Signs Assessment: post-procedure vital signs reviewed and stable Respiratory status: patient remains intubated per anesthesia plan and patient on ventilator - see flowsheet for VS Anesthetic complications: no Comments: Pt remains intubated and sedated     Last Vitals:  Vitals:   02/17/24 1115 02/17/24 1130  BP: 139/64 (!) 144/65  Pulse: (!) 110 (!) 119  Resp: 19 19  Temp:    SpO2: 100% 100%    Last Pain:  Vitals:   02/17/24 0400  TempSrc: Axillary                 Laylana Gerwig,E. Dazha Kempa

## 2024-02-17 NOTE — Progress Notes (Signed)
 PCCM INTERVAL PROGRESS NOTE   Not arousing much despite sedation being off. Repeat CT head after discussion with Dr. Jerri.  New fevers. Ongoing leukocytosis. Thick tan secretions. Restart Ceftriaxone . Send sputum cx        Deward Eastern, AGACNP-BC [facility]  See Amion for personal pager PCCM on call pager [phone] until 7pm. Please call Elink 7p-7a. [phone]  02/17/2024 2:45 PM

## 2024-02-17 NOTE — TOC CM/SW Note (Signed)
 Transition of Care [facility]) - Inpatient Brief Assessment   Patient Details  Name: Katie Knight MRN: [id number] Date of Birth: 02-Jul-1976  Transition of Care [facility]) CM/SW Contact:    Inocente GORMAN Kindle, LCSW Phone Number: 02/17/2024, 9:52 AM   Clinical Narrative: Patient admitted from home and is currently intubated. Please place ICM consult if needed.    Transition of Care Asessment: Insurance and Status: Insurance coverage has been reviewed Patient has primary care physician: Yes Home environment has been reviewed: From home Prior level of function:: Independent Prior/Current Home Services: No current home services Social Drivers of Health Review: SDOH reviewed no interventions necessary Readmission risk has been reviewed: Yes Transition of care needs: no transition of care needs at this time

## 2024-02-18 ENCOUNTER — Inpatient Hospital Stay (HOSPITAL_COMMUNITY)

## 2024-02-18 ENCOUNTER — Other Ambulatory Visit: Payer: Self-pay

## 2024-02-18 DIAGNOSIS — I619 Nontraumatic intracerebral hemorrhage, unspecified: Secondary | ICD-10-CM | POA: Diagnosis not present

## 2024-02-18 DIAGNOSIS — I675 Moyamoya disease: Secondary | ICD-10-CM

## 2024-02-18 DIAGNOSIS — J9602 Acute respiratory failure with hypercapnia: Secondary | ICD-10-CM | POA: Diagnosis not present

## 2024-02-18 DIAGNOSIS — J9601 Acute respiratory failure with hypoxia: Secondary | ICD-10-CM | POA: Diagnosis not present

## 2024-02-18 DIAGNOSIS — G936 Cerebral edema: Secondary | ICD-10-CM | POA: Diagnosis not present

## 2024-02-18 DIAGNOSIS — I6200 Nontraumatic subdural hemorrhage, unspecified: Secondary | ICD-10-CM | POA: Diagnosis not present

## 2024-02-18 DIAGNOSIS — I729 Aneurysm of unspecified site: Secondary | ICD-10-CM

## 2024-02-18 LAB — BASIC METABOLIC PANEL WITH GFR
Anion gap: 8 (ref 5–15)
BUN: 22 mg/dL — ABNORMAL HIGH (ref 6–20)
CO2: 25 mmol/L (ref 22–32)
Calcium: 8.4 mg/dL — ABNORMAL LOW (ref 8.9–10.3)
Chloride: 125 mmol/L — ABNORMAL HIGH (ref 98–111)
Creatinine, Ser: 0.97 mg/dL (ref 0.44–1.00)
GFR, Estimated: >60 mL/min (ref >60)
Glucose, Bld: 150 mg/dL — ABNORMAL HIGH (ref 70–99)
Potassium: 3.4 mmol/L — ABNORMAL LOW (ref 3.5–5.1)
Sodium: 158 mmol/L — ABNORMAL HIGH (ref 135–145)

## 2024-02-18 LAB — GLUCOSE, CAPILLARY
Glucose-Capillary: 109 mg/dL — ABNORMAL HIGH (ref 70–99)
Glucose-Capillary: 113 mg/dL — ABNORMAL HIGH (ref 70–99)
Glucose-Capillary: 115 mg/dL — ABNORMAL HIGH (ref 70–99)
Glucose-Capillary: 122 mg/dL — ABNORMAL HIGH (ref 70–99)
Glucose-Capillary: 123 mg/dL — ABNORMAL HIGH (ref 70–99)
Glucose-Capillary: 128 mg/dL — ABNORMAL HIGH (ref 70–99)

## 2024-02-18 LAB — SODIUM: Sodium: 160 mmol/L — ABNORMAL HIGH (ref 135–145)

## 2024-02-18 LAB — MAGNESIUM: Magnesium: 2.2 mg/dL (ref 1.7–2.4)

## 2024-02-18 LAB — PHOSPHORUS: Phosphorus: 1.8 mg/dL — ABNORMAL LOW (ref 2.5–4.6)

## 2024-02-18 MED ORDER — CARVEDILOL 12.5 MG PO TABS
12.5000 mg | ORAL_TABLET | Freq: Two times a day (BID) | ORAL | Status: DC
  Administered 2024-02-18 – 2024-02-27 (×22): 12.5 mg
  Filled 2024-02-18 (×18): qty 1

## 2024-02-18 MED ORDER — POTASSIUM CHLORIDE 20 MEQ PO PACK
20.0000 meq | PACK | Freq: Once | ORAL | Status: AC
  Administered 2024-02-18: 20 meq
  Filled 2024-02-18: qty 1

## 2024-02-18 MED ORDER — SODIUM CHLORIDE 0.9% FLUSH
10.0000 mL | Freq: Two times a day (BID) | INTRAVENOUS | Status: DC
  Administered 2024-02-18 – 2024-02-20 (×5): 10 mL
  Administered 2024-02-21: 30 mL
  Administered 2024-02-21 – 2024-02-27 (×13): 10 mL

## 2024-02-18 MED ORDER — LABETALOL HCL 5 MG/ML IV SOLN: 10.0000 mg | INTRAVENOUS | Status: DC | PRN

## 2024-02-18 MED ORDER — POTASSIUM CHLORIDE 20 MEQ PO PACK: 60.0000 meq | PACK | Freq: Once | ORAL | Status: DC

## 2024-02-18 MED ORDER — SODIUM CHLORIDE 0.9 % IV SOLN
30.0000 mmol | Freq: Once | INTRAVENOUS | Status: AC
  Administered 2024-02-18: 30 mmol via INTRAVENOUS
  Filled 2024-02-18: qty 10

## 2024-02-18 MED ORDER — SODIUM CHLORIDE 0.9% FLUSH: 10.0000 mL | INTRAVENOUS | Status: DC | PRN

## 2024-02-18 MED ORDER — LABETALOL HCL 5 MG/ML IV SOLN
10.0000 mg | INTRAVENOUS | Status: DC | PRN
  Administered 2024-02-18: 10 mg via INTRAVENOUS
  Filled 2024-02-18: qty 4

## 2024-02-18 NOTE — Progress Notes (Addendum)
 STROKE TEAM PROGRESS NOTE    SUBJECTIVE (INTERIM HISTORY) Patient is no longer agitated and is demonstrating slight improvement in responsiveness compared to yesterday.  She remains hypertensive despite current regimen.  Sedation has been discontinued, and we will continue to monitor for further improvement in mental status.  Plans include initiating additional antihypertensive to better control blood pressure.  No new signs of distress noted.    OBJECTIVE Vitals:   02/18/24 0839 02/18/24 0900 02/18/24 1000 02/18/24 1045  BP:  (!) 156/63 (!) 169/66 (!) 160/71  Pulse: (!) 125 (!) 128 (!) 126 (!) 122  Temp:      Resp: (!) 23 (!) 22 (!) 24 (!) 23  Height:      Weight:      SpO2: 100% 100% 100% 99%  TempSrc:      BMI (Calculated):         CBC    Component Value Date/Time   WBC 16.5 (H) 02/17/2024 0626   RBC 2.95 (L) 02/17/2024 0626   HGB 8.6 (L) 02/17/2024 0626   HCT 29.3 (L) 02/17/2024 0626   PLT 337 02/17/2024 0626   MCV 99.3 02/17/2024 0626   MCH 29.2 02/17/2024 0626   MCHC 29.4 (L) 02/17/2024 0626   RDW 13.9 02/17/2024 0626   LYMPHSABS 1.0 02/15/2024 0659   MONOABS 0.6 02/15/2024 0659   EOSABS 0.0 02/15/2024 0659   BASOSABS 0.0 02/15/2024 0659    BMET    Component Value Date/Time   NA 156 (H) 02/17/2024 1630   NA 137 05/07/2023 0845   K 3.6 02/17/2024 0626   CL 126 (H) 02/17/2024 0626   CO2 23 02/17/2024 0626   GLUCOSE 117 (H) 02/17/2024 0626   BUN 13 02/17/2024 0626   BUN 15 05/07/2023 0845   CREATININE 0.92 02/17/2024 0626   CREATININE 0.74 08/19/2023 1015   CALCIUM 7.9 (L) 02/17/2024 0626   EGFR 102 08/19/2023 1015   EGFR 91 05/07/2023 0845   GFRNONAA >60 02/17/2024 0626    IMAGING past 24 hours CT HEAD WO CONTRAST ( ) Result Date: 02/17/2024 EXAM: CT HEAD WITHOUT 02/17/2024 05:06:13 PM TECHNIQUE: CT of the head was performed without the administration of intravenous contrast. Automated exposure control, iterative reconstruction, and/or weight based  adjustment of the mA/kV was utilized to reduce the radiation dose to as low as reasonably achievable. COMPARISON: None available. CLINICAL HISTORY: Stroke, follow up. FINDINGS: BRAIN AND VENTRICLES: Similar post craniotomy changes with similar appearance of parenchymal hemorrhage primarily within the right temporal lobe. Similar surrounding edema with local mass effect. Approximately 5 mm leftward midline shift, previously 6 mm. There are areas of layering intraventricular blood products within the occipital horns which appears similar to prior. Few areas of subarachnoid hemorrhage over the left frontoparietal lobes are slightly increased. Slight interval decrease in pneumocephalus. Focus of subdural hemorrhage over the right frontal convexity measuring up to 3 mm in thickness which is stable to slightly decreased. There is additional subdural hemorrhage along the right tentorial leaflet measuring up to 3 mm which is similar. Similar surgical aneurysm clip. ORBITS: No acute abnormality. SINUSES AND MASTOIDS: Mucosal thickening in the paranasal sinuses with air-fluid level in the left maxillary sinus which is new from prior. SOFT TISSUES AND SKULL: Similar postoperative cortices in the right frontotemporal scalp. Soft tissue swelling in the facial soft tissues over the zygomatic arch appears increased. Partially visualized nasoenteric tube. Fluid within the nasopharynx and posterior aspect of the nasal cavity. IMPRESSION: 1. Similar parenchymal hemorrhage in the right temporal  lobe with surrounding edema and local mass effect, resulting in approximately 5 mm leftward midline shift (previously 6 mm). 2. Slightly increased subarachnoid hemorrhage over the left frontoparietal lobes. 3. Layering intraventricular blood products within the occipital horns, similar to prior. 4. Stable to slightly decreased subdural hemorrhage over the right frontal convexity and similar subdural hemorrhage along the right tentorial leaflet,  both measuring up to 3 mm in thickness. 5. New mucosal thickening in the paranasal sinuses with air-fluid level in the left maxillary sinus. Electronically signed by: Donnice Mania MD 02/17/2024 05:37 PM EDT RP Workstation: [id number]   ECHOCARDIOGRAM COMPLETE Result Date: 02/17/2024    ECHOCARDIOGRAM REPORT   Patient Name:   Katie Knight Date of Exam: 02/17/2024 Medical Rec #:  [id number]        Height:       67.0 in Accession #:    [id number]       Weight:       242.5 lb Date of Birth:  [birth year]        BSA:          2.195 m Patient Age:    48 years         BP:           128/57 mmHg Patient Gender: F                HR:           148 bpm. Exam Location:  Inpatient Procedure: 2D Echo, Color Doppler and Cardiac Doppler (Both Spectral and Color            Flow Doppler were utilized during procedure). Indications:    Stroke  History:        Patient has prior history of Echocardiogram examinations, most                 recent 04/22/2018. Hemorrage.  Sonographer:    Benard Stallion Referring Phys: ARY CUMMINS IMPRESSIONS  1. Hyperdynamic and tachycardic LV, mild intercavitary gradient of . Left ventricular ejection fraction, by estimation, is >75%. The left ventricle has hyperdynamic function. The left ventricle has no regional wall motion abnormalities. Indeterminate diastolic filling due to E-A fusion.  2. Right ventricular systolic function is normal. The right ventricular size is normal.  3. The mitral valve is normal in structure. No evidence of mitral valve regurgitation. No evidence of mitral stenosis.  4. The aortic valve is tricuspid. Aortic valve regurgitation is not visualized. No aortic stenosis is present.  5. The inferior vena cava is normal in size with greater than 50% respiratory variability, suggesting right atrial pressure of 3 mmHg. FINDINGS  Left Ventricle: Hyperdynamic and tachycardic LV, mild intercavitary gradient of . Left ventricular ejection fraction, by estimation, is >75%. The  left ventricle has hyperdynamic function. The left ventricle has no regional wall motion abnormalities. The left ventricular internal cavity size was normal in size. There is no left ventricular hypertrophy. Indeterminate diastolic filling due to E-A fusion. Right Ventricle: The right ventricular size is normal. No increase in right ventricular wall thickness. Right ventricular systolic function is normal. Left Atrium: Left atrial size was normal in size. Right Atrium: Right atrial size was normal in size. Pericardium: There is no evidence of pericardial effusion. Mitral Valve: The mitral valve is normal in structure. No evidence of mitral valve regurgitation. No evidence of mitral valve stenosis. Tricuspid Valve: The tricuspid valve is normal in structure. Tricuspid valve regurgitation is mild . No evidence of tricuspid stenosis. Aortic  Valve: Elevated aortic valve gradients but given significantly elevated LVOT VTI, normal aortic valve appearance and excursion, believe that this is likely secondary to high flow and tachycardia. The aortic valve is tricuspid. Aortic valve regurgitation is not visualized. No aortic stenosis is present. Aortic valve mean gradient measures 22.3 mmHg. Aortic valve peak gradient measures 41.6 mmHg. Aortic valve area, by VTI measures 2.43 cm. Pulmonic Valve: The pulmonic valve was normal in structure. Pulmonic valve regurgitation is not visualized. No evidence of pulmonic stenosis. Aorta: The aortic root is normal in size and structure. Venous: The inferior vena cava is normal in size with greater than 50% respiratory variability, suggesting right atrial pressure of 3 mmHg. IAS/Shunts: No atrial level shunt detected by color flow Doppler.  LEFT VENTRICLE PLAX 2D LVIDd:         4.40 cm   Diastology LVIDs:         2.60 cm   LV e' medial:    15.00 cm/s LV PW:         1.00 cm   LV E/e' medial:  8.0 LV IVS:        1.00 cm   LV e' lateral:   13.90 cm/s LVOT diam:     2.10 cm   LV E/e'  lateral: 8.6 LV SV:         91 LV SV Index:   42 LVOT Area:     3.46 cm  RIGHT VENTRICLE RV Basal diam:  3.50 cm RV Mid diam:    2.80 cm RV S prime:     31.40 cm/s TAPSE (M-mode): 3.3 cm LEFT ATRIUM             Index        RIGHT ATRIUM           Index LA diam:        3.10 cm 1.41 cm/m   RA Area:     14.20 cm LA Vol (A2C):   46.8 ml 21.32 ml/m  RA Volume:   33.60 ml  15.31 ml/m LA Vol (A4C):   52.3 ml 23.83 ml/m LA Biplane Vol: 52.5 ml 23.92 ml/m  AORTIC VALVE AV Area (Vmax):    2.32 cm AV Area (Vmean):   2.39 cm AV Area (VTI):     2.43 cm AV Vmax:           322.67 cm/s AV Vmean:          214.667 cm/s AV VTI:            0.374 m AV Peak Grad:      41.6 mmHg AV Mean Grad:      22.3 mmHg LVOT Vmax:         216.00 cm/s LVOT Vmean:        148.000 cm/s LVOT VTI:          0.263 m LVOT/AV VTI ratio: 0.70  AORTA Ao Root diam: 2.80 cm Ao Asc diam:  3.40 cm MITRAL VALVE                TRICUSPID VALVE MV Area (PHT): 6.54 cm     TR Peak grad:   22.7 mmHg MV Decel Time: 116 msec     TR Vmax:        238.00 cm/s MV E velocity: 120.00 cm/s MV A velocity: 146.00 cm/s  SHUNTS MV E/A ratio:  0.82         Systemic VTI:  0.26 m  Systemic Diam: 2.10 cm Katie Knight Electronically signed by Katie Knight Signature Date/Time: 02/17/2024/4:58:19 PM    Final    DG Abd Portable 1V Result Date: 02/17/2024 CLINICAL DATA:  Feeding tube placement EXAM: PORTABLE ABDOMEN - 1 VIEW COMPARISON:  02/15/2024 FINDINGS: Feeding tube is transpyloric with the tip of the feeding tube in the duodenum at the junction of the 3rd and 4th portions. Nonobstructive bowel gas pattern. IMPRESSION: Feeding tube tip at the junction of the 3rd and 4th portions of the duodenum. Electronically Signed   By: Franky Crease M.D.   On: 02/17/2024 12:41    Vitals:   02/18/24 0839 02/18/24 0900 02/18/24 1000 02/18/24 1045  BP:  (!) 156/63 (!) 169/66 (!) 160/71  Pulse: (!) 125 (!) 128 (!) 126 (!) 122  Resp: (!) 23 (!) 22 (!) 24 (!)  23  Temp:      TempSrc:      SpO2: 100% 100% 100% 99%  Weight:      Height:         PHYSICAL EXAM General:  Well-nourished, well-developed, intubated no sedation CV: Regular rhythm with tachycardia Respiratory:  Regular, unlabored respirations on room air   NEURO: Intubated off sedation, rest with eyes closed and is unable to respond to name or follow commands.  No movement of the right or left upper extremity despite sedation being off.  Pupils are 2mm each, reactive, and midline, corneal reflex sluggish bilaterally. Present doll's eyes. Sensation, coordination, and gait not tested.    ASSESSMENT/PLAN Ms. Ramina Hulet is a 48 y.o. female with history of interstitial lung disease on CellCept, hypertension, hyperlipidemia, asthma admitted for headache, nausea vomiting and altered mental status. No TNK given due to [facility]. The patient afebrile and more responsive today compared to yesterday, with positive doll's eye reflex and intact corneal reflex.  BP remains consistently elevated; we will initiate as needed labetalol  IM, in addition to her amlodipine , hydralazine , losartan .  Given elevated leukocytosis, we will obtain a chest x-ray to evaluate for possible aspiration. Na is down-trending from 159 to 156 off 3%, on tube feeding.     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 right MCA moyamoya syndrome CT head right temporal large ICH with SDH, possible pseudo SAH, Duret hemorrhage with cerebral edema CT head and neck possible spot sign, right MCA occlusion versus vasospasm versus moyamoya syndrome Status post hematoma evacuation CT head substantial evacuation of right temporal lobe hematoma, decreased right SDH, basilar cistern predominant SAH and decreased mass effect, residual leftward midline shift 4 to 6 mm, stable volume new IVH without ventriculomegaly, right temporal lobe surgical vascular clips in place MRI brain Right temporal  lobe intraparenchymal hemorrhage stable. 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  Nearly occluded right A1 branch.  Occluded proximal M1 branch with reconstitution of M2 branches in the sylvian fissure via collaterals 2D Echo EF > 75% LDL 74 HgbA1c 5.3 UDS negative SCDs for VTE prophylaxis No antithrombotic prior to admission, now on No antithrombotic due to ICH Ongoing aggressive stroke risk factor management Therapy recommendations: Pending Disposition: Pending   Cerebral edema Brain herniation CT head 8/2 showed intracranial mass effect with widespread cerebral edema, right uncal  herniation, trapped left lateral ventricle, effaced basilar cisterns, possible Duret hemorrhage in the right brainstem Status post hematoma evacuation with Dr. Lanis CT head 8/3 substantial evacuation of right temporal lobe hematoma, decreased right SDH, basilar cistern predominant SAH and decreased mass effect, residual leftward midline shift 4 to 6 mm, stable volume new IVH without ventriculomegaly, right temporal lobe surgical vascular clips in place CT 8/4 Similar parenchymal hemorrhage in the right temporal lobe with surrounding edema and local mass effect, resulting in approximately 5 mm leftward midline shift (previously 6 mm). CT 8/6 pending Status post 23.4% bolus 8/2 Now on 3% saline @ 75 -> 50-> off Na Q6 with goal 150-155 Na 137--141--140--149-155-159   Respiratory failure Intubated on sedation Vent management per CCM Still on propofol , off fentanyl    ? 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    Other Active Problems Asthma Tachycardia on coreg      Hospital day # 3   Alan Maiden, MD PGY-1  ATTENDING NOTE: I reviewed above note and agree with the assessment and plan. Pt was seen and examined.   Wife is at the bedside. Pt still intubated on sedation, on vent. Slightly more responsive than yesterday, attempted open eyes on voice, pupils equal, but still not able to open eyes or following commands, eye midline with weak corneal, positive gag, slight withdraw to pain on the right. Repeat CT yesterday stable. Will repeat CT again tomorrow. Tachycardia put on coreg , off cleviprex . On TF. Continue ICU care.   For detailed assessment and plan, please refer to above as I have made changes wherever appropriate.   Ary Cummins, MD PhD Stroke Neurology 02/18/2024 9:38 PM  This patient is critically ill due to large ICH and SDH status post hematoma evacuation, respiratory failure, cerebral edema and at significant risk of neurological worsening, death form brain herniation, brain death. This patient's care requires constant monitoring of vital signs, hemodynamics, respiratory and cardiac monitoring, review of multiple databases, neurological assessment, discussion with family, other specialists and medical decision making of high complexity. I spent 35 minutes of neurocritical care time in the care of this  patient. I had long discussion with her wife at bedside, updated pt current condition, treatment plan and potential prognosis, and answered all the questions. She expressed understanding and appreciation.   To contact Stroke Continuity provider, please refer to [email or web address]. After hours, contact General Neurology

## 2024-02-18 NOTE — Progress Notes (Signed)
 Peripherally Inserted Central Catheter Placement  The IV Nurse has discussed with the patient and/or persons authorized to consent for the patient, the purpose of this procedure and the potential benefits and risks involved with this procedure.  The benefits include less needle sticks, lab draws from the catheter, and the patient may be discharged home with the catheter. Risks include, but not limited to, infection, bleeding, blood clot (thrombus formation), and puncture of an artery; nerve damage and irregular heartbeat and possibility to perform a PICC exchange if needed/ordered by physician.  Alternatives to this procedure were also discussed.  Bard Power PICC patient education guide, fact sheet on infection prevention and patient information card has been provided to patient /or left at bedside.  Consent signed by wife due to altered mental status.    PICC Placement Documentation  PICC Triple Lumen 02/18/24 Right Brachial 39 cm 0 cm (Active)  Indication for Insertion or Continuance of Line Vasoactive infusions 02/18/24 1713  Exposed Catheter (cm) 0 cm 02/18/24 1713  Site Assessment Clean, Dry, Intact 02/18/24 1713  Lumen #1 Status Flushed;Saline locked;Blood return noted 02/18/24 1713  Lumen #2 Status Flushed;Saline locked;Blood return noted 02/18/24 1713  Lumen #3 Status Flushed;Saline locked;Blood return noted 02/18/24 1713  Dressing Type Transparent;Securing device 02/18/24 1713  Dressing Status Antimicrobial disc/dressing in place;Clean, Dry, Intact 02/18/24 1713  Line Care Connections checked and tightened 02/18/24 1713  Line Adjustment (NICU/IV Team Only) No 02/18/24 1713  Dressing Intervention New dressing;Dressing changed;Adhesive placed at insertion site (IV team only) 02/18/24 1713  Dressing Change Due 02/25/24 02/18/24 1713       Miata Culbreth, Cherene Place 02/18/2024, 5:14 PM

## 2024-02-18 NOTE — Progress Notes (Deleted)
 Office Visit Note  Patient: Katie Knight             Date of Birth: 1975-08-03           MRN: [id number]             PCP: Larnell Hamilton, MD Referring: Larnell Hamilton, MD Visit Date: 03/03/2024 Occupation: @GUAROCC @  Subjective:  No chief complaint on file.   History of Present Illness: Katie Knight is a 48 y.o. female ***     Activities of Daily Living:  Patient reports morning stiffness for *** {minute/hour:19697}.   Patient {ACTIONS;DENIES/REPORTS:21021675::Denies} nocturnal pain.  Difficulty dressing/grooming: {ACTIONS;DENIES/REPORTS:21021675::Denies} Difficulty climbing stairs: {ACTIONS;DENIES/REPORTS:21021675::Denies} Difficulty getting out of chair: {ACTIONS;DENIES/REPORTS:21021675::Denies} Difficulty using hands for taps, buttons, cutlery, and/or writing: {ACTIONS;DENIES/REPORTS:21021675::Denies}  No Rheumatology ROS completed.   PMFS History:  Patient Active Problem List   Diagnosis Date Noted   Intraparenchymal hemorrhage of brain (HCC) 02/15/2024   Severe persistent asthma, uncomplicated 11/29/2023   ILD (interstitial lung disease) (HCC) 07/26/2023   Prediabetes 05/21/2023   Other fatigue 05/07/2023   SOBOE (shortness of breath on exertion) 05/07/2023   Vitamin D  deficiency 05/07/2023   Snoring 05/07/2023   Depression screen 05/07/2023   BMI 40.0-44.9, adult (HCC) 05/07/2023   Morbid obesity with starting BMI 43.7 05/07/2023   Elevated blood pressure reading 04/23/2023   Chronic pain of both knees 04/23/2023   Asthma 08/27/2022   Chronic cough 08/27/2022   Abnormal CT of the chest 08/27/2022    Past Medical History:  Diagnosis Date   Arthritis    Asthma    High blood pressure    Hyperlipidemia    Joint pain    Osteoporosis    SOB (shortness of breath)    Vitamin D  deficiency     Family History  Problem Relation Age of Onset   Hypertension Mother    Hypertension Father    Colon cancer Neg Hx    Esophageal cancer Neg Hx     Rectal cancer Neg Hx    Stomach cancer Neg Hx    Past Surgical History:  Procedure Laterality Date   BRONCHIAL BIOPSY  04/30/2022   Procedure: BRONCHIAL BIOPSIES;  Surgeon: Annella Donnice SAUNDERS, MD;  Location: [facility];  Service: Endoscopy;;   BRONCHIAL NEEDLE ASPIRATION BIOPSY  04/30/2022   Procedure: BRONCHIAL NEEDLE ASPIRATION BIOPSIES;  Surgeon: Annella Donnice SAUNDERS, MD;  Location: [facility];  Service: Endoscopy;;   BRONCHIAL WASHINGS  04/30/2022   Procedure: BRONCHIAL WASHINGS;  Surgeon: Annella Donnice SAUNDERS, MD;  Location: [facility];  Service: Endoscopy;;   CRANIOTOMY Right 02/15/2024   Procedure: CRANIOTOMY HEMATOMA EVACUATION SUBDURAL;  Surgeon: Lanis Pupa, MD;  Location: [facility];  Service: Neurosurgery;  Laterality: Right;   DILATION AND CURETTAGE OF UTERUS     ENDOBRONCHIAL ULTRASOUND N/A 04/30/2022   Procedure: ENDOBRONCHIAL ULTRASOUND;  Surgeon: Annella Donnice SAUNDERS, MD;  Location: [facility];  Service: Endoscopy;  Laterality: N/A;   INTERCOSTAL NERVE BLOCK Right 07/26/2023   Procedure: INTERCOSTAL NERVE BLOCK;  Surgeon: Kerrin Elspeth BROCKS, MD;  Location: [facility] OR;  Service: Thoracic;  Laterality: Right;   LUNG BIOPSY Right 07/26/2023   Procedure: LUNG BIOPSY;  Surgeon: Kerrin Elspeth BROCKS, MD;  Location: [facility] OR;  Service: Thoracic;  Laterality: Right;   VIDEO BRONCHOSCOPY  04/30/2022   Procedure: VIDEO BRONCHOSCOPY WITHOUT FLUORO;  Surgeon: Annella Donnice SAUNDERS, MD;  Location: [facility];  Service: Endoscopy;;   Social History   Social History Narrative   Not on file    There is  no immunization history on file for this patient.   Objective: Vital Signs: There were no vitals taken for this visit.   Physical Exam   Musculoskeletal Exam: ***  CDAI Exam: CDAI Score: -- Patient Global: --; Provider Global: -- Swollen: --; Tender: -- Joint Exam 03/03/2024   No joint exam has been documented for this visit   There is currently no information  documented on the homunculus. Go to the Rheumatology activity and complete the homunculus joint exam.  Investigation: No additional findings.  Imaging: CT HEAD WO CONTRAST ( ) Result Date: 02/17/2024 EXAM: CT HEAD WITHOUT 02/17/2024 05:06:13 PM TECHNIQUE: CT of the head was performed without the administration of intravenous contrast. Automated exposure control, iterative reconstruction, and/or weight based adjustment of the mA/kV was utilized to reduce the radiation dose to as low as reasonably achievable. COMPARISON: None available. CLINICAL HISTORY: Stroke, follow up. FINDINGS: BRAIN AND VENTRICLES: Similar post craniotomy changes with similar appearance of parenchymal hemorrhage primarily within the right temporal lobe. Similar surrounding edema with local mass effect. Approximately 5 mm leftward midline shift, previously 6 mm. There are areas of layering intraventricular blood products within the occipital horns which appears similar to prior. Few areas of subarachnoid hemorrhage over the left frontoparietal lobes are slightly increased. Slight interval decrease in pneumocephalus. Focus of subdural hemorrhage over the right frontal convexity measuring up to 3 mm in thickness which is stable to slightly decreased. There is additional subdural hemorrhage along the right tentorial leaflet measuring up to 3 mm which is similar. Similar surgical aneurysm clip. ORBITS: No acute abnormality. SINUSES AND MASTOIDS: Mucosal thickening in the paranasal sinuses with air-fluid level in the left maxillary sinus which is new from prior. SOFT TISSUES AND SKULL: Similar postoperative cortices in the right frontotemporal scalp. Soft tissue swelling in the facial soft tissues over the zygomatic arch appears increased. Partially visualized nasoenteric tube. Fluid within the nasopharynx and posterior aspect of the nasal cavity. IMPRESSION: 1. Similar parenchymal hemorrhage in the right temporal lobe with surrounding edema  and local mass effect, resulting in approximately 5 mm leftward midline shift (previously 6 mm). 2. Slightly increased subarachnoid hemorrhage over the left frontoparietal lobes. 3. Layering intraventricular blood products within the occipital horns, similar to prior. 4. Stable to slightly decreased subdural hemorrhage over the right frontal convexity and similar subdural hemorrhage along the right tentorial leaflet, both measuring up to 3 mm in thickness. 5. New mucosal thickening in the paranasal sinuses with air-fluid level in the left maxillary sinus. Electronically signed by: Donnice Mania MD 02/17/2024 05:37 PM EDT RP Workstation: [id number]   ECHOCARDIOGRAM COMPLETE Result Date: 02/17/2024    ECHOCARDIOGRAM REPORT   Patient Name:   Kiearra Oyervides Date of Exam: 02/17/2024 Medical Rec #:  [id number]        Height:       67.0 in Accession #:    [id number]       Weight:       242.5 lb Date of Birth:  1975-08-13        BSA:          2.195 m Patient Age:    48 years         BP:           128/57 mmHg Patient Gender: F                HR:           148 bpm. Exam Location:  Inpatient Procedure: 2D Echo, Color Doppler  and Cardiac Doppler (Both Spectral and Color            Flow Doppler were utilized during procedure). Indications:    Stroke  History:        Patient has prior history of Echocardiogram examinations, most                 recent 04/22/2018. Hemorrage.  Sonographer:    Benard Stallion Referring Phys: ARY CUMMINS IMPRESSIONS  1. Hyperdynamic and tachycardic LV, mild intercavitary gradient of . Left ventricular ejection fraction, by estimation, is >75%. The left ventricle has hyperdynamic function. The left ventricle has no regional wall motion abnormalities. Indeterminate diastolic filling due to E-A fusion.  2. Right ventricular systolic function is normal. The right ventricular size is normal.  3. The mitral valve is normal in structure. No evidence of mitral valve regurgitation. No evidence of mitral  stenosis.  4. The aortic valve is tricuspid. Aortic valve regurgitation is not visualized. No aortic stenosis is present.  5. The inferior vena cava is normal in size with greater than 50% respiratory variability, suggesting right atrial pressure of 3 mmHg. FINDINGS  Left Ventricle: Hyperdynamic and tachycardic LV, mild intercavitary gradient of . Left ventricular ejection fraction, by estimation, is >75%. The left ventricle has hyperdynamic function. The left ventricle has no regional wall motion abnormalities. The left ventricular internal cavity size was normal in size. There is no left ventricular hypertrophy. Indeterminate diastolic filling due to E-A fusion. Right Ventricle: The right ventricular size is normal. No increase in right ventricular wall thickness. Right ventricular systolic function is normal. Left Atrium: Left atrial size was normal in size. Right Atrium: Right atrial size was normal in size. Pericardium: There is no evidence of pericardial effusion. Mitral Valve: The mitral valve is normal in structure. No evidence of mitral valve regurgitation. No evidence of mitral valve stenosis. Tricuspid Valve: The tricuspid valve is normal in structure. Tricuspid valve regurgitation is mild . No evidence of tricuspid stenosis. Aortic Valve: Elevated aortic valve gradients but given significantly elevated LVOT VTI, normal aortic valve appearance and excursion, believe that this is likely secondary to high flow and tachycardia. The aortic valve is tricuspid. Aortic valve regurgitation is not visualized. No aortic stenosis is present. Aortic valve mean gradient measures 22.3 mmHg. Aortic valve peak gradient measures 41.6 mmHg. Aortic valve area, by VTI measures 2.43 cm. Pulmonic Valve: The pulmonic valve was normal in structure. Pulmonic valve regurgitation is not visualized. No evidence of pulmonic stenosis. Aorta: The aortic root is normal in size and structure. Venous: The inferior vena cava is  normal in size with greater than 50% respiratory variability, suggesting right atrial pressure of 3 mmHg. IAS/Shunts: No atrial level shunt detected by color flow Doppler.  LEFT VENTRICLE PLAX 2D LVIDd:         4.40 cm   Diastology LVIDs:         2.60 cm   LV e' medial:    15.00 cm/s LV PW:         1.00 cm   LV E/e' medial:  8.0 LV IVS:        1.00 cm   LV e' lateral:   13.90 cm/s LVOT diam:     2.10 cm   LV E/e' lateral: 8.6 LV SV:         91 LV SV Index:   42 LVOT Area:     3.46 cm  RIGHT VENTRICLE RV Basal diam:  3.50 cm RV Mid  diam:    2.80 cm RV S prime:     31.40 cm/s TAPSE (M-mode): 3.3 cm LEFT ATRIUM             Index        RIGHT ATRIUM           Index LA diam:        3.10 cm 1.41 cm/m   RA Area:     14.20 cm LA Vol (A2C):   46.8 ml 21.32 ml/m  RA Volume:   33.60 ml  15.31 ml/m LA Vol (A4C):   52.3 ml 23.83 ml/m LA Biplane Vol: 52.5 ml 23.92 ml/m  AORTIC VALVE AV Area (Vmax):    2.32 cm AV Area (Vmean):   2.39 cm AV Area (VTI):     2.43 cm AV Vmax:           322.67 cm/s AV Vmean:          214.667 cm/s AV VTI:            0.374 m AV Peak Grad:      41.6 mmHg AV Mean Grad:      22.3 mmHg LVOT Vmax:         216.00 cm/s LVOT Vmean:        148.000 cm/s LVOT VTI:          0.263 m LVOT/AV VTI ratio: 0.70  AORTA Ao Root diam: 2.80 cm Ao Asc diam:  3.40 cm MITRAL VALVE                TRICUSPID VALVE MV Area (PHT): 6.54 cm     TR Peak grad:   22.7 mmHg MV Decel Time: 116 msec     TR Vmax:        238.00 cm/s MV E velocity: 120.00 cm/s MV A velocity: 146.00 cm/s  SHUNTS MV E/A ratio:  0.82         Systemic VTI:  0.26 m                             Systemic Diam: 2.10 cm Morene Brownie Electronically signed by Morene Brownie Signature Date/Time: 02/17/2024/4:58:19 PM    Final    DG Abd Portable 1V Result Date: 02/17/2024 CLINICAL DATA:  Feeding tube placement EXAM: PORTABLE ABDOMEN - 1 VIEW COMPARISON:  02/15/2024 FINDINGS: Feeding tube is transpyloric with the tip of the feeding tube in the duodenum at the  junction of the 3rd and 4th portions. Nonobstructive bowel gas pattern. IMPRESSION: Feeding tube tip at the junction of the 3rd and 4th portions of the duodenum. Electronically Signed   By: Franky Crease M.D.   On: 02/17/2024 12:41   MR BRAIN W WO CONTRAST Result Date: 02/17/2024 EXAM: MRI BRAIN WITH AND WITHOUT CONTRAST 02/17/2024 05:18:21 AM TECHNIQUE: Multiplanar multisequence MRI of the head/brain was performed with and without the administration of intravenous contrast. COMPARISON: CT of the head dated 02/16/2024. CLINICAL HISTORY: Stroke, hemorrhagic. FINDINGS: BRAIN AND VENTRICLES: There are areas of restricted diffusion present within the right temporal lobe, surrounding an intraparenchymal hemorrhage. There are also acute infarcts demonstrated within the insular ribbon, right thalamus posterior limb of the internal capsule and posterior limb of the left internal capsule. There is moderate cerebral swelling of the right temporal lobe. There is no evidence of underlying mass in the right cerebral hemisphere. No hydrocephalus. The sella is unremarkable. Normal flow voids. ORBITS: No acute abnormality. SINUSES:  There is mucosal disease within the left maxillary sinus and within the sphenoid sinuses. BONES AND SOFT TISSUES: Normal bone marrow signal and enhancement. No acute soft tissue abnormality. The patient is status post right temporoparietal craniotomy. IMPRESSION: 1. Acute infarcts in the insular ribbon, right thalamus, posterior limb of the internal capsule, and posterior limb of the left internal capsule. 2. Right temporal lobe intraparenchymal hemorrhage with surrounding restricted diffusion and moderate cerebral swelling. 3. Status post right temporoparietal craniotomy. No evidence of underlying mass in the right cerebral hemisphere. 4. Mucosal disease within the left maxillary sinus and within the sphenoid sinuses. Electronically signed by: evalene coho 02/17/2024 09:55 AM EDT RP Workstation:  [id number]   MR ANGIO HEAD WO CONTRAST Result Date: 02/17/2024 EXAM: MR Angiography Head without intravenous Contrast. 02/17/2024 05:18:52 AM TECHNIQUE: Magnetic resonance angiography images of the head without intravenous contrast. Multiplanar 2D and 3D reformatted images are provided for review. COMPARISON: CT angiogram of the head dated 02/17/2024. CLINICAL HISTORY: Stroke, hemorrhagic. 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 The right A1 branch is nearly occluded. The proximal M1 branch is occluded. There is reconstitution of M2 branches in the sylvian fissure via collaterals. POSTERIOR CIRCULATION: Fetal type origin of the right posterior cerebral artery. IMPRESSION: 1. 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 2. Nearly occluded right A1 branch. 3. Occluded proximal M1 branch with reconstitution of M2 branches in the sylvian fissure via collaterals. 4. Fetal type origin of the right posterior cerebral artery. Electronically signed by: evalene coho 02/17/2024 08:03 AM EDT RP Workstation: [id number]   CT HEAD WO CONTRAST ( ) Result Date: 02/16/2024 CLINICAL DATA:  48 year old female who presented status post under clap headache with severe intracranial hemorrhage, large right temporal lobe hematoma, overlying subdural hematoma, subarachnoid blood. 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 EXAM: CT HEAD WITHOUT CONTRAST TECHNIQUE: Contiguous axial images were obtained from the base of the skull through the vertex without intravenous contrast. RADIATION DOSE REDUCTION: This exam was performed according to the departmental dose-optimization  program which includes automated exposure control, adjustment of the mA and/or kV according to patient size and/or use of iterative reconstruction technique. COMPARISON:  Presentation head CT 0658 hours yesterday. FINDINGS: Brain: Post craniotomy changes with hemostatic low-density material underlying the flap and superimposed small volume pneumocephalus. Debulking of right temporal lobe intra-axial hemorrhage. Surgical aneurysm type clip along the anterior superior margin of the residual hematoma on series 3, image 10. Right side subdural hematoma is smaller, maximal 3-4 mm thickness now as seen on coronal image 45 along the lateral frontal convexity. Subarachnoid blood, which was previously most pronounced in the basilar cisterns and interpeduncular cistern has regressed. Small volume IVH now layering in the occipital horns. Regressed intracranial mass effect. Leftward midline shift decreased, now 4-6 mm, previously 6-9 mm. Compare coronal image 37 now to coronal image 38 yesterday. Improved basilar cistern patency. No ventriculomegaly. Regressed cerebral edema. No brainstem hemorrhage identified (artifacts suspected in the right brainstem yesterday). Temporal lobe are edema on the right persists. Elsewhere gray-white differentiation maintained. Vascular: No suspicious intracranial vascular hyperdensity. Skull: New right side craniotomy.  Otherwise intact. Sinuses/Orbits: Intubated. Visualized paranasal sinuses and mastoids are stable and well aerated. Other: New postoperative changes right  scalp. Skin staples in place. Disconjugate gaze, orbits soft tissues appear negative. IMPRESSION: 1. Satisfactory appearance on postoperative day 1. Substantial evacuation of right temporal lobe hematoma. Decreased right SDH, basilar cistern predominant SAH. And decreased intracranial mass effect. Residual leftward midline shift now 4-6 mm. Improved basilar cistern patency. Small volume new IVH without ventriculomegaly. 2.  Right temporal lobe surgical vascular clips in place. Residual right temporal lobe edema, but no other cerebral edema or infarction identified. Negative for brainstem hemorrhage. 3. No new intracranial abnormality. Electronically Signed   By: VEAR Hurst M.D.   On: 02/16/2024 06:34   DG Abd 1 View Result Date: 02/15/2024 CLINICAL DATA:  Enteric catheter placement EXAM: ABDOMEN - 1 VIEW COMPARISON:  None Available. FINDINGS: Frontal view of the lower chest and upper abdomen demonstrates enteric catheter passing below diaphragm, tip projecting over the gastric body in this patient with heterotaxy with right-sided spleen and stomach noted on previous CT. Unremarkable bowel gas pattern. Lung bases are clear. IMPRESSION: 1. Enteric catheter tip projecting over the gastric body. Electronically Signed   By: Ozell Daring M.D.   On: 02/15/2024 16:30   DG CHEST PORT 1 VIEW Result Date: 02/15/2024 CLINICAL DATA:  48 year old female intubated. Severe intracranial hemorrhage. Abdominal heterotaxy syndrome. EXAM: PORTABLE CHEST 1 VIEW COMPARISON:  CTA neck today.  Chest radiographs 08/06/2023. CT Abdomen and Pelvis 12/14/2023. FINDINGS: Portable AP semi upright view at 0824 hours. Endotracheal tube tip in good position between the clavicles and carina on this image. Enteric tube courses to the abdomen, tip is in the right upper quadrant compatible with satisfactory gastric placement when compared to CT Abdomen and Pelvis 12/14/2023 demonstrating abnormal upper abdominal situs. Mildly lower lung volumes compared to January. Normal cardiac size and mediastinal contours. No pneumothorax, pulmonary edema. No definite pleural effusion or consolidation. Paucity of bowel gas in the upper abdomen. No acute osseous abnormality identified. IMPRESSION: 1. Satisfactory ET tube and enteric tube placement. 2. Lower lung volumes, no acute cardiopulmonary abnormality. 3. Incidental abnormal upper situs abnormality demonstrated on prior CT  Abdomen and Pelvis. Electronically Signed   By: VEAR Hurst M.D.   On: 02/15/2024 08:45   CT HEAD CODE STROKE WO CONTRAST Addendum Date: 02/15/2024 ADDENDUM REPORT: 02/15/2024 07:35 ADDENDUM: Critical Value/emergent results were called by telephone at the time of interpretation on 02/15/2024 at 0717 hours to Dr. Aisha Seals who verbally acknowledged these results. Electronically Signed   By: VEAR Hurst M.D.   On: 02/15/2024 07:35   Result Date: 02/15/2024 CLINICAL DATA:  Code stroke. 48 year old female neurologic deficit. Intubated. EXAM: CT HEAD WITHOUT CONTRAST TECHNIQUE: Contiguous axial images were obtained from the base of the skull through the vertex without intravenous contrast. RADIATION DOSE REDUCTION: This exam was performed according to the departmental dose-optimization program which includes automated exposure control, adjustment of the mA and/or kV according to patient size and/or use of iterative reconstruction technique. COMPARISON:  None Available. FINDINGS: Brain: Large and hyperdense intra-axial hemorrhage throughout the right temporal lobe. Substantial superimposed extension into the subdural space suspected including along the right tentorium, at the floor of the right middle cranial fossa. See coronal images 27, 32. Additionally, there is combined hyperdense and isodense widespread right hemispheric subdural hematoma which is up to 7 mm thickness on the coronal images, especially along the anterior right frontal convexity. This is subtle on axial images. See coronal image 47. Large and lobulated intra-axial hematoma encompasses 73 x 47 by 44 mm (AP by transverse by CC) for an  estimated intra-axial volume of 75 mL. Additionally, a comparatively small volume of subarachnoid hemorrhage is demonstrated in the posterior fossa, especially the prepontine cistern. And furthermore, possible small acute Duret hemorrhagein the brainstem on series 2, image 10. Confluent surrounding right temporal lobe  edema. Intracranial mass effect with leftward midline shift of 9 mm. Early trapping of the left lateral ventricle, dilated left temporal horn. Uncal herniation. All basilar cisterns are partially effaced. Small left basal ganglia vascular calcification. Vascular: No suspicious intracranial vascular hyperdensity. Skull: Intact. Sinuses/Orbits: Visualized paranasal sinuses and mastoids are stable and well aerated. Other: Mildly Disconjugate gaze. Visualized scalp soft tissues are within normal limits. ASPECTS [facility] Stroke Program Early CT Score) Total score (0-10 with 10 being normal): Not applicable, acute hemorrhage. IMPRESSION: 1. Severe acute intracranial hemorrhage, probably originated as intra-axial bleed into the right temporal lobe, with large hematoma there estimated at 75 mL. But also substantial extension into the right subdural space (up to 7 mm thickness), and subarachnoid space extension. Possible Duret hemorrhagein the right brainstem also. 2. Intracranial mass effect with widespread cerebral edema. Right uncal herniation. Trapped left lateral ventricle. Effaced basilar cisterns. Electronically Signed: By: VEAR Hurst M.D. On: 02/15/2024 07:10   CT ANGIO HEAD NECK W WO CM (CODE STROKE) Result Date: 02/15/2024 CLINICAL DATA:  47 year old female code stroke presentation with severe intracranial hemorrhage. Thunderclap headache reported. EXAM: CT ANGIOGRAPHY HEAD AND NECK TECHNIQUE: Multidetector CT imaging of the head and neck was performed using the standard protocol during bolus administration of intravenous contrast. Multiplanar CT image reconstructions and MIPs were obtained to evaluate the vascular anatomy. Carotid stenosis measurements (when applicable) are obtained utilizing NASCET criteria, using the distal internal carotid diameter as the denominator. RADIATION DOSE REDUCTION: This exam was performed according to the departmental dose-optimization program which includes automated exposure  control, adjustment of the mA and/or kV according to patient size and/or use of iterative reconstruction technique. CONTRAST:  75mL OMNIPAQUE  IOHEXOL  350 MG/ML SOLN COMPARISON:  Noncontrast head CT 0658 hours today. FINDINGS: CTA NECK Skeleton: No acute osseous abnormality identified. Upper chest: Intubated. Endotracheal tube tip may extend to the carina, correlate with dedicated chest x-ray. Ground-glass opacity in the upper lungs. Other neck: Satisfactory endotracheal tube course in the neck. Left nasal airway also in place. Fluid in the pharynx. Nonvascular neck soft tissue spaces otherwise within normal limits. Aortic arch: 3 vessel arch appears normal. Right carotid system: Negative right CCA and right carotid bifurcation. Right ICA has a tapered appearance in the neck as it approaches the skull base. Left carotid system: Negative left CCA and left carotid bifurcation. Left ICA patent to the skull base, less tapered appearance. Vertebral arteries: Proximal right subclavian artery and cervical right vertebral arteries are normal. Proximal left subclavian artery and cervical left vertebral arteries are normal, the left vertebral is mildly non dominant. CTA HEAD Posterior circulation: Patent but diminutive vertebrobasilar system, no discrete atherosclerosis or stenosis. Bilateral PICA, SCA and PCA origins remain patent. Right posterior communicating artery is patent. Left posterior communicating artery appears diminutive. Bilateral PCA branches are patent without stenosis. Anterior circulation: Right ICA siphon is tapered and diminutive, and functionally terminates at the right posterior communicating artery, either spasmed or occluded at the right ICA terminus. Likewise, severe spasm or occlusion of the right MCA and ACA origins, A1 and M1 segments. There is regional mass effect there related to the right temporal lobe hematoma. Distally right MCA branches are filling and enhancing relatively symmetric to the  left side (series  9, image 11) although with substantial mass effect from the right temporal lobe hemorrhage. Also, anterior communicating artery and bilateral ACA branches are within normal limits. Contralateral left ICA siphon is patent to the terminus with a more normal appearance. Left MCA and ACA origins appear normal. The left A1 and M1 appear normal. Left MCA bifurcation and branches appear patent and within normal limits. Additionally, positive CTA spot sign along the right inferior right temporal lobe hemorrhage site, see series 13, image 109 and series 10, image 116. This is well away from the abnormally attenuated and elevated right MCA branches. Venous sinuses: Early contrast timing, not well evaluated. Anatomic variants: Dominant right vertebral artery. Review of the MIP images confirms the above findings IMPRESSION: 1. Severe intracranial hemorrhage with Positive CTA Spot Sign along the right inferior temporal lobe intra-axial hematoma. Nonvisualization of the Right ICA terminus and right M1 and A1 segments. These could be occluded or severely spasmed. Associated severe mass effect on the Right ICA terminus and Right MCA branches from the intracranial hemorrhage. No discrete intracranial aneurysm identified. 2. Posterior circulation and left anterior circulation appear more normal, however, generally diminutive. And this may also be an indication of severely elevated intracranial pressure. 3. No superimposed atherosclerosis identified. 4. The above discussed by telephone with Dr. AISHA SEALS on 02/15/2024 at 0728 hours. 5. Additionally, endotracheal tube tip may be at the carina, not included on the CT imaging. Recommend portable chest x-ray when feasible. Electronically Signed   By: VEAR Hurst M.D.   On: 02/15/2024 07:35    Recent Labs: Lab Results  Component Value Date   WBC 16.5 (H) 02/17/2024   HGB 8.6 (L) 02/17/2024   PLT 337 02/17/2024   NA 156 (H) 02/17/2024   K 3.6 02/17/2024   CL  126 (H) 02/17/2024   CO2 23 02/17/2024   GLUCOSE 117 (H) 02/17/2024   BUN 13 02/17/2024   CREATININE 0.92 02/17/2024   BILITOT 0.5 02/15/2024   ALKPHOS 67 02/15/2024   AST 20 02/15/2024   ALT 12 02/15/2024   PROT 7.4 02/15/2024   ALBUMIN 3.6 02/15/2024   CALCIUM 7.9 (L) 02/17/2024   GFRAA >60 02/06/2018   QFTBGOLDPLUS NEGATIVE 08/19/2023    Speciality Comments: No specialty comments available.  Procedures:  No procedures performed Allergies: Patient has no known allergies.   Assessment / Plan:     Visit Diagnoses: Positive ANA (antinuclear antibody)  Other fatigue  Hilar lymphadenopathy  Chronic cough  Severe persistent asthma with acute exacerbation  Abnormal CT of the chest  Primary osteoarthritis of both knees  Vitamin D  deficiency  Irritable bowel syndrome with constipation  Orders: No orders of the defined types were placed in this encounter.  No orders of the defined types were placed in this encounter.   Face-to-face time spent with patient was *** minutes. Greater than 50% of time was spent in counseling and coordination of care.  Follow-Up Instructions: No follow-ups on file.   Waddell CHRISTELLA Craze, PA-C  Note - This record has been created using Dragon software.  Chart creation errors have been sought, but may not always  have been located. Such creation errors do not reflect on  the standard of medical care.

## 2024-02-18 NOTE — Progress Notes (Signed)
  NEUROSURGERY PROGRESS NOTE   No issues overnight.   EXAM:  BP (!) 154/61 (BP Location: Left Arm)   Pulse (!) 125   Temp (!) 100.8 F (38.2 C) (Axillary)   Resp (!) 23   Ht 5' 7.01 (1.702 m)   Wt 110 kg   SpO2 100%   BMI 37.97 kg/m   Intubated, on low-dose propofol : No eye opening Pupils 2-62mm, sluggish Purposeful movement RUE, moves BLE spontaneously.  Minimal movement LUE Wound c/d/i  IMPRESSION:  48 y.o. female POD#3 s/p right temporal crani for evacuation of hematoma, clipping of distal MCA aneurysm, concern for infectious (mycotic) etiology  PLAN: - Cont supportive care per PCCM/neurology - Will remove telfa dressing today, can remove staples in 2 weeks   Gerldine Maizes, MD [facility]

## 2024-02-18 NOTE — Progress Notes (Signed)
 NAME:  Katie Knight, MRN:  [id number], DOB:  [birth year], LOS: 3 ADMISSION DATE:  02/15/2024, CONSULTATION DATE:  02/15/2024 REFERRING MD:  Dr. 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  Stroke code stroke CTA showed large intraparenchymal hemorrhage on the right side with some subdural subarachnoid hemorrhage as well.  Neurosurgery and neurology consulted for further management.  PCCM consulted for admission.  On ED arrival patient was seen mildly hypothermic, bradycardic, and hypertensive.  Lab work significant for potassium 3.3, glucose 216, hemoglobin 11 point.   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 Taken for crani and evacuation. Clipping of distal RMCA aneurysm  8/3 - Agitated overnight. BP remains at upper limit of goal.2L positive.Tmax 100.1. Tolerating SBT  Interim History / Subjective:   8/4 - s/b nsgy: Will remove telfa dressing today, can remove staples in 2 weeks  Doing PSV now On 40% fio2. On diprivan  and fent gtt. FEbrile  T max 102F. RESP CUTURE PENDING  Objective    Blood pressure (!) 160/71, pulse (!) 122, temperature 99.8 F (37.7 C), temperature source Axillary,  resp. rate (!) 23, height 5' 7.01 (1.702 m), weight 110 kg, SpO2 99%.    Vent Mode: PSV;CPAP FiO2 (%):  [40 %] 40 % PEEP:  [5 cmH20] 5 cmH20 Pressure Support:  [5 cmH20] 5 cmH20   Intake/Output Summary (Last 24 hours) at 02/18/2024 1207 Last data filed at 02/18/2024 1000 Gross per 24 hour  Intake 1145.35 ml  Output 1990 ml  Net -844.65 ml   Filed Weights   02/15/24 0647 02/17/24 0722 02/18/24 0723  Weight: 110 kg 110 kg 110 kg    Examination:  Gen:     Middle aged female in NAD HEENT:  ETT, PERRL, no JVD Lungs:    Clear bilateral breath sounds CV:         RRR, no MRG Abd:      Soft, NT, ND Ext:    No acute deformity Neuro:   on fentanyl  propofol . Exam largely deferred until sedation can be weaned.  + gag/cough. Moves RUE to pain.    General Appearance:  Looks criticall ill OBESE - + Head:  Normocephalic, without obvious abnormality, atraumatic Eyes:  PERRL - x, conjunctiva/corneas - mudd     Ears:  Normal external ear canals, both ears Nose:  G tube - x Throat:  ETT TUBE - yesy , OG tube - yes Neck:  Supple,  No enlargement/tenderness/nodules Lungs: Clear to auscultation bilaterally, Ventilator   Synchrony - yes Heart:  S1 and S2 normal, no murmur, CVP - no.  Pressors - no Abdomen:  Soft, no masses, no organomegaly Genitalia / Rectal:  Not done Extremities:  Extremities- intac Skin:  ntact in exposed areas . Sacral area - not examined Neurologic:  Sedation - diprivan  gt ->  RASS - -4 . Moves all 4s - no. CAM-ICU - not tested . Orientation - definitely not      Resolved problem list   Assessment and Plan  Acute intraparenchymal hemorrhage  S/p craniotomy and hematoma evacuation 8/2 with RMCA aneurysm clipping  - NA 156  P: Management per neurology/neurosurgery Seizure precautions  SBP goal per neurology, on cleviprex  for now. 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 SAT/SBT as tolerated, mentation preclude extubation  VAP bundle in place  PAD protocol with prop and fentanyl  for RASS goal 0 to -1.  CTX stopped Hold home CellCept  SIRS with fever Immune suppressed due to cellcept since April 2025   Plan Continue below antibiotics Depending on course will consider BAL  Anti-infectives (From admission, onward)    Start     Dose/Rate Route Frequency Ordered Stop   02/17/24 1500  cefTRIAXone  (ROCEPHIN ) 2 g in sodium chloride  0.9 % 100 mL IVPB        2 g 200 mL/hr over 30 Minutes Intravenous Every 24 hours 02/17/24 1441     02/15/24 0815  cefTRIAXone  (ROCEPHIN ) 2 g in sodium chloride  0.9 % 100 mL IVPB  Status:  Discontinued        2 g 200 mL/hr over 30 Minutes Intravenous Every 24 hours 02/15/24 0812 02/16/24 0824       Therapeutic Hypernatermia   - Na 156  Plan  - 3% saline on hold - goal 150-155   Anemia of critical illness  PLAN - - PRBC for hgb </= 6.9gm%    - exceptions are   -  if ACS susepcted/confirmed then transfuse for hgb </= 8.0gm%,  or    -  active bleeding with hemodynamic instability, then transfuse regardless of hemoglobin value   At at all times try to transfuse 1 unit prbc as possible with exception of active hemorrhage    Hyperglycemia - Unknown history of diabetes with last hemoglobin A1c October 2024 was 5.7  P: moderate scale SSI CBC checks every 4 CBG goal 140-180  Hypertension Hyperlipidemia - Medication reconciliation not yet complete.  Appears patient is on no medications at baseline for hypertension P:  Amlodipine  Losartan  Add hydralazine  50mg  every 6  Keep SBP <   Best Practice (right click and Reselect all SmartList  Selections daily)   Diet/type: NPO. Plan for cotrak and initiation of tube feeds 8/4.  DVT prophylaxis SCD Pressure ulcer(s): N/A GI prophylaxis: PPI Lines: N/A Foley:  Yes, and it is still needed Code Status:  full code Last date of multidisciplinary goals of care discussion: wife and mother updated at bedside. Discussed overview of course of prolonged critical illness including feeding tube, probable tracheostomy, LTACH in best case scenario.   8/5 - mom and daughter Desirae updated at bedside     ATTESTATION & SIGNATURE   The patient Katie Knight is critically ill with multiple organ systems failure and requires high complexity decision making for assessment and support, frequent evaluation and titration of therapies, application of advanced monitoring technologies and extensive interpretation of multiple databases and discussion with other appropriate health care personnel such as bedside nurses, social workers, case  Production designer, theatre/television/film, consultants, respiratory therapists, nutritionists, secretaries etc.,  Critical care time includes but is not restricted to just documentation time. Documentation can happen in parallel or sequential to care time depending on case mix urgency and priorities for the shift. So, overall critical Care Time devoted to patient care services described in this note is  30  Minutes.   This time reflects time of care of this signee Dr Dorethia Cave which includ does not reflect procedure time, or teaching time or supervisory time of PA/NP/Med student/Med Resident etc but could involve care discussion time     Dr. Dorethia Cave, M.D., [facility].C.P Pulmonary and Critical Care Medicine Staff Physician, [facility] Pager: [phone], If no answer or between  15:00h - 7:00h: call [phone]  02/18/2024 1:15 PM    LABS    PULMONARY Recent Labs  Lab 02/15/24 0653 02/15/24 0751 02/15/24 1050 02/15/24 1128  02/15/24 1508  PHART  --  7.307*  --  7.416 7.387  PCO2ART  --  50.8*  --  37.4 42.1  PO2ART  --  53*  --  438* 259*  HCO3  --  25.8  --  24.8 25.4  TCO2 21* 27 25 26 27   O2SAT  --  86  --  100 100    CBC Recent Labs  Lab 02/15/24 0659 02/15/24 0751 02/15/24 1508 02/16/24 0613 02/17/24 0626  HGB 11.1*   < > 10.5* 9.7* 8.6*  HCT 35.5*   < > 31.0* 31.1* 29.3*  WBC 8.0  --   --  19.4* 16.5*  PLT 412*  --   --  359 337   < > = values in this interval not displayed.    COAGULATION Recent Labs  Lab 02/15/24 1316  INR 1.0    CARDIAC  No results for input(s): TROPONINI in the last 168 hours. No results for input(s): PROBNP in the last 168 hours.   CHEMISTRY Recent Labs  Lab 02/15/24 0653 02/15/24 0659 02/15/24 0751 02/15/24 1050 02/15/24 1128 02/16/24 0613 02/16/24 1142 02/16/24 1840 02/17/24 0024 02/17/24 0626 02/17/24 1137 02/17/24 1630  NA 136 135   < > 139   < > 148*   < > 155* 154* 155* 159* 156*  K 3.1* 3.3*   < > 3.4*   < > 3.7  --   --   --  3.6  --   --   CL 104 101  --  100  --  118*  --   --   --  126*  --   --   CO2  --  22  --   --   --  22  --   --   --  23  --   --   GLUCOSE 216* 216*  --  136*  --  133*  --   --   --  117*  --   --   BUN 8 9  --  8  --  8  --   --   --  13  --   --   CREATININE 0.70 0.85  --  0.70  --  0.78  --   --   --  0.92  --   --   CALCIUM  --  8.7*  --   --   --  8.1*  --   --   --  7.9*  --   --   MG  --   --   --   --   --  2.0  --   --   --   --  2.0  --   PHOS  --   --   --   --   --  3.0  --   --   --   --  2.6  --    < > = values in this interval not displayed.   Estimated Creatinine Clearance: 97.7 mL/min (by C-G formula based on SCr of 0.92 mg/dL).   LIVER Recent Labs  Lab 02/15/24 0659 02/15/24 1316  AST 20  --   ALT 12  --   ALKPHOS 67  --   BILITOT 0.5  --   PROT 7.4  --   ALBUMIN 3.6  --   INR  --  1.0     INFECTIOUS Recent Labs  Lab 02/15/24 1336  PROCALCITON <0.10      ENDOCRINE CBG (last 3)  Recent Labs    02/18/24 0338 02/18/24 0735 02/18/24 1156  GLUCAP 115* 122* 128*         IMAGING x48h  - image(s) personally visualized  -   highlighted in bold CT HEAD WO CONTRAST ( ) Result Date: 02/17/2024 EXAM: CT HEAD WITHOUT 02/17/2024 05:06:13 PM TECHNIQUE: CT of the head was performed without the administration of intravenous contrast. Automated exposure control, iterative reconstruction, and/or weight based adjustment of the mA/kV was utilized to reduce the radiation dose to as low as reasonably achievable. COMPARISON: None available. CLINICAL HISTORY: Stroke, follow up. FINDINGS: BRAIN AND VENTRICLES: Similar post craniotomy changes with similar appearance of parenchymal hemorrhage primarily within the right temporal lobe. Similar surrounding edema with local mass effect. Approximately 5 mm leftward midline shift, previously 6 mm. There are areas of layering intraventricular blood products within the occipital horns which appears similar to prior. Few areas of subarachnoid hemorrhage over the left frontoparietal lobes are slightly increased. Slight interval decrease in pneumocephalus. Focus of subdural hemorrhage over the right frontal convexity measuring up to 3 mm in thickness which is stable to slightly decreased. There is additional subdural hemorrhage along the right tentorial leaflet measuring up to 3 mm which is similar. Similar surgical aneurysm clip. ORBITS: No acute abnormality. SINUSES AND MASTOIDS: Mucosal thickening in the paranasal sinuses with air-fluid level in the left maxillary sinus which is new from prior. SOFT TISSUES AND SKULL: Similar postoperative cortices in the right frontotemporal scalp. Soft tissue swelling in the facial soft tissues over the zygomatic arch appears increased. Partially visualized nasoenteric tube. Fluid within the nasopharynx and posterior aspect of the nasal cavity. IMPRESSION: 1. Similar parenchymal hemorrhage  in the right temporal lobe with surrounding edema and local mass effect, resulting in approximately 5 mm leftward midline shift (previously 6 mm). 2. Slightly increased subarachnoid hemorrhage over the left frontoparietal lobes. 3. Layering intraventricular blood products within the occipital horns, similar to prior. 4. Stable to slightly decreased subdural hemorrhage over the right frontal convexity and similar subdural hemorrhage along the right tentorial leaflet, both measuring up to 3 mm in thickness. 5. New mucosal thickening in the paranasal sinuses with air-fluid level in the left maxillary sinus. Electronically signed by: Donnice Mania MD 02/17/2024 05:37 PM EDT RP Workstation: [id number]   ECHOCARDIOGRAM COMPLETE Result Date: 02/17/2024    ECHOCARDIOGRAM REPORT   Patient Name:   Katie Knight Date of Exam: 02/17/2024 Medical Rec #:  [id number]        Height:       67.0 in Accession #:    [id number]  Weight:       242.5 lb Date of Birth:  1975-07-21        BSA:          2.195 m Patient Age:    48 years         BP:           128/57 mmHg Patient Gender: F                HR:           148 bpm. Exam Location:  Inpatient Procedure: 2D Echo, Color Doppler and Cardiac Doppler (Both Spectral and Color            Flow Doppler were utilized during procedure). Indications:    Stroke  History:        Patient has prior history of Echocardiogram examinations, most                 recent 04/22/2018. Hemorrage.  Sonographer:    Benard Stallion Referring Phys: ARY CUMMINS IMPRESSIONS  1. Hyperdynamic and tachycardic LV, mild intercavitary gradient of . Left ventricular ejection fraction, by estimation, is >75%. The left ventricle has hyperdynamic function. The left ventricle has no regional wall motion abnormalities. Indeterminate diastolic filling due to E-A fusion.  2. Right ventricular systolic function is normal. The right ventricular size is normal.  3. The mitral valve is normal in structure. No evidence of  mitral valve regurgitation. No evidence of mitral stenosis.  4. The aortic valve is tricuspid. Aortic valve regurgitation is not visualized. No aortic stenosis is present.  5. The inferior vena cava is normal in size with greater than 50% respiratory variability, suggesting right atrial pressure of 3 mmHg. FINDINGS  Left Ventricle: Hyperdynamic and tachycardic LV, mild intercavitary gradient of . Left ventricular ejection fraction, by estimation, is >75%. The left ventricle has hyperdynamic function. The left ventricle has no regional wall motion abnormalities. The left ventricular internal cavity size was normal in size. There is no left ventricular hypertrophy. Indeterminate diastolic filling due to E-A fusion. Right Ventricle: The right ventricular size is normal. No increase in right ventricular wall thickness. Right ventricular systolic function is normal. Left Atrium: Left atrial size was normal in size. Right Atrium: Right atrial size was normal in size. Pericardium: There is no evidence of pericardial effusion. Mitral Valve: The mitral valve is normal in structure. No evidence of mitral valve regurgitation. No evidence of mitral valve stenosis. Tricuspid Valve: The tricuspid valve is normal in structure. Tricuspid valve regurgitation is mild . No evidence of tricuspid stenosis. Aortic Valve: Elevated aortic valve gradients but given significantly elevated LVOT VTI, normal aortic valve appearance and excursion, believe that this is likely secondary to high flow and tachycardia. The aortic valve is tricuspid. Aortic valve regurgitation is not visualized. No aortic stenosis is present. Aortic valve mean gradient measures 22.3 mmHg. Aortic valve peak gradient measures 41.6 mmHg. Aortic valve area, by VTI measures 2.43 cm. Pulmonic Valve: The pulmonic valve was normal in structure. Pulmonic valve regurgitation is not visualized. No evidence of pulmonic stenosis. Aorta: The aortic root is normal in size and  structure. Venous: The inferior vena cava is normal in size with greater than 50% respiratory variability, suggesting right atrial pressure of 3 mmHg. IAS/Shunts: No atrial level shunt detected by color flow Doppler.  LEFT VENTRICLE PLAX 2D LVIDd:         4.40 cm   Diastology LVIDs:  2.60 cm   LV e' medial:    15.00 cm/s LV PW:         1.00 cm   LV E/e' medial:  8.0 LV IVS:        1.00 cm   LV e' lateral:   13.90 cm/s LVOT diam:     2.10 cm   LV E/e' lateral: 8.6 LV SV:         91 LV SV Index:   42 LVOT Area:     3.46 cm  RIGHT VENTRICLE RV Basal diam:  3.50 cm RV Mid diam:    2.80 cm RV S prime:     31.40 cm/s TAPSE (M-mode): 3.3 cm LEFT ATRIUM             Index        RIGHT ATRIUM           Index LA diam:        3.10 cm 1.41 cm/m   RA Area:     14.20 cm LA Vol (A2C):   46.8 ml 21.32 ml/m  RA Volume:   33.60 ml  15.31 ml/m LA Vol (A4C):   52.3 ml 23.83 ml/m LA Biplane Vol: 52.5 ml 23.92 ml/m  AORTIC VALVE AV Area (Vmax):    2.32 cm AV Area (Vmean):   2.39 cm AV Area (VTI):     2.43 cm AV Vmax:           322.67 cm/s AV Vmean:          214.667 cm/s AV VTI:            0.374 m AV Peak Grad:      41.6 mmHg AV Mean Grad:      22.3 mmHg LVOT Vmax:         216.00 cm/s LVOT Vmean:        148.000 cm/s LVOT VTI:          0.263 m LVOT/AV VTI ratio: 0.70  AORTA Ao Root diam: 2.80 cm Ao Asc diam:  3.40 cm MITRAL VALVE                TRICUSPID VALVE MV Area (PHT): 6.54 cm     TR Peak grad:   22.7 mmHg MV Decel Time: 116 msec     TR Vmax:        238.00 cm/s MV E velocity: 120.00 cm/s MV A velocity: 146.00 cm/s  SHUNTS MV E/A ratio:  0.82         Systemic VTI:  0.26 m                             Systemic Diam: 2.10 cm Morene Brownie Electronically signed by Morene Brownie Signature Date/Time: 02/17/2024/4:58:19 PM    Final    DG Abd Portable 1V Result Date: 02/17/2024 CLINICAL DATA:  Feeding tube placement EXAM: PORTABLE ABDOMEN - 1 VIEW COMPARISON:  02/15/2024 FINDINGS: Feeding tube is transpyloric with the tip  of the feeding tube in the duodenum at the junction of the 3rd and 4th portions. Nonobstructive bowel gas pattern. IMPRESSION: Feeding tube tip at the junction of the 3rd and 4th portions of the duodenum. Electronically Signed   By: Franky Crease M.D.   On: 02/17/2024 12:41   MR BRAIN W WO CONTRAST Result Date: 02/17/2024 EXAM: MRI BRAIN WITH AND WITHOUT CONTRAST 02/17/2024 05:18:21 AM TECHNIQUE: Multiplanar multisequence MRI of the head/brain was performed  with and without the administration of intravenous contrast. COMPARISON: CT of the head dated 02/16/2024. CLINICAL HISTORY: Stroke, hemorrhagic. FINDINGS: BRAIN AND VENTRICLES: There are areas of restricted diffusion present within the right temporal lobe, surrounding an intraparenchymal hemorrhage. There are also acute infarcts demonstrated within the insular ribbon, right thalamus posterior limb of the internal capsule and posterior limb of the left internal capsule. There is moderate cerebral swelling of the right temporal lobe. There is no evidence of underlying mass in the right cerebral hemisphere. No hydrocephalus. The sella is unremarkable. Normal flow voids. ORBITS: No acute abnormality. SINUSES: There is mucosal disease within the left maxillary sinus and within the sphenoid sinuses. BONES AND SOFT TISSUES: Normal bone marrow signal and enhancement. No acute soft tissue abnormality. The patient is status post right temporoparietal craniotomy. IMPRESSION: 1. Acute infarcts in the insular ribbon, right thalamus, posterior limb of the internal capsule, and posterior limb of the left internal capsule. 2. Right temporal lobe intraparenchymal hemorrhage with surrounding restricted diffusion and moderate cerebral swelling. 3. Status post right temporoparietal craniotomy. No evidence of underlying mass in the right cerebral hemisphere. 4. Mucosal disease within the left maxillary sinus and within the sphenoid sinuses. Electronically signed by: evalene coho  02/17/2024 09:55 AM EDT RP Workstation: [id number]   MR ANGIO HEAD WO CONTRAST Result Date: 02/17/2024 EXAM: MR Angiography Head without intravenous Contrast. 02/17/2024 05:18:52 AM TECHNIQUE: Magnetic resonance angiography images of the head without intravenous contrast. Multiplanar 2D and 3D reformatted images are provided for review. COMPARISON: CT angiogram of the head dated 02/17/2024. CLINICAL HISTORY: Stroke, hemorrhagic. 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 The right A1 branch is nearly occluded. The proximal M1 branch is occluded. There is reconstitution of M2 branches in the sylvian fissure via collaterals. POSTERIOR CIRCULATION: Fetal type origin of the right posterior cerebral artery. IMPRESSION: 1. 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 2. Nearly occluded right A1 branch. 3. Occluded proximal M1 branch with reconstitution of M2 branches in the sylvian fissure via collaterals. 4. Fetal type origin of the right posterior cerebral artery. Electronically signed by: evalene coho 02/17/2024 08:03 AM EDT RP Workstation: [id number]

## 2024-02-19 ENCOUNTER — Inpatient Hospital Stay (HOSPITAL_COMMUNITY)

## 2024-02-19 DIAGNOSIS — R6510 Systemic inflammatory response syndrome (SIRS) of non-infectious origin without acute organ dysfunction: Secondary | ICD-10-CM

## 2024-02-19 DIAGNOSIS — I619 Nontraumatic intracerebral hemorrhage, unspecified: Secondary | ICD-10-CM | POA: Diagnosis not present

## 2024-02-19 DIAGNOSIS — J9602 Acute respiratory failure with hypercapnia: Secondary | ICD-10-CM | POA: Diagnosis not present

## 2024-02-19 DIAGNOSIS — G936 Cerebral edema: Secondary | ICD-10-CM | POA: Diagnosis not present

## 2024-02-19 DIAGNOSIS — D649 Anemia, unspecified: Secondary | ICD-10-CM

## 2024-02-19 DIAGNOSIS — J9601 Acute respiratory failure with hypoxia: Secondary | ICD-10-CM | POA: Diagnosis not present

## 2024-02-19 LAB — CBC
HCT: 28.9 % — ABNORMAL LOW (ref 36.0–46.0)
HCT: 29.2 % — ABNORMAL LOW (ref 36.0–46.0)
Hemoglobin: 8.3 g/dL — ABNORMAL LOW (ref 12.0–15.0)
Hemoglobin: 8.5 g/dL — ABNORMAL LOW (ref 12.0–15.0)
MCH: 29.2 pg (ref 26.0–34.0)
MCH: 29.6 pg (ref 26.0–34.0)
MCHC: 28.7 g/dL — ABNORMAL LOW (ref 30.0–36.0)
MCHC: 29.1 g/dL — ABNORMAL LOW (ref 30.0–36.0)
MCV: 101.7 fL — ABNORMAL HIGH (ref 80.0–100.0)
MCV: 101.8 fL — ABNORMAL HIGH (ref 80.0–100.0)
Platelets: 303 K/uL (ref 150–400)
Platelets: 322 K/uL (ref 150–400)
RBC: 2.84 MIL/uL — ABNORMAL LOW (ref 3.87–5.11)
RBC: 2.87 MIL/uL — ABNORMAL LOW (ref 3.87–5.11)
RDW: 14.1 % (ref 11.5–15.5)
RDW: 14.3 % (ref 11.5–15.5)
WBC: 12.1 K/uL — ABNORMAL HIGH (ref 4.0–10.5)
WBC: 14.2 K/uL — ABNORMAL HIGH (ref 4.0–10.5)
nRBC: 0 % (ref 0.0–0.2)
nRBC: 0 % (ref 0.0–0.2)

## 2024-02-19 LAB — GLUCOSE, CAPILLARY
Glucose-Capillary: 112 mg/dL — ABNORMAL HIGH (ref 70–99)
Glucose-Capillary: 119 mg/dL — ABNORMAL HIGH (ref 70–99)
Glucose-Capillary: 122 mg/dL — ABNORMAL HIGH (ref 70–99)
Glucose-Capillary: 125 mg/dL — ABNORMAL HIGH (ref 70–99)
Glucose-Capillary: 128 mg/dL — ABNORMAL HIGH (ref 70–99)
Glucose-Capillary: 133 mg/dL — ABNORMAL HIGH (ref 70–99)

## 2024-02-19 LAB — BASIC METABOLIC PANEL WITH GFR
Anion gap: 7 (ref 5–15)
BUN: 24 mg/dL — ABNORMAL HIGH (ref 6–20)
CO2: 29 mmol/L (ref 22–32)
Calcium: 8.7 mg/dL — ABNORMAL LOW (ref 8.9–10.3)
Chloride: 126 mmol/L — ABNORMAL HIGH (ref 98–111)
Creatinine, Ser: 0.96 mg/dL (ref 0.44–1.00)
GFR, Estimated: >60 mL/min (ref >60)
Glucose, Bld: 121 mg/dL — ABNORMAL HIGH (ref 70–99)
Potassium: 3.7 mmol/L (ref 3.5–5.1)
Sodium: 162 mmol/L (ref 135–145)

## 2024-02-19 LAB — SODIUM
Sodium: 160 mmol/L — ABNORMAL HIGH (ref 135–145)
Sodium: 161 mmol/L (ref 135–145)
Sodium: 161 mmol/L (ref 135–145)

## 2024-02-19 LAB — MAGNESIUM: Magnesium: 2.4 mg/dL (ref 1.7–2.4)

## 2024-02-19 LAB — PHOSPHORUS: Phosphorus: 3.6 mg/dL (ref 2.5–4.6)

## 2024-02-19 MED ORDER — HEPARIN SODIUM (PORCINE) 5000 UNIT/ML IJ SOLN
5000.0000 [IU] | Freq: Three times a day (TID) | INTRAMUSCULAR | Status: DC
  Administered 2024-02-19 – 2024-02-26 (×28): 5000 [IU] via SUBCUTANEOUS
  Filled 2024-02-19 (×21): qty 1

## 2024-02-19 MED ORDER — LINACLOTIDE 145 MCG PO CAPS
145.0000 ug | ORAL_CAPSULE | Freq: Every day | ORAL | Status: DC
  Administered 2024-02-19 – 2024-03-03 (×17): 145 ug
  Filled 2024-02-19 (×16): qty 1

## 2024-02-19 MED ORDER — FREE WATER
100.0000 mL | Status: DC
  Administered 2024-02-19 – 2024-02-21 (×10): 100 mL

## 2024-02-19 MED ORDER — BISACODYL 10 MG RE SUPP
10.0000 mg | Freq: Every day | RECTAL | Status: DC | PRN
  Administered 2024-02-21: 10 mg via RECTAL
  Filled 2024-02-19: qty 1

## 2024-02-19 NOTE — Progress Notes (Signed)
 Pt transported on ventilator to CT and back without complications.

## 2024-02-19 NOTE — Progress Notes (Addendum)
 STROKE TEAM PROGRESS NOTE   SUBJECTIVE (INTERIM HISTORY)  Wife is at bedside. Patient is demonstrating continued improvement in responsiveness compared to yesterday. Hypertension has improved with increased antihypertensive regimen. Sedation has been restarted due to increased coughing and LE movements. Plans include taking off sedation again to monitor responsiveness and continue to monitor in ICU.    OBJECTIVE Vitals:   02/19/24 0700 02/19/24 0800 02/19/24 0900 02/19/24 1000  BP: 130/63 (!) 146/74 132/68 (!) 147/66  Pulse: (!) 105 (!) 108 (!) 101 100  Resp: (!) 24 (!) 22 (!) 21 (!) 26  Temp:  98.9 F (37.2 C)    TempSrc:  Axillary    SpO2: 99% 98% 100% 99%  Weight:      Height:        CBC    Component Value Date/Time   WBC 12.1 (H) 02/19/2024 0545   RBC 2.84 (L) 02/19/2024 0545   HGB 8.3 (L) 02/19/2024 0545   HCT 28.9 (L) 02/19/2024 0545   PLT 303 02/19/2024 0545   MCV 101.8 (H) 02/19/2024 0545   MCH 29.2 02/19/2024 0545   MCHC 28.7 (L) 02/19/2024 0545   RDW 14.1 02/19/2024 0545   LYMPHSABS 1.0 02/15/2024 0659   MONOABS 0.6 02/15/2024 0659   EOSABS 0.0 02/15/2024 0659   BASOSABS 0.0 02/15/2024 0659    BMET    Component Value Date/Time   NA 162 (HH) 02/19/2024 0545   NA 137 05/07/2023 0845   K 3.7 02/19/2024 0545   CL 126 (H) 02/19/2024 0545   CO2 29 02/19/2024 0545   GLUCOSE 121 (H) 02/19/2024 0545   BUN 24 (H) 02/19/2024 0545   BUN 15 05/07/2023 0845   CREATININE 0.96 02/19/2024 0545   CREATININE 0.74 08/19/2023 1015   CALCIUM 8.7 (L) 02/19/2024 0545   EGFR 102 08/19/2023 1015   EGFR 91 05/07/2023 0845   GFRNONAA >60 02/19/2024 0545    IMAGING past 24 hours CT HEAD WO CONTRAST ( ) Result Date: 02/19/2024 EXAM: CT HEAD WITHOUT CONTRAST 02/19/2024 05:26:14 AM TECHNIQUE: CT of the head was performed without the administration of intravenous contrast. Automated exposure control, iterative reconstruction, and/or weight based adjustment of the mA/kV was  utilized to reduce the radiation dose to as low as reasonably achievable. COMPARISON: CT head without contrast dated 10/18/2023 and 02/15/2024. CLINICAL HISTORY: Stroke, follow up. FINDINGS: BRAIN AND VENTRICLES: The right temporal lobe hemorrhage is similar in size compared to the prior studies. Expected evolution of surrounding vasogenic edema is noted. Edematous changes and probable infarct are noted in the posterior limb of the internal capsule, stable. Partial effacement of the right lateral ventricle is stable. Minimal intraventricular hemorrhage is again noted in the dependent portions of the lateral ventricles. No hydrocephalus is present. ORBITS: No acute abnormality. SINUSES: No acute abnormality. SOFT TISSUES AND SKULL: Pneumocephalus is resolving. Right craniotomy again noted. Calvarium is otherwise within normal limits. Surgical clip is again noted at the right MCA bifurcation. IMPRESSION: 1. Stable right temporal lobe hemorrhage with expected evolution of surrounding vasogenic edema. 2. Stable edematous changes and probable infarct in the posterior limb of the internal capsule. 3. Stable partial effacement of the right lateral ventricle. 4. Minimal intraventricular hemorrhage in the dependent portions of the lateral ventricles, stable. 5. No hydrocephalus. Electronically signed by: Lonni Necessary MD 02/19/2024 05:41 AM EDT RP Workstation: [id number]   US  EKG SITE RITE Result Date: 02/18/2024 If Site Rite image not attached, placement could not be confirmed due to current cardiac rhythm.   PHYSICAL  EXAM General:  Well-nourished, well-developed, intubated on sedation CV: Regular rhythm with tachycardia Respiratory:  Regular, unlabored respirations on room air     NEURO: Intubated on sedation and vent, rest with eyes closed and slight response to name but not able follow commands. Pupils are 1.5 mm, midline, and reactive. Present doll's eye and corneal reflex. Positive gag. RUE response to  noxious with weak LUE response. Bilateral lower extremeties response to noxious. Sensation, coordination, and gait not tested.    Most Recent NIH  Dizziness Present: No (08/06 0800) Headache Present: No (08/06 0800) Interval: Shift assessment (08/06 0800) Level of Consciousness (1a.)   : Not alert, requires repeated stimulation to attend, or is obtunded and requires strong or painful stimulation to make movements (not stereotyped) (08/06 0800) LOC Questions (1b. )   : Answers neither question correctly (08/06 0800) LOC Commands (1c. )   : Performs neither task correctly (08/06 0800) Best Gaze (2. )  : Normal (08/06 0800) Visual (3. )  : Bilateral hemianopia (blind including cortical blindness) (08/06 0800) Facial Palsy (4. )    : Partial paralysis  (08/06 0800) Motor Arm, Left (5a. )   : No effort against gravity (08/06 0800) Motor Arm, Right (5b. ) : Some effort against gravity (08/06 0800) Motor Leg, Left (6a. )  : No effort against gravity (08/06 0800) Motor Leg, Right (6b. ) : No effort against gravity (08/06 0800) Limb Ataxia (7. ): Absent (08/06 0800) Sensory (8. )  : Severe to total sensory loss, patient is not aware of being touched in the face, arm, and leg (08/06 0800) Best Language (9. )  : Mute, global aphasia (08/06 0800) Dysarthria (10. ): Intubated or other physical barrier (08/06 0800) Extinction/Inattention (11.)   : Profound hemi-inattention or extinction to more than one modality (08/06 0800) Complete NIHSS TOTAL: 29 (08/06 0800)     ASSESSMENT/PLAN [name] is a 48 y.o. female with history of interstitial lung disease on CellCept, hypertension, hyperlipidemia, asthma admitted for headache, nausea vomiting and altered mental status. No TNK given due to [facility]. Patient more responsive as compared to yesterday, with slight response to verbal and increased movement in extremities. Repeat CT today showed the ICH continues to be stable. Hypertension has improved on  addition of bid coreg . We will plan to take off sedation once more and monitor responsiveness. Continue ICU care.      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 right MCA moyamoya syndrome CT head right temporal large ICH with SDH, possible pseudo SAH, Duret hemorrhage with cerebral edema CT head and neck possible spot sign, right MCA occlusion versus vasospasm versus moyamoya syndrome Status post hematoma evacuation CT head substantial evacuation of right temporal lobe hematoma, decreased right SDH, basilar cistern predominant SAH and decreased mass effect, residual leftward midline shift 4 to 6 mm, stable volume new IVH without ventriculomegaly, right temporal lobe surgical vascular clips in place MRI brain Right temporal lobe intraparenchymal hemorrhage stable. 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  Nearly occluded right A1 branch.  Occluded proximal M1 branch with reconstitution of M2 branches in the sylvian fissure via collaterals 2D Echo EF > 75% LDL 74  HgbA1c 5.3 UDS negative Heparin  subcu for VTE prophylaxis No antithrombotic prior to admission, now on No antithrombotic due to ICH Ongoing aggressive stroke risk factor management Therapy recommendations: Pending Disposition: Pending   Cerebral edema Brain herniation CT head 8/2 showed intracranial mass effect with widespread cerebral edema, right uncal herniation, trapped left lateral ventricle, effaced basilar cisterns, possible Duret hemorrhage in the right brainstem Status post hematoma evacuation with Dr. Lanis CT head 8/3 substantial evacuation of right temporal lobe hematoma, decreased right  SDH, basilar cistern predominant SAH and decreased mass effect, residual leftward midline shift 4 to 6 mm, stable volume new IVH without ventriculomegaly, right temporal lobe surgical vascular clips in place CT 8/4 Similar parenchymal hemorrhage in the right temporal lobe with surrounding edema and local mass effect, resulting in approximately 5 mm leftward midline shift (previously 6 mm). CT 8/6 stable right temporal lobe hemorrhage with expected evolution of surrounding vasogenic edema. Stable edematous changes and stable partial effacement of right lateral ventricle. No hydrocephalus.  Status post 23.4% bolus 8/2 Now on 3% saline @ 75 -> 50-> off Na Q6 with goal 150-155 Na 137--141--140--149-155-159--165--161 Add free water  100 cc every 4 hours   Respiratory failure Intubated on sedation Vent management per CCM Still on propofol , off fentanyl  May need tracheostomy   ? Mycotic aneurysm Interstitial lung disease on CellCept Fever  S/p Right temporal craniotomy for evacuation of hematoma with clipping of distal RMCA aneurysm  Concerning for mycotic aneurysm, especially that pt is on cellcept with ILD Blood culture NGTD Tmax 101-100.5--100 Tachycardia improved Leukocytosis WBC 19.4--16.5--12.1 On Rocephin    Hypertension Mildly hypertensive, or Norvasc  10 and losartan  100 Off Cleviprex    On amlodipine  10, hydralazine  50 Q6h, losartan  100 daily, coreg  12.5 bid BP goal less than 160 Long-term BP goal normotensive   Hyperlipidemia Home meds: None LDL 74, goal < 70 Consider low dose statin at discharge   Dysphagia NPO S/p cortrak On tube feeding at 50cc , Free water  100 cc every 4h   Other Stroke Risk Factors Obesity, Body mass index is 37.97 kg/m.    Other Active Problems Asthma Tachycardia on coreg , improved     Hospital day # 4   Alan Maiden, MD PGY-1  ATTENDING NOTE: I reviewed above note and agree with the assessment and plan. Pt was seen and examined.    Wife at bedside.  Patient still intubated, but seems more interactive than yesterday.  On voice, opens eyes halfway, still not follow commands, with eye-opening, eyes midline, doll's eye present, corneal reflexes present, gag and cough present.  With pinpoint central eye, right arm and leg withdrawal more than left, reactive to yesterday.  CT repeat in a.m. stable, sodium 161, started on free water .  Continue tube feeding.  Off 3%.  Continue antibiotics.  For detailed assessment and plan, please refer to above as I have made changes wherever appropriate.   Ary Cummins, MD PhD Stroke Neurology 02/19/2024 5:54 PM  This patient is critically ill due to large ICH and SDH status post hematoma evacuation, respiratory failure, cerebral edema and at significant risk of neurological worsening, death form brain herniation, brain death. This patient's care requires constant monitoring of vital signs, hemodynamics, respiratory and cardiac monitoring, review of multiple databases, neurological assessment, discussion with family, other specialists and medical decision making of high complexity. I spent 35 minutes of neurocritical care time in the care of this patient. I had long discussion with her wife at bedside, updated pt current condition, treatment plan  and potential prognosis, and answered all the questions. She expressed understanding and appreciation.    To contact Stroke Continuity provider, please refer to [email or web address]. After hours, contact General Neurology

## 2024-02-19 NOTE — Progress Notes (Signed)
 NAME:  Katie Knight, MRN:  [id number], DOB:  [birth year], LOS: 4 ADMISSION DATE:  02/15/2024, CONSULTATION DATE:  02/15/2024 REFERRING MD:  Dr. 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  Stroke code stroke CTA showed large intraparenchymal hemorrhage on the right side with some subdural subarachnoid hemorrhage as well.  Neurosurgery and neurology consulted for further management.  PCCM consulted for admission.  On ED arrival patient was seen mildly hypothermic, bradycardic, and hypertensive.  Lab work significant for potassium 3.3, glucose 216, hemoglobin 11 point.   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 Taken for crani and evacuation. Clipping of distal RMCA aneurysm  8/3 - Agitated overnight. BP remains at upper limit of goal.2L positive.Tmax 100.1. Tolerating SBT 8/6 repeat head CT with stable right temporal lobe hemorrhage with expected evolution of surrounding vasogenic edema and stable edematous changes and probable infarct in the posterior limb of the internal capsule  Interim History / Subjective:  Opens eyes to physical stimuli Family at bedside and updated  Objective     Blood pressure 135/69, pulse 94, temperature 99.6 F (37.6 C), temperature source Axillary, resp. rate 20, height 5' 7.01 (1.702 m), weight 110 kg, SpO2 99%.    Vent Mode: PRVC FiO2 (%):  [40 %] 40 % Set Rate:  [20 bmp] 20 bmp Vt Set:  [500 mL] 500 mL PEEP:  [5 cmH20] 5 cmH20 Pressure Support:  [5 cmH20] 5 cmH20 Plateau Pressure:  [16 cmH20-19 cmH20] 16 cmH20   Intake/Output Summary (Last 24 hours) at 02/19/2024 0717 Last data filed at 02/19/2024 0600 Gross per 24 hour  Intake 2782.28 ml  Output 2150 ml  Net 632.28 ml   Filed Weights   02/15/24 0647 02/17/24 0722 02/18/24 0723  Weight: 110 kg 110 kg 110 kg    Examination:  General: Acute ill-appearing adult female lying in bed on mechanical ventilation in no distress HEENT: ETT, MM pink/moist, PERRL,  Neuro: Opens eyes to verbal stimuli not following commands CV: s1s2 regular rate and rhythm, no murmur, rubs, or gallops,  PULM: Clear to auscultation bilaterally, no increased work of breathing, no added breath sounds, tolerating ventilator GI: soft, bowel sounds active in all 4 quadrants, non-tender, non-distended, tolerating TF Extremities: warm/dry, no edema  Skin: no rashes or lesions  Assessment and Plan  Acute intraparenchymal hemorrhage s/p craniotomy and hematoma evacuation 8/2 with RMCA aneurysm clipping - 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 Cerebral edema with brain herniation Concern for underlying mycotic aneurysm in the setting of immunosuppression P: Management per neurology/neurosurgery Maintain neuro protective measures; goal for eurothermia, euglycemia, eunatermia, normoxia, and PCO2 goal of 35-40 Nutrition and bowel regiment  Seizure precautions  AEDs per neurology  Aspirations precautions  Frequent neurochecks Repeat imaging per neurosurgery  Acute hypoxic and hypercapnic respiratory failure secondary to above ILD with  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 Head of bed elevated 30 degrees. Plateau pressures less than 30 cm H20.  Follow intermittent chest x-ray and ABG.   SAT/SBT as tolerated, mentation preclude extubation  Ensure adequate pulmonary hygiene  Follow cultures  VAP bundle in place  PAD protocol Hold home CellCept Continue empiric ceftriaxone   SIRS with fever Immune suppressed due to cellcept since April 2025  P: Empiric ceftriaxone  as above Trend CBC and fever curve Hold CellCept on hold  Therapeutic Hypernatermia  - Sodium supratherapeutic at 162 today P: 3% stopped Trend sodium levels  Anemia of critical illness P: Trend CBC Transfuse per protocol Hemoglobin goal greater than 7  Hyperglycemia - Unknown history of diabetes with last hemoglobin A1c October 2024 was 5.7 P: Continue moderate scale SSI CBG goal 140-180 CBG checks every 4  Hypertension Hyperlipidemia -Appears patient is on no medications at baseline for hypertension P:  Norvasc , carvedilol , hydralazine , losartan  started during admission, continue SBP goal less than 160 Continuous telemetry  Best Practice (right click and Reselect all SmartList Selections daily)   Diet/type: NPO. Plan for cotrak and initiation of tube feeds 8/4.  DVT prophylaxis SCD Pressure ulcer(s): N/A GI prophylaxis: PPI Lines: N/A Foley:  Yes, and it is still needed Code Status:  full code Last date of multidisciplinary goals of care discussion: wife and mother updated at bedside. Discussed overview of course of prolonged critical illness including feeding tube, probable tracheostomy, LTACH in best case scenario.   8/5 - mom and daughter Desirae updated at  bedside  CRITICAL CARE Performed by: Baeleigh Devincent D. Harris   Total critical care time: 40 minutes  Critical care time was exclusive of separately billable procedures and treating other patients.  Critical care was necessary to treat or prevent imminent or life-threatening deterioration.  Critical care was time spent personally by me on the following activities: development of treatment plan with patient and/or surrogate as well as nursing, discussions with consultants, evaluation of patient's response to treatment, examination of patient, obtaining history from patient or surrogate, ordering and performing treatments and interventions, ordering and review of laboratory studies, ordering and review of radiographic studies, pulse oximetry and re-evaluation of patient's condition.  Danahi Reddish D. Harris, NP-C  Pulmonary & Critical Care Personal contact information can be found on Amion  If no contact or response made please call [phone] 02/19/2024, 7:21 AM

## 2024-02-20 DIAGNOSIS — J9601 Acute respiratory failure with hypoxia: Secondary | ICD-10-CM | POA: Diagnosis not present

## 2024-02-20 DIAGNOSIS — R6510 Systemic inflammatory response syndrome (SIRS) of non-infectious origin without acute organ dysfunction: Secondary | ICD-10-CM | POA: Diagnosis not present

## 2024-02-20 DIAGNOSIS — I619 Nontraumatic intracerebral hemorrhage, unspecified: Secondary | ICD-10-CM | POA: Diagnosis not present

## 2024-02-20 DIAGNOSIS — J9602 Acute respiratory failure with hypercapnia: Secondary | ICD-10-CM | POA: Diagnosis not present

## 2024-02-20 LAB — CBC
HCT: 31.5 % — ABNORMAL LOW (ref 36.0–46.0)
Hemoglobin: 8.9 g/dL — ABNORMAL LOW (ref 12.0–15.0)
MCH: 28.7 pg (ref 26.0–34.0)
MCHC: 28.3 g/dL — ABNORMAL LOW (ref 30.0–36.0)
MCV: 101.6 fL — ABNORMAL HIGH (ref 80.0–100.0)
Platelets: 337 K/uL (ref 150–400)
RBC: 3.1 MIL/uL — ABNORMAL LOW (ref 3.87–5.11)
RDW: 14.2 % (ref 11.5–15.5)
WBC: 10.8 K/uL — ABNORMAL HIGH (ref 4.0–10.5)
nRBC: 0.2 % (ref 0.0–0.2)

## 2024-02-20 LAB — SODIUM
Sodium: 158 mmol/L — ABNORMAL HIGH (ref 135–145)
Sodium: 159 mmol/L — ABNORMAL HIGH (ref 135–145)

## 2024-02-20 LAB — GLUCOSE, CAPILLARY
Glucose-Capillary: 124 mg/dL — ABNORMAL HIGH (ref 70–99)
Glucose-Capillary: 124 mg/dL — ABNORMAL HIGH (ref 70–99)
Glucose-Capillary: 132 mg/dL — ABNORMAL HIGH (ref 70–99)
Glucose-Capillary: 134 mg/dL — ABNORMAL HIGH (ref 70–99)
Glucose-Capillary: 142 mg/dL — ABNORMAL HIGH (ref 70–99)
Glucose-Capillary: 94 mg/dL (ref 70–99)

## 2024-02-20 LAB — CULTURE, BLOOD (ROUTINE X 2)
Culture: NO GROWTH
Culture: NO GROWTH
Special Requests: ADEQUATE

## 2024-02-20 LAB — BASIC METABOLIC PANEL WITH GFR
Anion gap: 7 (ref 5–15)
BUN: 26 mg/dL — ABNORMAL HIGH (ref 6–20)
CO2: 32 mmol/L (ref 22–32)
Calcium: 8.8 mg/dL — ABNORMAL LOW (ref 8.9–10.3)
Chloride: 119 mmol/L — ABNORMAL HIGH (ref 98–111)
Creatinine, Ser: 0.92 mg/dL (ref 0.44–1.00)
GFR, Estimated: >60 mL/min (ref >60)
Glucose, Bld: 147 mg/dL — ABNORMAL HIGH (ref 70–99)
Potassium: 3.9 mmol/L (ref 3.5–5.1)
Sodium: 158 mmol/L — ABNORMAL HIGH (ref 135–145)

## 2024-02-20 LAB — PHOSPHORUS: Phosphorus: 3.1 mg/dL (ref 2.5–4.6)

## 2024-02-20 LAB — MAGNESIUM: Magnesium: 2.5 mg/dL — ABNORMAL HIGH (ref 1.7–2.4)

## 2024-02-20 MED ORDER — ARFORMOTEROL TARTRATE 15 MCG/2ML IN NEBU
15.0000 ug | INHALATION_SOLUTION | Freq: Two times a day (BID) | RESPIRATORY_TRACT | Status: DC
  Administered 2024-02-20 – 2024-03-04 (×33): 15 ug via RESPIRATORY_TRACT
  Filled 2024-02-20 (×28): qty 2

## 2024-02-20 MED ORDER — ALBUTEROL SULFATE (2.5 MG/3ML) 0.083% IN NEBU
2.5000 mg | INHALATION_SOLUTION | Freq: Four times a day (QID) | RESPIRATORY_TRACT | Status: DC | PRN
  Administered 2024-02-25 – 2024-03-03 (×3): 2.5 mg via RESPIRATORY_TRACT
  Filled 2024-02-20 (×2): qty 3

## 2024-02-20 MED ORDER — BUDESONIDE 0.25 MG/2ML IN SUSP
0.2500 mg | Freq: Two times a day (BID) | RESPIRATORY_TRACT | Status: DC
  Administered 2024-02-20 – 2024-03-04 (×33): 0.25 mg via RESPIRATORY_TRACT
  Filled 2024-02-20 (×27): qty 2

## 2024-02-20 MED ORDER — REVEFENACIN 175 MCG/3ML IN SOLN
175.0000 ug | Freq: Every day | RESPIRATORY_TRACT | Status: DC
  Administered 2024-02-20 – 2024-03-04 (×17): 175 ug via RESPIRATORY_TRACT
  Filled 2024-02-20 (×14): qty 3

## 2024-02-20 MED ORDER — BISACODYL 10 MG RE SUPP
10.0000 mg | Freq: Once | RECTAL | Status: AC
  Administered 2024-02-20: 10 mg via RECTAL
  Filled 2024-02-20: qty 1

## 2024-02-20 NOTE — Progress Notes (Addendum)
 STROKE TEAM PROGRESS NOTE   SUBJECTIVE (INTERIM HISTORY) Wife is at bedside. Patient has disconjugate pupils, R pupil midline and L lateral. Responsiveness same as yesterday. Fever and WBC count is better. We will plan to consult general surgery on possible tracheostomy.    OBJECTIVE Vitals:   02/20/24 0828 02/20/24 0834 02/20/24 0900 02/20/24 1000  BP:   135/73 133/68  Pulse:   91 96  Resp:   20 (!) 22  Temp:      TempSrc:      SpO2: 99% 99% 99% 99%  Weight:      Height:       CBC    Component Value Date/Time   WBC 10.8 (H) 02/20/2024 0427   RBC 3.10 (L) 02/20/2024 0427   HGB 8.9 (L) 02/20/2024 0427   HCT 31.5 (L) 02/20/2024 0427   PLT 337 02/20/2024 0427   MCV 101.6 (H) 02/20/2024 0427   MCH 28.7 02/20/2024 0427   MCHC 28.3 (L) 02/20/2024 0427   RDW 14.2 02/20/2024 0427   LYMPHSABS 1.0 02/15/2024 0659   MONOABS 0.6 02/15/2024 0659   EOSABS 0.0 02/15/2024 0659   BASOSABS 0.0 02/15/2024 0659    BMET    Component Value Date/Time   NA 158 (H) 02/20/2024 0427   NA 137 05/07/2023 0845   K 3.9 02/20/2024 0427   CL 119 (H) 02/20/2024 0427   CO2 32 02/20/2024 0427   GLUCOSE 147 (H) 02/20/2024 0427   BUN 26 (H) 02/20/2024 0427   BUN 15 05/07/2023 0845   CREATININE 0.92 02/20/2024 0427   CREATININE 0.74 08/19/2023 1015   CALCIUM 8.8 (L) 02/20/2024 0427   EGFR 102 08/19/2023 1015   EGFR 91 05/07/2023 0845   GFRNONAA >60 02/20/2024 0427    IMAGING past 24 hours No results found.  Vitals:   02/20/24 0828 02/20/24 0834 02/20/24 0900 02/20/24 1000  BP:   135/73 133/68  Pulse:   91 96  Resp:   20 (!) 22  Temp:      TempSrc:      SpO2: 99% 99% 99% 99%  Weight:      Height:         PHYSICAL EXAM General:  Well-nourished, well-developed, intubated no sedation CV: Regular rhythm with tachycardia Respiratory:  Regular, unlabored respirations on room air     NEURO: Intubated off sedation on vent, rest with eyes half open and attempted to open eyes more with  voice, but still not able to follow commands, eyes disconjugate with R pupil midline and L pupil lateral, positive gag, and right arm and leg withdraw more than left, same reactivity as yesterday.      ASSESSMENT/PLAN [name] is a 48 y.o. female with history of interstitial lung disease on CellCept, hypertension, hyperlipidemia, asthma admitted for headache, nausea vomiting and altered mental status. No TNK given due to [facility]. Repeat CT yesterday stable, will repeat CT again tomorrow. Continue antibiotics. Continue tube feeding. Off 3%. Sodium continuing to decrease 161 -- 160 on free water . WBC continues to improve on Rocephin .      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 right MCA moyamoya syndrome CT head right temporal large ICH with SDH, possible pseudo SAH, Duret hemorrhage with cerebral edema CT head and neck possible spot sign, right MCA occlusion versus vasospasm versus moyamoya syndrome Status post hematoma evacuation CT head substantial evacuation of right temporal lobe hematoma, decreased right SDH, basilar cistern predominant SAH and  decreased mass effect, residual leftward midline shift 4 to 6 mm, stable volume new IVH without ventriculomegaly, right temporal lobe surgical vascular clips in place MRI brain Right temporal lobe intraparenchymal hemorrhage stable. 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  Nearly occluded right A1 branch.  Occluded proximal M1 branch with reconstitution of M2 branches in the sylvian fissure via collaterals 2D Echo EF > 75% LDL 74 HgbA1c 5.3 UDS negative Heparin  subcu for VTE prophylaxis No antithrombotic prior to  admission, now on No antithrombotic due to ICH Ongoing aggressive stroke risk factor management Therapy recommendations: Pending Disposition: Pending   Cerebral edema Brain herniation CT head 8/2 showed intracranial mass effect with widespread cerebral edema, right uncal herniation, trapped left lateral ventricle, effaced basilar cisterns, possible Duret hemorrhage in the right brainstem Status post hematoma evacuation with Dr. Lanis CT head 8/3 substantial evacuation of right temporal lobe hematoma, decreased right SDH, basilar cistern predominant SAH and decreased mass effect, residual leftward midline shift 4 to 6 mm, stable volume new IVH without ventriculomegaly, right temporal lobe surgical vascular clips in place CT 8/4 Similar parenchymal hemorrhage in the right temporal lobe with surrounding edema and local mass effect, resulting in approximately 5 mm leftward midline shift (previously 6 mm). CT 8/6 stable right temporal lobe hemorrhage with expected evolution of surrounding vasogenic edema. Stable edematous changes and stable partial effacement of right lateral ventricle. No hydrocephalus.  Status post 23.4% bolus 8/2 Now on 3% saline @ 75 -> 50-> off Na Q6 with goal 150-155 Na 137--141--140--149-155-159--165--161--160 Continue free water  100 cc every 4 hours   Respiratory failure Intubated on sedation Vent management per CCM Still on propofol , off fentanyl  May need tracheostomy   ? Mycotic aneurysm Interstitial lung disease on CellCept Fever  S/p Right temporal craniotomy for evacuation of hematoma with clipping of distal RMCA aneurysm  Concerning for mycotic aneurysm, especially that pt is on cellcept with ILD Blood culture NGTD Tmax 101-100.5--100--100.7 Tachycardia improved Leukocytosis WBC 19.4--16.5--12.1 -- 10.8 On Rocephin    Hypertension Mildly hypertensive, or Norvasc  10 and losartan  100 Off Cleviprex    On amlodipine  10, hydralazine  50 Q6h, losartan  100  daily, coreg  12.5 bid BP goal less than 160 Long-term BP goal normotensive   Hyperlipidemia Home meds: None LDL 74, goal < 70 Consider low dose statin at discharge   Dysphagia NPO S/p cortrak On tube feeding at 50cc , Free water  100 cc every 4h   Other Stroke Risk Factors Obesity, Body mass index is 37.97 kg/m.    Other Active Problems Asthma Tachycardia on coreg , improved  Hospital day # 5  Alan Maiden. MD PGY-1  ATTENDING NOTE: I reviewed above note and agree with the assessment and plan. Pt was seen and examined.   Wife at bedside.  Patient still intubated, hardly open eyes on stimulation, weak corneal, positive gag and cough, PERRL.  Mild withdraw at right upper and lower extremity, minimal withdrawal on the left lower extremity.  Neuro no significant change from yesterday.  Likely need tracheostomy.  Still has low-grade fever, leukocytosis improved.  Continue current management.  For detailed assessment and plan, please refer to above as I have made changes wherever appropriate.   Ary Cummins, MD PhD Stroke Neurology 02/20/2024 7:14 PM  This patient is critically ill due to large ICH and SDH status post hematoma evacuation, respiratory failure, cerebral edema and at significant risk of neurological worsening, death form brain herniation, brain death. This patient's care requires constant monitoring of vital signs, hemodynamics, respiratory and cardiac monitoring, review of multiple databases, neurological assessment, discussion with family, other specialists and medical decision making of high complexity. I spent 35 minutes of neurocritical care time in the care of this patient. I had long discussion with her wife at bedside, updated pt current condition, treatment plan and potential prognosis, and answered all the questions. She expressed understanding and appreciation.   To contact Stroke Continuity provider, please refer to [email or web address]. After hours, contact General  Neurology

## 2024-02-20 NOTE — Progress Notes (Signed)
 Nutrition Follow-up  DOCUMENTATION CODES:   Obesity unspecified  INTERVENTION:   Tube feeding via cortrak tube: Osmolite 1.5 at 50 ml/h (1200 ml per day)  Prosource TF20 60 ml BID  Provides 1960 kcal, 115 gm protein, 912 ml free water  daily  MVI with minerals daily   100 ml free water  every 4 hours Total free water : 1512 ml   NUTRITION DIAGNOSIS:   Inadequate oral intake related to inability to eat as evidenced by NPO status. Ongoing.   GOAL:   Patient will meet greater than or equal to 90% of their needs Met with TF at goal rate   MONITOR:   TF tolerance, Diet advancement  REASON FOR ASSESSMENT:   Consult Enteral/tube feeding initiation and management  ASSESSMENT:   Pt with PMH of Interstitial Lung Disease with Usual Interstitial Pneumonia treated with CellCept as of 10/2023, HTN, HLD, osteoporosis, vitamin D  deficiency and asthma admitted with large IPH.   Pt discussed during ICU rounds and with RN and MD.  Pt remains intubated, concern for moya moya. Per Neuro mental status is improving but not adequate for extubation. Per CCM may need trach. Spoke with wife and daughter.  No bm yet.   8/2 - s/p R temporal craniotomy, clipping of distal R MCA aneurysm  8/4 - s/p cortrak placement; tip in duodenum per xray however suspect this is inaccurate since pt has hx of heterotaxy   Medications reviewed and include: pepcid , SSI every 4 hours, MVI with minerals, miralax , senokot-s  Propofol  @ 6 ml/hr provides: 158 kcal  Hypertonic saline off  Labs reviewed:  Na 158 Phos 3.0 -> 2.6 -> 3.1 Mag 2.0 -> 2.0 ->2.5 A1C 5.3 CBG's: 124-142     Diet Order:   Diet Order             Diet NPO time specified  Diet effective now                   EDUCATION NEEDS:   Not appropriate for education at this time  Skin:  Skin Assessment: Reviewed RN Assessment (R head incision)  Last BM:  unknown  Height:   Ht Readings from Last 1 Encounters:  02/15/24 5'  7.01 (1.702 m)    Weight:   Wt Readings from Last 1 Encounters:  02/18/24 110 kg    BMI:  Body mass index is 37.97 kg/m.  Estimated Nutritional Needs:   Kcal:  1900-2100  Protein:  105-120 grams  Fluid:  >1.9L/day  Yechezkel Fertig P., RD, LDN, CNSC See AMiON for contact information

## 2024-02-20 NOTE — Progress Notes (Signed)
 NAME:  Katie Knight, MRN:  [id number], DOB:  10-Sep-1975, LOS: 5 ADMISSION DATE:  02/15/2024, CONSULTATION DATE:  02/15/2024 REFERRING MD:  Dr. 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  Stroke code stroke CTA showed large intraparenchymal hemorrhage on the right side with some subdural subarachnoid hemorrhage as well.  Neurosurgery and neurology consulted for further management.  PCCM consulted for admission.  On ED arrival patient was seen mildly hypothermic, bradycardic, and hypertensive.  Lab work significant for potassium 3.3, glucose 216, hemoglobin 11 point.   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 Taken for crani and evacuation. Clipping of distal RMCA aneurysm  8/3 - Agitated overnight. BP remains at upper limit of goal.2L positive.Tmax 100.1. Tolerating SBT 8/6 repeat head CT with stable right temporal lobe hemorrhage with expected evolution of surrounding vasogenic edema and stable edematous changes and probable infarct in the posterior limb of the internal capsule 8/7 no acute issues overnight  Interim History / Subjective:  Remains on ventilator with low-dose  propofol  Family at bedside and updated  Objective    Blood pressure (!) 147/76, pulse (!) 113, temperature 99.7 F (37.6 C), temperature source Axillary, resp. rate (!) 28, height 5' 7.01 (1.702 m), weight 110 kg, SpO2 99%.    Vent Mode: PRVC FiO2 (%):  [40 %] 40 % Set Rate:  [20 bmp] 20 bmp Vt Set:  [500 mL] 500 mL PEEP:  [5 cmH20] 5 cmH20 Pressure Support:  [8 cmH20] 8 cmH20 Plateau Pressure:  [17 cmH20-20 cmH20] 20 cmH20   Intake/Output Summary (Last 24 hours) at 02/20/2024 0942 Last data filed at 02/20/2024 0800 Gross per 24 hour  Intake 1917.62 ml  Output 3300 ml  Net -1382.38 ml   Filed Weights   02/15/24 0647 02/17/24 0722 02/18/24 0723  Weight: 110 kg 110 kg 110 kg    Examination:  General: Acute ill-appearing adult female lying in bed on mechanical ventilation in no acute distress HEENT: ETT, MM pink/moist, PERRL,  Neuro: Moves extremities to pain, lightly sedated on ventilator unable to open eyes CV: s1s2 regular rate and rhythm, no murmur, rubs, or gallops,  PULM: Clear to auscultation bilaterally, no increased work of breathing, no added breath sounds GI: soft, bowel sounds active in all 4 quadrants, non-tender, non-distended, tolerating TF Extremities: warm/dry, no edema  Skin: no rashes or lesions  Assessment and Plan  Acute intraparenchymal hemorrhage s/p craniotomy and hematoma evacuation 8/2 with RMCA aneurysm clipping - 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 Cerebral edema with brain herniation Concern for underlying mycotic aneurysm in the setting of immunosuppression P: Management per neurology  Maintain neuro protective measures Nutrition and bowel regiment  Seizure precautions  AEDs per neurology  Aspirations precautions  Frequent neurochecks  Repeat imaging per neurosurgery   Acute hypoxic and hypercapnic respiratory failure secondary  to above 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 Head of bed elevated 30 degrees. Plateau pressures less than 30 cm H20.  Follow intermittent chest x-ray and ABG.   SAT/SBT as tolerated, mentation preclude extubation  Ensure adequate pulmonary hygiene  Follow cultures  VAP bundle in place  PAD protocol CellCept remains on hold  Empiric Ceftriaxone    SIRS with fever Immune suppressed due to cellcept since April 2025  P: Ceftriaxone  as above  Trend CBC and fever curve  CellCept on hold   Therapeutic Hypernatermia  - Sodium supratherapeutic at 162 today P: Trend sodium levels  3% currently stopped   Anemia of critical illness P: Trend CBC  Transfuse per protocol  Hgb goal > 7  Hyperglycemia - Unknown history of diabetes with last hemoglobin A1c October 2024 was 5.7 P: Continue moderate scale SSI  CBG goal 140-180 CBG checks every 4  Hypertension Hyperlipidemia -Appears patient is on no medications at baseline for hypertension P:  Continue Carvedilol , Hydralazine , and Losartan   SBP goal less than 160 Continue telemetry   Best Practice (right click and Reselect all SmartList Selections daily)   Diet/type: NPO. Plan for cotrak and initiation of tube feeds 8/4.  DVT prophylaxis SCD Pressure ulcer(s): N/A GI prophylaxis: PPI Lines: N/A Foley:  Yes, and it is still needed Code Status:  full code Last date of multidisciplinary goals of care discussion: wife and mother updated at bedside. Discussed overview of course of prolonged critical illness including feeding tube, probable tracheostomy, LTACH in best case scenario.   8/5 - mom and daughter Desirae updated at bedside  CRITICAL CARE Performed by: Eldean Nanna D. Harris   Total critical care time: 40 minutes  Critical care time was exclusive of  separately billable procedures and treating other patients.  Critical care was necessary to treat or prevent imminent or life-threatening deterioration.  Critical care was time spent personally by me on the following activities: development of treatment plan with patient and/or surrogate as well as nursing, discussions with consultants, evaluation of patient's response to treatment, examination of patient, obtaining history from patient or surrogate, ordering and performing treatments and interventions, ordering and review of laboratory studies, ordering and review of radiographic studies, pulse oximetry and re-evaluation of patient's condition.  Merrick Maggio D. Harris, NP-C [facility] Personal contact information can be found on Amion  If no contact or response made please call [phone] 02/20/2024, 9:42 AM

## 2024-02-21 DIAGNOSIS — J9602 Acute respiratory failure with hypercapnia: Secondary | ICD-10-CM | POA: Diagnosis not present

## 2024-02-21 DIAGNOSIS — E870 Hyperosmolality and hypernatremia: Secondary | ICD-10-CM

## 2024-02-21 DIAGNOSIS — R6511 Systemic inflammatory response syndrome (SIRS) of non-infectious origin with acute organ dysfunction: Secondary | ICD-10-CM | POA: Diagnosis not present

## 2024-02-21 DIAGNOSIS — J9601 Acute respiratory failure with hypoxia: Secondary | ICD-10-CM | POA: Diagnosis not present

## 2024-02-21 DIAGNOSIS — I619 Nontraumatic intracerebral hemorrhage, unspecified: Secondary | ICD-10-CM | POA: Diagnosis not present

## 2024-02-21 LAB — MAGNESIUM: Magnesium: 2.7 mg/dL — ABNORMAL HIGH (ref 1.7–2.4)

## 2024-02-21 LAB — GLUCOSE, CAPILLARY
Glucose-Capillary: 119 mg/dL — ABNORMAL HIGH (ref 70–99)
Glucose-Capillary: 140 mg/dL — ABNORMAL HIGH (ref 70–99)
Glucose-Capillary: 125 mg/dL — ABNORMAL HIGH (ref 70–99)
Glucose-Capillary: 105 mg/dL — ABNORMAL HIGH (ref 70–99)
Glucose-Capillary: 112 mg/dL — ABNORMAL HIGH (ref 70–99)
Glucose-Capillary: 120 mg/dL — ABNORMAL HIGH (ref 70–99)

## 2024-02-21 LAB — CULTURE, RESPIRATORY W GRAM STAIN

## 2024-02-21 LAB — SODIUM
Sodium: 164 mmol/L (ref 135–145)
Sodium: 160 mmol/L — ABNORMAL HIGH (ref 135–145)

## 2024-02-21 LAB — BASIC METABOLIC PANEL WITH GFR
Anion gap: 13 (ref 5–15)
BUN: 31 mg/dL — ABNORMAL HIGH (ref 6–20)
CO2: 30 mmol/L (ref 22–32)
Calcium: 8.8 mg/dL — ABNORMAL LOW (ref 8.9–10.3)
Chloride: 120 mmol/L — ABNORMAL HIGH (ref 98–111)
Creatinine, Ser: 1.06 mg/dL — ABNORMAL HIGH (ref 0.44–1.00)
GFR, Estimated: >60 mL/min (ref >60)
Glucose, Bld: 145 mg/dL — ABNORMAL HIGH (ref 70–99)
Potassium: 4 mmol/L (ref 3.5–5.1)
Sodium: 163 mmol/L (ref 135–145)

## 2024-02-21 LAB — CBC
HCT: 30 % — ABNORMAL LOW (ref 36.0–46.0)
Hemoglobin: 8.6 g/dL — ABNORMAL LOW (ref 12.0–15.0)
MCH: 29 pg (ref 26.0–34.0)
MCHC: 28.7 g/dL — ABNORMAL LOW (ref 30.0–36.0)
MCV: 101 fL — ABNORMAL HIGH (ref 80.0–100.0)
Platelets: 347 K/uL (ref 150–400)
RBC: 2.97 MIL/uL — ABNORMAL LOW (ref 3.87–5.11)
RDW: 14.1 % (ref 11.5–15.5)
WBC: 10.9 K/uL — ABNORMAL HIGH (ref 4.0–10.5)
nRBC: 0 % (ref 0.0–0.2)

## 2024-02-21 LAB — PROCALCITONIN: Procalcitonin: <0.1 ng/mL

## 2024-02-21 LAB — HEPATIC FUNCTION PANEL
ALT: 55 U/L — ABNORMAL HIGH (ref 0–44)
AST: 59 U/L — ABNORMAL HIGH (ref 15–41)
Albumin: 2.3 g/dL — ABNORMAL LOW (ref 3.5–5.0)
Alkaline Phosphatase: 76 U/L (ref 38–126)
Bilirubin, Direct: <0.1 mg/dL (ref 0.0–0.2)
Total Bilirubin: 0.5 mg/dL (ref 0.0–1.2)
Total Protein: 6.7 g/dL (ref 6.5–8.1)

## 2024-02-21 LAB — PHOSPHORUS: Phosphorus: 3.4 mg/dL (ref 2.5–4.6)

## 2024-02-21 MED ORDER — SMOG ENEMA
960.0000 mL | Freq: Once | RECTAL | Status: DC
  Filled 2024-02-21: qty 960

## 2024-02-21 MED ORDER — FREE WATER
200.0000 mL | Status: DC
  Administered 2024-02-21 – 2024-02-22 (×6): 200 mL

## 2024-02-21 MED ORDER — CHLORHEXIDINE GLUCONATE CLOTH 2 % EX PADS
6.0000 | MEDICATED_PAD | Freq: Every day | CUTANEOUS | Status: DC
  Administered 2024-02-22 – 2024-03-04 (×15): 6 via TOPICAL

## 2024-02-21 NOTE — Progress Notes (Signed)
 NAME:  Katie Knight, MRN:  [id number], DOB:  07-07-1976, LOS: 6 ADMISSION DATE:  02/15/2024, CONSULTATION DATE:  02/15/2024 REFERRING MD:  Dr. 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  Stroke code stroke CTA showed large intraparenchymal hemorrhage on the right side with some subdural subarachnoid hemorrhage as well.  Neurosurgery and neurology consulted for further management.  PCCM consulted for admission.  On ED arrival patient was seen mildly hypothermic, bradycardic, and hypertensive.  Lab work significant for potassium 3.3, glucose 216, hemoglobin 11 point.   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 Taken for crani and evacuation. Clipping of distal RMCA aneurysm  8/3 - Agitated overnight. BP remains at upper limit of goal.2L positive.Tmax 100.1. Tolerating SBT 8/6 repeat head CT with stable right temporal lobe hemorrhage with expected evolution of surrounding vasogenic edema and stable edematous changes and probable infarct in the posterior limb of the internal capsule 8/7 no acute issues overnight  Interim History / Subjective:  0.40, PEEP 5 Propofol  10 I/O+ 800 cc total Na  164 > 163; off 3% saline  Objective    Blood pressure (!) 114/58, pulse (!) 116, temperature 100.2 F (37.9 C), temperature source Axillary, resp. rate 20, height 5' 7.01 (1.702 m), weight 110 kg, SpO2 98%.    Vent Mode: PRVC FiO2 (%):  [40 %] 40 % Set Rate:  [20 bmp] 20 bmp Vt Set:  [500 mL] 500 mL PEEP:  [5 cmH20] 5 cmH20 Pressure Support:  [10 cmH20] 10 cmH20 Plateau Pressure:  [18 cmH20-20 cmH20] 19 cmH20   Intake/Output Summary (Last 24 hours) at 02/21/2024 0907 Last data filed at 02/21/2024 0746 Gross per 24 hour  Intake 1893.85 ml  Output 2200 ml  Net -306.15 ml   Filed Weights   02/15/24 0647 02/17/24 0722 02/18/24 0723  Weight: 110 kg 110 kg 110 kg    Examination:  General: Obese ill-appearing woman, ventilated and poorly responsive HEENT: ET tube in position, oropharynx moist Neuro: Disconjugate gaze, right pupil slightly larger and sluggish, left pupil brisk.  Flexor posturing right upper extremity.  Did not move the left.  Toes upgoing.  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  Will need  tracheostomy if we are to push forward - Sedation as per PAD protocol, minimizing - Pulmonary hygiene - VAP prevention orders - CellCept on hold in the setting of acute illness, infection. -Complete 7-day course of empiric ceftriaxone , follow cultures, fever curve, WBC  SIRS with fever Immune suppressed due to cellcept since April 2025  P: -Empiric 7-day course ceftriaxone  - Following fever curve, CBC - CellCept on hold  Therapeutic Hypernatermia -163 P: -Slowly increase free water  to avoid rapid shifts, increased on 8/8 -Off 3% saline -Follow sodium every 12 hours  Anemia of critical illness P: - Follow CBC - Hemoglobin goal 7.0   Hyperglycemia - Unknown history of diabetes with last hemoglobin A1c October 2024 was 5.7 P: -Continue moderate SSI protocol   Hypertension Hyperlipidemia -Appears patient is on no medications at baseline for hypertension P:  -Carvedilol , hydralazine , losartan  - SBP goal <160  Constipation P: -Senna, Colace, MiraLAX , Dulcolax suppository - Try smog enema on 8/8 if above ineffective   Best Practice (right click and Reselect all SmartList Selections daily)   Diet/type: tubefeeds.  DVT prophylaxis SCD Pressure ulcer(s): N/A GI prophylaxis: PPI Lines: N/A Foley:  Yes, and it is still needed Code Status:  full code Last date of multidisciplinary goals of care discussion: wife and mother updated at bedside. Discussed overview of course of prolonged critical illness including feeding tube, probable tracheostomy, LTACH in best case scenario.   8/8 --updated wife and mother at bedside regarding active issues, prognosis, fact that she will require tracheostomy if we are to push forward to follow for any evidence of neurological recovery.  CRITICAL CARE  Total critical care time: 35 minutes  Critical care time was exclusive of separately billable procedures and treating other patients.  Critical care was necessary to treat or prevent  imminent or life-threatening deterioration.  Critical care was time spent personally by me on the following activities: development of treatment plan with patient and/or surrogate as well as nursing, discussions with consultants, evaluation of patient's response to treatment, examination of patient, obtaining history from patient or surrogate, ordering and performing treatments and interventions, ordering and review of laboratory studies, ordering and review of radiographic studies, pulse oximetry and re-evaluation of patient's condition.   Lamar Chris, MD, PhD 02/21/2024, 9:07 AM [facility] [phone] or if no answer before 7:00PM call [phone] For any issues after 7:00PM please call eLink [phone]

## 2024-02-21 NOTE — Progress Notes (Addendum)
 STROKE TEAM PROGRESS NOTE   SUBJECTIVE (INTERIM HISTORY)  Wife, daughter, mother, and father is at bedside. Pupils are no longer disconjugate, both now midline. Responsiveness same as yesterday. Fever and WBC count is better. Critical Care consulted on trach and agreed to proceed with trach next week after discussion about prognosis. Family was consulted about patient's prognosis and planned trach.    OBJECTIVE Vitals:   02/21/24 0749 02/21/24 0751 02/21/24 0753 02/21/24 0754  BP:      Pulse: (!) 116     Resp: 20     Temp: 100.2 F (37.9 C)     TempSrc: Axillary     SpO2: 98% 98% 98% 98%  Weight:      Height:        CBC    Component Value Date/Time   WBC 10.9 (H) 02/21/2024 0510   RBC 2.97 (L) 02/21/2024 0510   HGB 8.6 (L) 02/21/2024 0510   HCT 30.0 (L) 02/21/2024 0510   PLT 347 02/21/2024 0510   MCV 101.0 (H) 02/21/2024 0510   MCH 29.0 02/21/2024 0510   MCHC 28.7 (L) 02/21/2024 0510   RDW 14.1 02/21/2024 0510   LYMPHSABS 1.0 02/15/2024 0659   MONOABS 0.6 02/15/2024 0659   EOSABS 0.0 02/15/2024 0659   BASOSABS 0.0 02/15/2024 0659    BMET    Component Value Date/Time   NA 163 (HH) 02/21/2024 0510   NA 137 05/07/2023 0845   K 4.0 02/21/2024 0510   CL 120 (H) 02/21/2024 0510   CO2 30 02/21/2024 0510   GLUCOSE 145 (H) 02/21/2024 0510   BUN 31 (H) 02/21/2024 0510   BUN 15 05/07/2023 0845   CREATININE 1.06 (H) 02/21/2024 0510   CREATININE 0.74 08/19/2023 1015   CALCIUM 8.8 (L) 02/21/2024 0510   EGFR 102 08/19/2023 1015   EGFR 91 05/07/2023 0845   GFRNONAA >60 02/21/2024 0510    IMAGING past 24 hours No results found.   PHYSICAL EXAM General:  Well-nourished, well-developed patient in no acute distress, intubated CV: Regular rate and rhythm on monitor Respiratory:  Regular, unlabored respirations on vent    NEURO:  Patient still intubated, eyes halfway open on stimulation, midline 2mm pupils, weak corneal reflex, positive gag and cough, PEERL. Mild  withdraw at RUE and RLE, with minimal withdrawal on LUE and LLE. No signifigant change in neurological exam from yesterday  Most Recent NIH  Dizziness Present: No (08/08 0600) Headache Present: No (08/08 0600) Interval: Shift assessment (08/08 0600) Level of Consciousness (1a.)   : Not alert, requires repeated stimulation to attend, or is obtunded and requires strong or painful stimulation to make movements (not stereotyped) (08/08 0600) LOC Questions (1b. )   : Answers neither question correctly (08/08 0600) LOC Commands (1c. )   : Performs neither task correctly (08/08 0600) Best Gaze (2. )  : Normal (08/08 0600) Visual (3. )  : Bilateral hemianopia (blind including cortical blindness) (08/08 0600) Facial Palsy (4. )    : Partial paralysis  (08/08 0600) Motor Arm, Left (5a. )   : No effort against gravity (08/08 0600) Motor Arm, Right (5b. ) : Some effort against gravity (08/08 0600) Motor Leg, Left (6a. )  : No effort against gravity (08/08 0600) Motor Leg, Right (6b. ) : No effort against gravity (08/08 0600) Limb Ataxia (7. ): Absent (08/08 0600) Sensory (8. )  : Severe to total sensory loss, patient is not aware of being touched in the face, arm, and leg (08/08  0600) Best Language (9. )  : Mute, global aphasia (08/08 0600) Dysarthria (10. ): Intubated or other physical barrier (08/08 0600) Extinction/Inattention (11.)   : Profound hemi-inattention or extinction to more than one modality (08/08 0600) Complete NIHSS TOTAL: 29 (08/08 0600)     ASSESSMENT/PLAN Katie Knight is a 48 y.o. female with history of interstitial lung disease on CellCept, hypertension, hyperlipidemia, asthma admitted for headache, nausea vomiting and altered mental status. No TNK given due to [facility]. WBC continues to improve on Rocephin . Continues to have low grade fever. Sodium has increased form 160 to 163, free water  increased from 100 mL to 200 mL every 4 hours. Continue current management and restart 3%  once Na decreases below 153. Will plan to get another CT tomorrow.      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 right MCA moyamoya syndrome CT head right temporal large ICH with SDH, possible pseudo SAH, Duret hemorrhage with cerebral edema CT head and neck possible spot sign, right MCA occlusion versus vasospasm versus moyamoya syndrome Status post hematoma evacuation CT head substantial evacuation of right temporal lobe hematoma, decreased right SDH, basilar cistern predominant SAH and decreased mass effect, residual leftward midline shift 4 to 6 mm, stable volume new IVH without ventriculomegaly, right temporal lobe surgical vascular clips in place MRI brain Right temporal lobe intraparenchymal hemorrhage stable. 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  Nearly occluded right A1 branch.  Occluded proximal M1 branch with reconstitution of M2 branches in the sylvian fissure via collaterals 2D Echo EF > 75% LDL 74 HgbA1c 5.3 UDS negative Heparin  subcu for VTE prophylaxis No antithrombotic prior to admission, now on No antithrombotic due to ICH Ongoing aggressive stroke risk factor management Therapy recommendations: Pending Disposition: Pending   Cerebral edema Brain herniation CT head 8/2 showed intracranial mass effect with widespread cerebral edema, right uncal herniation, trapped left lateral ventricle, effaced basilar cisterns, possible Duret hemorrhage in the right brainstem Status post hematoma evacuation with Dr. Lanis CT head 8/3 substantial evacuation of right temporal lobe hematoma, decreased right SDH, basilar cistern predominant SAH and  decreased mass effect, residual leftward midline shift 4 to 6 mm, stable volume new IVH without ventriculomegaly, right temporal lobe surgical vascular clips in place CT 8/4 Similar parenchymal hemorrhage in the right temporal lobe with surrounding edema and local mass effect, resulting in approximately 5 mm leftward midline shift (previously 6 mm). CT 8/6 stable right temporal lobe hemorrhage with expected evolution of surrounding vasogenic edema. Stable edematous changes and stable partial effacement of right lateral ventricle. No hydrocephalus.  CT repeat in a.m. Status post 23.4% bolus 8/2 Now on 3% saline @ 75 -> 50-> off Na Q6 with goal 150-155 Na 137--141--140--149-155-159--165--161--160 - 163 Start free water  100->200 cc every 4 hours   Respiratory failure Intubated on sedation Vent management per CCM Still on propofol , off fentanyl  Tracheostomy planned for early next week per Dr. Shelah   ? Mycotic aneurysm Interstitial lung disease on CellCept Fever  S/p Right temporal craniotomy for evacuation of hematoma with clipping of distal RMCA aneurysm  Concerning for mycotic aneurysm, especially that pt is on cellcept with ILD Blood culture NGTD Tmax 101-100.5--100--100.7  Tachycardia improved Leukocytosis WBC 19.4--16.5--12.1 -- 10.8 -- 10.9 On Rocephin    Hypertension Mildly hypertensive, or Norvasc  10 and losartan  100 Off Cleviprex    On amlodipine  10, hydralazine  50 Q6h, losartan  100 daily, coreg  12.5 bid BP goal less than 160 Long-term BP goal normotensive   Hyperlipidemia Home meds: None LDL 74, goal < 70 Consider low dose statin at discharge   Dysphagia NPO S/p cortrak On tube feeding at 50cc Free water  100->200 cc every 4h   Other Stroke Risk Factors Obesity, Body mass index is 37.97 kg/m.    Other Active Problems Asthma Tachycardia on coreg , improved  Hospital day # 6   ATTENDING NOTE: I reviewed above note and agree with the assessment and plan. Pt was  seen and examined.   Wife at bedside.  Patient still minimally responsive, hardly open eyes on pain stimulation, positive corneal and gag, PERRL, doll's eyes present.  Slight movement on the right upper and lower extremity with pain stimulation.  Discussed about tracheostomy early next week with wife and Dr. Shelah.  Sodium high, put him free water .  CT repeat in a.m.  For detailed assessment and plan, please refer to above as I have made changes wherever appropriate.   Ary Cummins, MD PhD Stroke Neurology 02/21/2024 7:49 PM  This patient is critically ill due to large ICH and SDH status post hematoma evacuation, respiratory failure, cerebral edema and at significant risk of neurological worsening, death form brain herniation, brain death. This patient's care requires constant monitoring of vital signs, hemodynamics, respiratory and cardiac monitoring, review of multiple databases, neurological assessment, discussion with family, other specialists and medical decision making of high complexity. I spent 35 minutes of neurocritical care time in the care of this patient. I had long discussion with her wife at bedside, updated pt current condition, treatment plan and potential prognosis, and answered all the questions. She expressed understanding and appreciation.  I discussed with Dr. Shelah CCM.  To contact Stroke Continuity provider, please refer to [email or web address]. After hours, contact General Neurology

## 2024-02-22 ENCOUNTER — Inpatient Hospital Stay (HOSPITAL_COMMUNITY)

## 2024-02-22 DIAGNOSIS — I609 Nontraumatic subarachnoid hemorrhage, unspecified: Secondary | ICD-10-CM | POA: Diagnosis not present

## 2024-02-22 DIAGNOSIS — J9601 Acute respiratory failure with hypoxia: Secondary | ICD-10-CM | POA: Diagnosis not present

## 2024-02-22 DIAGNOSIS — R29726 NIHSS score 26: Secondary | ICD-10-CM

## 2024-02-22 DIAGNOSIS — J9602 Acute respiratory failure with hypercapnia: Secondary | ICD-10-CM | POA: Diagnosis not present

## 2024-02-22 DIAGNOSIS — R6511 Systemic inflammatory response syndrome (SIRS) of non-infectious origin with acute organ dysfunction: Secondary | ICD-10-CM | POA: Diagnosis not present

## 2024-02-22 DIAGNOSIS — I6200 Nontraumatic subdural hemorrhage, unspecified: Secondary | ICD-10-CM | POA: Diagnosis not present

## 2024-02-22 DIAGNOSIS — I611 Nontraumatic intracerebral hemorrhage in hemisphere, cortical: Secondary | ICD-10-CM | POA: Diagnosis not present

## 2024-02-22 DIAGNOSIS — I619 Nontraumatic intracerebral hemorrhage, unspecified: Secondary | ICD-10-CM | POA: Diagnosis not present

## 2024-02-22 LAB — BASIC METABOLIC PANEL WITH GFR
Anion gap: 8 (ref 5–15)
BUN: 33 mg/dL — ABNORMAL HIGH (ref 6–20)
CO2: 28 mmol/L (ref 22–32)
Calcium: 8.6 mg/dL — ABNORMAL LOW (ref 8.9–10.3)
Chloride: 120 mmol/L — ABNORMAL HIGH (ref 98–111)
Creatinine, Ser: 0.95 mg/dL (ref 0.44–1.00)
GFR, Estimated: >60 mL/min (ref >60)
Glucose, Bld: 137 mg/dL — ABNORMAL HIGH (ref 70–99)
Potassium: 4 mmol/L (ref 3.5–5.1)
Sodium: 156 mmol/L — ABNORMAL HIGH (ref 135–145)

## 2024-02-22 LAB — CBC
HCT: 30.1 % — ABNORMAL LOW (ref 36.0–46.0)
Hemoglobin: 8.5 g/dL — ABNORMAL LOW (ref 12.0–15.0)
MCH: 28.7 pg (ref 26.0–34.0)
MCHC: 28.2 g/dL — ABNORMAL LOW (ref 30.0–36.0)
MCV: 101.7 fL — ABNORMAL HIGH (ref 80.0–100.0)
Platelets: 352 K/uL (ref 150–400)
RBC: 2.96 MIL/uL — ABNORMAL LOW (ref 3.87–5.11)
RDW: 14 % (ref 11.5–15.5)
WBC: 11.5 K/uL — ABNORMAL HIGH (ref 4.0–10.5)
nRBC: 0 % (ref 0.0–0.2)

## 2024-02-22 LAB — GLUCOSE, CAPILLARY
Glucose-Capillary: 115 mg/dL — ABNORMAL HIGH (ref 70–99)
Glucose-Capillary: 121 mg/dL — ABNORMAL HIGH (ref 70–99)
Glucose-Capillary: 123 mg/dL — ABNORMAL HIGH (ref 70–99)
Glucose-Capillary: 124 mg/dL — ABNORMAL HIGH (ref 70–99)
Glucose-Capillary: 129 mg/dL — ABNORMAL HIGH (ref 70–99)
Glucose-Capillary: 131 mg/dL — ABNORMAL HIGH (ref 70–99)

## 2024-02-22 LAB — SEDIMENTATION RATE: Sed Rate: >140 mm/h — ABNORMAL HIGH (ref 0–22)

## 2024-02-22 LAB — C-REACTIVE PROTEIN: CRP: 10.9 mg/dL — ABNORMAL HIGH (ref <1.0)

## 2024-02-22 LAB — PHOSPHORUS: Phosphorus: 3.7 mg/dL (ref 2.5–4.6)

## 2024-02-22 LAB — MAGNESIUM: Magnesium: 3 mg/dL — ABNORMAL HIGH (ref 1.7–2.4)

## 2024-02-22 MED ORDER — INSULIN ASPART 100 UNIT/ML IJ SOLN
0.0000 [IU] | Freq: Four times a day (QID) | INTRAMUSCULAR | Status: DC
  Administered 2024-02-22 – 2024-02-23 (×4): 2 [IU] via SUBCUTANEOUS

## 2024-02-22 MED ORDER — FREE WATER
200.0000 mL | Freq: Four times a day (QID) | Status: DC
  Administered 2024-02-22 – 2024-02-23 (×4): 200 mL

## 2024-02-22 NOTE — Progress Notes (Signed)
 Patient transported to CT scan then back to 4N26 on ventilator. VSS throughout, no adverse events noted. Ventilator plugged back into red outlet and patient suctioned upon arrival back to unit.

## 2024-02-22 NOTE — Progress Notes (Signed)
 STROKE TEAM PROGRESS NOTE   SUBJECTIVE (INTERIM HISTORY)  Her daughter is at bedside.  Patient remains intubated for respiratory failure and is unable to be weaned off ventilatory support.  She opens eyes partially to stimulation but is aphasic and not following commands.  He spontaneously moves right upper and lower extremity but has no movement in the left upper extremity.  There is trace withdrawal in the left lower extremity.  Blood pressure adequately controlled. CT head this morning shows stable 4.1 cm right temporal hematoma with cytotoxic edema.  Surgical clip at right MCA bifurcation.  No new finding OBJECTIVE Vitals:   02/21/24 0749 02/21/24 0751 02/21/24 0753 02/21/24 0754  BP:      Pulse: (!) 116     Resp: 20     Temp: 100.2 F (37.9 C)     TempSrc: Axillary     SpO2: 98% 98% 98% 98%  Weight:      Height:        CBC    Component Value Date/Time   WBC 11.5 (H) 02/22/2024 0545   RBC 2.96 (L) 02/22/2024 0545   HGB 8.5 (L) 02/22/2024 0545   HCT 30.1 (L) 02/22/2024 0545   PLT 352 02/22/2024 0545   MCV 101.7 (H) 02/22/2024 0545   MCH 28.7 02/22/2024 0545   MCHC 28.2 (L) 02/22/2024 0545   RDW 14.0 02/22/2024 0545   LYMPHSABS 1.0 02/15/2024 0659   MONOABS 0.6 02/15/2024 0659   EOSABS 0.0 02/15/2024 0659   BASOSABS 0.0 02/15/2024 0659    BMET    Component Value Date/Time   NA 156 (H) 02/22/2024 0545   NA 137 05/07/2023 0845   K 4.0 02/22/2024 0545   CL 120 (H) 02/22/2024 0545   CO2 28 02/22/2024 0545   GLUCOSE 137 (H) 02/22/2024 0545   BUN 33 (H) 02/22/2024 0545   BUN 15 05/07/2023 0845   CREATININE 0.95 02/22/2024 0545   CREATININE 0.74 08/19/2023 1015   CALCIUM 8.6 (L) 02/22/2024 0545   EGFR 102 08/19/2023 1015   EGFR 91 05/07/2023 0845   GFRNONAA >60 02/22/2024 0545    IMAGING past 24 hours CT HEAD WO CONTRAST ( ) Result Date: 02/22/2024 EXAM: CT HEAD WITHOUT CONTRAST 02/22/2024 04:36:33 AM TECHNIQUE: CT of the head was performed without the  administration of intravenous contrast. Automated exposure control, iterative reconstruction, and/or weight based adjustment of the mA/kV was utilized to reduce the radiation dose to as low as reasonably achievable. COMPARISON: CT head without contrast 10/18/2023. CLINICAL HISTORY: Stroke, follow up. F/u bleed. FINDINGS: BRAIN AND VENTRICLES: Right temporal parenchymal hemorrhage, stable in size, measuring 4.1 cm. Surgical clip is present at the right MCA bifurcation. Vasogenic edema and encephalomalacia of the right temporal lobe, otherwise stable. Previously seen pneumocephalus has resolved. ORBITS: No acute abnormality. SINUSES: The left frontal sinus is near completely opacified. SOFT TISSUES AND SKULL: Soft tissue swelling and fluid over the right frontotemporal craniotomy, continuing to improve. IMPRESSION: 1. Stable right temporal parenchymal hemorrhage measuring 4.1 cm, vasogenic edema, and encephalomalacia. 2. Surgical clip at the right MCA bifurcation. 3. Previously seen pneumocephalus has resolved. 4. Soft tissue swelling and fluid over the right frontotemporal craniotomy continues to improve. 5. Left frontal sinus near complete opacification. 6. Bilateral middle ear and mastoid effusions. Electronically signed by: Lonni Necessary MD 02/22/2024 05:21 AM EDT RP Workstation: [id number]     PHYSICAL EXAM General:  Well-nourished, well-developed patient in no acute distress, intubated CV: Regular rate and rhythm on monitor Respiratory:  Regular, unlabored  respirations on vent    NEURO:  Patient still intubated, eyes halfway open on stimulation, midline 2mm pupils, weak corneal reflex, positive gag and cough, PEERL. Mild withdraw at RUE and RLE, with minimal withdrawal on LUE and LLE. No signifigant change in neurological exam from yesterday  Most Recent NIH  Dizziness Present: No (08/09 0400) Headache Present: No (08/09 0400) Interval: Other (Comment) (08/09 0400) Level of Consciousness  (1a.)   : Not alert, requires repeated stimulation to attend, or is obtunded and requires strong or painful stimulation to make movements (not stereotyped) (08/09 1400) LOC Questions (1b. )   : Answers neither question correctly (08/09 1400) LOC Commands (1c. )   : Performs neither task correctly (08/09 1400) Best Gaze (2. )  : Partial gaze palsy (08/09 1400) Visual (3. )  : Bilateral hemianopia (blind including cortical blindness) (08/09 1400) Facial Palsy (4. )    : Normal symmetrical movements (08/09 1400) Motor Arm, Left (5a. )   : No effort against gravity (08/09 1400) Motor Arm, Right (5b. ) : Some effort against gravity (08/09 1400) Motor Leg, Left (6a. )  : No effort against gravity (08/09 1400) Motor Leg, Right (6b. ) : Some effort against gravity (08/09 1400) Limb Ataxia (7. ): Absent (08/09 1400) Sensory (8. )  : Mild-to-moderate sensory loss, patient feels pinprick is less sharp or is dull on the affected side, or there is a loss of superficial pain with pinprick, but patient is aware of being touched (08/09 1400) Best Language (9. )  : Mute, global aphasia (08/09 1400) Dysarthria (10. ): Intubated or other physical barrier (08/09 1400) Extinction/Inattention (11.)   : Profound hemi-inattention or extinction to more than one modality (08/09 1400) Complete NIHSS TOTAL: 26 (08/09 1400)     ASSESSMENT/PLAN Ms. Shantoya Geurts is a 48 y.o. female with history of interstitial lung disease on CellCept, hypertension, hyperlipidemia, asthma admitted for headache, nausea vomiting and altered mental status. No TNK given due to [facility]. WBC continues to improve on Rocephin . Continues to have low grade fever. Sodium has increased form 160 to 163, free water  increased from 100 mL to 200 mL every 4 hours. Continue current management and restart 3% once Na decreases below 153. Will plan to get another CT tomorrow.      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  Nearly occluded right A1 branch.  Occluded proximal M1 branch with reconstitution of M2 branches in the sylvian fissure via collaterals 2D Echo EF > 75% LDL 74 HgbA1c 5.3 UDS negative Heparin  subcu for VTE prophylaxis No  antithrombotic prior to admission, now on No antithrombotic due to ICH Ongoing aggressive stroke risk factor management Therapy recommendations: Pending Disposition: Pending   Cerebral edema Brain herniation CT head 8/2 showed intracranial mass effect with widespread cerebral edema, right uncal herniation, trapped left lateral ventricle, effaced basilar cisterns, possible Duret hemorrhage in the right brainstem Status post hematoma evacuation with Dr. Lanis CT head 8/3 substantial evacuation of right temporal lobe hematoma, decreased right SDH, basilar cistern predominant SAH and decreased mass effect, residual leftward midline shift 4 to 6 mm, stable volume new IVH without ventriculomegaly, right temporal lobe  surgical vascular clips in place CT 8/4 Similar parenchymal hemorrhage in the right temporal lobe with surrounding edema and local mass effect, resulting in approximately 5 mm leftward midline shift (previously 6 mm). CT 8/6 stable right temporal lobe hemorrhage with expected evolution of surrounding vasogenic edema. Stable edematous changes and stable partial effacement of right lateral ventricle. No hydrocephalus.  CT repeat in a.m. Status post 23.4% bolus 8/2 Now on 3% saline @ 75 -> 50-> off Na Q6 with goal 150-155 Na 137--141--140--149-155-159--165--161--160 - 163 Start free water  100->200 cc every 4 hours   Respiratory failure Intubated on sedation Vent management per CCM Still on propofol , off fentanyl  Tracheostomy planned for early next week per Dr. Shelah   ? Mycotic aneurysm Interstitial lung disease on CellCept Fever  S/p Right temporal craniotomy for evacuation of hematoma with clipping of distal RMCA aneurysm  Concerning for mycotic aneurysm, especially that pt is on cellcept with ILD Blood culture NGTD Tmax 101-100.5--100--100.7 Tachycardia improved Leukocytosis WBC 19.4--16.5--12.1 -- 10.8 -- 10.9 On Rocephin    Hypertension Mildly hypertensive, or Norvasc  10 and losartan  100 Off Cleviprex    On amlodipine  10, hydralazine  50 Q6h, losartan  100 daily, coreg  12.5 bid BP goal less than 160 Long-term BP goal normotensive   Hyperlipidemia Home meds: None LDL 74, goal < 70 Consider low dose statin at discharge   Dysphagia NPO S/p cortrak On tube feeding at 50cc Free water  100->200 cc every 4h   Other Stroke Risk Factors Obesity, Body mass index is 37.97 kg/m.    Other Active Problems Asthma Tachycardia on coreg , improved  Hospital day # 7      CT head this morning shows stable appearance of the right temporal parenchymal hematoma with cytotoxic edema.  Patient will need TEE to look for endocarditis.  Check lab work for vasculitis.  Lower extremity venous  Dopplers for DVT and TCD bubble for PFO.  Her daughter is at bedside.  Patient still minimally responsive, hardly open eyes on pain stimulation, positive corneal and gag, PERRL, doll's eyes present.  Slight movement on the right upper and lower extremity with pain stimulation.  Discussed with critical care team   This patient is critically ill and at significant risk of neurological worsening, death and care requires constant monitoring of vital signs, hemodynamics,respiratory and cardiac monitoring, extensive review of multiple databases, frequent neurological assessment, discussion with family, other specialists and medical decision making of high complexity.I have made any additions or clarifications directly to the above note.This critical care time does not reflect procedure time, or teaching time or supervisory time of PA/NP/Med Resident etc but could involve care discussion time.  I spent 34 minutes of neurocritical care time  in the care of  this patient.  Eather Popp, MD  To contact Stroke Continuity provider, please refer to [email or web address]. After hours, contact General Neurology

## 2024-02-22 NOTE — Progress Notes (Signed)
 NAME:  Katie Knight, MRN:  [id number], DOB:  1976-06-22, LOS: 7 ADMISSION DATE:  02/15/2024, CONSULTATION DATE:  02/15/2024 REFERRING MD:  Dr. 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  Stroke code stroke CTA showed large intraparenchymal hemorrhage on the right side with some subdural subarachnoid hemorrhage as well.  Neurosurgery and neurology consulted for further management.  PCCM consulted for admission.  On ED arrival patient was seen mildly hypothermic, bradycardic, and hypertensive.  Lab work significant for potassium 3.3, glucose 216, hemoglobin 11 point.   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 Taken for crani and evacuation. Clipping of distal RMCA aneurysm  8/3 - Agitated overnight. BP remains at upper limit of goal.2L positive.Tmax 100.1. Tolerating SBT 8/6 repeat head CT with stable right temporal lobe hemorrhage with expected evolution of surrounding vasogenic edema and stable edematous changes and probable infarct in the posterior limb of the internal capsule 8/7 no acute issues overnight 8/9 head CT >> right temporal parenchymal hemorrhage 4.1 cm stable in size.  No  significant change vasogenic edema, encephalomalacia of the right temporal lobe.  Previously seen pneumocephalus has resolved  Interim History / Subjective:   0.40, PEEP 5 Propofol  off I/O+ 1.1 L total Na 163 > 156 on free water , off 3% saline  Objective    Blood pressure 136/64, pulse 97, temperature 99 F (37.2 C), temperature source Axillary, resp. rate (!) 0, height 5' 7.01 (1.702 m), weight 123 kg, SpO2 100%.    Vent Mode: PRVC FiO2 (%):  [40 %] 40 % Set Rate:  [20 bmp] 20 bmp Vt Set:  [500 mL] 500 mL PEEP:  [5 cmH20] 5 cmH20 Pressure Support:  [10 cmH20] 10 cmH20 Plateau Pressure:  [14 cmH20-18 cmH20] 18 cmH20   Intake/Output Summary (Last 24 hours) at 02/22/2024 0802 Last data filed at 02/22/2024 0600 Gross per 24 hour  Intake 2231.56 ml  Output 2050 ml  Net 181.56 ml   Filed Weights   02/17/24 0722 02/18/24 0723 02/22/24 0500  Weight: 110 kg 110 kg 123 kg    Examination:  General: Obese ill-appearing woman, ventilated HEENT: ET tube in good position, oropharynx moist Neuro: Disconjugate gaze, right pupil slightly larger and sluggish, left pupil brisk.  Toes upgoing.  She did move head and bilateral upper extremities to stimulation.  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  No plan to extubate due to encephalopathy and impaired airway protection.  She would need tracheostomy if we are to push forward.  This has been discussed with family and they appear to be in favor.  Will plan for early next week - Minimizing sedation - Pulmonary hygiene - VAP prevention orders - CellCept on hold in the setting of acute illness - Plan complete 7-day course empiric ceftriaxone , following cultures, fever curve, WBC, intermittent chest x-ray  SIRS with fever Immune suppressed due to cellcept since April 2025  P: -Empiric 7-day course ceftriaxone  planned - CellCept on hold - Following fever curve, CBC  Therapeutic Hypernatermia -163 > 156 P: -Continue free water , decrease slightly on 8/9 and continue to follow sodium  Anemia of critical illness P: - Continue to follow CBC - Hemoglobin goal 7.0  Hyperglycemia - Unknown history of diabetes with last hemoglobin A1c October 2024 was 5.7 P: - Continue moderate SSI protocol   Hypertension Hyperlipidemia -Appears patient is on no medications at baseline for hypertension P:  - Continue carvedilol , hydralazine , losartan  - SBP goal <160  Constipation P: - Continue senna, Colace, MiraLAX , Dulcolax suppository - Enema if needed   Best Practice (right click and Reselect all SmartList Selections daily)   Diet/type: tubefeeds.  DVT prophylaxis SCD Pressure ulcer(s): N/A GI prophylaxis: PPI Lines: N/A Foley:  Yes, and it is still needed Code Status:  full code Last date of multidisciplinary goals of care discussion: wife and mother updated at bedside. Discussed overview of course of prolonged critical illness including feeding tube, probable tracheostomy, LTACH in best case scenario.   8/8 --updated wife and mother at bedside regarding active issues, prognosis, fact  that she will require tracheostomy if we are to push forward to follow for any evidence of neurological recovery. 8/8 --updated patient's cousin at bedside   CRITICAL CARE  Total critical care time: 31 minutes  Critical care time was exclusive of separately billable procedures and treating other patients.  Critical care was necessary to treat or prevent imminent or life-threatening deterioration.  Critical care was time spent personally by me on the following activities: development of treatment plan with patient and/or surrogate as well as nursing, discussions with consultants, evaluation of patient's response to treatment, examination of patient, obtaining history from patient or surrogate, ordering and performing treatments and interventions, ordering and review of laboratory studies, ordering and review of radiographic studies, pulse oximetry and re-evaluation of patient's condition.   Lamar Chris, MD, PhD 02/22/2024, 8:02 AM [facility] [phone] or if no answer before 7:00PM call [phone] For any issues after 7:00PM please call eLink [phone]

## 2024-02-23 DIAGNOSIS — I611 Nontraumatic intracerebral hemorrhage in hemisphere, cortical: Secondary | ICD-10-CM | POA: Diagnosis not present

## 2024-02-23 DIAGNOSIS — J9602 Acute respiratory failure with hypercapnia: Secondary | ICD-10-CM | POA: Diagnosis not present

## 2024-02-23 DIAGNOSIS — R29724 NIHSS score 24: Secondary | ICD-10-CM | POA: Diagnosis not present

## 2024-02-23 DIAGNOSIS — I609 Nontraumatic subarachnoid hemorrhage, unspecified: Secondary | ICD-10-CM | POA: Diagnosis not present

## 2024-02-23 DIAGNOSIS — I6200 Nontraumatic subdural hemorrhage, unspecified: Secondary | ICD-10-CM | POA: Diagnosis not present

## 2024-02-23 DIAGNOSIS — R6511 Systemic inflammatory response syndrome (SIRS) of non-infectious origin with acute organ dysfunction: Secondary | ICD-10-CM | POA: Diagnosis not present

## 2024-02-23 DIAGNOSIS — I619 Nontraumatic intracerebral hemorrhage, unspecified: Secondary | ICD-10-CM | POA: Diagnosis not present

## 2024-02-23 DIAGNOSIS — J9601 Acute respiratory failure with hypoxia: Secondary | ICD-10-CM | POA: Diagnosis not present

## 2024-02-23 LAB — BASIC METABOLIC PANEL WITH GFR
Anion gap: 10 (ref 5–15)
BUN: 37 mg/dL — ABNORMAL HIGH (ref 6–20)
CO2: 28 mmol/L (ref 22–32)
Calcium: 8.6 mg/dL — ABNORMAL LOW (ref 8.9–10.3)
Chloride: 122 mmol/L — ABNORMAL HIGH (ref 98–111)
Creatinine, Ser: 0.96 mg/dL (ref 0.44–1.00)
GFR, Estimated: >60 mL/min (ref >60)
Glucose, Bld: 131 mg/dL — ABNORMAL HIGH (ref 70–99)
Potassium: 4.1 mmol/L (ref 3.5–5.1)
Sodium: 160 mmol/L — ABNORMAL HIGH (ref 135–145)

## 2024-02-23 LAB — GLUCOSE, CAPILLARY
Glucose-Capillary: 129 mg/dL — ABNORMAL HIGH (ref 70–99)
Glucose-Capillary: 121 mg/dL — ABNORMAL HIGH (ref 70–99)
Glucose-Capillary: 129 mg/dL — ABNORMAL HIGH (ref 70–99)
Glucose-Capillary: 133 mg/dL — ABNORMAL HIGH (ref 70–99)

## 2024-02-23 LAB — CBC
HCT: 29.4 % — ABNORMAL LOW (ref 36.0–46.0)
Hemoglobin: 8.3 g/dL — ABNORMAL LOW (ref 12.0–15.0)
MCH: 29.2 pg (ref 26.0–34.0)
MCHC: 28.2 g/dL — ABNORMAL LOW (ref 30.0–36.0)
MCV: 103.5 fL — ABNORMAL HIGH (ref 80.0–100.0)
Platelets: 378 K/uL (ref 150–400)
RBC: 2.84 MIL/uL — ABNORMAL LOW (ref 3.87–5.11)
RDW: 13.9 % (ref 11.5–15.5)
WBC: 11.3 K/uL — ABNORMAL HIGH (ref 4.0–10.5)
nRBC: 0 % (ref 0.0–0.2)

## 2024-02-23 LAB — MAGNESIUM: Magnesium: 2.9 mg/dL — ABNORMAL HIGH (ref 1.7–2.4)

## 2024-02-23 LAB — PHOSPHORUS: Phosphorus: 3.6 mg/dL (ref 2.5–4.6)

## 2024-02-23 MED ORDER — LOSARTAN POTASSIUM 50 MG PO TABS
50.0000 mg | ORAL_TABLET | Freq: Every day | ORAL | Status: DC
  Administered 2024-02-24 – 2024-02-27 (×7): 50 mg
  Filled 2024-02-23 (×4): qty 1

## 2024-02-23 MED ORDER — FREE WATER
200.0000 mL | Status: DC
  Administered 2024-02-23 – 2024-02-28 (×47): 200 mL

## 2024-02-23 MED ORDER — HYDRALAZINE HCL 25 MG PO TABS: 25.0000 mg | ORAL_TABLET | Freq: Four times a day (QID) | ORAL | Status: DC

## 2024-02-23 NOTE — Plan of Care (Signed)
  Problem: Education: Goal: Ability to describe self-care measures that may prevent or decrease complications (Diabetes Survival Skills Education) will improve 02/23/2024 0451 by Katie Elenore RAMAN, RN Outcome: Progressing 02/23/2024 0451 by Katie Elenore RAMAN, RN Outcome: Progressing Goal: Individualized Educational Video(s) 02/23/2024 0451 by Katie Elenore RAMAN, RN Outcome: Progressing 02/23/2024 0451 by Katie Elenore RAMAN, RN Outcome: Progressing   Problem: Coping: Goal: Ability to adjust to condition or change in health will improve 02/23/2024 0451 by Katie Elenore RAMAN, RN Outcome: Progressing 02/23/2024 0451 by Katie Elenore RAMAN, RN Outcome: Progressing   Problem: Fluid Volume: Goal: Ability to maintain a balanced intake and output will improve 02/23/2024 0451 by Katie Elenore RAMAN, RN Outcome: Progressing 02/23/2024 0451 by Katie Elenore RAMAN, RN Outcome: Progressing   Problem: Metabolic: Goal: Ability to maintain appropriate glucose levels will improve 02/23/2024 0451 by Katie Elenore RAMAN, RN Outcome: Progressing 02/23/2024 0451 by Katie Elenore RAMAN, RN Outcome: Progressing   Problem: Nutritional: Goal: Maintenance of adequate nutrition will improve 02/23/2024 0451 by Katie Elenore RAMAN, RN Outcome: Progressing 02/23/2024 0451 by Katie Elenore RAMAN, RN Outcome: Progressing Goal: Progress toward achieving an optimal weight will improve 02/23/2024 0451 by Katie Elenore RAMAN, RN Outcome: Progressing 02/23/2024 0451 by Katie Elenore RAMAN, RN Outcome: Progressing   Problem: Skin Integrity: Goal: Risk for impaired skin integrity will decrease 02/23/2024 0451 by Katie Elenore RAMAN, RN Outcome: Progressing 02/23/2024 0451 by Katie Elenore RAMAN, RN Outcome: Progressing   Problem: Tissue Perfusion: Goal: Adequacy of tissue perfusion will improve 02/23/2024 0451 by Katie Elenore RAMAN, RN Outcome: Progressing 02/23/2024 0451 by Katie Elenore RAMAN, RN Outcome: Progressing    Problem: Clinical Measurements: Goal: Ability to maintain clinical measurements within normal limits will improve 02/23/2024 0451 by Katie Elenore RAMAN, RN Outcome: Progressing 02/23/2024 0451 by Katie Elenore RAMAN, RN Outcome: Progressing Goal: Will remain free from infection 02/23/2024 0451 by Katie Elenore RAMAN, RN Outcome: Progressing 02/23/2024 0451 by Katie Elenore RAMAN, RN Outcome: Progressing Goal: Diagnostic test results will improve 02/23/2024 0451 by Katie Elenore RAMAN, RN Outcome: Progressing 02/23/2024 0451 by Katie Elenore RAMAN, RN Outcome: Progressing Goal: Respiratory complications will improve 02/23/2024 0451 by Katie Elenore RAMAN, RN Outcome: Progressing 02/23/2024 0451 by Katie Elenore RAMAN, RN Outcome: Progressing Goal: Cardiovascular complication will be avoided 02/23/2024 0451 by Katie Elenore RAMAN, RN Outcome: Progressing 02/23/2024 0451 by Katie Elenore RAMAN, RN Outcome: Progressing   Problem: Coping: Goal: Level of anxiety will decrease 02/23/2024 0451 by Katie Elenore RAMAN, RN Outcome: Progressing 02/23/2024 0451 by Katie Elenore RAMAN, RN Outcome: Progressing   Problem: Safety: Goal: Ability to remain free from injury will improve 02/23/2024 0451 by Katie Elenore RAMAN, RN Outcome: Progressing 02/23/2024 0451 by Katie Elenore RAMAN, RN Outcome: Progressing   Problem: Skin Integrity: Goal: Risk for impaired skin integrity will decrease Outcome: Progressing

## 2024-02-23 NOTE — Progress Notes (Signed)
 STROKE TEAM PROGRESS NOTE   SUBJECTIVE (INTERIM HISTORY)  Her wife and mother are  at bedside.  Patient remains intubated for respiratory failure and is unable to be weaned off ventilatory support.  She opens eyes partially to stimulation   following commands few midline and more on the right side.  She spontaneously moves right upper and lower extremity but has no movement in the left upper extremity.  There is trace withdrawal in the left lower extremity.  Blood pressure adequately controlled. She continues to have low-grade fever.  Blood cultures have been negative so far.  TEE is planned for next week. OBJECTIVE Vitals:   02/21/24 0749 02/21/24 0751 02/21/24 0753 02/21/24 0754  BP:      Pulse: (!) 116     Resp: 20     Temp: 100.2 F (37.9 C)     TempSrc: Axillary     SpO2: 98% 98% 98% 98%  Weight:      Height:        CBC    Component Value Date/Time   WBC 11.3 (H) 02/23/2024 0421   RBC 2.84 (L) 02/23/2024 0421   HGB 8.3 (L) 02/23/2024 0421   HCT 29.4 (L) 02/23/2024 0421   PLT 378 02/23/2024 0421   MCV 103.5 (H) 02/23/2024 0421   MCH 29.2 02/23/2024 0421   MCHC 28.2 (L) 02/23/2024 0421   RDW 13.9 02/23/2024 0421   LYMPHSABS 1.0 02/15/2024 0659   MONOABS 0.6 02/15/2024 0659   EOSABS 0.0 02/15/2024 0659   BASOSABS 0.0 02/15/2024 0659    BMET    Component Value Date/Time   NA 160 (H) 02/23/2024 0421   NA 137 05/07/2023 0845   K 4.1 02/23/2024 0421   CL 122 (H) 02/23/2024 0421   CO2 28 02/23/2024 0421   GLUCOSE 131 (H) 02/23/2024 0421   BUN 37 (H) 02/23/2024 0421   BUN 15 05/07/2023 0845   CREATININE 0.96 02/23/2024 0421   CREATININE 0.74 08/19/2023 1015   CALCIUM 8.6 (L) 02/23/2024 0421   EGFR 102 08/19/2023 1015   EGFR 91 05/07/2023 0845   GFRNONAA >60 02/23/2024 0421    IMAGING past 24 hours No results found.    PHYSICAL EXAM General:  Well-nourished, well-developed patient in no acute distress, intubated CV: Regular rate and rhythm on  monitor Respiratory:  Regular, unlabored respirations on vent    NEURO:  Patient still intubated, eyes halfway open on stimulation, midline 2mm pupils, weak corneal reflex, positive gag and cough, PEERL. Mild withdraw at RUE and RLE, with minimal withdrawal on LUE and LLE. No signifigant change in neurological exam from yesterday  Most Recent NIH  Dizziness Present: No (08/09 0400) Headache Present: No (08/09 0400) Interval: Other (Comment) (08/09 0400) Level of Consciousness (1a.)   : Not alert, but arousable by minor stimulation to obey, answer, or respond (08/10 1000) LOC Questions (1b. )   : Answers neither question correctly (08/10 1000) LOC Commands (1c. )   : Performs neither task correctly (08/10 1000) Best Gaze (2. )  : Partial gaze palsy (08/10 1000) Visual (3. )  : Complete hemianopia (08/10 1000) Facial Palsy (4. )    : Normal symmetrical movements (08/10 1000) Motor Arm, Left (5a. )   : No effort against gravity (08/10 1000) Motor Arm, Right (5b. ) : Some effort against gravity (08/10 1000) Motor Leg, Left (6a. )  : No effort against gravity (08/10 1000) Motor Leg, Right (6b. ) : Some effort against gravity (08/10 1000)  Limb Ataxia (7. ): Absent (08/10 1000) Sensory (8. )  : Mild-to-moderate sensory loss, patient feels pinprick is less sharp or is dull on the affected side, or there is a loss of superficial pain with pinprick, but patient is aware of being touched (08/10 1000) Best Language (9. )  : Mute, global aphasia (08/10 1000) Dysarthria (10. ): Intubated or other physical barrier (08/10 1000) Extinction/Inattention (11.)   : Profound hemi-inattention or extinction to more than one modality (08/10 1000) Complete NIHSS TOTAL: 24 (08/10 1000)     ASSESSMENT/PLAN [name] is a 48 y.o. female with history of interstitial lung disease on CellCept, hypertension, hyperlipidemia, asthma admitted for headache, nausea vomiting and altered mental status. No TNK  given due to [facility]. WBC continues to improve on Rocephin . Continues to have low grade fever. Sodium has increased form 160 to 163, free water  increased from 100 mL to 200 mL every 4 hours. Continue current management and restart 3% once Na decreases below 153. Will plan to get another CT tomorrow.      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  Nearly occluded right A1 branch.  Occluded proximal M1 branch with reconstitution of M2 branches in the sylvian fissure via collaterals 2D Echo EF > 75% LDL 74 HgbA1c 5.3 UDS negative Heparin  subcu for VTE prophylaxis No antithrombotic prior to admission, now on No antithrombotic due to ICH Ongoing aggressive stroke risk factor management Therapy recommendations: Pending Disposition: Pending   Cerebral edema Brain herniation CT head 8/2 showed intracranial mass effect with widespread cerebral edema, right uncal herniation, trapped left lateral ventricle, effaced  basilar cisterns, possible Duret hemorrhage in the right brainstem Status post hematoma evacuation with Dr. Lanis CT head 8/3 substantial evacuation of right temporal lobe hematoma, decreased right SDH, basilar cistern predominant SAH and decreased mass effect, residual leftward midline shift 4 to 6 mm, stable volume new IVH without ventriculomegaly, right temporal lobe surgical vascular clips in place CT 8/4 Similar parenchymal hemorrhage in the right temporal lobe with surrounding edema and local mass effect, resulting in approximately 5 mm leftward midline shift (previously 6 mm). CT 8/6 stable right temporal lobe hemorrhage with expected evolution of surrounding vasogenic edema. Stable edematous changes and stable partial effacement of right lateral ventricle. No hydrocephalus.  CT repeat in a.m. Status post 23.4% bolus 8/2 Now on 3% saline @ 75 -> 50-> off Na Q6 with goal 150-155 Na 137--141--140--149-155-159--165--161--160 - 163 Start free water  100->200 cc every 4 hours   Respiratory failure Intubated on sedation Vent management per CCM Still on propofol , off  fentanyl  Tracheostomy planned for early next week per Dr. Shelah   ? 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, especially that pt is on cellcept with ILD Blood culture NGTD Tmax 101-100.5--100--100.7 Tachycardia improved Leukocytosis WBC 19.4--16.5--12.1 -- 10.8 -- 10.9 On Rocephin    Hypertension Mildly hypertensive, or Norvasc  10 and losartan  100 Off Cleviprex    On amlodipine  10, hydralazine  50 Q6h, losartan  100 daily, coreg  12.5 bid BP goal less than 160 Long-term BP goal normotensive   Hyperlipidemia Home meds: None LDL 74, goal < 70 Consider low dose statin at discharge   Dysphagia NPO S/p cortrak On tube feeding at 50cc Free water   100->200 cc every 4h   Other Stroke Risk Factors Obesity, Body mass index is 37.97 kg/m.    Other Active Problems Asthma Tachycardia on coreg , improved  Hospital day # 8      Patient neurological exam shows slight improvement and she is arousable and follows few simple midline commands and commands on the right side.  She will continues to have left-sided weakness.  She continues to fail weaning trials and wife and mother agreeable to long-term ventilatory support and tracheostomy and PEG tube if necessary.  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 Ruther critical care medicine.  Long discussion with patient's family at the bedside and answered questions   This patient is critically ill and at significant risk of neurological worsening, death and care requires constant monitoring of vital signs, hemodynamics,respiratory and cardiac monitoring, extensive review of multiple databases, frequent neurological assessment, discussion with family, other specialists and medical decision making of high complexity.I have made any additions or clarifications directly to the above note.This critical care time does not reflect procedure time, or teaching time or supervisory time of PA/NP/Med Resident etc but could involve care discussion time.  I spent 30 minutes of neurocritical care time  in the care of  this patient.     Eather Popp, MD  To contact Stroke Continuity provider, please refer to [email or web address]. After hours, contact General Neurology

## 2024-02-23 NOTE — Progress Notes (Signed)
 NAME:  Katie Knight, MRN:  [id number], DOB:  [birth year], LOS: 8 ADMISSION DATE:  02/15/2024, CONSULTATION DATE:  02/15/2024 REFERRING MD:  Dr. 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  Stroke code stroke CTA showed large intraparenchymal hemorrhage on the right side with some subdural subarachnoid hemorrhage as well.  Neurosurgery and neurology consulted for further management.  PCCM consulted for admission.  On ED arrival patient was seen mildly hypothermic, bradycardic, and hypertensive.  Lab work significant for potassium 3.3, glucose 216, hemoglobin 11 point.   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 Taken for crani and evacuation. Clipping of distal RMCA aneurysm  8/3 - Agitated overnight. BP remains at upper limit of goal.2L positive.Tmax 100.1. Tolerating SBT 8/6 repeat head CT with stable right temporal lobe hemorrhage with expected evolution of surrounding vasogenic edema and stable edematous changes and probable infarct in the posterior limb of the internal capsule 8/7 no acute issues overnight 8/9 head CT >> right temporal parenchymal hemorrhage 4.1 cm stable in size.  No  significant change vasogenic edema, encephalomalacia of the right temporal lobe.  Previously seen pneumocephalus has resolved  Interim History / Subjective:  No significant changes reported, no change in mental status I/O+ 1 L total Na 160 Sedation is off Tolerates some PSV  Objective    Blood pressure (!) 123/58, pulse (!) 116, temperature 99.3 F (37.4 C), temperature source Axillary, resp. rate 20, height 5' 7.01 (1.702 m), weight 121.9 kg, SpO2 98%.    Vent Mode: PRVC FiO2 (%):  [40 %] 40 % Set Rate:  [20 bmp] 20 bmp Vt Set:  [500 mL] 500 mL PEEP:  [5 cmH20] 5 cmH20 Pressure Support:  [5 cmH20] 5 cmH20 Plateau Pressure:  [18 cmH20-21 cmH20] 20 cmH20   Intake/Output Summary (Last 24 hours) at 02/23/2024 0748 Last data filed at 02/23/2024 9378 Gross per 24 hour  Intake 2200 ml  Output 2300 ml  Net -100 ml   Filed Weights   02/18/24 0723 02/22/24 0500 02/23/24 0400  Weight: 110 kg 123 kg 121.9 kg    Examination:  General: Obese young woman, no distress off sedation HEENT: ET tube in good position, oropharynx moist Neuro: Opening eyes spontaneously.  More conjugate gaze.  Right pupil sluggish, left pupil brisk.  Toes upgoing.  Moves her head/neck spontaneously, moves bilateral upper extremities to stimulation.  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  Have discussed tracheostomy with family and they are in favor.  Will try to get this set up for this week -Minimize sedation -Pulmonary hygiene -VAP prevention orders - CellCept is on hold in the setting of critical illness - Complete 7-day course empiric ceftriaxone .  Continue to follow cultures, WBC, intermittent chest x-ray  SIRS with fever Immune suppressed due to cellcept since April 2025  P: - Empiric 7-day course ceftriaxone  - CellCept remains on hold - Following fever curve and CBC  Therapeutic Hypernatermia -163 > 156 > 160 P: -Free water  decreased on 8/9 with sodium rising, 160 this morning.  Plan change back to every 4 hours on 8/10 and follow BMP  Anemia of critical illness P: -Following CBC - Hemoglobin of 7.0  Hyperglycemia - Unknown history of diabetes with last hemoglobin A1c October 2024 was 5.7 P: -Moderate sliding scale insulin  protocol   Hypertension Hyperlipidemia -Appears patient is on no medications at baseline for hypertension P:  - Continue carvedilol , hydralazine , losartan  - SBP goal <160  Constipation P: - Continue senna, Colace, MiraLAX , Dulcolax suppository - Enema if needed   Best Practice (right click and Reselect all SmartList Selections daily)   Diet/type: tubefeeds.  DVT prophylaxis SCD Pressure ulcer(s): N/A GI prophylaxis: PPI Lines: N/A Foley:  Yes, and it is still needed Code Status:  full code Last date of multidisciplinary goals of care discussion: wife and mother updated at bedside. Discussed overview of course of prolonged critical illness including feeding tube, probable tracheostomy, LTACH in best case scenario.   8/8  --updated wife and mother at bedside regarding active issues, prognosis, fact that she will require tracheostomy if we are to push forward to follow for any evidence of neurological recovery. 8/9 --updated patient's cousin at bedside  8/10 --updated patient's wife and mother at bedside.  They agree with tracheostomy when we are able to perform  CRITICAL CARE  Total critical care time: 31 minutes  Critical care time was exclusive of separately billable procedures and treating other patients.  Critical care was necessary to treat or prevent imminent or life-threatening deterioration.  Critical care was time spent personally by me on the following activities: development of treatment plan with patient and/or surrogate as well as nursing, discussions with consultants, evaluation of patient's response to treatment, examination of patient, obtaining history from patient or surrogate, ordering and performing treatments and interventions, ordering and review of laboratory studies, ordering and review of radiographic studies, pulse oximetry and re-evaluation of patient's condition.   Lamar Chris, MD, PhD 02/23/2024, 7:48 AM [facility] [phone] or if no answer before 7:00PM call [phone] For any issues after 7:00PM please call eLink [phone]

## 2024-02-24 ENCOUNTER — Inpatient Hospital Stay (HOSPITAL_COMMUNITY)

## 2024-02-24 DIAGNOSIS — I639 Cerebral infarction, unspecified: Secondary | ICD-10-CM | POA: Diagnosis not present

## 2024-02-24 DIAGNOSIS — I609 Nontraumatic subarachnoid hemorrhage, unspecified: Secondary | ICD-10-CM | POA: Diagnosis not present

## 2024-02-24 DIAGNOSIS — R29722 NIHSS score 22: Secondary | ICD-10-CM

## 2024-02-24 DIAGNOSIS — I611 Nontraumatic intracerebral hemorrhage in hemisphere, cortical: Secondary | ICD-10-CM | POA: Diagnosis not present

## 2024-02-24 DIAGNOSIS — I6200 Nontraumatic subdural hemorrhage, unspecified: Secondary | ICD-10-CM | POA: Diagnosis not present

## 2024-02-24 DIAGNOSIS — J849 Interstitial pulmonary disease, unspecified: Secondary | ICD-10-CM

## 2024-02-24 LAB — CBC
HCT: 29.6 % — ABNORMAL LOW (ref 36.0–46.0)
Hemoglobin: 8.2 g/dL — ABNORMAL LOW (ref 12.0–15.0)
MCH: 28.8 pg (ref 26.0–34.0)
MCHC: 27.7 g/dL — ABNORMAL LOW (ref 30.0–36.0)
MCV: 103.9 fL — ABNORMAL HIGH (ref 80.0–100.0)
Platelets: 404 K/uL — ABNORMAL HIGH (ref 150–400)
RBC: 2.85 MIL/uL — ABNORMAL LOW (ref 3.87–5.11)
RDW: 13.9 % (ref 11.5–15.5)
WBC: 12.2 K/uL — ABNORMAL HIGH (ref 4.0–10.5)
nRBC: 0 % (ref 0.0–0.2)

## 2024-02-24 LAB — GLUCOSE, CAPILLARY
Glucose-Capillary: 111 mg/dL — ABNORMAL HIGH (ref 70–99)
Glucose-Capillary: 113 mg/dL — ABNORMAL HIGH (ref 70–99)
Glucose-Capillary: 118 mg/dL — ABNORMAL HIGH (ref 70–99)
Glucose-Capillary: 124 mg/dL — ABNORMAL HIGH (ref 70–99)
Glucose-Capillary: 126 mg/dL — ABNORMAL HIGH (ref 70–99)
Glucose-Capillary: 126 mg/dL — ABNORMAL HIGH (ref 70–99)

## 2024-02-24 LAB — MAGNESIUM: Magnesium: 3.2 mg/dL — ABNORMAL HIGH (ref 1.7–2.4)

## 2024-02-24 LAB — BASIC METABOLIC PANEL WITH GFR
Anion gap: 8 (ref 5–15)
BUN: 39 mg/dL — ABNORMAL HIGH (ref 6–20)
CO2: 28 mmol/L (ref 22–32)
Calcium: 8.6 mg/dL — ABNORMAL LOW (ref 8.9–10.3)
Chloride: 122 mmol/L — ABNORMAL HIGH (ref 98–111)
Creatinine, Ser: 0.97 mg/dL (ref 0.44–1.00)
GFR, Estimated: >60 mL/min (ref >60)
Glucose, Bld: 137 mg/dL — ABNORMAL HIGH (ref 70–99)
Potassium: 4.3 mmol/L (ref 3.5–5.1)
Sodium: 158 mmol/L — ABNORMAL HIGH (ref 135–145)

## 2024-02-24 LAB — PROTIME-INR
INR: 1.1 (ref 0.8–1.2)
Prothrombin Time: 14.8 s (ref 11.4–15.2)

## 2024-02-24 LAB — ANA W/REFLEX IF POSITIVE: Anti Nuclear Antibody (ANA): NEGATIVE

## 2024-02-24 LAB — PHOSPHORUS: Phosphorus: 4 mg/dL (ref 2.5–4.6)

## 2024-02-24 NOTE — TOC Progression Note (Signed)
 Transition of Care [facility]) - Progression Note    Patient Details  Name: Katie Knight MRN: [id number] Date of Birth: 1976-07-03  [facility] CM/SW Contact  Robynn Eileen Hoose, RN Phone Number: 02/24/2024, 5:02 PM  Clinical Narrative:     Patient remains on vent, TEE scheduled for Friday.                    Expected Discharge Plan and Services                                               Social Drivers of Health (SDOH) Interventions SDOH Screenings   Food Insecurity: No Food Insecurity (02/21/2024)  Housing: Unknown (02/21/2024)  Transportation Needs: No Transportation Needs (02/21/2024)  Utilities: Not At Risk (02/21/2024)  Tobacco Use: Low Risk  (02/15/2024)    Readmission Risk Interventions     No data to display

## 2024-02-24 NOTE — Progress Notes (Signed)
 VASCULAR LAB    Bilateral lower extremity venous duplex has been performed.  See CV proc for preliminary results.   Gitel Beste, RVT 02/24/2024, 2:26 PM

## 2024-02-24 NOTE — Progress Notes (Signed)
 NAME:  Rayan Dyal, MRN:  [id number], DOB:  [birth year], LOS: 9 ADMISSION DATE:  02/15/2024, CONSULTATION DATE:  02/15/2024 REFERRING MD:  Dr. 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  Stroke code stroke CTA showed large intraparenchymal hemorrhage on the right side with some subdural subarachnoid hemorrhage as well.  Neurosurgery and neurology consulted for further management.  PCCM consulted for admission.  On ED arrival patient was seen mildly hypothermic, bradycardic, and hypertensive.  Lab work significant for potassium 3.3, glucose 216, hemoglobin 11 point.   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 Taken for crani and evacuation. Clipping of distal RMCA aneurysm  8/3 - Agitated overnight. BP remains at upper limit of goal.2L positive.Tmax 100.1. Tolerating SBT 8/6 repeat head CT with stable right temporal lobe hemorrhage with expected evolution of surrounding vasogenic edema and stable edematous changes and probable infarct in the posterior limb of the internal capsule 8/7 no acute issues overnight 8/9 head CT >> right temporal parenchymal  hemorrhage 4.1 cm stable in size.  No significant change vasogenic edema, encephalomalacia of the right temporal lobe.  Previously seen pneumocephalus has resolved 8/11: improved mentation follwing commands but still drowsy. Overnight fevers.   Interim History / Subjective:  Much better MS per family. To me following commands.  ESR >140. Unclear cause.   Objective    Blood pressure 133/66, pulse 99, temperature 98.7 F (37.1 C), temperature source Axillary, resp. rate 18, height 5' 7.01 (1.702 m), weight 121.9 kg, SpO2 100%.    Vent Mode: PSV;CPAP FiO2 (%):  [40 %] 40 % Set Rate:  [20 bmp] 20 bmp Vt Set:  [500 mL] 500 mL PEEP:  [5 cmH20] 5 cmH20 Pressure Support:  [8 cmH20-10 cmH20] 10 cmH20 Plateau Pressure:  [17 cmH20-20 cmH20] 17 cmH20   Intake/Output Summary (Last 24 hours) at 02/24/2024 0842 Last data filed at 02/24/2024 0815 Gross per 24 hour  Intake 2550.07 ml  Output 1900 ml  Net 650.07 ml   Filed Weights   02/18/24 0723 02/22/24 0500 02/23/24 0400  Weight: 110 kg 123 kg 121.9 kg    Examination:  General: Obese young woman, no distress off sedation HEENT: ET tube in good position, oropharynx moist Neuro: IRUL strength 4/5. RLE 3/5. No movement on on the left.  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 MS better but still limited. Good cough and gag.  - Barrier to extubation is mental status.  Tracheostomy has been discussed with the family.  Considering improved mentation may give her a trial at extubation tomorrow or day after. - off sedation for >48 hrs.  -Pulmonary hygiene -VAP prevention orders - CellCept is on hold in the setting of critical illness - 7-day course empiric ceftriaxone  completed .  Cultures negative.  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 - Neurology considering TEE.  Cultures are negative.  Will discuss with neurology regarding TEE today.  Therapeutic Hypernatermia Goal Na per neuro 150-155.  -FWF 200 q4h.   Anemia of critical illness -Following CBC - Slowly decreasing trend of hemoglobin.  Monitor.  Transfuse to keep greater than 7.  Hyperglycemia - Unknown history of diabetes with last hemoglobin A1c October 2024 was 5.7 -Moderate sliding scale insulin  protocol  HTN: - Continue amlodipine , Coreg , losartan .   Hypertension Hyperlipidemia -Appears patient is on no medications at baseline for hypertension P:  - Continue carvedilol , hydralazine , losartan  - SBP goal <160  Constipation P: - Continue senna, Colace, MiraLAX , Dulcolax suppository - Enema if needed   Best Practice (right click and Reselect all SmartList Selections daily)   Diet/type: tubefeeds.  DVT prophylaxis.  Heparin  and SCD. Pressure ulcer(s): N/A GI prophylaxis: H2B Lines: N/A Foley:  Yes, and it is still needed Code Status:  full code  Last date of multidisciplinary goals of care discussion: Discussed with partner and mother about her mental status improvement and may get trial at extubation.  However tracheostomy is still possible.  LTAC consideration has been discussed in the past.  CRITICAL  CARE  Total critical care time: 38 minutes  Critical care time was exclusive of separately billable procedures and treating other patients.  Critical care was necessary to treat or prevent imminent or life-threatening deterioration.  Critical care was time spent personally by me on the following activities: development of treatment plan with patient and/or surrogate as well as nursing, discussions with consultants, evaluation of patient's response to treatment, examination of patient, obtaining history from patient or surrogate, ordering and performing treatments and interventions, ordering and review of laboratory studies, ordering and review of radiographic studies, pulse oximetry and re-evaluation of patient's condition.   Sammi Fredericks, MD.  02/24/2024, 8:42 AM

## 2024-02-24 NOTE — Progress Notes (Signed)
 VASCULAR LAB    TCD bubble study has been performed.  See CV proc for preliminary results.   Jolan Mealor, RVT 02/24/2024, 2:25 PM

## 2024-02-24 NOTE — Progress Notes (Addendum)
 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 Chart reviewed with Dr. Jeffrie. Given recent ICH and sodium, will let her recover for a few more days -  I have scheduled her on Friday for TEE on Friday 02/28/24. We will re-evaluate Thursday for appropriateness. Discussed with neurology who reports improving neurological function, they are hopeful to extubate in the next few days.    ADDENDUM: Through discussion with Dr. Michele, he is in agreement to complete the TEE tomorrow.    Jon Garre Jeffren Dombek, PA-C 02/24/2024, 10:23 AM [phone] [facility]

## 2024-02-24 NOTE — Progress Notes (Signed)
   [facility] has been requested to perform a transesophageal echocardiogram on [place] for endocarditis rule out.    The patient does NOT have any absolute or relative contraindications to a Transesophageal Echocardiogram (TEE).  The patient has: Current Oxygen  Requirement. Patient is currently intubated.     After careful review of history and examination, the risks and benefits of transesophageal echocardiogram have been explained including risks of esophageal damage, perforation (1:10,000 risk), bleeding, pharyngeal hematoma as well as other potential complications associated with conscious sedation including aspiration, arrhythmia, respiratory failure and death. I have discussed this procedure with both the patients wife and her mother, who were both present in the room, as the patient is currently intubated and unable to consent. Alternatives to treatment were discussed, questions were answered. They are both willing to proceed.   Signed, Waddell DELENA Donath, PA-C  02/24/2024 2:05 PM

## 2024-02-24 NOTE — H&P (View-Only) (Signed)
 STROKE TEAM PROGRESS NOTE    SUBJECTIVE (INTERIM HISTORY)  Her wife and mother are  at bedside.  Patient remains intubated for respiratory failure and is unable to be weaned off ventilatory support.  Eyes are open on examination and dysconjugate, right eye midline in the left eye up and out. Patient is able to follow simple commands, more on the right side than left.  Patient now has more movement in the left upper extremity as compared to yesterday.  Patient has spontaneous movement of lower extremities, with trace withdrawal in the left lower extremity. Blood pressure adequately controlled. She continues to have low-grade fever.  Blood cultures have been negative so far.   TEE planned for tomorrow with Dr. Michele and will plan to extubate after that TCD bubble study done at the bedside negative for right-to-left shunt OBJECTIVE  CBC    Component Value Date/Time   WBC 12.2 (H) 02/24/2024 0612   RBC 2.85 (L) 02/24/2024 0612   HGB 8.2 (L) 02/24/2024 0612   HCT 29.6 (L) 02/24/2024 0612   PLT 404 (H) 02/24/2024 0612   MCV 103.9 (H) 02/24/2024 0612   MCH 28.8 02/24/2024 0612   MCHC 27.7 (L) 02/24/2024 0612   RDW 13.9 02/24/2024 0612   LYMPHSABS 1.0 02/15/2024 0659   MONOABS 0.6 02/15/2024 0659   EOSABS 0.0 02/15/2024 0659   BASOSABS 0.0 02/15/2024 0659    BMET    Component Value Date/Time   NA 158 (H) 02/24/2024 0612   NA 137 05/07/2023 0845   K 4.3 02/24/2024 0612   CL 122 (H) 02/24/2024 0612   CO2 28 02/24/2024 0612   GLUCOSE 137 (H) 02/24/2024 0612   BUN 39 (H) 02/24/2024 0612   BUN 15 05/07/2023 0845   CREATININE 0.97 02/24/2024 0612   CREATININE 0.74 08/19/2023 1015   CALCIUM 8.6 (L) 02/24/2024 0612   EGFR 102 08/19/2023 1015   EGFR 91 05/07/2023 0845   GFRNONAA >60 02/24/2024 0612    IMAGING past 24 hours No results found.  Vitals:   02/24/24 0741 02/24/24 0800 02/24/24 0900 02/24/24 1100  BP:  133/66 (!) 113/57 124/66  Pulse: (!) 110 99 88 94  Resp: 19 18 12 20    Temp: 98.7 F (37.1 C)   99.8 F (37.7 C)  TempSrc: Axillary   Axillary  SpO2: 100% 100% 99% 100%  Weight:      Height:         PHYSICAL EXAM General:  Well-nourished, well-developed middle-aged African-American lady in no acute distress intubated, off sedation CV: Regular rate and rhythm on monitor Respiratory:  Regular, unlabored respirations on vent   NEURO:  Patient remains intubated.  On examination, eyes are open spontaneously and open halfway with stimulation.  Pupils 2 mm bilaterally.  Dysconjugate gaze noted-right eye midline, left eye deviated left  Positive gag and cough reflex is present.  Mild withdrawal to noxious stimuli in RUE, LUE, and RLE; minimal withdraw in LLE. Overall more alert compared to yesterday.   Most Recent NIH  Dizziness Present: No (08/11 1200) Headache Present: No (08/11 1200) Interval: Shift assessment (08/11 0800) Level of Consciousness (1a.)   : Not alert, requires repeated stimulation to attend, or is obtunded and requires strong or painful stimulation to make movements (not stereotyped) (08/11 1200) LOC Questions (1b. )   : Answers neither question correctly (08/11 1200) LOC Commands (1c. )   : Performs both tasks correctly (08/11 1200) Best Gaze (2. )  : Partial gaze palsy (08/11 1200)  Visual (3. )  : Complete hemianopia (08/11 1200) Facial Palsy (4. )    : Normal symmetrical movements (08/11 1200) Motor Arm, Left (5a. )   : No effort against gravity (08/11 1200) Motor Arm, Right (5b. ) : Some effort against gravity (08/11 1200) Motor Leg, Left (6a. )  : No effort against gravity (08/11 1200) Motor Leg, Right (6b. ) : Some effort against gravity (08/11 1200) Limb Ataxia (7. ): Absent (08/11 1200) Sensory (8. )  : Mild-to-moderate sensory loss, patient feels pinprick is less sharp or is dull on the affected side, or there is a loss of superficial pain with pinprick, but patient is aware of being touched (08/11 1200) Best Language (9. )  :  Mute, global aphasia (08/11 1200) Dysarthria (10. ): Intubated or other physical barrier (08/11 1200) Extinction/Inattention (11.)   : Visual/tactile/auditory/spatial/personal inattention (08/11 1200) Complete NIHSS TOTAL: 22 (08/11 1200)   ASSESSMENT/PLAN Ms. Dawanna Grauberger is a 48 y.o. female with history of interstitial lung disease on CellCept, hypertension, hyperlipidemia, asthma admitted for headache, nausea vomiting and altered mental status. No TNK given due to [facility]. WBC continues to improve on Rocephin . Continues to have overnight fevers. Sodium is continuing to downtrend with 200 mL free water . Continue current management and restart 3% once Na decreases below 153. We will plan to trial extubate tomorrow with TEE planned this week per Dr. Shelah.   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  Nearly occluded right A1 branch.  Occluded proximal M1 branch with reconstitution of M2 branches in the  sylvian fissure via collaterals 2D Echo EF > 75% LDL 74 HgbA1c 5.3 UDS negative Heparin  subcu for VTE prophylaxis No antithrombotic prior to admission, now on No antithrombotic due to ICH Ongoing aggressive stroke risk factor management Therapy recommendations: Pending Disposition: Pending   Cerebral edema Brain herniation CT head 8/2 showed intracranial mass effect with widespread cerebral edema, right uncal herniation, trapped left lateral ventricle, effaced basilar cisterns, possible Duret hemorrhage in the right brainstem Status post hematoma evacuation with Dr. Lanis CT head 8/3 substantial evacuation of right temporal lobe hematoma, decreased right SDH, basilar cistern predominant SAH and decreased mass effect, residual leftward midline shift 4 to 6 mm, stable volume new IVH without ventriculomegaly, right temporal lobe surgical vascular clips in place CT 8/4 Similar parenchymal hemorrhage in the right temporal lobe with surrounding edema and local mass effect, resulting in approximately 5 mm leftward midline shift (previously 6 mm). CT 8/6 stable right temporal lobe hemorrhage with expected evolution of surrounding vasogenic edema. Stable edematous changes and stable  partial effacement of right lateral ventricle. No hydrocephalus.  CT 8/9 stable right temporal lobe hemorrhage with vasogenic edema and encephalomalacia.  Previously seen pneumocephalus has resolved.  Soft tissue swelling and fluid over the right frontotemporal craniotomy continues to improve. Status post 23.4% bolus 8/2 Now on 3% saline @ 75 -> 50-> off Na Q6 with goal 150-155 Na 137--141--140--149-155-159--165--161--160 - 163 -- 158 Continue free water  100->200 cc every 4 hours   Respiratory failure Intubated on sedation Vent management per CCM Still on propofol , off fentanyl  Tracheostomy planned for early this week per Dr. Shelah   ? 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 Interstitial lung disease on  CellCept Fever  S/p Right temporal craniotomy for evacuation of hematoma with clipping of distal RMCA aneurysm  Concerning for mycotic aneurysm, especially that pt is on cellcept with ILD Blood culture NGTD Tmax 101-100.5--100--100.7 Tachycardia improved Leukocytosis WBC 19.4--16.5--12.1 -- 10.8 -- 10.9 On Rocephin    Hypertension Mildly hypertensive, or Norvasc  10 and losartan  100 Off Cleviprex    On amlodipine  10, hydralazine  50 Q6h, losartan  100 daily, coreg  12.5 bid BP goal less than 160 Long-term BP goal normotensive   Hyperlipidemia Home meds: None LDL 74, goal < 70 Consider low dose statin at discharge   Dysphagia NPO S/p cortrak On tube feeding at 50cc Free water  100->200 cc every 4h   Other Stroke Risk Factors Obesity, Body mass index is 37.97 kg/m.    Other Active Problems Asthma Tachycardia on coreg , improved   Hospital day # 9  Alan Maiden, MD PGY-1  I have personally obtained history,examined this patient, reviewed notes, independently viewed imaging studies, participated in medical decision making and plan of care.ROS completed by me personally and pertinent positives fully documented  I have made any additions or clarifications directly to the above note. Agree with note above.  Patient remains sedated and intubated for respiratory failure but is arousable and follows some simple commands on the right side.  Continue weaning of ventilatory support as per critical care team.  Plan TEE for tomorrow and extubate after that as tolerated.  Long discussion with wife and mother at the bedside and they both want full support and agreeable with reintubation and trach and PEG if necessary.  Discussed with Dr. Theodoro critical care medicine This patient is critically ill and at significant risk of neurological worsening, death and care requires constant monitoring of vital signs, hemodynamics,respiratory and cardiac monitoring, extensive review of multiple databases,  frequent neurological assessment, discussion with family, other specialists and medical decision making of high complexity.I have made any additions or clarifications directly to the above note.This critical care time does not reflect procedure time, or teaching time or supervisory time of PA/NP/Med Resident etc but could involve care discussion time.  I spent 30 minutes of neurocritical care time  in the care of  this patient.     Eather Popp, MD Medical Director [facility] Pager: [phone] 02/24/2024 3:07 PM  To contact Stroke Continuity provider, please refer to [email or web address]. After hours, contact General Neurology

## 2024-02-24 NOTE — Progress Notes (Addendum)
 STROKE TEAM PROGRESS NOTE    SUBJECTIVE (INTERIM HISTORY)  Her wife and mother are  at bedside.  Patient remains intubated for respiratory failure and is unable to be weaned off ventilatory support.  Eyes are open on examination and dysconjugate, right eye midline in the left eye up and out. Patient is able to follow simple commands, more on the right side than left.  Patient now has more movement in the left upper extremity as compared to yesterday.  Patient has spontaneous movement of lower extremities, with trace withdrawal in the left lower extremity. Blood pressure adequately controlled. She continues to have low-grade fever.  Blood cultures have been negative so far.   TEE planned for tomorrow with Dr. Michele and will plan to extubate after that TCD bubble study done at the bedside negative for right-to-left shunt OBJECTIVE  CBC    Component Value Date/Time   WBC 12.2 (H) 02/24/2024 0612   RBC 2.85 (L) 02/24/2024 0612   HGB 8.2 (L) 02/24/2024 0612   HCT 29.6 (L) 02/24/2024 0612   PLT 404 (H) 02/24/2024 0612   MCV 103.9 (H) 02/24/2024 0612   MCH 28.8 02/24/2024 0612   MCHC 27.7 (L) 02/24/2024 0612   RDW 13.9 02/24/2024 0612   LYMPHSABS 1.0 02/15/2024 0659   MONOABS 0.6 02/15/2024 0659   EOSABS 0.0 02/15/2024 0659   BASOSABS 0.0 02/15/2024 0659    BMET    Component Value Date/Time   NA 158 (H) 02/24/2024 0612   NA 137 05/07/2023 0845   K 4.3 02/24/2024 0612   CL 122 (H) 02/24/2024 0612   CO2 28 02/24/2024 0612   GLUCOSE 137 (H) 02/24/2024 0612   BUN 39 (H) 02/24/2024 0612   BUN 15 05/07/2023 0845   CREATININE 0.97 02/24/2024 0612   CREATININE 0.74 08/19/2023 1015   CALCIUM 8.6 (L) 02/24/2024 0612   EGFR 102 08/19/2023 1015   EGFR 91 05/07/2023 0845   GFRNONAA >60 02/24/2024 0612    IMAGING past 24 hours No results found.  Vitals:   02/24/24 0741 02/24/24 0800 02/24/24 0900 02/24/24 1100  BP:  133/66 (!) 113/57 124/66  Pulse: (!) 110 99 88 94  Resp: 19 18 12 20    Temp: 98.7 F (37.1 C)   99.8 F (37.7 C)  TempSrc: Axillary   Axillary  SpO2: 100% 100% 99% 100%  Weight:      Height:         PHYSICAL EXAM General:  Well-nourished, well-developed middle-aged African-American lady in no acute distress intubated, off sedation CV: Regular rate and rhythm on monitor Respiratory:  Regular, unlabored respirations on vent   NEURO:  Patient remains intubated.  On examination, eyes are open spontaneously and open halfway with stimulation.  Pupils 2 mm bilaterally.  Dysconjugate gaze noted-right eye midline, left eye deviated left  Positive gag and cough reflex is present.  Mild withdrawal to noxious stimuli in RUE, LUE, and RLE; minimal withdraw in LLE. Overall more alert compared to yesterday.   Most Recent NIH  Dizziness Present: No (08/11 1200) Headache Present: No (08/11 1200) Interval: Shift assessment (08/11 0800) Level of Consciousness (1a.)   : Not alert, requires repeated stimulation to attend, or is obtunded and requires strong or painful stimulation to make movements (not stereotyped) (08/11 1200) LOC Questions (1b. )   : Answers neither question correctly (08/11 1200) LOC Commands (1c. )   : Performs both tasks correctly (08/11 1200) Best Gaze (2. )  : Partial gaze palsy (08/11 1200)  Visual (3. )  : Complete hemianopia (08/11 1200) Facial Palsy (4. )    : Normal symmetrical movements (08/11 1200) Motor Arm, Left (5a. )   : No effort against gravity (08/11 1200) Motor Arm, Right (5b. ) : Some effort against gravity (08/11 1200) Motor Leg, Left (6a. )  : No effort against gravity (08/11 1200) Motor Leg, Right (6b. ) : Some effort against gravity (08/11 1200) Limb Ataxia (7. ): Absent (08/11 1200) Sensory (8. )  : Mild-to-moderate sensory loss, patient feels pinprick is less sharp or is dull on the affected side, or there is a loss of superficial pain with pinprick, but patient is aware of being touched (08/11 1200) Best Language (9. )  :  Mute, global aphasia (08/11 1200) Dysarthria (10. ): Intubated or other physical barrier (08/11 1200) Extinction/Inattention (11.)   : Visual/tactile/auditory/spatial/personal inattention (08/11 1200) Complete NIHSS TOTAL: 22 (08/11 1200)   ASSESSMENT/PLAN [name] is a 48 y.o. female with history of interstitial lung disease on CellCept, hypertension, hyperlipidemia, asthma admitted for headache, nausea vomiting and altered mental status. No TNK given due to [facility]. WBC continues to improve on Rocephin . Continues to have overnight fevers. Sodium is continuing to downtrend with 200 mL free water . Continue current management and restart 3% once Na decreases below 153. We will plan to trial extubate tomorrow with TEE planned this week per Dr. Shelah.   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  Nearly occluded right A1 branch.  Occluded proximal M1 branch with reconstitution of M2 branches in the  sylvian fissure via collaterals 2D Echo EF > 75% LDL 74 HgbA1c 5.3 UDS negative Heparin  subcu for VTE prophylaxis No antithrombotic prior to admission, now on No antithrombotic due to ICH Ongoing aggressive stroke risk factor management Therapy recommendations: Pending Disposition: Pending   Cerebral edema Brain herniation CT head 8/2 showed intracranial mass effect with widespread cerebral edema, right uncal herniation, trapped left lateral ventricle, effaced basilar cisterns, possible Duret hemorrhage in the right brainstem Status post hematoma evacuation with Dr. Lanis CT head 8/3 substantial evacuation of right temporal lobe hematoma, decreased right SDH, basilar cistern predominant SAH and decreased mass effect, residual leftward midline shift 4 to 6 mm, stable volume new IVH without ventriculomegaly, right temporal lobe surgical vascular clips in place CT 8/4 Similar parenchymal hemorrhage in the right temporal lobe with surrounding edema and local mass effect, resulting in approximately 5 mm leftward midline shift (previously 6 mm). CT 8/6 stable right temporal lobe hemorrhage with expected evolution of surrounding vasogenic edema. Stable edematous changes and stable  partial effacement of right lateral ventricle. No hydrocephalus.  CT 8/9 stable right temporal lobe hemorrhage with vasogenic edema and encephalomalacia.  Previously seen pneumocephalus has resolved.  Soft tissue swelling and fluid over the right frontotemporal craniotomy continues to improve. Status post 23.4% bolus 8/2 Now on 3% saline @ 75 -> 50-> off Na Q6 with goal 150-155 Na 137--141--140--149-155-159--165--161--160 - 163 -- 158 Continue free water  100->200 cc every 4 hours   Respiratory failure Intubated on sedation Vent management per CCM Still on propofol , off fentanyl  Tracheostomy planned for early this week per Dr. Shelah   ? 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 Interstitial lung disease on  CellCept Fever  S/p Right temporal craniotomy for evacuation of hematoma with clipping of distal RMCA aneurysm  Concerning for mycotic aneurysm, especially that pt is on cellcept with ILD Blood culture NGTD Tmax 101-100.5--100--100.7 Tachycardia improved Leukocytosis WBC 19.4--16.5--12.1 -- 10.8 -- 10.9 On Rocephin    Hypertension Mildly hypertensive, or Norvasc  10 and losartan  100 Off Cleviprex    On amlodipine  10, hydralazine  50 Q6h, losartan  100 daily, coreg  12.5 bid BP goal less than 160 Long-term BP goal normotensive   Hyperlipidemia Home meds: None LDL 74, goal < 70 Consider low dose statin at discharge   Dysphagia NPO S/p cortrak On tube feeding at 50cc Free water  100->200 cc every 4h   Other Stroke Risk Factors Obesity, Body mass index is 37.97 kg/m.    Other Active Problems Asthma Tachycardia on coreg , improved   Hospital day # 9  Alan Maiden, MD PGY-1  I have personally obtained history,examined this patient, reviewed notes, independently viewed imaging studies, participated in medical decision making and plan of care.ROS completed by me personally and pertinent positives fully documented  I have made any additions or clarifications directly to the above note. Agree with note above.  Patient remains sedated and intubated for respiratory failure but is arousable and follows some simple commands on the right side.  Continue weaning of ventilatory support as per critical care team.  Plan TEE for tomorrow and extubate after that as tolerated.  Long discussion with wife and mother at the bedside and they both want full support and agreeable with reintubation and trach and PEG if necessary.  Discussed with Dr. Theodoro critical care medicine This patient is critically ill and at significant risk of neurological worsening, death and care requires constant monitoring of vital signs, hemodynamics,respiratory and cardiac monitoring, extensive review of multiple databases,  frequent neurological assessment, discussion with family, other specialists and medical decision making of high complexity.I have made any additions or clarifications directly to the above note.This critical care time does not reflect procedure time, or teaching time or supervisory time of PA/NP/Med Resident etc but could involve care discussion time.  I spent 30 minutes of neurocritical care time  in the care of  this patient.     Eather Popp, MD Medical Director [facility] Pager: [phone] 02/24/2024 3:07 PM  To contact Stroke Continuity provider, please refer to [email or web address]. After hours, contact General Neurology

## 2024-02-25 ENCOUNTER — Encounter (HOSPITAL_COMMUNITY)
Admission: EM | Disposition: A | Discharge status: Nursing Home (Includes ICF) | Payer: Self-pay | Source: Home / Self Care | Attending: Internal Medicine

## 2024-02-25 ENCOUNTER — Inpatient Hospital Stay (HOSPITAL_COMMUNITY): Admitting: Anesthesiology

## 2024-02-25 ENCOUNTER — Inpatient Hospital Stay (HOSPITAL_COMMUNITY)

## 2024-02-25 DIAGNOSIS — I639 Cerebral infarction, unspecified: Secondary | ICD-10-CM

## 2024-02-25 DIAGNOSIS — R29722 NIHSS score 22: Secondary | ICD-10-CM | POA: Diagnosis not present

## 2024-02-25 DIAGNOSIS — R6510 Systemic inflammatory response syndrome (SIRS) of non-infectious origin without acute organ dysfunction: Secondary | ICD-10-CM

## 2024-02-25 DIAGNOSIS — I611 Nontraumatic intracerebral hemorrhage in hemisphere, cortical: Secondary | ICD-10-CM | POA: Diagnosis not present

## 2024-02-25 DIAGNOSIS — I609 Nontraumatic subarachnoid hemorrhage, unspecified: Secondary | ICD-10-CM | POA: Diagnosis not present

## 2024-02-25 DIAGNOSIS — I6200 Nontraumatic subdural hemorrhage, unspecified: Secondary | ICD-10-CM | POA: Diagnosis not present

## 2024-02-25 LAB — GLUCOSE, CAPILLARY
Glucose-Capillary: 101 mg/dL — ABNORMAL HIGH (ref 70–99)
Glucose-Capillary: 105 mg/dL — ABNORMAL HIGH (ref 70–99)
Glucose-Capillary: 116 mg/dL — ABNORMAL HIGH (ref 70–99)
Glucose-Capillary: 124 mg/dL — ABNORMAL HIGH (ref 70–99)
Glucose-Capillary: 130 mg/dL — ABNORMAL HIGH (ref 70–99)
Glucose-Capillary: 98 mg/dL (ref 70–99)

## 2024-02-25 LAB — POCT I-STAT 7, (LYTES, BLD GAS, ICA,H+H)
Acid-Base Excess: 4 mmol/L — ABNORMAL HIGH (ref 0.0–2.0)
Bicarbonate: 29.2 mmol/L — ABNORMAL HIGH (ref 20.0–28.0)
Calcium, Ion: 1.24 mmol/L (ref 1.15–1.40)
HCT: 27 % — ABNORMAL LOW (ref 36.0–46.0)
Hemoglobin: 9.2 g/dL — ABNORMAL LOW (ref 12.0–15.0)
O2 Saturation: 99 %
Patient temperature: 99.6
Potassium: 4.6 mmol/L (ref 3.5–5.1)
Sodium: 159 mmol/L — ABNORMAL HIGH (ref 135–145)
TCO2: 31 mmol/L (ref 22–32)
pCO2 arterial: 47.5 mmHg (ref 32–48)
pH, Arterial: 7.399 (ref 7.35–7.45)
pO2, Arterial: 144 mmHg — ABNORMAL HIGH (ref 83–108)

## 2024-02-25 LAB — BASIC METABOLIC PANEL WITH GFR
Anion gap: 9 (ref 5–15)
BUN: 39 mg/dL — ABNORMAL HIGH (ref 6–20)
CO2: 26 mmol/L (ref 22–32)
Calcium: 9 mg/dL (ref 8.9–10.3)
Chloride: 122 mmol/L — ABNORMAL HIGH (ref 98–111)
Creatinine, Ser: 1.06 mg/dL — ABNORMAL HIGH (ref 0.44–1.00)
GFR, Estimated: >60 mL/min (ref >60)
Glucose, Bld: 126 mg/dL — ABNORMAL HIGH (ref 70–99)
Potassium: 4.6 mmol/L (ref 3.5–5.1)
Sodium: 157 mmol/L — ABNORMAL HIGH (ref 135–145)

## 2024-02-25 LAB — ECHO TEE

## 2024-02-25 SURGERY — TRANSESOPHAGEAL ECHOCARDIOGRAM (TEE) (CATHLAB)
Anesthesia: Monitor Anesthesia Care

## 2024-02-25 MED ORDER — FENTANYL CITRATE PF 50 MCG/ML IJ SOSY
25.0000 ug | PREFILLED_SYRINGE | Freq: Once | INTRAMUSCULAR | Status: DC
  Filled 2024-02-25: qty 1

## 2024-02-25 MED ORDER — SODIUM CHLORIDE 0.9 % IV BOLUS: 500.0000 mL | Freq: Once | INTRAVENOUS | Status: DC

## 2024-02-25 MED ORDER — PROPOFOL 1000 MG/100ML IV EMUL
0.0000 ug/kg/min | INTRAVENOUS | Status: DC
  Administered 2024-02-25 (×2): 10 ug/kg/min via INTRAVENOUS
  Filled 2024-02-25: qty 100

## 2024-02-25 MED ORDER — FENTANYL CITRATE PF 50 MCG/ML IJ SOSY
25.0000 ug | PREFILLED_SYRINGE | Freq: Once | INTRAMUSCULAR | Status: AC | PRN
  Administered 2024-02-25 (×2): 25 ug via INTRAVENOUS

## 2024-02-25 MED ORDER — SODIUM CHLORIDE 0.9 % IV SOLN: INTRAVENOUS | Status: DC

## 2024-02-25 NOTE — Progress Notes (Signed)
 PICC Removal Note: PICC line removed from RUE. PICC catheter tip visualized and intact. Pressure dressing applied. No redness, ecchymosis, edema, swelling, or drainage noted at site. Instructions provided on post PICC discharge care, including followup notification instructions.  Bedrest until 1830.

## 2024-02-25 NOTE — Progress Notes (Addendum)
 STROKE TEAM PROGRESS NOTE    SUBJECTIVE (INTERIM HISTORY)  Her wife and mother are  at bedside.  Patient remains intubated for respiratory failure and is unable to be weaned off ventilatory support.  Eyes are open on examination and dysconjugate, right eye midline in the left eye up and out. Patient is able to follow simple commands, more on the right side than left.  TEE done today shows no vegetation or obvious cardiac source of embolism.  Possible small PFO but unlikely to be of clinical significance as TCD bubble study was negative OBJECTIVE  CBC    Component Value Date/Time   WBC 12.2 (H) 02/24/2024 0612   RBC 2.85 (L) 02/24/2024 0612   HGB 8.2 (L) 02/24/2024 0612   HCT 29.6 (L) 02/24/2024 0612   PLT 404 (H) 02/24/2024 0612   MCV 103.9 (H) 02/24/2024 0612   MCH 28.8 02/24/2024 0612   MCHC 27.7 (L) 02/24/2024 0612   RDW 13.9 02/24/2024 0612   LYMPHSABS 1.0 02/15/2024 0659   MONOABS 0.6 02/15/2024 0659   EOSABS 0.0 02/15/2024 0659   BASOSABS 0.0 02/15/2024 0659    BMET    Component Value Date/Time   NA 158 (H) 02/24/2024 0612   NA 137 05/07/2023 0845   K 4.3 02/24/2024 0612   CL 122 (H) 02/24/2024 0612   CO2 28 02/24/2024 0612   GLUCOSE 137 (H) 02/24/2024 0612   BUN 39 (H) 02/24/2024 0612   BUN 15 05/07/2023 0845   CREATININE 0.97 02/24/2024 0612   CREATININE 0.74 08/19/2023 1015   CALCIUM 8.6 (L) 02/24/2024 0612   EGFR 102 08/19/2023 1015   EGFR 91 05/07/2023 0845   GFRNONAA >60 02/24/2024 0612    IMAGING past 24 hours ECHO TEE Result Date: 02/25/2024    TRANSESOPHOGEAL ECHO REPORT   Patient Name:   Cynara Tatham Date of Exam: 02/25/2024 Medical Rec #:  [id number]        Height:       67.0 in Accession #:    [id number]       Weight:       268.7 lb Date of Birth:  Jan 02, 1976        BSA:          2.293 m Patient Age:    48 years         BP:           131/70 mmHg Patient Gender: F                HR:           122 bpm. Exam Location:  Inpatient Procedure:  Transesophageal Echo, Cardiac Doppler and Color Doppler (Both            Spectral and Color Flow Doppler were utilized during procedure). Indications:     Stroke  History:         Patient has prior history of Echocardiogram examinations, most                  recent 02/17/2024.  Sonographer:     Tinnie Gosling RDCS Referring Phys:  Eather Popp MD NATALIA BIRMINGHAM, A Diagnosing Phys: Madonna Large PROCEDURE: After discussion of the risks and benefits of a TEE, an informed consent was obtained Spouse. The transesophogeal probe was passed without difficulty through the esophogus of the patient. Imaged were obtained with the patient in a supine position. Sedation performed by different physician. Image quality was adequate. The patient's vital signs; including heart rate,  blood pressure, and oxygen  saturation; remained stable throughout the procedure. Supplementary images were obtained from transthoracic windows as indicated to answer the clinical question. The patient developed no complications during the procedure. Patient was intubated in NeuroICU and sedation as per ICU physician.  IMPRESSIONS  1. Left ventricular ejection fraction, by estimation, is 60 to 65%. The left ventricle has normal function. The left ventricle has no regional wall motion abnormalities. Left ventricular diastolic function could not be evaluated.  2. Right ventricular systolic function is normal. The right ventricular size is normal.  3. No left atrial/left atrial appendage thrombus was detected.  4. The mitral valve is normal in structure. No evidence of mitral valve regurgitation. No evidence of mitral stenosis.  5. The aortic valve is normal in structure. Aortic valve regurgitation is not visualized. No aortic stenosis is present.  6. There is mild (Grade II) layered plaque involving the descending aorta.  7. Agitated saline contrast bubble study was equivocal. Small channel noted at the IAS. Cannot entirely rule out a small PFO based on color  doppler and bubble study. Could consider a repeat study for further evaluation once extubated or different imaging modality based on clinical trajectory. Conclusion(s)/Recommendation(s): No evidence of vegetation/infective endocarditis on this transesophageael echocardiogram. Cannot entirely rule out a small PFO based on color doppler and bubble study. Could consider a repeat study for further evaluation once extubated or different imaging modality based on clinical trajectory. FINDINGS  Left Ventricle: Left ventricular ejection fraction, by estimation, is 60 to 65%. The left ventricle has normal function. The left ventricle has no regional wall motion abnormalities. The left ventricular internal cavity size was normal in size. There is  no left ventricular hypertrophy. Left ventricular diastolic function could not be evaluated due to nondiagnostic images. Left ventricular diastolic function could not be evaluated. Right Ventricle: The right ventricular size is normal. No increase in right ventricular wall thickness. Right ventricular systolic function is normal. Left Atrium: Left atrial size was normal in size. No left atrial/left atrial appendage thrombus was detected. Right Atrium: Right atrial size was normal in size. Pericardium: There is no evidence of pericardial effusion. Mitral Valve: The mitral valve is normal in structure. No evidence of mitral valve regurgitation. No evidence of mitral valve stenosis. Tricuspid Valve: The tricuspid valve is normal in structure. Tricuspid valve regurgitation is not demonstrated. No evidence of tricuspid stenosis. Aortic Valve: The aortic valve is normal in structure. Aortic valve regurgitation is not visualized. No aortic stenosis is present. There is no evidence of aortic valve vegetation. Pulmonic Valve: The pulmonic valve was normal in structure. Pulmonic valve regurgitation is not visualized. No evidence of pulmonic stenosis. Aorta: The aortic root and ascending aorta  are structurally normal, with no evidence of dilitation. There is mild (Grade II) layered plaque involving the descending aorta. IAS/Shunts: There is redundancy of the interatrial septum. Agitated saline contrast was given intravenously to evaluate for intracardiac shunting. Agitated saline contrast bubble study was equivocal. Small channel noted at the IAS. Cannot entirely rule out a small PFO based on color doppler and bubble study. Could consider a repeat study for further evaluation once extubated or different imaging modality based on clinical trajectory. Additional Comments: A venous catheter is visualized. Spectral Doppler performed. LEFT VENTRICLE PLAX 2D LVOT diam:     1.70 cm LVOT Area:     2.27 cm   AORTA Ao Root diam: 2.90 cm Ao Asc diam:  3.20 cm  SHUNTS Systemic Diam: 1.70 cm Sunit Tolia  Electronically signed by Madonna Large Signature Date/Time: 02/25/2024/12:24:10 PM    Final     Vitals:   02/25/24 1530 02/25/24 1549 02/25/24 1600 02/25/24 1630  BP: 124/67  114/63 115/62  Pulse: (!) 113 (!) 120 (!) 119 (!) 117  Resp: (!) 22 (!) 27 (!) 29 (!) 26  Temp:   99.6 F (37.6 C)   TempSrc:   Axillary   SpO2: 97% 98% 98% 100%  Weight:      Height:         PHYSICAL EXAM General:  Well-nourished, well-developed middle-aged African-American lady in no acute distress intubated, off sedation CV: Regular rate and rhythm on monitor Respiratory:  Regular, unlabored respirations on vent   NEURO:  Patient remains intubated.  On examination, eyes are open spontaneously and open halfway with stimulation.  Pupils 2 mm bilaterally.  Dysconjugate gaze noted-right eye midline, left eye deviated left  Positive gag and cough reflex is present.  Mild withdrawal to noxious stimuli in RUE, LUE, and RLE; minimal withdraw in LLE. Overall more alert compared to yesterday.   Most Recent NIH  Dizziness Present: No (08/12 1600) Headache Present: No (08/12 1600) Interval: Shift assessment (08/12 0800) Level of  Consciousness (1a.)   : Not alert, requires repeated stimulation to attend, or is obtunded and requires strong or painful stimulation to make movements (not stereotyped) (08/12 1600) LOC Questions (1b. )   : Answers neither question correctly (08/12 1600) LOC Commands (1c. )   : Performs both tasks correctly (08/12 1600) Best Gaze (2. )  : Partial gaze palsy (08/12 1600) Visual (3. )  : Complete hemianopia (08/12 1600) Facial Palsy (4. )    : Normal symmetrical movements (08/12 1600) Motor Arm, Left (5a. )   : No effort against gravity (08/12 1600) Motor Arm, Right (5b. ) : Some effort against gravity (08/12 1600) Motor Leg, Left (6a. )  : No effort against gravity (08/12 1600) Motor Leg, Right (6b. ) : Some effort against gravity (08/12 1600) Limb Ataxia (7. ): Absent (08/12 1600) Sensory (8. )  : Mild-to-moderate sensory loss, patient feels pinprick is less sharp or is dull on the affected side, or there is a loss of superficial pain with pinprick, but patient is aware of being touched (08/12 1600) Best Language (9. )  : Mute, global aphasia (08/12 1600) Dysarthria (10. ): Intubated or other physical barrier (08/12 1600) Extinction/Inattention (11.)   : Visual/tactile/auditory/spatial/personal inattention (08/12 1600) Complete NIHSS TOTAL: 22 (08/12 1600)   ASSESSMENT/PLAN Ms. Eiko Mcgowen is a 48 y.o. female with history of interstitial lung disease on CellCept, hypertension, hyperlipidemia, asthma admitted for headache, nausea vomiting and altered mental status. No TNK given due to [facility]. WBC continues to improve on Rocephin . Continues to have overnight fevers. Sodium is continuing to downtrend with 200 mL free water . Continue current management and restart 3% once Na decreases below 153. We will plan to trial extubate tomorrow with TEE planned this week per Dr. Shelah.   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  Nearly occluded right A1 branch.  Occluded proximal M1 branch with reconstitution of M2 branches in the sylvian fissure via collaterals 2D Echo EF > 75% LDL 74 HgbA1c 5.3 UDS negative Heparin  subcu for VTE prophylaxis No antithrombotic prior to admission, now on No antithrombotic due to ICH Ongoing aggressive stroke risk factor management Therapy recommendations: Pending Disposition: Pending   Cerebral edema Brain herniation CT head 8/2 showed intracranial mass effect with widespread cerebral edema, right uncal herniation, trapped left lateral ventricle, effaced basilar cisterns, possible Duret hemorrhage in the right brainstem Status post hematoma evacuation with Dr. Lanis CT head 8/3 substantial evacuation of right temporal lobe hematoma, decreased right SDH, basilar cistern predominant SAH and decreased mass effect, residual leftward midline shift 4 to 6 mm, stable volume new IVH without ventriculomegaly, right temporal lobe surgical vascular clips in place CT 8/4  Similar parenchymal hemorrhage in the right temporal lobe with surrounding edema and local mass effect, resulting in approximately 5 mm leftward midline shift (previously 6 mm). CT 8/6 stable right temporal lobe hemorrhage with expected evolution of surrounding vasogenic edema. Stable edematous changes and stable partial effacement of right lateral ventricle. No hydrocephalus.  CT 8/9 stable right temporal lobe hemorrhage with vasogenic edema and encephalomalacia.  Previously seen pneumocephalus has resolved.  Soft tissue swelling and fluid over the right frontotemporal craniotomy continues to improve. Status post 23.4% bolus 8/2 Now on 3% saline @ 75 -> 50-> off Na Q6 with goal 150-155 Na 137--141--140--149-155-159--165--161--160 - 163 -- 158 Continue free water  100->200 cc every 4 hours   Respiratory failure Intubated on sedation Vent management per CCM Still on propofol , off fentanyl  Tracheostomy planned for early this week per Dr. Shelah   ? 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, especially that pt is on cellcept with ILD Blood culture NGTD Tmax 101-100.5--100--100.7 Tachycardia improved Leukocytosis WBC 19.4--16.5--12.1 -- 10.8 -- 10.9 On Rocephin    Hypertension Mildly hypertensive, or Norvasc  10 and losartan  100 Off Cleviprex    On amlodipine  10, hydralazine  50 Q6h, losartan  100 daily, coreg  12.5 bid BP goal less than 160 Long-term BP goal normotensive   Hyperlipidemia Home meds: None LDL 74, goal < 70 Consider low dose statin at discharge   Dysphagia NPO S/p cortrak On tube feeding at 50cc Free water  100->200 cc every 4h   Other Stroke Risk Factors Obesity, Body mass index is 37.97 kg/m.    Other Active Problems Asthma Tachycardia on coreg , improved   Hospital day # 10   Patient  remains sedated and intubated for respiratory failure but is arousable and follows some simple commands on the right side.  Continue weaning of ventilatory support as per critical care team.  TEE shows possibly a small PFO but unlikely to have clinical significance.  No vegetation or cardiac source of embolism noted.  Plan to extubate as tolerated per CCM.SABRA  Long discussion with  wife and mother at the bedside and they both want full support and agreeable with reintubation and trach and PEG if necessary.  Discussed with Dr. Theodoro critical care medicine and Dr. Michele cardiology This patient is critically ill and at significant risk of neurological worsening, death and care requires constant monitoring of vital signs, hemodynamics,respiratory and cardiac monitoring, extensive review of multiple databases, frequent neurological assessment, discussion with family, other specialists and medical decision making of high complexity.I have made any additions or clarifications directly to the above note.This critical care time does not reflect procedure time, or teaching time or supervisory time of PA/NP/Med Resident etc but could involve care discussion time.  I spent 30 minutes of neurocritical care time  in the care of  this patient.         Eather Popp, MD Medical Director [facility] Pager: [phone] 02/25/2024 4:42 PM  To contact Stroke Continuity provider, please refer to [email or web address]. After hours, contact General Neurology

## 2024-02-25 NOTE — Progress Notes (Signed)
 NAME:  Katie Knight, MRN:  [id number], DOB:  10-20-1975, LOS: 10 ADMISSION DATE:  02/15/2024, CONSULTATION DATE:  02/15/2024 REFERRING MD:  Dr. 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  Stroke code stroke CTA showed large intraparenchymal hemorrhage on the right side with some subdural subarachnoid hemorrhage as well.  Neurosurgery and neurology consulted for further management.  PCCM consulted for admission.  On ED arrival patient was seen mildly hypothermic, bradycardic, and hypertensive.  Lab work significant for potassium 3.3, glucose 216, hemoglobin 11 point.   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 Taken for crani and evacuation. Clipping of distal RMCA aneurysm  8/3 - Agitated overnight. BP remains at upper limit of goal.2L positive.Tmax 100.1. Tolerating SBT 8/6 repeat head CT with stable right temporal lobe hemorrhage with expected evolution of surrounding vasogenic edema and stable edematous changes and probable infarct in the posterior limb of the internal capsule 8/7 no acute issues overnight 8/9 head CT >> right temporal parenchymal  hemorrhage 4.1 cm stable in size.  No significant change vasogenic edema, encephalomalacia of the right temporal lobe.  Previously seen pneumocephalus has resolved 8/11: improved mentation follwing commands but still drowsy. Overnight fevers.  8/12: TEE today.  Interim History / Subjective:  Mental status improved.  Following commands.  Objective    Blood pressure 133/63, pulse 86, temperature 99.4 F (37.4 C), temperature source Axillary, resp. rate 17, height 5' 7.01 (1.702 m), weight 121.9 kg, SpO2 100%.    Vent Mode: PSV;CPAP FiO2 (%):  [40 %] 40 % Set Rate:  [20 bmp] 20 bmp Vt Set:  [500 mL] 500 mL PEEP:  [5 cmH20] 5 cmH20 Pressure Support:  [5 cmH20] 5 cmH20 Plateau Pressure:  [19 cmH20-20 cmH20] 20 cmH20   Intake/Output Summary (Last 24 hours) at 02/25/2024 1012 Last data filed at 02/25/2024 0800 Gross per 24 hour  Intake 2066.67 ml  Output 1600 ml  Net 466.67 ml   Filed Weights   02/18/24 0723 02/22/24 0500 02/23/24 0400  Weight: 110 kg 123 kg 121.9 kg    Examination:  General: Obese young woman, no distress off sedation HEENT: ET tube in good position, oropharynx moist Neuro: IRUL strength 4/5. RLE 3/5. No movement on on the left.  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  Will give her a trial at extubation.  Tolerated pressure support trial well yesterday.  Will place on pressure support trial after TEE today. - off sedation for >48 hrs.  -Pulmonary hygiene -VAP prevention orders - CellCept is on hold in the setting of critical illness.  Will consider resuming it tomorrow. - 7-day course empiric ceftriaxone  completed .  Cultures negative.  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 - TEE per neurology recs based on the anatomy of the aneurysm.  Therapeutic Hypernatermia Goal Na per neuro 150-155.  Off 3% saline. -FWF 200 q4h.   Anemia of critical illness -Following CBC - Slowly decreasing trend of hemoglobin.  Monitor.  Transfuse to keep greater than 7.  Hyperglycemia - Unknown history of diabetes with last hemoglobin A1c October 2024 was 5.7 -Moderate sliding scale insulin  protocol  HTN: - Continue amlodipine , Coreg , losartan .   Hypertension Hyperlipidemia -Appears patient is on no medications at baseline for hypertension P:  - Continue carvedilol , hydralazine , losartan  - SBP goal <160  Constipation P: - Continue senna, Colace, MiraLAX , Dulcolax suppository - Enema if needed   Best Practice (right click and Reselect all SmartList Selections daily)   Diet/type: tubefeeds.  DVT prophylaxis.  Heparin  and SCD. Pressure ulcer(s): N/A GI prophylaxis: H2B Lines: N/A Foley:  Yes, and it is still needed Code Status:  full code  Last date of multidisciplinary goals of care discussion: Discussed with partner and mother about possible trial at extubation today after the TEE.  CRITICAL CARE  Total critical care time: 40 minutes  Critical care time was exclusive of separately billable procedures and treating other patients.  Critical care was necessary to treat or prevent  imminent or life-threatening deterioration.  Critical care was time spent personally by me on the following activities: development of treatment plan with patient and/or surrogate as well as nursing, discussions with consultants, evaluation of patient's response to treatment, examination of patient, obtaining history from patient or surrogate, ordering and performing treatments and interventions, ordering and review of laboratory studies, ordering and review of radiographic studies, pulse oximetry and re-evaluation of patient's condition.   Sammi Fredericks, MD.  02/25/2024, 10:12 AM

## 2024-02-25 NOTE — TOC CM/SW Note (Signed)
 02-25-2024   RENEE @ [employer]  : CONTACT FOR D/C PLANNING NEEDS AND  HOME HEALTH, DME. PHONE #  [phone]

## 2024-02-25 NOTE — Progress Notes (Signed)
 Successfully extubated and was doing well post extubation. However, around 6.30 pm when I came to reevaluate her she was more tachypneic. Coarse BS. Will get CXR and ABG.   ECHO normal.   Awaiting CXR. If concern for congestion will give a dose of lasix.   Sammi Fredericks, MD.

## 2024-02-25 NOTE — Interval H&P Note (Signed)
 History and Physical Interval Note:  02/25/2024 9:58 AM  [name]  has presented today for surgery, with the diagnosis of stroke.  The various methods of treatment have been discussed with the patient and family. After consideration of risks, benefits and other options for treatment, the patient has consented to  Procedure(s): TRANSESOPHAGEAL ECHOCARDIOGRAM (N/A) as a surgical intervention.  The patient's history has been reviewed, patient examined, no change in status, stable for surgery.  I have reviewed the patient's chart and labs.  Questions were answered to the patient's satisfaction.    Informed Consent   Shared Decision Making/Informed Consent   The risks [esophageal damage, perforation (1:10,000 risk), bleeding, pharyngeal hematoma as well as other potential complications associated with conscious sedation including aspiration, arrhythmia, respiratory failure and death], benefits (treatment guidance and diagnostic support) and alternatives of a transesophageal echocardiogram were discussed in detail with [name] and she is willing to proceed.      Contact person: Spoke to her spouse.   Madonna Michele HAS, [facility]  [facility]  A Division of [facility] [address], [place], [place] [address]  [place],  [id number]

## 2024-02-25 NOTE — Progress Notes (Signed)
 STROKE TEAM PROGRESS NOTE    SUBJECTIVE (INTERIM HISTORY)  Her wife and mother are  at bedside.  Patient remains intubated for respiratory failure and is unable to be weaned off ventilatory support.  Neurological exam similar to yesterday, patient able to follow simple commands, more movement on right side than left, and continued spontaneous movements in lower extremities with decreased withdrawal in the left lower extremity.  Patient has been off sedation for over 48 hours.  Blood pressure adequately controlled.  TCD bubble study done at bedside negative for right-to-left shunt.  Patient underwent TEE today which noted a small channel at the IAS.     OBJECTIVE  CBC    Component Value Date/Time   WBC 12.2 (H) 02/24/2024 0612   RBC 2.85 (L) 02/24/2024 0612   HGB 8.2 (L) 02/24/2024 0612   HCT 29.6 (L) 02/24/2024 0612   PLT 404 (H) 02/24/2024 0612   MCV 103.9 (H) 02/24/2024 0612   MCH 28.8 02/24/2024 0612   MCHC 27.7 (L) 02/24/2024 0612   RDW 13.9 02/24/2024 0612   LYMPHSABS 1.0 02/15/2024 0659   MONOABS 0.6 02/15/2024 0659   EOSABS 0.0 02/15/2024 0659   BASOSABS 0.0 02/15/2024 0659    BMET    Component Value Date/Time   NA 158 (H) 02/24/2024 0612   NA 137 05/07/2023 0845   K 4.3 02/24/2024 0612   CL 122 (H) 02/24/2024 0612   CO2 28 02/24/2024 0612   GLUCOSE 137 (H) 02/24/2024 0612   BUN 39 (H) 02/24/2024 0612   BUN 15 05/07/2023 0845   CREATININE 0.97 02/24/2024 0612   CREATININE 0.74 08/19/2023 1015   CALCIUM 8.6 (L) 02/24/2024 0612   EGFR 102 08/19/2023 1015   EGFR 91 05/07/2023 0845   GFRNONAA >60 02/24/2024 0612    IMAGING past 24 hours ECHO TEE Result Date: 02/25/2024    TRANSESOPHOGEAL ECHO REPORT   Patient Name:   Madyson Lukach Date of Exam: 02/25/2024 Medical Rec #:  [id number]        Height:       67.0 in Accession #:    [id number]       Weight:       268.7 lb Date of Birth:  Dec 06, 1975        BSA:          2.293 m Patient Age:    48 years         BP:            131/70 mmHg Patient Gender: F                HR:           122 bpm. Exam Location:  Inpatient Procedure: Transesophageal Echo, Cardiac Doppler and Color Doppler (Both            Spectral and Color Flow Doppler were utilized during procedure). Indications:     Stroke  History:         Patient has prior history of Echocardiogram examinations, most                  recent 02/17/2024.  Sonographer:     Tinnie Gosling RDCS Referring Phys:  Eather Popp MD NATALIA BIRMINGHAM, A Diagnosing Phys: Madonna Large PROCEDURE: After discussion of the risks and benefits of a TEE, an informed consent was obtained Spouse. The transesophogeal probe was passed without difficulty through the esophogus of the patient. Imaged were obtained with the patient in a supine position. Sedation  performed by different physician. Image quality was adequate. The patient's vital signs; including heart rate, blood pressure, and oxygen  saturation; remained stable throughout the procedure. Supplementary images were obtained from transthoracic windows as indicated to answer the clinical question. The patient developed no complications during the procedure. Patient was intubated in NeuroICU and sedation as per ICU physician.  IMPRESSIONS  1. Left ventricular ejection fraction, by estimation, is 60 to 65%. The left ventricle has normal function. The left ventricle has no regional wall motion abnormalities. Left ventricular diastolic function could not be evaluated.  2. Right ventricular systolic function is normal. The right ventricular size is normal.  3. No left atrial/left atrial appendage thrombus was detected.  4. The mitral valve is normal in structure. No evidence of mitral valve regurgitation. No evidence of mitral stenosis.  5. The aortic valve is normal in structure. Aortic valve regurgitation is not visualized. No aortic stenosis is present.  6. There is mild (Grade II) layered plaque involving the descending aorta.  7. Agitated saline contrast  bubble study was equivocal. Small channel noted at the IAS. Cannot entirely rule out a small PFO based on color doppler and bubble study. Could consider a repeat study for further evaluation once extubated or different imaging modality based on clinical trajectory. Conclusion(s)/Recommendation(s): No evidence of vegetation/infective endocarditis on this transesophageael echocardiogram. Cannot entirely rule out a small PFO based on color doppler and bubble study. Could consider a repeat study for further evaluation once extubated or different imaging modality based on clinical trajectory. FINDINGS  Left Ventricle: Left ventricular ejection fraction, by estimation, is 60 to 65%. The left ventricle has normal function. The left ventricle has no regional wall motion abnormalities. The left ventricular internal cavity size was normal in size. There is  no left ventricular hypertrophy. Left ventricular diastolic function could not be evaluated due to nondiagnostic images. Left ventricular diastolic function could not be evaluated. Right Ventricle: The right ventricular size is normal. No increase in right ventricular wall thickness. Right ventricular systolic function is normal. Left Atrium: Left atrial size was normal in size. No left atrial/left atrial appendage thrombus was detected. Right Atrium: Right atrial size was normal in size. Pericardium: There is no evidence of pericardial effusion. Mitral Valve: The mitral valve is normal in structure. No evidence of mitral valve regurgitation. No evidence of mitral valve stenosis. Tricuspid Valve: The tricuspid valve is normal in structure. Tricuspid valve regurgitation is not demonstrated. No evidence of tricuspid stenosis. Aortic Valve: The aortic valve is normal in structure. Aortic valve regurgitation is not visualized. No aortic stenosis is present. There is no evidence of aortic valve vegetation. Pulmonic Valve: The pulmonic valve was normal in structure. Pulmonic  valve regurgitation is not visualized. No evidence of pulmonic stenosis. Aorta: The aortic root and ascending aorta are structurally normal, with no evidence of dilitation. There is mild (Grade II) layered plaque involving the descending aorta. IAS/Shunts: There is redundancy of the interatrial septum. Agitated saline contrast was given intravenously to evaluate for intracardiac shunting. Agitated saline contrast bubble study was equivocal. Small channel noted at the IAS. Cannot entirely rule out a small PFO based on color doppler and bubble study. Could consider a repeat study for further evaluation once extubated or different imaging modality based on clinical trajectory. Additional Comments: A venous catheter is visualized. Spectral Doppler performed. LEFT VENTRICLE PLAX 2D LVOT diam:     1.70 cm LVOT Area:     2.27 cm   AORTA Ao Root diam: 2.90  cm Ao Asc diam:  3.20 cm  SHUNTS Systemic Diam: 1.70 cm Sunit Tolia Electronically signed by Madonna Large Signature Date/Time: 02/25/2024/12:24:10 PM    Final    VAS US  LOWER EXTREMITY VENOUS (DVT) Result Date: 02/24/2024  Lower Venous DVT Study Patient Name:  DEDRA MATSUO  Date of Exam:   02/24/2024 Medical Rec #: [id number]         Accession #:    [id number] Date of Birth: Jul 06, 1976         Patient Gender: F Patient Age:   40 years Exam Location:  [facility] Procedure:      VAS US  LOWER EXTREMITY VENOUS (DVT) Referring Phys: KARNA GERALDS --------------------------------------------------------------------------------  Indications: Stroke.  Limitations: Body habitus and ventilation, patient positioned partially on right side. Comparison Study: No prior study on file Performing Technologist: Alberta Lis RVS  Examination Guidelines: A complete evaluation includes B-mode imaging, spectral Doppler, color Doppler, and power Doppler as needed of all accessible portions of each vessel. Bilateral testing is considered an integral part of a complete examination.  Limited examinations for reoccurring indications may be performed as noted. The reflux portion of the exam is performed with the patient in reverse Trendelenburg.  +---------+---------------+---------+-----------+----------+-------------------+ RIGHT    CompressibilityPhasicitySpontaneityPropertiesThrombus Aging      +---------+---------------+---------+-----------+----------+-------------------+ CFV      Full           Yes      No                                       +---------+---------------+---------+-----------+----------+-------------------+ SFJ      Full                                                             +---------+---------------+---------+-----------+----------+-------------------+ FV Prox  Full                                                             +---------+---------------+---------+-----------+----------+-------------------+ FV Mid   Full                                                             +---------+---------------+---------+-----------+----------+-------------------+ FV DistalFull                                                             +---------+---------------+---------+-----------+----------+-------------------+ PFV      Full                                                             +---------+---------------+---------+-----------+----------+-------------------+  POP      Full           Yes      Yes                                      +---------+---------------+---------+-----------+----------+-------------------+ PTV                                                   Not well visualized +---------+---------------+---------+-----------+----------+-------------------+ PERO                                                  Not well visualized +---------+---------------+---------+-----------+----------+-------------------+   +---------+---------------+---------+-----------+----------+-------------------+  LEFT     CompressibilityPhasicitySpontaneityPropertiesThrombus Aging      +---------+---------------+---------+-----------+----------+-------------------+ CFV      Full           Yes      No                                       +---------+---------------+---------+-----------+----------+-------------------+ SFJ      Full                                                             +---------+---------------+---------+-----------+----------+-------------------+ FV Prox  Full                                                             +---------+---------------+---------+-----------+----------+-------------------+ FV Mid   Full                                                             +---------+---------------+---------+-----------+----------+-------------------+ FV DistalFull                                                             +---------+---------------+---------+-----------+----------+-------------------+ PFV      Full                                                             +---------+---------------+---------+-----------+----------+-------------------+ POP      Full           Yes  No                                       +---------+---------------+---------+-----------+----------+-------------------+ PTV                                                   Not well visualized +---------+---------------+---------+-----------+----------+-------------------+ PERO                                                  Not well visualized +---------+---------------+---------+-----------+----------+-------------------+     Summary: RIGHT: - There is no evidence of deep vein thrombosis in the lower extremity.  LEFT: - There is no evidence of deep vein thrombosis in the lower extremity.  *See table(s) above for measurements and observations. Electronically signed by Fonda Rim on 02/24/2024 at 6:55:07 PM.    Final    VAS US  TRANSCRANIAL  DOPPLER W BUBBLES Result Date: 02/24/2024  Transcranial Doppler with Bubble Patient Name:  VICTORIA HENSHAW  Date of Exam:   02/24/2024 Medical Rec #: [id number]         Accession #:    [id number] Date of Birth: 1975/07/21         Patient Gender: F Patient Age:   71 years Exam Location:  [facility] Procedure:      VAS US  TRANSCRANIAL DOPPLER W BUBBLES Referring Phys: KARNA GERALDS --------------------------------------------------------------------------------  Indications: Stroke. 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 Comparison Study: No prior TCD Performing Technologist: Alberta Lis RVS  Examination Guidelines: A complete evaluation includes B-mode imaging, spectral Doppler, color Doppler, and power Doppler as needed of all accessible portions of each vessel. Bilateral testing is considered an integral part of a complete examination. Limited examinations for reoccurring indications may be performed as noted.  Summary: No HITS at rest or during Valsalva. Negative transcranial Doppler Bubble study with no evidence of right to left intracardiac communication.  A vascular evaluation was performed. The left middle cerebral artery was studied. An IV was inserted into the patient's left Forearm. Verbal informed consent was obtained.     Preliminary     Vitals:   02/25/24 1040 02/25/24 1100 02/25/24 1200 02/25/24 1230  BP:   133/76 125/69  Pulse:      Resp:   20 20  Temp:  98.6 F (37 C)    TempSrc:  Axillary    SpO2: 98%     Weight:      Height:         PHYSICAL EXAM General:  Well-nourished, well-developed middle-aged African-American lady in no acute distress intubated, on sedation CV: Regular rate  and rhythm on monitor Respiratory:  Regular, unlabored respirations on vent     NEURO:  Patient remains intubated, on sedation.  On examination, eyes are open spontaneously and open halfway with stimulation.  Pupils 2 mm bilaterally.  Dysconjugate gaze noted-right eye midline, left eye deviated  left  Positive gag and cough reflex is present.  Follows simple commands on right. Mild withdrawal to noxious stimuli in RUE, LUE, and RLE; minimal withdraw in LLE. Overall same as yesterday.  Cranial Nerves:  II: PERRL. Visual fields full.  III, IV, VI: EOMI. Eyelids elevate symmetrically.  V: Sensation is intact to light touch and symmetrical to face.  VII: Face is symmetrical resting and smiling VIII: hearing intact to voice. IX, X: Palate elevates symmetrically. Phonation is normal.  KP:Dynloizm shrug 5/5. XII: tongue is midline without fasciculations. Motor: 5/5 strength to all muscle groups tested.  Tone: is normal and bulk is normal Sensation- Intact to light touch bilaterally. Extinction absent to light touch to DSS.   Coordination: FTN intact bilaterally, HKS: no ataxia in BLE.No drift.  Gait- deferred  Most Recent NIH  Dizziness Present: No (08/12 1200) Headache Present: No (08/12 1200) Interval: Shift assessment (08/12 0800) Level of Consciousness (1a.)   : Not alert, requires repeated stimulation to attend, or is obtunded and requires strong or painful stimulation to make movements (not stereotyped) (08/12 1200) LOC Questions (1b. )   : Answers neither question correctly (08/12 1200) LOC Commands (1c. )   : Performs both tasks correctly (08/12 1200) Best Gaze (2. )  : Partial gaze palsy (08/12 1200) Visual (3. )  : Complete hemianopia (08/12 1200) Facial Palsy (4. )    : Normal symmetrical movements (08/12 1200) Motor Arm, Left (5a. )   : No effort against gravity (08/12 1200) Motor Arm, Right (5b. ) : Some effort against gravity (08/12 1200) Motor Leg, Left (6a. )  : No effort  against gravity (08/12 1200) Motor Leg, Right (6b. ) : Some effort against gravity (08/12 1200) Limb Ataxia (7. ): Absent (08/12 1200) Sensory (8. )  : Mild-to-moderate sensory loss, patient feels pinprick is less sharp or is dull on the affected side, or there is a loss of superficial pain with pinprick, but patient is aware of being touched (08/12 1200) Best Language (9. )  : Mute, global aphasia (08/12 1200) Dysarthria (10. ): Intubated or other physical barrier (08/12 1200) Extinction/Inattention (11.)   : Visual/tactile/auditory/spatial/personal inattention (08/12 1200) Complete NIHSS TOTAL: 22 (08/12 1200)    ASSESSMENT/PLAN Ms. Edie Vallandingham is a 48 y.o. female with history of interstitial lung disease on CellCept, hypertension, hyperlipidemia, asthma admitted for headache, nausea vomiting and altered mental status. No TNK given due to [facility]. Sodium is continuing to downtrend with 200 mL free water . Continue current management and restart 3% once Na decreases below 153. Blood pressure adequately controlled.  TCD bubble study done at bedside negative for right-to-left shunt.  Patient underwent TEE today which noted a small channel at the IAS, per Dr. Holmes cannot entirely rule out a small PFO based on color Doppler and bubble study.  Will plan to extubate within the next few days and continue to wean off ventilatory support as per critical care team.    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 with  Right M  3 aneurysm rupture with vasospasm s/p craniotomy for hematoma and aneurysm clipping CT head right temporal large ICH with SDH, possible pseudo SAH, Duret hemorrhage with cerebral edema CT head and neck possible spot sign, right MCA occlusion   Status post hematoma evacuation CT head substantial evacuation of right temporal lobe hematoma, decreased right SDH, basilar cistern predominant SAH and decreased mass effect, residual leftward midline  shift 4 to 6  mm, stable volume new IVH without ventriculomegaly, right temporal lobe surgical vascular clips in place MRI brain Right temporal lobe intraparenchymal hemorrhage stable. 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  Nearly occluded right A1 branch.  Occluded proximal M1 branch with reconstitution of M2 branches in the sylvian fissure via collaterals 8/11 TCD Bubble Study: Normal 8/12 TEE: Small channel noted at the IAS. Cannot entirely rule out a small PFO based on color doppler and bubble study. Could consider a repeat study for further evaluation once extubated or different  2D Echo EF > 75% LDL 74 HgbA1c 5.3 UDS negative Heparin  subcu for VTE prophylaxis No antithrombotic prior to admission, now on No antithrombotic due to ICH Ongoing aggressive stroke risk factor management Therapy recommendations: Pending Disposition: Pending   Cerebral edema Brain herniation CT head 8/2 showed intracranial mass effect with widespread cerebral edema, right uncal herniation, trapped left lateral ventricle, effaced basilar cisterns, possible Duret hemorrhage in the right brainstem Status post hematoma evacuation with Dr. Lanis CT head 8/3 substantial evacuation of right temporal lobe hematoma, decreased right SDH, basilar cistern predominant SAH and decreased mass effect, residual leftward midline shift 4 to 6 mm, stable volume new IVH without ventriculomegaly, right temporal lobe surgical vascular clips in place CT 8/4 Similar parenchymal hemorrhage in the right temporal lobe with surrounding edema and local mass effect, resulting in approximately 5 mm leftward midline shift (previously 6 mm). CT 8/6 stable right  temporal lobe hemorrhage with expected evolution of surrounding vasogenic edema. Stable edematous changes and stable partial effacement of right lateral ventricle. No hydrocephalus.  CT 8/9 stable right temporal lobe hemorrhage with vasogenic edema and encephalomalacia.  Previously seen pneumocephalus has resolved.  Soft tissue swelling and fluid over the right frontotemporal craniotomy continues to improve. Status post 23.4% bolus 8/2 Now on 3% saline @ 75 -> 50-> off Na Q6 with goal 150-155 Na 137--141--140--149-155-159--165--161--160 - 163 -- 158 Continue free water  100->200 cc every 4 hours   Respiratory failure Intubated on sedation Vent management per CCM Still on propofol , off fentanyl  Tracheostomy planned for early this week per Dr. Shelah   ? 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, especially that pt is on cellcept with ILD Blood culture NGTD Tmax 101-100.5--100--100.7 Tachycardia improved Leukocytosis WBC 19.4--16.5--12.1 -- 10.8 -- 10.9 On Rocephin    Hypertension Mildly hypertensive, or Norvasc  10 and losartan  100 Off Cleviprex    On amlodipine  10, hydralazine  50 Q6h, losartan  100 daily, coreg  12.5 bid BP goal less than 160 Long-term BP goal normotensive   Hyperlipidemia Home meds: None LDL 74, goal < 70 Consider low dose statin at discharge   Dysphagia NPO S/p cortrak On tube feeding at 50cc Free water  100->200 cc every 4h   Other Stroke Risk Factors Obesity, Body mass index is 37.97 kg/m.    Other Active Problems Asthma Tachycardia on coreg , improved  Hospital day # 10  Alan Maiden, MD PGY-1    To contact Stroke Continuity provider, please refer to [email or web address]. After hours, contact General Neurology

## 2024-02-25 NOTE — CV Procedure (Signed)
    TRANSESOPHAGEAL ECHOCARDIOGRAM   NAME:  Katie Knight    MRN: [id number] DOB:  10-01-1975    ADMIT DATE: 02/15/2024  INDICATIONS: SIRS/Fever/Acute intraparenchymal hemorrhage s/p craniotomy and hematoma evacuation  PROCEDURE:   Informed consent was obtained prior to the procedure. The risks, benefits and alternatives for the procedure were discussed and the patient comprehended these risks.  Risks include, but are not limited to, cough, sore throat, vomiting, nausea, somnolence, esophageal and stomach trauma or perforation, bleeding, low blood pressure, aspiration, pneumonia, infection, trauma to the teeth and death.    Procedural time out performed. The oropharynx was anesthetized.  Anesthesia was administered by anesthesia team (see their records).  The transesophageal probe was inserted in the esophagus and stomach without difficulty and multiple views were obtained.   COMPLICATIONS:    There were no immediate complications.  KEY FINDINGS:  LVEF preserved. No valvular vegetation.  Small channel noted at the IAS. Cannot entirely rule out a small PFO based on color doppler and bubble study. Could consider a repeat study for further evaluation once extubated or different imaging modality based on clinical trajectory. Transcranial doppler is pending.  Full report to follow. Dr. Theodoro and Dr. Rosemarie updated of the findings as well as spouse.  Further management per primary team.   Madonna Large, DO, [facility]  A Division of [facility] [address], [place], [place] [address]  [place], [place] [address] Pager: [phone] Office: [phone] 11:09 AM

## 2024-02-25 NOTE — Procedures (Signed)
 Extubation Procedure Note  Patient Details:   Name: Katie Knight DOB: 08/16/1975 MRN: [id number]   Airway Documentation:    Vent end date: 02/25/24 Vent end time: 1545   Evaluation  O2 sats: transiently fell during during procedure Complications: No apparent complications Patient did tolerate procedure well. Bilateral Breath Sounds: Clear, Diminished   Yes  Patient extubated per MD order. Positive cuff leak. Vitals are stable on 4L [place]. Patient has good strong cough. RN at bedside.  Madai Nuccio H Author [place] 02/25/2024, 3:50 PM

## 2024-02-26 ENCOUNTER — Inpatient Hospital Stay (HOSPITAL_COMMUNITY)

## 2024-02-26 DIAGNOSIS — J9601 Acute respiratory failure with hypoxia: Secondary | ICD-10-CM | POA: Diagnosis not present

## 2024-02-26 DIAGNOSIS — I619 Nontraumatic intracerebral hemorrhage, unspecified: Secondary | ICD-10-CM | POA: Diagnosis not present

## 2024-02-26 DIAGNOSIS — I611 Nontraumatic intracerebral hemorrhage in hemisphere, cortical: Secondary | ICD-10-CM | POA: Diagnosis not present

## 2024-02-26 DIAGNOSIS — I6200 Nontraumatic subdural hemorrhage, unspecified: Secondary | ICD-10-CM | POA: Diagnosis not present

## 2024-02-26 DIAGNOSIS — R29722 NIHSS score 22: Secondary | ICD-10-CM | POA: Diagnosis not present

## 2024-02-26 DIAGNOSIS — I609 Nontraumatic subarachnoid hemorrhage, unspecified: Secondary | ICD-10-CM | POA: Diagnosis not present

## 2024-02-26 LAB — POCT I-STAT 7, (LYTES, BLD GAS, ICA,H+H)
Acid-Base Excess: 3 mmol/L — ABNORMAL HIGH (ref 0.0–2.0)
Acid-Base Excess: 3 mmol/L — ABNORMAL HIGH (ref 0.0–2.0)
Bicarbonate: 29.5 mmol/L — ABNORMAL HIGH (ref 20.0–28.0)
Bicarbonate: 28.5 mmol/L — ABNORMAL HIGH (ref 20.0–28.0)
Calcium, Ion: 1.28 mmol/L (ref 1.15–1.40)
Calcium, Ion: 1.27 mmol/L (ref 1.15–1.40)
HCT: 34 % — ABNORMAL LOW (ref 36.0–46.0)
HCT: 29 % — ABNORMAL LOW (ref 36.0–46.0)
Hemoglobin: 11.6 g/dL — ABNORMAL LOW (ref 12.0–15.0)
Hemoglobin: 9.9 g/dL — ABNORMAL LOW (ref 12.0–15.0)
O2 Saturation: 93 %
O2 Saturation: 99 %
Patient temperature: 99.6
Patient temperature: 99.6
Potassium: 4.2 mmol/L (ref 3.5–5.1)
Potassium: 4.3 mmol/L (ref 3.5–5.1)
Sodium: 160 mmol/L — ABNORMAL HIGH (ref 135–145)
Sodium: 160 mmol/L — ABNORMAL HIGH (ref 135–145)
TCO2: 31 mmol/L (ref 22–32)
TCO2: 30 mmol/L (ref 22–32)
pCO2 arterial: 57 mmHg — ABNORMAL HIGH (ref 32–48)
pCO2 arterial: 47.1 mmHg (ref 32–48)
pH, Arterial: 7.325 — ABNORMAL LOW (ref 7.35–7.45)
pH, Arterial: 7.392 (ref 7.35–7.45)
pO2, Arterial: 77 mmHg — ABNORMAL LOW (ref 83–108)
pO2, Arterial: 125 mmHg — ABNORMAL HIGH (ref 83–108)

## 2024-02-26 LAB — BODY FLUID CELL COUNT WITH DIFFERENTIAL
Eos, Fluid: 1 %
Lymphs, Fluid: 4 %
Monocyte-Macrophage-Serous Fluid: 5 % — ABNORMAL LOW (ref 50–90)
Neutrophil Count, Fluid: 90 % — ABNORMAL HIGH (ref 0–25)
Total Nucleated Cell Count, Fluid: 1070 uL — ABNORMAL HIGH (ref 0–1000)

## 2024-02-26 LAB — CBC
HCT: 27 % — ABNORMAL LOW (ref 36.0–46.0)
Hemoglobin: 7.2 g/dL — ABNORMAL LOW (ref 12.0–15.0)
MCH: 29.8 pg (ref 26.0–34.0)
MCHC: 26.7 g/dL — ABNORMAL LOW (ref 30.0–36.0)
MCV: 111.6 fL — ABNORMAL HIGH (ref 80.0–100.0)
Platelets: 310 K/uL (ref 150–400)
RBC: 2.42 MIL/uL — ABNORMAL LOW (ref 3.87–5.11)
RDW: 14 % (ref 11.5–15.5)
WBC: 10.2 K/uL (ref 4.0–10.5)
nRBC: 0 % (ref 0.0–0.2)

## 2024-02-26 LAB — COMPREHENSIVE METABOLIC PANEL WITH GFR
ALT: 104 U/L — ABNORMAL HIGH (ref 0–44)
AST: 54 U/L — ABNORMAL HIGH (ref 15–41)
Albumin: 2.5 g/dL — ABNORMAL LOW (ref 3.5–5.0)
Alkaline Phosphatase: 104 U/L (ref 38–126)
Anion gap: 8 (ref 5–15)
BUN: 42 mg/dL — ABNORMAL HIGH (ref 6–20)
CO2: 28 mmol/L (ref 22–32)
Calcium: 9.3 mg/dL (ref 8.9–10.3)
Chloride: 121 mmol/L — ABNORMAL HIGH (ref 98–111)
Creatinine, Ser: 1.14 mg/dL — ABNORMAL HIGH (ref 0.44–1.00)
GFR, Estimated: >60 mL/min (ref >60)
Glucose, Bld: 127 mg/dL — ABNORMAL HIGH (ref 70–99)
Potassium: 4.5 mmol/L (ref 3.5–5.1)
Sodium: 157 mmol/L — ABNORMAL HIGH (ref 135–145)
Total Bilirubin: 0.5 mg/dL (ref 0.0–1.2)
Total Protein: 7.7 g/dL (ref 6.5–8.1)

## 2024-02-26 LAB — GLUCOSE, CAPILLARY
Glucose-Capillary: 116 mg/dL — ABNORMAL HIGH (ref 70–99)
Glucose-Capillary: 122 mg/dL — ABNORMAL HIGH (ref 70–99)
Glucose-Capillary: 126 mg/dL — ABNORMAL HIGH (ref 70–99)
Glucose-Capillary: 133 mg/dL — ABNORMAL HIGH (ref 70–99)
Glucose-Capillary: 139 mg/dL — ABNORMAL HIGH (ref 70–99)
Glucose-Capillary: 156 mg/dL — ABNORMAL HIGH (ref 70–99)
Glucose-Capillary: 98 mg/dL (ref 70–99)
Glucose-Capillary: 99 mg/dL (ref 70–99)

## 2024-02-26 LAB — PHOSPHORUS: Phosphorus: 4.7 mg/dL — ABNORMAL HIGH (ref 2.5–4.6)

## 2024-02-26 LAB — MAGNESIUM: Magnesium: 2.8 mg/dL — ABNORMAL HIGH (ref 1.7–2.4)

## 2024-02-26 LAB — TRIGLYCERIDES: Triglycerides: 139 mg/dL (ref <150)

## 2024-02-26 MED ORDER — LIDOCAINE-EPINEPHRINE 1 %-1:100000 IJ SOLN
20.0000 mL | Freq: Once | INTRAMUSCULAR | Status: DC
  Filled 2024-02-26: qty 1

## 2024-02-26 MED ORDER — FENTANYL CITRATE PF 50 MCG/ML IJ SOSY: 50.0000 ug | PREFILLED_SYRINGE | INTRAMUSCULAR | Status: DC | PRN

## 2024-02-26 MED ORDER — MIDAZOLAM HCL 2 MG/2ML IJ SOLN
INTRAMUSCULAR | Status: AC
  Filled 2024-02-26: qty 2

## 2024-02-26 MED ORDER — ROCURONIUM BROMIDE 10 MG/ML (PF) SYRINGE
100.0000 mg | PREFILLED_SYRINGE | Freq: Once | INTRAVENOUS | Status: AC
  Administered 2024-02-26 (×2): 100 mg via INTRAVENOUS
  Filled 2024-02-26: qty 10

## 2024-02-26 MED ORDER — MIDAZOLAM HCL 2 MG/2ML IJ SOLN
8.0000 mg | Freq: Once | INTRAMUSCULAR | Status: AC
  Administered 2024-02-26 (×2): 4 mg via INTRAVENOUS
  Filled 2024-02-26: qty 8

## 2024-02-26 MED ORDER — FENTANYL CITRATE PF 50 MCG/ML IJ SOSY
PREFILLED_SYRINGE | INTRAMUSCULAR | Status: AC
  Filled 2024-02-26: qty 2

## 2024-02-26 MED ORDER — MIDAZOLAM HCL 2 MG/2ML IJ SOLN
2.0000 mg | Freq: Once | INTRAMUSCULAR | Status: AC
  Administered 2024-02-26 (×2): 2 mg via INTRAVENOUS

## 2024-02-26 MED ORDER — ROCURONIUM BROMIDE 10 MG/ML (PF) SYRINGE
100.0000 mg | PREFILLED_SYRINGE | Freq: Once | INTRAVENOUS | Status: AC
  Administered 2024-02-26 (×2): 100 mg via INTRAVENOUS

## 2024-02-26 MED ORDER — ETOMIDATE 2 MG/ML IV SOLN
20.0000 mg | Freq: Once | INTRAVENOUS | Status: AC
  Administered 2024-02-26 (×2): 20 mg via INTRAVENOUS
  Filled 2024-02-26: qty 10

## 2024-02-26 MED ORDER — ETOMIDATE 2 MG/ML IV SOLN
INTRAVENOUS | Status: AC
  Filled 2024-02-26: qty 20

## 2024-02-26 MED ORDER — ALBUTEROL SULFATE (2.5 MG/3ML) 0.083% IN NEBU
2.5000 mg | INHALATION_SOLUTION | Freq: Four times a day (QID) | RESPIRATORY_TRACT | Status: DC
  Administered 2024-02-26 – 2024-03-03 (×28): 2.5 mg via RESPIRATORY_TRACT
  Filled 2024-02-26 (×25): qty 3

## 2024-02-26 MED ORDER — ETOMIDATE 2 MG/ML IV SOLN
20.0000 mg | Freq: Once | INTRAVENOUS | Status: AC
  Administered 2024-02-26 (×2): 20 mg via INTRAVENOUS

## 2024-02-26 MED ORDER — PHENYLEPHRINE 80 MCG/ML (10ML) SYRINGE FOR IV PUSH (FOR BLOOD PRESSURE SUPPORT)
PREFILLED_SYRINGE | INTRAVENOUS | Status: AC
  Administered 2024-02-26 (×2): 7 ug
  Filled 2024-02-26: qty 10

## 2024-02-26 MED ORDER — FENTANYL CITRATE PF 50 MCG/ML IJ SOSY
200.0000 ug | PREFILLED_SYRINGE | Freq: Once | INTRAMUSCULAR | Status: AC
  Administered 2024-02-26 (×2): 100 ug via INTRAVENOUS
  Filled 2024-02-26: qty 4

## 2024-02-26 MED ORDER — ROCURONIUM BROMIDE 10 MG/ML (PF) SYRINGE
PREFILLED_SYRINGE | INTRAVENOUS | Status: AC
  Filled 2024-02-26: qty 10

## 2024-02-26 MED ORDER — SODIUM CHLORIDE 3 % IN NEBU
4.0000 mL | INHALATION_SOLUTION | Freq: Four times a day (QID) | RESPIRATORY_TRACT | Status: AC
  Administered 2024-02-26 – 2024-02-29 (×15): 4 mL via RESPIRATORY_TRACT
  Filled 2024-02-26 (×10): qty 4

## 2024-02-26 MED ORDER — HEPARIN SODIUM (PORCINE) 5000 UNIT/ML IJ SOLN
5000.0000 [IU] | Freq: Three times a day (TID) | INTRAMUSCULAR | Status: DC
  Administered 2024-02-27 – 2024-03-04 (×20): 5000 [IU] via SUBCUTANEOUS
  Filled 2024-02-26 (×19): qty 1

## 2024-02-26 MED ORDER — FENTANYL CITRATE PF 50 MCG/ML IJ SOSY
50.0000 ug | PREFILLED_SYRINGE | Freq: Once | INTRAMUSCULAR | Status: AC
  Administered 2024-02-26 (×2): 50 ug via INTRAVENOUS

## 2024-02-26 MED ORDER — ACETYLCYSTEINE 20 % IN SOLN
2.0000 mL | Freq: Four times a day (QID) | RESPIRATORY_TRACT | Status: DC
  Filled 2024-02-26 (×2): qty 4

## 2024-02-26 MED ORDER — FENTANYL CITRATE PF 50 MCG/ML IJ SOSY
50.0000 ug | PREFILLED_SYRINGE | INTRAMUSCULAR | Status: DC | PRN
  Administered 2024-02-26: 50 ug via INTRAVENOUS
  Administered 2024-02-26: 100 ug via INTRAVENOUS
  Administered 2024-02-26 (×2): 50 ug via INTRAVENOUS
  Administered 2024-02-26: 100 ug via INTRAVENOUS
  Administered 2024-02-26 – 2024-02-27 (×4): 50 ug via INTRAVENOUS
  Filled 2024-02-26: qty 2
  Filled 2024-02-26 (×5): qty 1

## 2024-02-26 MED ORDER — SUCCINYLCHOLINE CHLORIDE 200 MG/10ML IV SOSY
PREFILLED_SYRINGE | INTRAVENOUS | Status: AC
  Filled 2024-02-26: qty 10

## 2024-02-26 MED ORDER — LACTATED RINGERS IV BOLUS: 500.0000 mL | Freq: Once | INTRAVENOUS | Status: DC

## 2024-02-26 NOTE — Progress Notes (Addendum)
 eLink Physician-Brief Progress Note Patient Name: Katie Knight DOB: 05-30-1976 MRN: [id number]   Date of Service  02/26/2024  HPI/Events of Note  Patient had been tachycardic earlier with normal BP. At change of shift, E link was asked to follow an ABG and a CXR which were ok. Her resp was ok then too. Due to tachycardia, we tried a small fluid bolus but now patient is very lethargic and has dropped her O2 sat to 70s. Bedside had placed her on Bipap and we were called. However she is too drowsy and I feel she cannot protect her airway. Ground team notified and are coming in to assess her. Fluids were not even started yet.   eICU Interventions  As above      Intervention Category Major Interventions: Arrhythmia - evaluation and management  [name] [name] 02/26/2024, 12:37 AM  2:50 am - Noted results of head CT, no worsening seen there. ABG noted from before but CCM also did a bronch. That should have also helped with hypercapnia. Will; repeat ABG in 1 hour. Also asked RN to send off all AM labs that are ordered.

## 2024-02-26 NOTE — TOC Progression Note (Signed)
 Transition of Care [facility]) - Progression Note    Patient Details  Name: Katie Knight MRN: [id number] Date of Birth: Aug 30, 1975  Transition of [facility]) CM/SW Contact  Inocente GORMAN Kindle, LCSW Phone Number: 02/26/2024, 9:32 AM  Clinical Narrative:    ICM continuing to follow. Patient was reintubated.      Barriers to Discharge: Continued Medical Work up               Expected Discharge Plan and Services                                               Social Drivers of Health (SDOH) Interventions SDOH Screenings   Food Insecurity: No Food Insecurity (02/21/2024)  Housing: Unknown (02/21/2024)  Transportation Needs: No Transportation Needs (02/21/2024)  Utilities: Not At Risk (02/21/2024)  Tobacco Use: Low Risk  (02/15/2024)    Readmission Risk Interventions     No data to display

## 2024-02-26 NOTE — Evaluation (Signed)
 Physical Therapy Evaluation Patient Details Name: Katie Knight MRN: [id number] DOB: 1975-08-05 Today's Date: 02/26/2024  History of Present Illness  Katie Knight is a 48 year old female who presented to the ED with complaints of headache with associated nausea and vomiting. CTA showed large R IPH with some subdural subarachnoid hemorrhage as well. Pt reintubated on 8/11, scheduled for track on 8/13.  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 PTA pt was indep without AD, lived with wife and 3 kids, drove but didn't work. Pt now intubated with noted L hemiplegia. Pt did follow commands majority of time with R UE and LE however with no active movement of L UE and LE. Unsure of patient medical prognosis. Acute PT to continue to follow to assess mobility and d/c recommendations.        If plan is discharge home, recommend the following: Two people to help with bathing/dressing/bathroom;Two people to help with walking and/or transfers;Help with stairs or ramp for entrance;Assist for transportation   Can travel by private vehicle        Equipment Recommendations  (TBD)  Recommendations for Other Services       Functional Status Assessment Patient has had a recent decline in their functional status and/or demonstrates limited ability to make significant improvements in function in a reasonable and predictable amount of time     Precautions / Restrictions Precautions Precautions: Fall Precaution/Restrictions Comments: intubated/vent Restrictions Weight Bearing Restrictions Per Provider Order: No      Mobility  Bed Mobility               General bed mobility comments: pt requiring totalA for bed mobility. Pt placed in chair position in bed.    Transfers                   General transfer comment: unsafe at this time     Ambulation/Gait               General Gait Details: unable this date  Stairs            Wheelchair Mobility     Tilt Bed    Modified Rankin (Stroke Patients Only)       Balance Overall balance assessment: Needs assistance (pt dependent on bed to maintain upright posture via chair position of bed)                                           Pertinent Vitals/Pain Pain Assessment Pain Assessment: No/denies pain (unable to vocalize)    Home Living Family/patient expects to be discharged to:: Private residence Living Arrangements: Spouse/significant other;Children (wife and  3 kids, 22yo, 13yo, and 4yo) Available Help at Discharge: Family;Available 24 hours/day Type of Home: House Home Access: Stairs to enter   Entrance Stairs-Number of Steps: 1 Alternate Level Stairs-Number of Steps: 14 Home Layout: Two level Home Equipment: None      Prior Function Prior Level of Function : Independent/Modified Independent;Driving             Mobility Comments: no AD ADLs Comments: indep     Extremity/Trunk Assessment   Upper Extremity Assessment Upper Extremity Assessment: LUE deficits/detail;RUE deficits/detail RUE Deficits / Details: grossly 3-/5, unable to raise arm > 1/2 the range LUE  Deficits / Details: no active movement, full PROM LUE Sensation: decreased light touch    Lower Extremity Assessment Lower Extremity Assessment: RLE deficits/detail;LLE deficits/detail RLE Deficits / Details: pt able to initiate movement and wiggle toes but did not attempt LAQ in chair position in bed RLE Sensation:  (blinked to noxious stimuli) LLE Deficits / Details: no active movement, full PROM, no withdraw to noxious stimuli    Cervical / Trunk Assessment Cervical / Trunk Assessment: Normal;Other exceptions Cervical / Trunk Exceptions: morbid obesity  Communication   Communication Communication: Impaired Factors Affecting Communication:  Trach/intubated    Cognition Arousal: Lethargic (opened eyes to name but couldn't maintain) Behavior During Therapy: Flat affect   PT - Cognitive impairments: Difficult to assess Difficult to assess due to: Intubated                     PT - Cognition Comments: pt lethargic, did open eyes to name, followed commands majority of time with R UE and LE, shook head yes when asked if she was ready to lay down Following commands: Impaired Following commands impaired: Follows one step commands with increased time (can follow with R UE and LE)     Cueing Cueing Techniques: Verbal cues, Tactile cues     General Comments General comments (skin integrity, edema, etc.): pt incontinent of loose stool, VSS    Exercises General Exercises - Lower Extremity Ankle Circles/Pumps: AAROM, Right, 10 reps (chair position, PROM to L LE) Long Arc Quad: PROM, Both, 10 reps, Seated (chair position in bed)   Assessment/Plan    PT Assessment Patient needs continued PT services  PT Problem List Decreased strength;Decreased activity tolerance;Decreased balance;Decreased mobility;Decreased cognition;Obesity       PT Treatment Interventions DME instruction;Gait training;Stair training;Functional mobility training;Therapeutic activities;Therapeutic exercise;Balance training;Neuromuscular re-education    PT Goals (Current goals can be found in the Care Plan section)  Acute Rehab PT Goals Patient Stated Goal: didn't state PT Goal Formulation: With patient/family Time For Goal Achievement: 03/11/24 Potential to Achieve Goals: Fair    Frequency Min 2X/week     Co-evaluation               AM-PAC PT 6 Clicks Mobility  Outcome Measure Help needed turning from your back to your side while in a flat bed without using bedrails?: Total Help needed moving from lying on your back to sitting on the side of a flat bed without using bedrails?: Total Help needed moving to and from a bed to a chair  (including a wheelchair)?: Total Help needed standing up from a chair using your arms (e.g., wheelchair or bedside chair)?: Total Help needed to walk in hospital room?: Total Help needed climbing 3-5 steps with a railing? : Total 6 Click Score: 6    End of Session Equipment Utilized During Treatment: Oxygen  (trach) Activity Tolerance: Patient limited by fatigue Patient left: in bed;with call bell/phone within reach;with family/visitor present Nurse Communication: Mobility status (pt incontinent of stool) PT Visit Diagnosis: Unsteadiness on feet (R26.81);Muscle weakness (generalized) (M62.81);Difficulty in walking, not elsewhere classified (R26.2)    Time: 8859-8841 PT Time Calculation (min) (ACUTE ONLY): 18 min   Charges:   PT Evaluation $PT Eval Moderate Complexity: 1 Mod   PT General Charges $$ ACUTE PT VISIT: 1 Visit         Norene Ames, PT, DPT Acute Rehabilitation Services Secure chat preferred Office #: [phone]   Norene CHRISTELLA Ames 02/26/2024, 1:45 PM

## 2024-02-26 NOTE — Procedures (Signed)
 Intubation Procedure Note  Llewellyn Schoenberger  [id number]  Feb 22, 1976  Date:02/26/24  Time:1:01 AM   Provider Performing:Destaney Sarkis W Rosan    Procedure: Intubation (31500)  Indication(s) Respiratory Failure  Consent Unable to obtain consent due to emergent nature of procedure.   Anesthesia Etomidate , Versed , Fentanyl , and Rocuronium    Time Out Verified patient identification, verified procedure, site/side was marked, verified correct patient position, special equipment/implants available, medications/allergies/relevant history reviewed, required imaging and test results available.   Sterile Technique Usual hand hygeine, masks, and gloves were used   Procedure Description Patient positioned in bed supine.  Sedation given as noted above.  Patient was intubated with endotracheal tube using Glidescope.  View was Grade 2 only posterior commissure .  Number of attempts was 1.  Colorimetric CO2 detector was consistent with tracheal placement.   Complications/Tolerance None; patient tolerated the procedure well. Chest X-ray is ordered to verify placement.   EBL Minimal   Specimen(s) None    Deward Rosan, AGACNP-BC [facility]  See Amion for personal pager PCCM on call pager [phone] until 7pm. Please call Elink 7p-7a. [phone]  02/26/2024 1:01 AM

## 2024-02-26 NOTE — Progress Notes (Signed)
 Contacted E-link earlier in the shift due to increasing HR in the 130's and respiratory rate in the 30's. Ordered fluid bolus. Patient on 6L [place]. Patient then began to desat to the 80's, and HR in the 140's. Called RT, and pt was put on bipap. Patient dropped to the 70's. Notified E-link. Ground team at bedside. Patient intubated.

## 2024-02-26 NOTE — Progress Notes (Signed)
 02/26/2024 Met with family at bedside, examined patient, explained risks and benefits of percutaneous tracheostomy.  They are agreeable to proceed.  Tentative for 3pm today.  Rolan Sharps MD PCCM

## 2024-02-26 NOTE — Procedures (Signed)
 Bronchoscopy Procedure Note  Dorrie Cocuzza  [id number]  [birth year]  Date:02/26/24  Time:1:36 AM   Provider Performing:Shima Compere Tobie   Procedure(s):  Flexible bronchoscopy with bronchial alveolar lavage [phone])  Indication(s) Right lobar collapse  Consent Risks of the procedure as well as the alternatives and risks of each were explained to the patient and/or caregiver.  Consent for the procedure was obtained and is signed in the bedside chart  Anesthesia On vent   Time Out Verified patient identification, verified procedure, site/side was marked, verified correct patient position, special equipment/implants available, medications/allergies/relevant history reviewed, required imaging and test results available.   Sterile Technique Usual hand hygiene, masks, gowns, and gloves were used   Procedure Description Bronchoscope advanced through endotracheal tube and into airway.  Airways were examined down to subsegmental level with findings noted below.   Following diagnostic evaluation, BAL(s) performed in RLL with normal saline and return of 20cc fluid  Findings: right mainstem mucous plug RLL mucous plug Scattered secretions on right side Left side normal   Complications/Tolerance None; patient tolerated the procedure well. Chest X-ray is not needed post procedure.   EBL Minimal   Specimen(s) Sent for culture

## 2024-02-26 NOTE — Progress Notes (Addendum)
 NAME:  Katie Knight, MRN:  [id number], DOB:  [birth year], LOS: 11 ADMISSION DATE:  02/15/2024, CONSULTATION DATE:  02/15/2024 REFERRING MD:  Dr. 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  Stroke code stroke CTA showed large intraparenchymal hemorrhage on the right side with some subdural subarachnoid hemorrhage as well.  Neurosurgery and neurology consulted for further management.  PCCM consulted for admission.  On ED arrival patient was seen mildly hypothermic, bradycardic, and hypertensive.  Lab work significant for potassium 3.3, glucose 216, hemoglobin 11 point.   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 Taken for crani and evacuation. Clipping of distal RMCA aneurysm  8/3 - Agitated overnight. BP remains at upper limit of goal.2L positive.Tmax 100.1. Tolerating SBT 8/6 repeat head CT with stable right temporal lobe hemorrhage with expected evolution of surrounding vasogenic edema and stable edematous changes and probable infarct in the posterior limb of the internal capsule 8/7 no acute issues overnight 8/9 head CT >> right temporal parenchymal  hemorrhage 4.1 cm stable in size.  No significant change vasogenic edema, encephalomalacia of the right temporal lobe.  Previously seen pneumocephalus has resolved 8/11: improved mentation follwing commands but still drowsy. Overnight fevers.  8/12: TEE no vegetation. Extubated. 8/13: reintubated. Bronch w Right sided mucus plugging.   Interim History / Subjective:  Re intubated overnight.  Was trialed on BiPAP.  Repeat CT head with improving edema.  Bronc showing neutrophil predominant WBC.  Objective    Blood pressure 117/88, pulse (!) 117, temperature 98.6 F (37 C), temperature source Axillary, resp. rate 20, height 5' 7.01 (1.702 m), weight 121.9 kg, SpO2 100%.    Vent Mode: PRVC FiO2 (%):  [40 %-100 %] 80 % Set Rate:  [20 bmp] 20 bmp Vt Set:  [500 mL] 500 mL PEEP:  [5 cmH20-8 cmH20] 8 cmH20 Pressure Support:  [5 cmH20] 5 cmH20 Plateau Pressure:  [18 cmH20-25 cmH20] 20 cmH20   Intake/Output Summary (Last 24 hours) at 02/26/2024 0743 Last data filed at 02/26/2024 0700 Gross per 24 hour  Intake 2056.21 ml  Output 2450 ml  Net -393.79 ml   Filed Weights   02/18/24 0723 02/22/24 0500 02/23/24 0400  Weight: 110 kg 123 kg 121.9 kg    Examination:  General: Obese young woman, no distress off sedation HEENT: ET tube in good position, oropharynx moist Neuro: IRUL strength 4/5. RLE 3/5. No movement on on the left.  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  Reintubated overnight.  Bronc post reintubation showed right sided mucous plugging.  With underlying neurological condition as above and poor secretion clearance seems like heading towards trach.  Will put this on the radar off team doing tracheostomy. - trial at [facility] for nerve stimulation to help clear secretions.  - alb + 3% saline nebs for secretions.  - Currently off sedation.  RASS goal 0 -  -1. -Pulmonary hygiene -VAP prevention orders - CellCept is on hold in the setting of critical illness.  With bronch showing neutrophils will continue to hold CellCept.  Will resume once confirmed that she does not have infection. - 7-day course empiric ceftriaxone  completed .  Cultures negative.  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 - TEE negative for vegetation.  Therapeutic Hypernatermia Goal Na per neuro 150-155.  Off 3% saline. -FWF 200 q4h.   Anemia of critical illness -Following CBC - Slowly decreasing trend of hemoglobin.  Monitor.  Transfuse to keep greater than 7.  Hyperglycemia - Unknown history of diabetes with last hemoglobin A1c October 2024 was 5.7 -Moderate sliding scale insulin  protocol  HTN: - Continue amlodipine , Coreg , losartan .  Hypertension Hyperlipidemia -Appears patient is on no medications at baseline for hypertension P:  - Continue carvedilol , hydralazine , losartan  - SBP goal <160  Constipation P: - Continue senna, Colace, MiraLAX , Dulcolax suppository - Enema if needed   Best Practice (right click and Reselect all SmartList Selections daily)    Diet/type: tubefeeds.  DVT prophylaxis.  Heparin  and SCD. Pressure ulcer(s): N/A GI prophylaxis: H2B Lines: N/A Foley:  removed.  Code Status:  full code  Last date of multidisciplinary goals of care discussion: Discussed with partner and mother about possible trial at extubation today after the TEE.  CRITICAL CARE  Total critical care time: 40 minutes  Critical care time was exclusive of separately billable procedures and treating other patients.  Critical care was necessary to treat or prevent imminent or life-threatening deterioration.  Critical care was time spent personally by me on the following activities: development of treatment plan with patient and/or surrogate as well as nursing, discussions with consultants, evaluation of patient's response to treatment, examination of patient, obtaining history from patient or surrogate, ordering and performing treatments and interventions, ordering and review of laboratory studies, ordering and review of radiographic studies, pulse oximetry and re-evaluation of patient's condition.   Sammi Fredericks, MD.  02/26/2024, 7:43 AM

## 2024-02-26 NOTE — Progress Notes (Signed)
 Diagnostic Bronchoscopy  Genene Kilman  [id number]  [birth year]  Date:02/26/24  Time:3:16 PM   Provider Performing:Andreas Sobolewski JAYSON Sharps   Procedure: Diagnostic Bronchoscopy (68377)  Indication(s) Assist with direct visualization of tracheostomy placement  Consent Risks of the procedure as well as the alternatives and risks of each were explained to the patient and/or caregiver.  Consent for the procedure was obtained.   Anesthesia See separate tracheostomy note   Time Out Verified patient identification, verified procedure, site/side was marked, verified correct patient position, special equipment/implants available, medications/allergies/relevant history reviewed, required imaging and test results available.   Sterile Technique Usual hand hygiene, masks, gowns, and gloves were used   Procedure Description Bronchoscope advanced through endotracheal tube and into airway.  After suctioning out tracheal secretions, bronchoscope used to provide direct visualization of tracheostomy placement.   Complications/Tolerance None; patient tolerated the procedure well.   EBL None  Specimen(s) None   Sherlean Sharps AGACNP-BC   [facility] 02/26/2024, 3:16 PM  Please see [email or web address] for pager details.  From 7A-7P if no response, please call [phone]. After hours, please call ELink [phone].

## 2024-02-26 NOTE — Progress Notes (Signed)
 Pt. Was placed on bipap due to decreased sats and distress.

## 2024-02-26 NOTE — Progress Notes (Addendum)
 STROKE TEAM PROGRESS NOTE   SIGNIFICANT HOSPITAL EVENTS: 8/2 presented with complaints of headache, nausea, vomiting, and altered mental status of large intraparenchymal hemorrhage. Taken for crani and evacuation. Clipping of distal RMCA aneurysm  8/3 Agitated overnight. BP remains at upper limit of goal.2L positive.Tmax 100.1. Tolerating SBT 8/6 repeat head CT with stable right temporal lobe hemorrhage with expected evolution of surrounding vasogenic edema and stable edematous changes and probable infarct in the posterior limb of the internal capsule 8/9 Head CT: R temporal parenchymal hemorrhage 4.1 cm stable in size.  No significant change vasogenic edema, encephalomalacia of the right temporal lobe.  Previously seen pneumocephalus has resolved 8/11 Improved mentation follwing commands but still drowsy. Overnight fevers.  8/12: TEE no vegetation. Extubated. 8/13: reintubated. Bronch w/ Right sided mucus plugging.   SUBJECTIVE (INTERIM HISTORY)  Wife and mother are  at bedside.  Patient was extubated at the end of day and was initially doing well, but overnight became progressively lethargic, tachycardic, and hypoxic resulting in reintubation due to respiratory failure.  Today, patient remains sedated and intubated but is arousable and follows simple commands.  Neurologically, patient has no significant changes. Family was consulted about patient's issue controlling oral secretions and how that may have contributed to failed extubation.   Speech therapy will follow up about  possible phagenyx/neurostimulation to help with swallowing.  Pulmonary team is planning on tracheostomy.   OBJECTIVE  CBC    Component Value Date/Time   WBC 10.2 02/26/2024 0116   RBC 2.42 (L) 02/26/2024 0116   HGB 9.9 (L) 02/26/2024 0248   HCT 29.0 (L) 02/26/2024 0248   PLT 310 02/26/2024 0116   MCV 111.6 (H) 02/26/2024 0116   MCH 29.8 02/26/2024 0116   MCHC 26.7 (L) 02/26/2024 0116   RDW 14.0 02/26/2024 0116    LYMPHSABS 1.0 02/15/2024 0659   MONOABS 0.6 02/15/2024 0659   EOSABS 0.0 02/15/2024 0659   BASOSABS 0.0 02/15/2024 0659    BMET    Component Value Date/Time   NA 157 (H) 02/26/2024 0454   NA 137 05/07/2023 0845   K 4.5 02/26/2024 0454   CL 121 (H) 02/26/2024 0454   CO2 28 02/26/2024 0454   GLUCOSE 127 (H) 02/26/2024 0454   BUN 42 (H) 02/26/2024 0454   BUN 15 05/07/2023 0845   CREATININE 1.14 (H) 02/26/2024 0454   CREATININE 0.74 08/19/2023 1015   CALCIUM 9.3 02/26/2024 0454   EGFR 102 08/19/2023 1015   EGFR 91 05/07/2023 0845   GFRNONAA >60 02/26/2024 0454    IMAGING past 24 hours CT HEAD WO CONTRAST ( ) Result Date: 02/26/2024 CLINICAL DATA:  Initial evaluation for acute mental status change. EXAM: CT HEAD WITHOUT CONTRAST TECHNIQUE: Contiguous axial images were obtained from the base of the skull through the vertex without intravenous contrast. RADIATION DOSE REDUCTION: This exam was performed according to the departmental dose-optimization program which includes automated exposure control, adjustment of the mA and/or kV according to patient size and/or use of iterative reconstruction technique. COMPARISON:  Prior CT from 02/22/2024 FINDINGS: Brain: Postoperative changes from prior right-sided craniotomy. Previously seen pneumocephalus has resolved. Trace extra-axial hemorrhage versus dural thickening subjacent to the craniotomy bone flap. Surgical clips in the region of the right MCA bifurcation. Intraparenchymal hemorrhage involving the right temporal lobe is relatively stable in size and morphology measuring up to approximately 4.5 cm with decreased density as compared to previous exam. Surrounding vasogenic edema is slightly more well-defined and decreased from prior. No new intracranial hemorrhage. No  other acute large vessel territory infarct. No mass lesion or midline shift. No hydrocephalus. Vascular: No abnormal hyperdense vessel. Skull: Post craniotomy changes on the right.  Skin staples remain in place. Sinuses/Orbits: Globes orbital soft tissues within normal limits. Sphenoid sinuses and visualized left maxillary sinus are largely opacified. No significant mastoid effusion. Other: None. IMPRESSION: 1. Evolving intraparenchymal hemorrhage involving the right temporal lobe, similar in size but with decreased density as compared to prior. Surrounding vasogenic edema has slightly improved. 2. No other new acute intracranial abnormality. 3. Moderate sphenoid and left maxillary sinus disease. Electronically Signed   By: Morene Hoard M.D.   On: 02/26/2024 02:40   DG CHEST PORT 1 VIEW Result Date: 02/26/2024 CLINICAL DATA:  Status post intubation EXAM: PORTABLE CHEST 1 VIEW COMPARISON:  Film from the previous day. FINDINGS: Endotracheal tube is seen approximately 4 cm above the carina. Feeding catheter extends into the stomach. Cardiac shadow is within normal limits. Left lung is clear. Complete opacification of the right hemithorax is noted new from the previous film likely related to mucous plugging. No bony abnormality is noted. IMPRESSION: Complete opacification of the right hemithorax likely related to mucous plugging. Endotracheal tube and feeding catheter in satisfactory position. Electronically Signed   By: Oneil Devonshire M.D.   On: 02/26/2024 01:32   DG Chest Port 1 View Result Date: 02/25/2024 CLINICAL DATA:  Acute respiratory failure EXAM: PORTABLE CHEST 1 VIEW COMPARISON:  02/18/2024 FINDINGS: Cardiac shadow is prominent but stable. Feeding catheter is noted extending into the stomach to the right of the midline consistent with the known heterotaxy. Free air seen. Elevation the right hemidiaphragm is noted. Bibasilar atelectasis is seen. IMPRESSION: Bibasilar atelectasis. Feeding catheter within the stomach. Electronically Signed   By: Oneil Devonshire M.D.   On: 02/25/2024 19:19    Vitals:   02/26/24 1000 02/26/24 1100 02/26/24 1116 02/26/24 1200  BP: 97/65 97/63   97/62  Pulse: (!) 122 (!) 115  (!) 119  Resp: (!) 21 20 (!) 23 20  Temp:      TempSrc:      SpO2: 100% 100%  100%  Weight:      Height:        PHYSICAL EXAM General:  Well-nourished, well-developed middle-aged African-American lady in no acute distress intubated CV: Regular rate and rhythm on monitor Respiratory:  Regular, unlabored respirations on vent     NEURO:  Patient remains intubated.  On examination, eyes are open spontaneously and open halfway with stimulation.  Pupils 2 mm bilaterally.  Dysconjugate gaze noted-right eye midline, left eye deviated left  Positive gag and cough reflex is present.  Mild withdrawal to noxious stimuli in RUE, LUE, and RLE; minimal withdraw in LLE. Overall more alert compared to yesterday.   Most Recent NIH  Dizziness Present: No (08/13 1200) Headache Present: No (08/13 1200) Interval: Shift assessment (08/13 0800) Level of Consciousness (1a.)   : Not alert, but arousable by minor stimulation to obey, answer, or respond (08/13 1200) LOC Questions (1b. )   : Answers neither question correctly (08/13 1200) LOC Commands (1c. )   : Performs neither task correctly (08/13 1200) Best Gaze (2. )  : Forced deviation (08/13 1200) Visual (3. )  : Complete hemianopia (08/13 1200) Facial Palsy (4. )    : Normal symmetrical movements (08/13 1200) Motor Arm, Left (5a. )   : No effort against gravity (08/13 1200) Motor Arm, Right (5b. ) : Some effort against gravity (08/13 1200) Motor  Leg, Left (6a. )  : No effort against gravity (08/13 1200) Motor Leg, Right (6b. ) : Some effort against gravity (08/13 1200) Limb Ataxia (7. ): Absent (08/13 1200) Sensory (8. )  : Normal, no sensory loss (08/13 1200) Best Language (9. )  : Severe aphasia (08/13 1200) Dysarthria (10. ): Intubated or other physical barrier (08/13 1200) Extinction/Inattention (11.)   : Visual/tactile/auditory/spatial/personal inattention (08/13 1200) Complete NIHSS TOTAL: 22 (08/13  1200)   ASSESSMENT/PLAN Ms. Javayah Magaw is a 48 y.o. female with history of interstitial lung disease on CellCept, hypertension, hyperlipidemia, asthma admitted for headache, nausea vomiting and altered mental status. No TNK given due to [facility]. WBC continues to improve on Rocephin . Continues to have overnight fevers. Sodium is continuing to downtrend with 200 mL free water . Continue current management and restart 3% once Na decreases below 153. We will plan to trial extubate tomorrow with TEE planned this week per Dr. Shelah.   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  Nearly occluded right A1 branch.  Occluded proximal M1 branch with reconstitution of M2 branches in the sylvian fissure via collaterals 2D Echo EF > 75% LDL 74 HgbA1c 5.3 UDS negative Heparin  subcu for VTE prophylaxis No antithrombotic prior to admission, now on No antithrombotic due to ICH Ongoing aggressive stroke risk factor  management Therapy recommendations: Pending Disposition: Pending   Cerebral edema Brain herniation CT head 8/2 showed intracranial mass effect with widespread cerebral edema, right uncal herniation, trapped left lateral ventricle, effaced basilar cisterns, possible Duret hemorrhage in the right brainstem Status post hematoma evacuation with Dr. Lanis CT head 8/3 substantial evacuation of right temporal lobe hematoma, decreased right SDH, basilar cistern predominant SAH and decreased mass effect, residual leftward midline shift 4 to 6 mm, stable volume new IVH without ventriculomegaly, right temporal lobe surgical vascular clips in place CT 8/4 Similar parenchymal hemorrhage in the right temporal lobe with surrounding edema and local mass effect, resulting in approximately 5 mm leftward midline shift (previously 6 mm). CT 8/6 stable right temporal lobe hemorrhage with expected evolution of surrounding vasogenic edema. Stable edematous changes and stable partial effacement of right lateral ventricle. No hydrocephalus.  CT 8/9 stable right temporal lobe hemorrhage with vasogenic edema and encephalomalacia.  Previously seen pneumocephalus has resolved.  Soft tissue swelling and fluid over the right frontotemporal craniotomy continues to improve. Status post 23.4% bolus 8/2 Now on 3% saline @ 75 -> 50-> off Na Q6 with goal 150-155 Na 137--141--140--149-155-159--165--161--160 - 163 -- 158 -- 157 Continue free water  100->200 cc every 4 hours   Respiratory  failure Intubated on sedation Vent management per CCM Still on propofol , off fentanyl  8/12: Extubated 8/13: Respiratory failure overnight, reintubated. Bronch w/ Right sided mucus plugging.   ? 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, especially that pt is on cellcept  with ILD Blood culture NGTD Tmax 101-100.5--100--100.7 Tachycardia improved Leukocytosis WBC 19.4--16.5--12.1 -- 10.8 -- 10.9 On Rocephin    Hypertension Mildly hypertensive, or Norvasc  10 and losartan  100 Off Cleviprex    On amlodipine  10, hydralazine  50 Q6h, losartan  100 daily, coreg  12.5 bid BP goal less than 160 Long-term BP goal normotensive   Hyperlipidemia Home meds: None LDL 74, goal < 70 Consider low dose statin at discharge   Dysphagia NPO S/p cortrak On tube feeding at 50cc Free water  100->200 cc every 4h   Other Stroke Risk Factors Obesity, Body mass index is 37.97 kg/m.    Other Active Problems Asthma Tachycardia on coreg , improved  Hospital day # 11  Alan Maiden, MD PGY-1  I have personally obtained history,examined this patient, reviewed notes, independently viewed imaging studies, participated in medical decision making and plan of care.ROS completed by me personally and pertinent positives fully documented  I have made any additions or clarifications directly to the above note. Agree with note above.  Patient neurologically stable and is now more alert and interactive and following commands on the right side the left hemiparesis persists.  She unfortunately did not do well after extubation yesterday and had trouble protecting the airway and liquid secretions but reintubated.  Plan for tracheostomy per critical care team.  Long discussion with patient and family at the bedside and answered questions.  Discussed with critical care medicine team This patient is critically ill and at significant risk of neurological worsening, death and care requires constant monitoring of vital signs, hemodynamics,respiratory and cardiac monitoring, extensive review of multiple databases, frequent neurological assessment, discussion with family, other specialists and medical decision making of high complexity.I have made any additions or clarifications directly to the above  note.This critical care time does not reflect procedure time, or teaching time or supervisory time of PA/NP/Med Resident etc but could involve care discussion time.  I spent 30 minutes of neurocritical care time  in the care of  this patient.      Eather Popp, MD Medical Director [facility] Pager: [phone] 02/26/2024 3:26 PM  To contact Stroke Continuity provider, please refer to [email or web address]. After hours, contact General Neurology

## 2024-02-26 NOTE — Procedures (Signed)
 Percutaneous Tracheostomy Procedure Note   Zuly Belkin  [id number]  30-Apr-1976  Date:02/26/24  Time:3:23 PM   Provider Performing:Elysia Grand C Claudene  Procedure: Percutaneous Tracheostomy with Bronchoscopic Guidance (68399)  Indication(s) Persistent resp failure  Consent Risks of the procedure as well as the alternatives and risks of each were explained to the patient and/or caregiver.  Consent for the procedure was obtained.  Anesthesia Etomidate , Versed , Fentanyl , Rocuronium    Time Out Verified patient identification, verified procedure, site/side was marked, verified correct patient position, special equipment/implants available, medications/allergies/relevant history reviewed, required imaging and test results available.   Sterile Technique Maximal sterile technique including sterile barrier drape, hand hygiene, sterile gown, sterile gloves, mask, hair covering.    Procedure Description Appropriate anatomy identified by palpation.  Patient's neck prepped and draped in sterile fashion.  1% lidocaine  with epinephrine  was used to anesthetize skin overlying neck.  1.5cm incision made and blunt dissection performed until tracheal rings could be easily palpated.   Then a size 6-0 Shiley tracheostomy was placed under bronchoscopic visualization using usual Seldinger technique and serial dilation.   Bronchoscope confirmed placement above the carina.  Tracheostomy was sutured in place with adhesive pad to protect skin under pressure.    Patient connected to ventilator.   Complications/Tolerance None, balloon entirely visualized within tracheal lumen from ETT Chest X-ray is ordered to confirm no post-procedural complication.   EBL Minimal   Specimen(s) None

## 2024-02-26 NOTE — Progress Notes (Signed)
 PCCM INTERVAL PROGRESS NOTE  Called to bedside to evaluate patient in respiratory failure. 48 year old female with ILD admitted with ICH. Extubated at the end of day shift and was doing well initially. Over the course of the night she became progressively lethargic, tachycardic and hypoxic.   Upon my arrival she is on BiPAP 100% FiO2 and satting in the mid 70s with HR 140s. Decision was made to reintubate.  BP 114/66   Pulse (!) 118   Temp 99.6 F (37.6 C) (Axillary)   Resp (!) 29   Ht 5' 7.01 (1.702 m)   Wt 121.9 kg   SpO2 100%   BMI 42.08 kg/m     Acute respiratory failure with hypoxia - Emergent intubation - Full vent support - ABG, and CXR now - Propofol  and PRN fentanyl  for RASS goal 0 to -1.   ICH - continue current care plan.    Deward Eastern, AGACNP-BC [facility]  See Amion for personal pager PCCM on call pager [phone] until 7pm. Please call Elink 7p-7a. [phone]  02/26/2024 12:54 AM

## 2024-02-26 NOTE — Progress Notes (Signed)
 SLP Cancellation Note  Patient Details Name: Katie Knight MRN: [id number] DOB: [birth year]   Cancelled treatment:       Reason Eval/Treat Not Completed: Patient with new tracheostomy. Orders for SLP eval and treat for PMSV and swallowing received. Will follow pt closely for readiness for SLP interventions as appropriate.     Damien Blumenthal, M.A., CCC-SLP Speech Language Pathology, Acute Rehabilitation Services  Secure Chat preferred [phone]  02/26/2024, 3:42 PM

## 2024-02-27 DIAGNOSIS — J9601 Acute respiratory failure with hypoxia: Secondary | ICD-10-CM | POA: Diagnosis not present

## 2024-02-27 DIAGNOSIS — I6200 Nontraumatic subdural hemorrhage, unspecified: Secondary | ICD-10-CM | POA: Diagnosis not present

## 2024-02-27 DIAGNOSIS — E871 Hypo-osmolality and hyponatremia: Secondary | ICD-10-CM

## 2024-02-27 DIAGNOSIS — R29718 NIHSS score 18: Secondary | ICD-10-CM

## 2024-02-27 DIAGNOSIS — A419 Sepsis, unspecified organism: Secondary | ICD-10-CM

## 2024-02-27 DIAGNOSIS — I6601 Occlusion and stenosis of right middle cerebral artery: Secondary | ICD-10-CM

## 2024-02-27 DIAGNOSIS — G935 Compression of brain: Secondary | ICD-10-CM | POA: Diagnosis not present

## 2024-02-27 DIAGNOSIS — I611 Nontraumatic intracerebral hemorrhage in hemisphere, cortical: Secondary | ICD-10-CM | POA: Diagnosis not present

## 2024-02-27 DIAGNOSIS — I619 Nontraumatic intracerebral hemorrhage, unspecified: Secondary | ICD-10-CM | POA: Diagnosis not present

## 2024-02-27 DIAGNOSIS — J690 Pneumonitis due to inhalation of food and vomit: Secondary | ICD-10-CM

## 2024-02-27 DIAGNOSIS — N179 Acute kidney failure, unspecified: Secondary | ICD-10-CM

## 2024-02-27 DIAGNOSIS — G936 Cerebral edema: Secondary | ICD-10-CM | POA: Diagnosis not present

## 2024-02-27 LAB — GLUCOSE, CAPILLARY
Glucose-Capillary: 115 mg/dL — ABNORMAL HIGH (ref 70–99)
Glucose-Capillary: 116 mg/dL — ABNORMAL HIGH (ref 70–99)
Glucose-Capillary: 118 mg/dL — ABNORMAL HIGH (ref 70–99)
Glucose-Capillary: 122 mg/dL — ABNORMAL HIGH (ref 70–99)
Glucose-Capillary: 128 mg/dL — ABNORMAL HIGH (ref 70–99)
Glucose-Capillary: 131 mg/dL — ABNORMAL HIGH (ref 70–99)

## 2024-02-27 LAB — CBC
HCT: 29.2 % — ABNORMAL LOW (ref 36.0–46.0)
Hemoglobin: 8.3 g/dL — ABNORMAL LOW (ref 12.0–15.0)
MCH: 29.4 pg (ref 26.0–34.0)
MCHC: 28.4 g/dL — ABNORMAL LOW (ref 30.0–36.0)
MCV: 103.5 fL — ABNORMAL HIGH (ref 80.0–100.0)
Platelets: 459 K/uL — ABNORMAL HIGH (ref 150–400)
RBC: 2.82 MIL/uL — ABNORMAL LOW (ref 3.87–5.11)
RDW: 13.5 % (ref 11.5–15.5)
WBC: 14.5 K/uL — ABNORMAL HIGH (ref 4.0–10.5)
nRBC: 0 % (ref 0.0–0.2)

## 2024-02-27 LAB — COMPREHENSIVE METABOLIC PANEL WITH GFR
ALT: 80 U/L — ABNORMAL HIGH (ref 0–44)
AST: 42 U/L — ABNORMAL HIGH (ref 15–41)
Albumin: 2.4 g/dL — ABNORMAL LOW (ref 3.5–5.0)
Alkaline Phosphatase: 85 U/L (ref 38–126)
Anion gap: 10 (ref 5–15)
BUN: 42 mg/dL — ABNORMAL HIGH (ref 6–20)
CO2: 27 mmol/L (ref 22–32)
Calcium: 9 mg/dL (ref 8.9–10.3)
Chloride: 118 mmol/L — ABNORMAL HIGH (ref 98–111)
Creatinine, Ser: 0.94 mg/dL (ref 0.44–1.00)
GFR, Estimated: >60 mL/min (ref >60)
Glucose, Bld: 163 mg/dL — ABNORMAL HIGH (ref 70–99)
Potassium: 4.6 mmol/L (ref 3.5–5.1)
Sodium: 155 mmol/L — ABNORMAL HIGH (ref 135–145)
Total Bilirubin: 0.6 mg/dL (ref 0.0–1.2)
Total Protein: 7.5 g/dL (ref 6.5–8.1)

## 2024-02-27 LAB — CYTOLOGY - NON PAP

## 2024-02-27 MED ORDER — LOSARTAN POTASSIUM 50 MG PO TABS
50.0000 mg | ORAL_TABLET | Freq: Once | ORAL | Status: AC
  Administered 2024-02-27: 50 mg
  Filled 2024-02-27: qty 1

## 2024-02-27 MED ORDER — WHITE PETROLATUM EX OINT
TOPICAL_OINTMENT | CUTANEOUS | Status: DC | PRN
  Filled 2024-02-27: qty 28.35

## 2024-02-27 MED ORDER — LOSARTAN POTASSIUM 50 MG PO TABS
100.0000 mg | ORAL_TABLET | Freq: Every day | ORAL | Status: DC
  Administered 2024-02-28 – 2024-02-29 (×2): 100 mg
  Filled 2024-02-27 (×2): qty 2

## 2024-02-27 NOTE — Progress Notes (Signed)
 Patient placed on ATC 12L 40% and tolerating well at this time. Vitals stable. RT will continue to assess as needed.

## 2024-02-27 NOTE — Progress Notes (Signed)
 NAME:  Katie Knight, MRN:  [id number], DOB:  June 17, 1976, LOS: 12 ADMISSION DATE:  02/15/2024, CONSULTATION DATE:  02/15/2024 REFERRING MD:  Dr. 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  Stroke code stroke CTA showed large intraparenchymal hemorrhage on the right side with some subdural subarachnoid hemorrhage as well.  Neurosurgery and neurology consulted for further management.  PCCM consulted for admission.  On ED arrival patient was seen mildly hypothermic, bradycardic, and hypertensive.  Lab work significant for potassium 3.3, glucose 216, hemoglobin 11 point.   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 Taken for crani and evacuation. Clipping of distal RMCA aneurysm  8/3 - Agitated overnight. BP remains at upper limit of goal.2L positive.Tmax 100.1. Tolerating SBT 8/6 repeat head CT with stable right temporal lobe hemorrhage with expected evolution of surrounding vasogenic edema and stable edematous changes and probable infarct in the posterior limb of the internal capsule 8/7 no acute issues overnight 8/9 head CT >> right temporal parenchymal  hemorrhage 4.1 cm stable in size.  No significant change vasogenic edema, encephalomalacia of the right temporal lobe.  Previously seen pneumocephalus has resolved 8/11: improved mentation follwing commands but still drowsy. Overnight fevers.  8/12: TEE no vegetation. Extubated. 8/13: reintubated. Bronch w Right sided mucus plugging.   Interim History / Subjective:  Patient underwent percutaneous tracheostomy yesterday, tolerated well Currently tolerating spontaneous breathing trial, will try trach collar later today Remained afebrile  Objective    Blood pressure 133/70, pulse 99, temperature 98.6 F (37 C), temperature source Axillary, resp. rate 18, height 5' 7 (1.702 m), weight 118.9 kg, SpO2 100%.    Vent Mode: PSV;CPAP FiO2 (%):  [40 %-50 %] 40 % Set Rate:  [20 bmp] 20 bmp Vt Set:  [500 mL] 500 mL PEEP:  [8 cmH20] 8 cmH20 Pressure Support:  [8 cmH20] 8 cmH20 Plateau Pressure:  [15 cmH20-19 cmH20] 15 cmH20   Intake/Output Summary (Last 24 hours) at 02/27/2024 0941 Last data filed at 02/27/2024 9188 Gross per 24 hour  Intake 2300 ml  Output 1750 ml  Net 550 ml   Filed Weights   02/22/24 0500 02/23/24 0400 02/27/24 0413  Weight: 123 kg 121.9 kg 118.9 kg    Examination:  General: Crtitically ill-appearing middle-aged morbidly obese female, lying on the bed HEENT: Status post right craniotomy with staples in place, eyes anicteric.  Status post trach, cortrak in place Neuro: Surgery, opens eyes with vocal stimuli, following simple commands, antigravity on right side, withdrawing in left lower extremity, flicker response to painful stimuli in left upper extremity Chest: Coarse breath sounds, no wheezes or rhonchi Heart: Regular rate and rhythm, no murmurs or gallops Abdomen: Soft, nondistended, bowel sounds present  Labs and images reviewed  Patient Lines/Drains/Airways Status  Active Line/Drains/Airways     Name Placement date Placement time Site Days   Peripheral  IV 02/17/24 20 G 1.88 Anterior;Left Forearm 02/17/24  1329  Forearm  10   Peripheral IV 02/26/24 22 G Posterior;Right Hand 02/26/24  0129  Hand  1   External Urinary Catheter 02/18/24  1427  --  9   Small Bore Feeding Tube 10 Fr. 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 status post craniectomy and evacuation of hematoma with aneurysm clipping Acute small right-sided subdural hemorrhage Cerebral edema with brain compression Brain herniation syndrome Induced hyponatremia Continue neuro watch Repeat head CT showing a stable right intraparenchymal hemorrhage Cerebral edema and brain compression has improved Continue seizure precautions TEE was negative for infective endocarditis Mycotic aneurysm was ruled out with negative blood culture and TEE Serum sodium is improving, currently down to 157 Off hypertonic saline Continue free water  flushes  Acute hypoxic and hypercapnic respiratory failure status post trach Sepsis due to aspiration pneumonia 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 as of April 2025 Patient failed extubation trial Underwent tracheostomy yesterday, tolerated well Currently tolerating spontaneous breathing trial, will try trach collar today Completed antibiotic therapy with ceftriaxone  Respiratory culture showed gram-positive cocci in pairs Avoid sedation CellCept is on hold  Anemia of critical illness H&H is slowly trending down Watch for signs of bleeding Transfuse if less than 7  HTN, controlled Blood pressure is well-controlled Stop Coreg  Continue amlodipine  and losartan  Maintain SBP 130-150  Acute kidney injury, prerenal Patient baseline serum creatinine  is around 0.7, trended up to 1.16 Monitor intake and output Avoid nephrotoxic agent  Constipation, improved Continue aggressive bowel regimen  Morbid obesity Diet and exercise counseling as appropriate   Best Practice (right click and Reselect all SmartList Selections daily)   Diet/type: tubefeeds.  DVT prophylaxis.  Subcu heparin  Pressure ulcer(s): See nursing notes GI prophylaxis: H2B Lines: N/A Foley:  NA Code Status:  full code  Last date of multidisciplinary goals of care discussion: Discussed with partner and mother about possible trial at extubation today after the TEE.   The patient is critically ill due to acute right intraparenchymal hemorrhage/acute respiratory failure with hypoxia and hypercapnia.  Critical care was necessary to treat or prevent imminent or life-threatening deterioration.  Critical care was time spent personally by me on the following activities: development of treatment plan with patient and/or surrogate as well as nursing, discussions with consultants, evaluation of patient's response to treatment, examination of patient, obtaining history from patient or surrogate, ordering and performing treatments and interventions, ordering and review of laboratory studies, ordering and review of radiographic studies, pulse oximetry, re-evaluation of patient's condition and participation in multidisciplinary rounds.   During this encounter critical care time was devoted to patient care services described in this note for 40 minutes.     Valinda Novas, MD [facility] See Amion for pager If no response to pager, please call [phone] until 7pm After 7pm, Please call E-link [phone]

## 2024-02-27 NOTE — Progress Notes (Addendum)
 STROKE TEAM PROGRESS NOTE    SIGNIFICANT HOSPITAL EVENTS: 8/2 presented with complaints of headache, nausea, vomiting, and altered mental status of large intraparenchymal hemorrhage. Taken for crani and evacuation. Clipping of distal RMCA aneurysm  8/11  Overnight fevers.  8/12: TEE no vegetation. Extubated. 8/13: Re-intubated overnight. Bronch w/ Right sided mucus plugging.  8/13: Tracheostomy placed in afternoon   SUBJECTIVE (INTERIM HISTORY)  Wife at bedside. Tracheostomy was placed yesterday s/p reintubation due to a failed extubation trial the night before. Neurologically, patient has no significant changes. Plan to wean of ventilatory support as tolerated.  OBJECTIVE  CBC    Component Value Date/Time   WBC 10.2 02/26/2024 0116   RBC 2.42 (L) 02/26/2024 0116   HGB 9.9 (L) 02/26/2024 0248   HCT 29.0 (L) 02/26/2024 0248   PLT 310 02/26/2024 0116   MCV 111.6 (H) 02/26/2024 0116   MCH 29.8 02/26/2024 0116   MCHC 26.7 (L) 02/26/2024 0116   RDW 14.0 02/26/2024 0116   LYMPHSABS 1.0 02/15/2024 0659   MONOABS 0.6 02/15/2024 0659   EOSABS 0.0 02/15/2024 0659   BASOSABS 0.0 02/15/2024 0659    BMET    Component Value Date/Time   NA 157 (H) 02/26/2024 0454   NA 137 05/07/2023 0845   K 4.5 02/26/2024 0454   CL 121 (H) 02/26/2024 0454   CO2 28 02/26/2024 0454   GLUCOSE 127 (H) 02/26/2024 0454   BUN 42 (H) 02/26/2024 0454   BUN 15 05/07/2023 0845   CREATININE 1.14 (H) 02/26/2024 0454   CREATININE 0.74 08/19/2023 1015   CALCIUM 9.3 02/26/2024 0454   EGFR 102 08/19/2023 1015   EGFR 91 05/07/2023 0845   GFRNONAA >60 02/26/2024 0454    IMAGING past 24 hours DG Chest Port 1 View Result Date: 02/26/2024 CLINICAL DATA:  Status post tracheostomy EXAM: PORTABLE CHEST 1 VIEW COMPARISON:  chest x-ray 02/26/2024 FINDINGS: There is a new tracheostomy with distal tip 4.4 cm above the carina enteric tube extends below the diaphragm. The right lung is now clear. There is minimal residual  right basilar atelectasis. There is no pleural effusion or pneumothorax. No acute fractures are seen. IMPRESSION: 1. New tracheostomy with distal tip 4.4 cm above the carina. 2. Minimal residual right basilar atelectasis. Electronically Signed   By: Greig Pique M.D.   On: 02/26/2024 16:34    Vitals:   02/27/24 0600 02/27/24 0700 02/27/24 0724 02/27/24 0800  BP: 119/65 135/74  133/70  Pulse: 98 99  99  Resp: 20 20  18   Temp:   98.6 F (37 C)   TempSrc:   Axillary   SpO2: 100% 100%  100%  Weight:      Height:         PHYSICAL EXAM General:  Well-nourished, well-developed middle-aged African-American lady in no acute distress, s/p  trach CV: Regular rate and rhythm on monitor Respiratory:  Regular, unlabored respirations on vent connected to trach     NEURO:  Patient now has tracheostomy in place. On examination, eyes open spontaneously and open halfway with stimulation. Pupils 2 mm bilaterally. Dysconjugate gaze present--right eye midline, left eye deviated to the left. Positive gag and cough reflexes. Strength 2/5 in RUE and RLE. Mild withdrawal to noxious stimuli in LUE, otherwise flaccid. Overall, neurological examination is unchanged from prior.    Most Recent NIH  Dizziness Present: No (08/14 0800) Headache Present: No (08/14 0800) Interval: Other (Comment) (08/14 0600) Level of Consciousness (1a.)   : Not alert, but arousable  by minor stimulation to obey, answer, or respond (08/14 0800) LOC Questions (1b. )   : Answers one question correctly (08/14 0800) LOC Commands (1c. )   : Performs one task correctly (08/14 0800) Best Gaze (2. )  : Partial gaze palsy (08/14 0800) Visual (3. )  : Partial hemianopia (08/14 0800) Facial Palsy (4. )    : Normal symmetrical movements (08/14 0800) Motor Arm, Left (5a. )   : No effort against gravity (08/14 0800) Motor Arm, Right (5b. ) : Drift (08/14 0800) Motor Leg, Left (6a. )  : No effort against gravity (08/14 0800) Motor Leg, Right  (6b. ) : Some effort against gravity (08/14 0800) Limb Ataxia (7. ): Absent (08/14 0800) Sensory (8. )  : Mild-to-moderate sensory loss, patient feels pinprick is less sharp or is dull on the affected side, or there is a loss of superficial pain with pinprick, but patient is aware of being touched (08/14 0800) Best Language (9. )  : Severe aphasia (08/14 0800) Dysarthria (10. ): Intubated or other physical barrier (08/14 0800) Extinction/Inattention (11.)   : Visual/tactile/auditory/spatial/personal inattention (08/14 0800) Complete NIHSS TOTAL: 18 (08/14 0800)      ASSESSMENT/PLAN [name] is a 48 y.o. female with history of interstitial lung disease on CellCept, hypertension, hyperlipidemia, asthma admitted for headache, nausea vomiting and altered mental status. No TNK given due to [facility].  She now has a tracheostomy in place; critical care plans to place a trach collar in the next few days. Will continue close monitoring in the ICU.   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  Nearly occluded right A1 branch.  Occluded proximal M1 branch with reconstitution of M2 branches in the sylvian fissure via collaterals 2D Echo EF > 75% LDL 74 HgbA1c 5.3 UDS negative Heparin  subcu for VTE prophylaxis No antithrombotic prior to admission, now on No antithrombotic due to ICH Ongoing aggressive stroke risk factor management Therapy recommendations: Pending Disposition: Pending   Cerebral edema Brain herniation CT head 8/2 showed intracranial mass effect with widespread cerebral edema, right uncal herniation, trapped left lateral ventricle, effaced basilar cisterns, possible Duret hemorrhage in the right brainstem Status post hematoma evacuation with Dr. Lanis CT head 8/3 substantial evacuation of right temporal lobe hematoma, decreased right SDH, basilar cistern predominant SAH and decreased mass effect, residual leftward midline shift 4 to 6 mm, stable volume new IVH without ventriculomegaly, right temporal lobe surgical vascular clips in place CT 8/4 Similar parenchymal hemorrhage in the right temporal lobe with surrounding edema and local mass effect, resulting in approximately  5 mm leftward midline shift (previously 6 mm). CT 8/6 stable right temporal lobe hemorrhage with expected evolution of surrounding vasogenic edema. Stable edematous changes and stable partial effacement of right lateral ventricle. No hydrocephalus.  CT 8/9 stable right temporal lobe hemorrhage with vasogenic edema and encephalomalacia.  Previously seen pneumocephalus has resolved.  Soft tissue swelling and fluid over the right frontotemporal craniotomy continues to improve. CT 8/13: Intraparenchymal hemorrhage involving the right temporal lobe similar in size but with decreased density compared to prior with improvement in surrounding vasogenic edema. No new acute intracranial abnormality  Status post 23.4% bolus  8/2 Now on 3% saline @ 75 -> 50-> off Na Q6 with goal 150-155 Na 137--141--140--149-155-159--165--161--160 - 163 -- 158 -- 157 Continue free water  100->200 cc every 4 hours   Respiratory failure Intubated on sedation Vent management per CCM Still on propofol , off fentanyl  8/12: Extubated 8/13: Respiratory failure overnight, reintubated. Bronch w/ Right sided mucus plugging. 8/13: Percutaneous Tracheostomy placed, connected to ventilator   ? 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, especially that pt is on cellcept with ILD Blood culture NGTD Tmax 101-100.5--100--100.7 Tachycardia improved Leukocytosis WBC 19.4--16.5--12.1 -- 10.8 -- 10.9 -- 10.2 On Rocephin    Hypertension Mildly hypertensive, or Norvasc  10 and losartan  100 Off Cleviprex    On amlodipine  10, hydralazine  50 Q6h, losartan  100 daily, coreg  12.5 bid BP goal less than 160 Long-term BP goal normotensive   Hyperlipidemia Home meds: None LDL 74, goal < 70 Consider low dose statin at discharge   Dysphagia NPO S/p cortrak On tube feeding at 50cc Free water  100->200 cc every 4h   Other Stroke Risk Factors Obesity, Body mass index is 37.97 kg/m.    Other Active Problems Asthma Tachycardia on coreg , improved  Hospital day # 12  Alan Maiden, MD PGY-1   I have personally obtained history,examined this patient, reviewed notes, independently viewed imaging studies, participated in medical decision making and plan of care.ROS completed by me personally and pertinent positives fully documented  I have made any additions or clarifications directly to the above note. Agree with note above. Had trach y`day . Still on vent support but plan to wean as tolerated. Mental status improved and following commands well though left hemiparesis persists.D/W  wife at bedside and PCCM team This patient is critically ill and at significant risk of neurological worsening, death and care requires constant monitoring of vital signs, hemodynamics,respiratory and cardiac monitoring, extensive review of multiple databases, frequent neurological assessment, discussion with family, other specialists and medical decision making of high complexity.I have made any additions or clarifications directly to the above note.This critical care time does not reflect procedure time, or teaching time or supervisory time of PA/NP/Med Resident etc but could involve care discussion time.  I spent 30 minutes of neurocritical care time  in the care of  this patient.      Eather Popp, MD Medical Director [facility] Pager: [phone] 02/28/2024 7:04 AM   To contact Stroke Continuity provider, please refer to [email or web address]. After hours, contact General Neurology

## 2024-02-27 NOTE — TOC Progression Note (Addendum)
 Transition of Care [facility]) - Progression Note    Patient Details  Name: Katie Knight MRN: [id number] Date of Birth: 1975-12-25  [facility] CM/SW Contact  Corean JAYSON Canary, RN Phone Number: 02/27/2024, 2:18 PM  Clinical Narrative:    Discussed possibility of LTAC with provider and nursing, patient did just get trach yesterday, was on PS today, however became tachypnic and was placed back on vent.  Both [facility] and [facility]  take the patients insurance.  Patient would not be ready today Will reassess tomorrow and send information if parties believe this would be the next level of care.         Barriers to Discharge: Continued Medical Work up               Expected Discharge Plan and Services                                               Social Drivers of Health (SDOH) Interventions SDOH Screenings   Food Insecurity: No Food Insecurity (02/21/2024)  Housing: Unknown (02/21/2024)  Transportation Needs: No Transportation Needs (02/21/2024)  Utilities: Not At Risk (02/21/2024)  Tobacco Use: Low Risk  (02/15/2024)    Readmission Risk Interventions     No data to display

## 2024-02-27 NOTE — Progress Notes (Signed)
 Nutrition Follow-up  DOCUMENTATION CODES:   Obesity unspecified  INTERVENTION:   Continue tube feeding via cortrak tube: Osmolite 1.5 at 50 ml/h (1200 ml per day)  Prosource TF20 60 ml BID  Provides 1960 kcal, 115 gm protein, 912 ml free water  daily  MVI with minerals daily   200 ml free water  every 4 hours Total free water : 2112 ml   NUTRITION DIAGNOSIS:   Inadequate oral intake related to inability to eat as evidenced by NPO status. Ongoing.   GOAL:   Patient will meet greater than or equal to 90% of their needs Met with TF at goal rate   MONITOR:   TF tolerance, Diet advancement  REASON FOR ASSESSMENT:   Consult Enteral/tube feeding initiation and management  ASSESSMENT:   Pt with PMH of Interstitial Lung Disease with Usual Interstitial Pneumonia treated with CellCept as of 10/2023, HTN, HLD, osteoporosis, vitamin D  deficiency and asthma admitted with large IPH.   Pt discussed during ICU rounds and with RN and MD.  Pt on on trach collar trial after having trach placed yesterday 8/13.  Na remains elevated, off hypertonic saline and FWF increased.   8/2 - s/p R temporal craniotomy, clipping of distal R MCA aneurysm  8/4 - s/p cortrak placement; tip in duodenum per xray however suspect this is inaccurate since pt has hx of heterotaxy 8/12 - extubated 8/13 - re-intubated s/p bronch with right sided mucus plugging, s/p trach placement   Medications reviewed and include: pepcid , MVI with minerals, miralax , senokot-s  Bowel regimen held today  Labs reviewed:  Na 155 - off hypertonic saline and FWF increased  A1C 5.3 CBG's: 115-131  Current weight: 118.9 kg Admission weight: 110 kg  Generalized edema   Diet Order:   Diet Order             Diet NPO time specified  Diet effective now                   EDUCATION NEEDS:   Not appropriate for education at this time  Skin:  Skin Assessment: Reviewed RN Assessment (R head incision)  Last BM:  8/14  Type 7 x 2  Height:   Ht Readings from Last 1 Encounters:  02/27/24 5' 7 (1.702 m)    Weight:   Wt Readings from Last 1 Encounters:  02/27/24 118.9 kg    BMI:  Body mass index is 41.05 kg/m.  Estimated Nutritional Needs:   Kcal:  1900-2100  Protein:  105-120 grams  Fluid:  >1.9L/day  Dolora Ridgely P., RD, LDN, CNSC See AMiON for contact information

## 2024-02-28 DIAGNOSIS — I6200 Nontraumatic subdural hemorrhage, unspecified: Secondary | ICD-10-CM | POA: Diagnosis not present

## 2024-02-28 DIAGNOSIS — G935 Compression of brain: Secondary | ICD-10-CM | POA: Diagnosis not present

## 2024-02-28 DIAGNOSIS — I611 Nontraumatic intracerebral hemorrhage in hemisphere, cortical: Secondary | ICD-10-CM | POA: Diagnosis not present

## 2024-02-28 DIAGNOSIS — E871 Hypo-osmolality and hyponatremia: Secondary | ICD-10-CM | POA: Diagnosis not present

## 2024-02-28 DIAGNOSIS — G936 Cerebral edema: Secondary | ICD-10-CM | POA: Diagnosis not present

## 2024-02-28 DIAGNOSIS — J9601 Acute respiratory failure with hypoxia: Secondary | ICD-10-CM | POA: Diagnosis not present

## 2024-02-28 DIAGNOSIS — Z930 Tracheostomy status: Secondary | ICD-10-CM

## 2024-02-28 DIAGNOSIS — R29720 NIHSS score 20: Secondary | ICD-10-CM | POA: Diagnosis not present

## 2024-02-28 LAB — CBC
HCT: 33.4 % — ABNORMAL LOW (ref 36.0–46.0)
Hemoglobin: 9.2 g/dL — ABNORMAL LOW (ref 12.0–15.0)
MCH: 28.6 pg (ref 26.0–34.0)
MCHC: 27.5 g/dL — ABNORMAL LOW (ref 30.0–36.0)
MCV: 103.7 fL — ABNORMAL HIGH (ref 80.0–100.0)
Platelets: 412 K/uL — ABNORMAL HIGH (ref 150–400)
RBC: 3.22 MIL/uL — ABNORMAL LOW (ref 3.87–5.11)
RDW: 13.7 % (ref 11.5–15.5)
WBC: 13.8 K/uL — ABNORMAL HIGH (ref 4.0–10.5)
nRBC: 0 % (ref 0.0–0.2)

## 2024-02-28 LAB — COMPREHENSIVE METABOLIC PANEL WITH GFR
ALT: 99 U/L — ABNORMAL HIGH (ref 0–44)
AST: 55 U/L — ABNORMAL HIGH (ref 15–41)
Albumin: 2.6 g/dL — ABNORMAL LOW (ref 3.5–5.0)
Alkaline Phosphatase: 103 U/L (ref 38–126)
Anion gap: 11 (ref 5–15)
BUN: 47 mg/dL — ABNORMAL HIGH (ref 6–20)
CO2: 27 mmol/L (ref 22–32)
Calcium: 9.3 mg/dL (ref 8.9–10.3)
Chloride: 120 mmol/L — ABNORMAL HIGH (ref 98–111)
Creatinine, Ser: 1.1 mg/dL — ABNORMAL HIGH (ref 0.44–1.00)
GFR, Estimated: >60 mL/min (ref >60)
Glucose, Bld: 125 mg/dL — ABNORMAL HIGH (ref 70–99)
Potassium: 5 mmol/L (ref 3.5–5.1)
Sodium: 158 mmol/L — ABNORMAL HIGH (ref 135–145)
Total Bilirubin: 0.7 mg/dL (ref 0.0–1.2)
Total Protein: 8.2 g/dL — ABNORMAL HIGH (ref 6.5–8.1)

## 2024-02-28 LAB — GLUCOSE, CAPILLARY
Glucose-Capillary: 140 mg/dL — ABNORMAL HIGH (ref 70–99)
Glucose-Capillary: 122 mg/dL — ABNORMAL HIGH (ref 70–99)
Glucose-Capillary: 127 mg/dL — ABNORMAL HIGH (ref 70–99)

## 2024-02-28 MED ORDER — FREE WATER
200.0000 mL | Status: DC
  Administered 2024-02-28 – 2024-03-02 (×23): 200 mL

## 2024-02-28 NOTE — Progress Notes (Signed)
 NAME:  Katie Knight, MRN:  [id number], DOB:  [birth year], LOS: 13 ADMISSION DATE:  02/15/2024, CONSULTATION DATE:  02/15/2024 REFERRING MD:  Dr. 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  Stroke code stroke CTA showed large intraparenchymal hemorrhage on the right side with some subdural subarachnoid hemorrhage as well.  Neurosurgery and neurology consulted for further management.  PCCM consulted for admission.  On ED arrival patient was seen mildly hypothermic, bradycardic, and hypertensive.  Lab work significant for potassium 3.3, glucose 216, hemoglobin 11 point.   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 Taken for crani and evacuation. Clipping of distal RMCA aneurysm  8/3 - Agitated overnight. BP remains at upper limit of goal.2L positive.Tmax 100.1. Tolerating SBT 8/6 repeat head CT with stable right temporal lobe hemorrhage with expected evolution of surrounding vasogenic edema and stable edematous changes and probable infarct in the posterior limb of the internal capsule 8/7 no acute issues overnight 8/9 head CT >> right temporal parenchymal  hemorrhage 4.1 cm stable in size.  No significant change vasogenic edema, encephalomalacia of the right temporal lobe.  Previously seen pneumocephalus has resolved 8/11: improved mentation follwing commands but still drowsy. Overnight fevers.  8/12: TEE no vegetation. Extubated. 8/13: reintubated. Bronch w Right sided mucus plugging.  Underwent trach 8/14 tolerated trach collar for few hours before switch back to full vent support due to increased work of breathing  Interim History / Subjective:  Tolerated trach collar for few hours yesterday before she was put back on full vent support due to increased work of breathing Started spiking low-grade fever though white count is trending down Currently on pressure support, will try trach collar again today  Objective    Blood pressure 131/88, pulse (!) 119, temperature 99 F (37.2 C), temperature source Axillary, resp. rate (!) 23, height 5' 7 (1.702 m), weight 118.6 kg, SpO2 100%.    Vent Mode: PSV;CPAP FiO2 (%):  [40 %] 40 % Set Rate:  [20 bmp] 20 bmp Vt Set:  [500 mL-5000 mL] 5000 mL PEEP:  [5 cmH20] 5 cmH20 Pressure Support:  [10 cmH20] 10 cmH20 Plateau Pressure:  [14 cmH20] 14 cmH20   Intake/Output Summary (Last 24 hours) at 02/28/2024 0848 Last data filed at 02/28/2024 9191 Gross per 24 hour  Intake 2400 ml  Output 2000 ml  Net 400 ml   Filed Weights   02/23/24 0400 02/27/24 0413 02/28/24 0500  Weight: 121.9 kg 118.9 kg 118.6 kg    Examination:  General: Crtitically ill-appearing morbidly obese middle-aged female HEENT: [place]/AT, eyes anicteric, cortrak in place Neuro: Lethargic, opens eyes with vocal stimuli, following simple commands, pupils equal bilaterally reactive to light.  Antigravity on right side, withdrawing to painful stimuli on left side Chest: Coarse breath sounds, no wheezes  or rhonchi Heart: Regular rate and rhythm, no murmurs or gallops Abdomen: Soft, nondistended, bowel sounds present  Labs and images  reviewed  Patient Lines/Drains/Airways Status     Active Line/Drains/Airways     Name Placement date Placement time Site Days   Peripheral IV 02/17/24 20 G 1.88 Anterior;Left Forearm 02/17/24  1329  Forearm  11   Peripheral IV 02/26/24 22 G Posterior;Right Hand 02/26/24  0129  Hand  2   External Urinary Catheter 02/18/24  1427  --  10   Small Bore Feeding Tube 10 Fr. 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 and evacuation of hematoma with aneurysm clipping Acute small right-sided subdural hemorrhage Cerebral edema with brain compression Brain herniation syndrome Induced hyponatremia Continue neuro watch Repeat head CT showing a stable right intraparenchymal hemorrhage Cerebral edema and brain compression, slowly improving Continue seizure precautions TEE was negative for infective endocarditis, mycotic aneurysm was ruled out Serum sodium trended up to 158 this morning Remain off hypertonic saline Continue free water  flushes  Acute hypoxic and hypercapnic respiratory failure status post trach Sepsis due to aspiration pneumonia 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 as of April 2025 Patient failed extubation trial, underwent tracheostomy on 8/13 Tolerated trach collar for 3 hours yesterday before went on full vent support for increased work of breathing Tolerating spontaneous breathing trial, will try trach collar again today Completed antibiotic therapy with ceftriaxone  Respiratory culture showed gram-positive cocci in pairs CellCept is on hold TOC consulted for LTAC placement  Anemia of critical  illness H&H is stable now Watch for signs of bleeding Transfuse if less than 7  HTN, controlled Blood pressure is well-controlled Continue amlodipine  and losartan  Maintain SBP 130-150  Acute kidney injury, prerenal Patient baseline serum creatinine is around 0.7, trended up to 1.16 Monitor intake and output Avoid nephrotoxic agent  Morbid obesity Diet and exercise counseling as appropriate   Best Practice (right click and Reselect all SmartList Selections daily)   Diet/type: tubefeeds.  DVT prophylaxis.  Subcu heparin  Pressure ulcer(s): See nursing notes GI prophylaxis: H2B Lines: N/A Foley:  NA Code Status:  full code Last date of multidisciplinary goals of care discussion: 8/15: Patient's family was updated at bedside, decision was to continue full scope of care  The patient is critically ill due to acute right intraparenchymal hemorrhage/acute respiratory failure with hypoxia and hypercapnia.  Critical care was necessary to treat or prevent imminent or life-threatening deterioration.  Critical care was time spent personally by me on the following activities: development of treatment plan with patient and/or surrogate as well as nursing, discussions with consultants, evaluation of patient's response to treatment, examination of patient, obtaining history from patient or surrogate, ordering and performing treatments and interventions, ordering and review of laboratory studies, ordering and review of radiographic studies, pulse oximetry, re-evaluation of patient's condition and participation in multidisciplinary rounds.   During this encounter critical care time was devoted to patient care services described in this note for 38 minutes.     Valinda Novas, MD  Pulmonary Critical Care See Amion  for pager If no response to pager, please call [phone] until 7pm After 7pm, Please call E-link [phone]

## 2024-02-28 NOTE — Progress Notes (Signed)
 Helping unit RT- RN called due to pt with increased WOB and tachpnea on ATC.  Pt placed back on vent- rest mode.  No distress currently noted, pt appears more comfortable on vent rest mode.  RN at bedside now.

## 2024-02-28 NOTE — TOC Progression Note (Addendum)
 Transition of Care [facility]) - Progression Note    Patient Details  Name: Katie Knight MRN: [id number] Date of Birth: Aug 22, 1975  [facility] CM/SW Contact  Corean JAYSON Canary, RN Phone Number: 02/28/2024, 12:00 PM  Clinical Narrative:      Discharge planning: Patient on vent at night continuing needing suctioning. LTAC  conversation between providers. Sent message to DJ from [place] from [facility] to speak to family about their programs.  1250 word received that spouse would like [facility] and they will be doing authorization after speaking with provider.   Expected Discharge Plan: Long Term Acute Care (LTAC) Barriers to Discharge: Continued Medical Work up               Expected Discharge Plan and Services                                               Social Drivers of Health (SDOH) Interventions SDOH Screenings   Food Insecurity: No Food Insecurity (02/21/2024)  Housing: Unknown (02/21/2024)  Transportation Needs: No Transportation Needs (02/21/2024)  Utilities: Not At Risk (02/21/2024)  Tobacco Use: Low Risk  (02/15/2024)    Readmission Risk Interventions     No data to display

## 2024-02-28 NOTE — Progress Notes (Signed)
 Physical Therapy Treatment Patient Details Name: Katie Knight MRN: [id number] DOB: 04-25-1976 Today's Date: 02/28/2024   History of Present Illness Katie Knight is a 48 year old female who presented to the ED with complaints of headache with associated nausea and vomiting. CTA showed large R IPH with some subdural subarachnoid hemorrhage as well. Pt reintubated on 8/11, trach on 8/13.  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 Pt unable to participate other than opening eyes to command for seconds at a time.  Not following commands for exercises. Positioned in chair position.  Will continue to follow.  Pt currently with functional limitations due to the deficits listed below (see PT Problem List). Pt will benefit from acute skilled PT to increase their independence and safety with mobility to allow discharge.       If plan is discharge home, recommend the following: Two people to help with bathing/dressing/bathroom;Two people to help with walking and/or transfers;Help with stairs or ramp for entrance;Assist for transportation   Can travel by private vehicle        Equipment Recommendations   (TBD)    Recommendations for Other Services       Precautions / Restrictions Precautions Precautions: Fall Precaution/Restrictions Comments: intubated/vent Restrictions Weight Bearing Restrictions Per Provider Order: No     Mobility  Bed Mobility               General bed mobility comments: Nurse states pt just went back on vent - 40% FiO2 and PEEP 5.  Pt fatigued and was unable to follow commands for execise at bed level.  Did open eyes to command.  Also positioned head to neutral as pt maintaining head rotated to right.  Left pt in chair position.    Transfers                   General transfer comment: unsafe at this time     Ambulation/Gait               General Gait Details: unable this date   Stairs             Wheelchair Mobility     Tilt Bed    Modified Rankin (Stroke Patients Only)       Balance Overall balance assessment: Needs assistance (pt dependent on bed to maintain upright posture via chair position of bed)                                          Communication Communication Communication: Impaired Factors Affecting Communication: Trach/intubated  Cognition Arousal: Lethargic (opened eyes to name but couldn't maintain) Behavior During Therapy: Flat affect   PT - Cognitive impairments: Difficult to assess Difficult to assess due to: Intubated                     PT - Cognition Comments: pt lethargic, did open eyes to name Following commands: Impaired      Cueing Cueing Techniques: Verbal cues, Tactile cues  Exercises General Exercises - Upper Extremity Shoulder Flexion: PROM, Both, 10 reps, Supine Elbow Flexion: PROM, Both, 10 reps, Supine Wrist Flexion: PROM, Both, 5 reps, Supine General Exercises - Lower Extremity Ankle Circles/Pumps: PROM, Both, 5 reps, Supine (chair position, PROM to  L LE) Heel Slides: PROM, Both, Supine    General Comments General comments (skin integrity, edema, etc.): VSS      Pertinent Vitals/Pain Pain Assessment Pain Assessment: No/denies pain (unable to vocalize) Breathing: normal Negative Vocalization: none Facial Expression: smiling or inexpressive Body Language: relaxed Consolability: no need to console PAINAD Score: 0    Home Living                          Prior Function            PT Goals (current goals can now be found in the care plan section) Acute Rehab PT Goals Patient Stated Goal: didn't state Progress towards PT goals: Not progressing toward goals - comment (Lethargy)    Frequency    Min 2X/week      PT Plan      Co-evaluation               AM-PAC PT 6 Clicks Mobility   Outcome Measure  Help needed turning from your back to your side while in a flat bed without using bedrails?: Total Help needed moving from lying on your back to sitting on the side of a flat bed without using bedrails?: Total Help needed moving to and from a bed to a chair (including a wheelchair)?: Total Help needed standing up from a chair using your arms (e.g., wheelchair or bedside chair)?: Total Help needed to walk in hospital room?: Total Help needed climbing 3-5 steps with a railing? : Total 6 Click Score: 6    End of Session Equipment Utilized During Treatment: Oxygen  (trach on vent) Activity Tolerance: Patient limited by fatigue Patient left: in bed;with call bell/phone within reach;with family/visitor present;with bed alarm set (wife and mom present) Nurse Communication: Mobility status;Need for lift equipment PT Visit Diagnosis: Unsteadiness on feet (R26.81);Muscle weakness (generalized) (M62.81);Difficulty in walking, not elsewhere classified (R26.2)     Time: 8769-8755 PT Time Calculation (min) (ACUTE ONLY): 14 min  Charges:    $Therapeutic Exercise: 8-22 mins PT General Charges $$ ACUTE PT VISIT: 1 Visit                     Marithza Malachi M,PT Acute Rehab Services [phone]    Stephane JULIANNA Bevel 02/28/2024, 1:45 PM

## 2024-02-28 NOTE — Progress Notes (Addendum)
 STROKE TEAM PROGRESS NOTE    SIGNIFICANT HOSPITAL EVENTS: 8/2 presented with complaints of headache, nausea, vomiting, and altered mental status of large intraparenchymal hemorrhage. Taken for crani and evacuation. Clipping of distal RMCA aneurysm  8/11  Overnight fevers.  8/12: TEE no vegetation. Extubated. 8/13: Re-intubated overnight. Bronch w/ Right sided mucus plugging.  8/13: Tracheostomy placed in afternoon 8/14:  Tolerated trach collar for few hours before switch back to full vent support due to increased work of breathing.   SUBJECTIVE (INTERIM HISTORY)  Patient's wife at bedside. The patient has been off full ventilation for approximately half a day since trach collar placement yesterday. Exam shows increased WOB. Neurologically, there are no significant changes; slowed movements likely reflect fatigue from respiratory effort. Family consulted regarding accumulated secretions, which may be contributing to increased WOB and risk for mucus plugging.  12:15 PM: Patient developed increased WOB and tachypnea on ATC and was returned to ventilatory support. Family has decided to pursue transfer to long-term acute care (LTAC).  OBJECTIVE  CBC    Component Value Date/Time   WBC 13.8 (H) 02/28/2024 0626   RBC 3.22 (L) 02/28/2024 0626   HGB 9.2 (L) 02/28/2024 0626   HCT 33.4 (L) 02/28/2024 0626   PLT 412 (H) 02/28/2024 0626   MCV 103.7 (H) 02/28/2024 0626   MCH 28.6 02/28/2024 0626   MCHC 27.5 (L) 02/28/2024 0626   RDW 13.7 02/28/2024 0626   LYMPHSABS 1.0 02/15/2024 0659   MONOABS 0.6 02/15/2024 0659   EOSABS 0.0 02/15/2024 0659   BASOSABS 0.0 02/15/2024 0659    BMET    Component Value Date/Time   NA 158 (H) 02/28/2024 0626   NA 137 05/07/2023 0845   K 5.0 02/28/2024 0626   CL 120 (H) 02/28/2024 0626   CO2 27 02/28/2024 0626   GLUCOSE 125 (H) 02/28/2024 0626   BUN 47 (H) 02/28/2024 0626   BUN 15 05/07/2023 0845   CREATININE 1.10 (H) 02/28/2024 0626   CREATININE 0.74  08/19/2023 1015   CALCIUM 9.3 02/28/2024 0626   EGFR 102 08/19/2023 1015   EGFR 91 05/07/2023 0845   GFRNONAA >60 02/28/2024 0626    IMAGING past 24 hours No results found.  Vitals:   02/28/24 0600 02/28/24 0700 02/28/24 0801 02/28/24 0908  BP: 121/77 131/88    Pulse: (!) 119   (!) 120  Resp: (!) 24 (!) 23  (!) 25  Temp:   99 F (37.2 C)   TempSrc:   Axillary   SpO2: 100% 100%  100%  Weight:      Height:        PHYSICAL EXAM General:  Well-nourished, well-developed middle-aged African-American lady in no acute distress, s/p  trach CV: Regular rate and rhythm on monitor Respiratory: Regular labored respirations with trach     NEURO:  Patient now has tracheostomy in place. On examination, eyes open spontaneously and partially with stimulation. Pupils 2 mm bilaterally. Dysconjugate gaze present--right eye midline, left eye deviated to the left. Positive gag and cough reflexes. Strength 2/5 in RUE and RLE. Mild withdrawal to noxious stimuli in LUE; otherwise flaccid. Patient demonstrates increased work of breathing with accessory muscle use. Overall, neurological examination is otherwise unchanged from prior. Most Recent NIH  Dizziness Present: No (08/15 0600) Headache Present: No (08/15 0600) Interval: Other (Comment) (08/15 0600) Level of Consciousness (1a.)   : Not alert, but arousable by minor stimulation to obey, answer, or respond (08/15 0600) LOC Questions (1b. )   : Answers  neither question correctly (08/15 0600) LOC Commands (1c. )   : Performs one task correctly (08/15 0600) Best Gaze (2. )  : Partial gaze palsy (08/15 0600) Visual (3. )  : Partial hemianopia (08/15 0600) Facial Palsy (4. )    : Normal symmetrical movements (08/15 0600) Motor Arm, Left (5a. )   : No effort against gravity (08/15 0600) Motor Arm, Right (5b. ) : Some effort against gravity (08/15 0600) Motor Leg, Left (6a. )  : No effort against gravity (08/15 0600) Motor Leg, Right (6b. ) : Some  effort against gravity (08/15 0600) Limb Ataxia (7. ): Absent (08/15 0600) Sensory (8. )  : Normal, no sensory loss (08/15 0600) Best Language (9. )  : Mute, global aphasia (08/15 0600) Dysarthria (10. ): Intubated or other physical barrier (08/15 0600) Extinction/Inattention (11.)   : Visual/tactile/auditory/spatial/personal inattention (08/15 0600) Complete NIHSS TOTAL: 20 (08/15 0600)    ASSESSMENT/PLAN [name] is a 48 y.o. female with history of interstitial lung disease on CellCept, hypertension, hyperlipidemia, asthma admitted for headache, nausea vomiting and altered mental status. No TNK given due to [facility].  She now has a tracheostomy in place; increased work of breathing on trach collar, intermit need for ventilatory support; recurrent mucous plugging contributing to respiratory compromise.  Family considering LTAC for ongoing pulmonary support and care. Will continue to monitor in ICU with possible discharge early next week.    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  Nearly occluded right A1 branch.  Occluded proximal M1 branch with reconstitution of M2 branches in the sylvian fissure via collaterals 2D Echo EF > 75% LDL 74 HgbA1c 5.3 UDS negative Heparin  subcu for VTE prophylaxis No antithrombotic prior to admission, now on No antithrombotic due to ICH Ongoing aggressive stroke risk factor management Therapy recommendations: Pending Disposition: LTAC   Cerebral edema Brain herniation CT head 8/2 showed intracranial mass effect with widespread cerebral edema, right uncal herniation, trapped left lateral ventricle, effaced basilar cisterns, possible Duret hemorrhage in the right brainstem Status post hematoma evacuation with Dr. Lanis CT head 8/3 substantial evacuation of right temporal lobe hematoma, decreased right SDH, basilar cistern predominant SAH and decreased mass effect, residual leftward midline shift 4 to 6 mm, stable volume new IVH without ventriculomegaly, right temporal lobe surgical vascular clips in place CT 8/4 Similar parenchymal hemorrhage in the right temporal lobe with surrounding edema and local mass effect, resulting in approximately 5 mm leftward midline shift (previously 6 mm). CT 8/6 stable right temporal lobe hemorrhage with expected evolution of surrounding vasogenic edema. Stable  edematous changes and stable partial effacement of right lateral ventricle. No hydrocephalus.  CT 8/9 stable right temporal lobe hemorrhage with vasogenic edema and encephalomalacia.  Previously seen pneumocephalus has resolved.  Soft tissue swelling and fluid over the right frontotemporal craniotomy continues to improve. CT 8/13: Intraparenchymal hemorrhage involving the right temporal lobe similar in size but with decreased density compared to prior with improvement in surrounding vasogenic edema. No new acute intracranial abnormality  Status post 23.4% bolus  8/2 Now on 3% saline @ 75 -> 50-> off Na Q6 with goal 150-155 Na 137--141--140--149-155-159--165--161--160 - 163 -- 158 -- 157 -- 158 Continue free water  100->200 cc every 4 hours   Respiratory failure Intubated on sedation Vent management per CCM Still on propofol , off fentanyl  8/12: Extubated. Respiratory failure overnight, reintubated. Bronch w/ Right sided mucus plugging. 8/13: Percutaneous Tracheostomy placed, connected to ventilator 8/14 Tolerated trach collar for few hours before switch back to full vent support due to increased work of breathing 8/15, 0930: Trach collar in place for 1/2 day, showing signs of increased WOB 8/15, 1250: Increased WOB and tachpnea on ATC. Pt placed back on vent- rest mode.    ? 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, especially that pt is on cellcept with ILD Blood culture NGTD Tmax 101-100.5--100--100.7 Tachycardia improved Leukocytosis WBC 19.4--16.5--12.1 -- 10.8 -- 10.9 -- 10.2  -- 14.5 -- 13.8   Hypertension Mildly hypertensive, or Norvasc  10 and losartan  100 Off Cleviprex    On amlodipine  10, hydralazine  50 Q6h, losartan  100 daily, coreg  12.5 bid BP goal less than 160 Long-term BP goal normotensive   Hyperlipidemia Home meds: None LDL 74, goal < 70 Consider low dose statin at discharge   Dysphagia NPO S/p cortrak On tube feeding at 50cc Free water  100->200 cc every 4h   Other Stroke Risk Factors Obesity, Body mass index is 37.97 kg/m.    Other Active Problems Asthma Tachycardia on coreg , improved  Hospital day # 13  Alan Maiden, MD PGY-1  I have personally obtained history,examined this patient, reviewed notes, independently viewed imaging studies, participated in medical decision making and plan of care.ROS completed by me personally and  pertinent positives fully documented  I have made any additions or clarifications directly to the above note. Agree with note above.  Patient's neurological exam shows slight improvement but continues to have dense left hemiplegia.  Continue to wean off ventilatory support with trach collar when appropriate. Patient has a lot of secretions and may take a long time and considering LTAC is a good option.  Discussed with patient's wife and mother at bedside and answered questions.  Discussed with Dr.Chand critical care medicine.  Eather Popp, MD Medical Director [facility] Pager: [phone] 02/28/2024 2:52 PM   To contact Stroke Continuity provider, please refer to [email or web address]. After hours, contact General Neurology

## 2024-02-29 ENCOUNTER — Inpatient Hospital Stay (HOSPITAL_COMMUNITY)

## 2024-02-29 DIAGNOSIS — G935 Compression of brain: Secondary | ICD-10-CM | POA: Diagnosis not present

## 2024-02-29 DIAGNOSIS — J9601 Acute respiratory failure with hypoxia: Secondary | ICD-10-CM | POA: Diagnosis not present

## 2024-02-29 DIAGNOSIS — E871 Hypo-osmolality and hyponatremia: Secondary | ICD-10-CM | POA: Diagnosis not present

## 2024-02-29 DIAGNOSIS — I611 Nontraumatic intracerebral hemorrhage in hemisphere, cortical: Secondary | ICD-10-CM | POA: Diagnosis not present

## 2024-02-29 LAB — COMPREHENSIVE METABOLIC PANEL WITH GFR
ALT: 94 U/L — ABNORMAL HIGH (ref 0–44)
AST: 47 U/L — ABNORMAL HIGH (ref 15–41)
Albumin: 2.5 g/dL — ABNORMAL LOW (ref 3.5–5.0)
Alkaline Phosphatase: 101 U/L (ref 38–126)
Anion gap: 9 (ref 5–15)
BUN: 44 mg/dL — ABNORMAL HIGH (ref 6–20)
CO2: 25 mmol/L (ref 22–32)
Calcium: 8.9 mg/dL (ref 8.9–10.3)
Chloride: 119 mmol/L — ABNORMAL HIGH (ref 98–111)
Creatinine, Ser: 1.06 mg/dL — ABNORMAL HIGH (ref 0.44–1.00)
GFR, Estimated: >60 mL/min (ref >60)
Glucose, Bld: 134 mg/dL — ABNORMAL HIGH (ref 70–99)
Potassium: 4.6 mmol/L (ref 3.5–5.1)
Sodium: 153 mmol/L — ABNORMAL HIGH (ref 135–145)
Total Bilirubin: 0.5 mg/dL (ref 0.0–1.2)
Total Protein: 8 g/dL (ref 6.5–8.1)

## 2024-02-29 LAB — CULTURE, RESPIRATORY W GRAM STAIN

## 2024-02-29 LAB — CBC
HCT: 31.5 % — ABNORMAL LOW (ref 36.0–46.0)
Hemoglobin: 8.9 g/dL — ABNORMAL LOW (ref 12.0–15.0)
MCH: 29.4 pg (ref 26.0–34.0)
MCHC: 28.3 g/dL — ABNORMAL LOW (ref 30.0–36.0)
MCV: 104 fL — ABNORMAL HIGH (ref 80.0–100.0)
Platelets: 517 K/uL — ABNORMAL HIGH (ref 150–400)
RBC: 3.03 MIL/uL — ABNORMAL LOW (ref 3.87–5.11)
RDW: 14.1 % (ref 11.5–15.5)
WBC: 13.5 K/uL — ABNORMAL HIGH (ref 4.0–10.5)
nRBC: 0 % (ref 0.0–0.2)

## 2024-02-29 MED ORDER — CARVEDILOL 3.125 MG PO TABS: 6.2500 mg | ORAL_TABLET | Freq: Two times a day (BID) | ORAL | Status: DC

## 2024-02-29 MED ORDER — LOSARTAN POTASSIUM 50 MG PO TABS
50.0000 mg | ORAL_TABLET | Freq: Every day | ORAL | Status: DC
  Administered 2024-03-01 – 2024-03-03 (×3): 50 mg
  Filled 2024-02-29 (×3): qty 1

## 2024-02-29 MED ORDER — FENTANYL CITRATE PF 50 MCG/ML IJ SOSY
PREFILLED_SYRINGE | INTRAMUSCULAR | Status: AC
Start: 2024-02-29 — End: 2024-02-29
  Filled 2024-02-29: qty 1

## 2024-02-29 MED ORDER — CEFAZOLIN SODIUM-DEXTROSE 2-4 GM/100ML-% IV SOLN
2.0000 g | Freq: Three times a day (TID) | INTRAVENOUS | Status: DC
  Administered 2024-02-29 – 2024-03-03 (×9): 2 g via INTRAVENOUS
  Filled 2024-02-29 (×9): qty 100

## 2024-02-29 MED ORDER — FENTANYL CITRATE PF 50 MCG/ML IJ SOSY
50.0000 ug | PREFILLED_SYRINGE | Freq: Once | INTRAMUSCULAR | Status: AC
  Administered 2024-02-29: 50 ug via INTRAVENOUS

## 2024-02-29 MED ORDER — CARVEDILOL 3.125 MG PO TABS
6.2500 mg | ORAL_TABLET | Freq: Two times a day (BID) | ORAL | Status: DC
  Administered 2024-02-29 – 2024-03-04 (×8): 6.25 mg
  Filled 2024-02-29 (×8): qty 2

## 2024-02-29 NOTE — Progress Notes (Signed)
 SLP Cancellation Note  Patient Details Name: Linsey Arteaga MRN: [id number] DOB: 05-02-1976   Cancelled treatment:       Reason Eval/Treat Not Completed: Medical issues which prohibited therapy; remains on vent; will continue to monitor   Mitzie DEL. MA, CCC-SLP Acute Rehabilitation Services   02/29/2024, 1:22 PM

## 2024-02-29 NOTE — Progress Notes (Addendum)
 NAME:  Katie Knight, MRN:  [id number], DOB:  March 14, 1976, LOS: 14 ADMISSION DATE:  02/15/2024, CONSULTATION DATE:  02/15/2024 REFERRING MD:  Dr. 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  Stroke code stroke CTA showed large intraparenchymal hemorrhage on the right side with some subdural subarachnoid hemorrhage as well.  Neurosurgery and neurology consulted for further management.  PCCM consulted for admission.  On ED arrival patient was seen mildly hypothermic, bradycardic, and hypertensive.  Lab work significant for potassium 3.3, glucose 216, hemoglobin 11 point.   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 Taken for crani and evacuation. Clipping of distal RMCA aneurysm  8/3 - Agitated overnight. BP remains at upper limit of goal.2L positive.Tmax 100.1. Tolerating SBT 8/6 repeat head CT with stable right temporal lobe hemorrhage with expected evolution of surrounding vasogenic edema and stable edematous changes and probable infarct in the posterior limb of the internal capsule 8/7 no acute issues overnight 8/9 head CT >> right temporal parenchymal  hemorrhage 4.1 cm stable in size.  No significant change vasogenic edema, encephalomalacia of the right temporal lobe.  Previously seen pneumocephalus has resolved 8/11: improved mentation follwing commands but still drowsy. Overnight fevers.  8/12: TEE no vegetation. Extubated. 8/13: reintubated. Bronch w Right sided mucus plugging.  Underwent trach 8/14 tolerated trach collar for few hours before switch back to full vent support due to increased work of breathing 8/15 tolerated trach collar for few hours, spiked fever 101, white count is trending down   Interim History / Subjective:  Patient tolerated trach collar for 2 hours before she was put back on full vent support due to increased work of breathing Spiked fever again this morning with Tmax 101 Tolerating spontaneous breathing trial White count continue to trend down  Objective    Blood pressure 121/67, pulse (!) 122, temperature (!) 101 F (38.3 C), temperature source Axillary, resp. rate (!) 22, height 5' 7 (1.702 m), weight 118.5 kg, SpO2 100%.    Vent Mode: PRVC FiO2 (%):  [30 %-40 %] 30 % Set Rate:  [20 bmp] 20 bmp Vt Set:  [500 mL] 500 mL PEEP:  [5 cmH20] 5 cmH20 Plateau Pressure:  [15 cmH20-19 cmH20] 18 cmH20   Intake/Output Summary (Last 24 hours) at 02/29/2024 0934 Last data filed at 02/29/2024 9095 Gross per 24 hour  Intake 2650 ml  Output 1850 ml  Net 800 ml   Filed Weights   02/27/24 0413 02/28/24 0500 02/29/24 0500  Weight: 118.9 kg 118.6 kg 118.5 kg    Examination:  General: Acutely ill-appearing middle-aged morbidly obese female, lying on the bed HEENT: [place]/AT, eyes anicteric.  moist mucus membranes.  Status post trach, cortak in place Neuro: Alert, awake following commands Chest:, Coarse breath sounds, no wheezes or rhonchi  Heart: Tachycardic, regular rhythm, no murmurs or gallops Abdomen: Soft, nontender, nondistended, bowel sounds present  Labs and images reviewed  Patient Lines/Drains/Airways  Status     Active Line/Drains/Airways     Name Placement date Placement time Site Days   Peripheral IV 02/17/24 20 G 1.88 Anterior;Left Forearm 02/17/24  1329  Forearm  12   Peripheral IV 02/26/24 22 G Posterior;Right Hand 02/26/24  0129  Hand  3   External Urinary Catheter 02/18/24  1427  --  11   Small Bore Feeding Tube 10 Fr. Right nare Marking at nare/corner of mouth 70 cm 02/17/24  1146  Right nare  12   Tracheostomy Shiley Flexible 6 mm Cuffed 02/26/24  1516  6 mm  3   Wound 02/15/24 1058 Surgical Closed Surgical Incision Head Right 02/15/24  1058  Head  14         Resolved Hospital problems  Constipation  Assessment and Plan  Acute right temporal intraparenchymal hemorrhage due to right MCA aneurysm status post craniectomy and evacuation of hematoma with aneurysm clipping Acute small right-sided subdural hemorrhage Cerebral edema with brain compression Brain herniation syndrome Induced hypernatremia Patient with a stable neuroexam Cerebral edema and brain compression, improving Continue seizure precautions Keep head of the bed elevated to 45 degrees TEE was negative for infective endocarditis, mycotic aneurysm was ruled out Serum sodium is trending down again now 153 Continue free water  flushes  Acute hypoxic and hypercapnic respiratory failure status post trach Sepsis due to pneumonia with Staph aureus 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 as of April 2025 Patient failed extubation trial, underwent tracheostomy on 8/13 Has been tolerating trach collar for few hours every day Will try trach collar again as long as patient can tolerate BAL cx is growing MSSA , Will start ancef  CellCept is on hold Awaiting LTAC placement  Anemia of critical illness H&H is stable now Watch for signs of bleeding Transfuse if less than 7  HTN, controlled Blood pressure is well-controlled Continue amlodipine , decrease losartan  to 50 mg daily Started on Coreg  12.5 mg  twice daily Maintain SBP 130-150  Acute kidney injury, prerenal Serum creatinine is stable now Avoid nephrotoxic agent  Morbid obesity Diet and exercise counseling as appropriate   Best Practice (right click and Reselect all SmartList Selections daily)   Diet/type: tubefeeds.  DVT prophylaxis.  Subcu heparin  Pressure ulcer(s): See nursing notes GI prophylaxis: H2B Lines: N/A Foley:  NA Code Status:  full code Last date of multidisciplinary goals of care discussion: 8/15: Patient's family was updated at bedside, decision was to continue full scope of care    Valinda Novas, MD [facility] See Amion for pager If no response to pager, please call [phone] until 7pm After 7pm, Please call E-link [phone]

## 2024-03-01 DIAGNOSIS — J9601 Acute respiratory failure with hypoxia: Secondary | ICD-10-CM | POA: Diagnosis not present

## 2024-03-01 DIAGNOSIS — I611 Nontraumatic intracerebral hemorrhage in hemisphere, cortical: Secondary | ICD-10-CM | POA: Diagnosis not present

## 2024-03-01 DIAGNOSIS — E871 Hypo-osmolality and hyponatremia: Secondary | ICD-10-CM | POA: Diagnosis not present

## 2024-03-01 DIAGNOSIS — G935 Compression of brain: Secondary | ICD-10-CM | POA: Diagnosis not present

## 2024-03-01 LAB — CBC
HCT: 28.9 % — ABNORMAL LOW (ref 36.0–46.0)
Hemoglobin: 8.2 g/dL — ABNORMAL LOW (ref 12.0–15.0)
MCH: 29.4 pg (ref 26.0–34.0)
MCHC: 28.4 g/dL — ABNORMAL LOW (ref 30.0–36.0)
MCV: 103.6 fL — ABNORMAL HIGH (ref 80.0–100.0)
Platelets: 527 K/uL — ABNORMAL HIGH (ref 150–400)
RBC: 2.79 MIL/uL — ABNORMAL LOW (ref 3.87–5.11)
RDW: 14.1 % (ref 11.5–15.5)
WBC: 14.4 K/uL — ABNORMAL HIGH (ref 4.0–10.5)
nRBC: 0 % (ref 0.0–0.2)

## 2024-03-01 LAB — COMPREHENSIVE METABOLIC PANEL WITH GFR
ALT: 86 U/L — ABNORMAL HIGH (ref 0–44)
AST: 58 U/L — ABNORMAL HIGH (ref 15–41)
Albumin: 2.3 g/dL — ABNORMAL LOW (ref 3.5–5.0)
Alkaline Phosphatase: 105 U/L (ref 38–126)
Anion gap: 11 (ref 5–15)
BUN: 52 mg/dL — ABNORMAL HIGH (ref 6–20)
CO2: 24 mmol/L (ref 22–32)
Calcium: 8.7 mg/dL — ABNORMAL LOW (ref 8.9–10.3)
Chloride: 119 mmol/L — ABNORMAL HIGH (ref 98–111)
Creatinine, Ser: 1.25 mg/dL — ABNORMAL HIGH (ref 0.44–1.00)
GFR, Estimated: 54 mL/min — ABNORMAL LOW (ref >60)
Glucose, Bld: 144 mg/dL — ABNORMAL HIGH (ref 70–99)
Potassium: 4.2 mmol/L (ref 3.5–5.1)
Sodium: 154 mmol/L — ABNORMAL HIGH (ref 135–145)
Total Bilirubin: 0.4 mg/dL (ref 0.0–1.2)
Total Protein: 7.5 g/dL (ref 6.5–8.1)

## 2024-03-01 MED ORDER — LACTATED RINGERS IV SOLN: INTRAVENOUS | Status: AC

## 2024-03-01 NOTE — Progress Notes (Signed)
 NAME:  Katie Knight, MRN:  [id number], DOB:  1976/07/02, LOS: 15 ADMISSION DATE:  02/15/2024, CONSULTATION DATE:  02/15/2024 REFERRING MD:  Dr. 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  Stroke code stroke CTA showed large intraparenchymal hemorrhage on the right side with some subdural subarachnoid hemorrhage as well.  Neurosurgery and neurology consulted for further management.  PCCM consulted for admission.  On ED arrival patient was seen mildly hypothermic, bradycardic, and hypertensive.  Lab work significant for potassium 3.3, glucose 216, hemoglobin 11 point.   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 Taken for crani and evacuation. Clipping of distal RMCA aneurysm  8/3 - Agitated overnight. BP remains at upper limit of goal.2L positive.Tmax 100.1. Tolerating SBT 8/6 repeat head CT with stable right temporal lobe hemorrhage with expected evolution of surrounding vasogenic edema and stable edematous changes and probable infarct in the posterior limb of the internal capsule 8/7 no acute issues overnight 8/9 head CT >> right temporal parenchymal  hemorrhage 4.1 cm stable in size.  No significant change vasogenic edema, encephalomalacia of the right temporal lobe.  Previously seen pneumocephalus has resolved 8/11: improved mentation follwing commands but still drowsy. Overnight fevers.  8/12: TEE no vegetation. Extubated. 8/13: reintubated. Bronch w Right sided mucus plugging.  Underwent trach 8/14 tolerated trach collar for few hours before switch back to full vent support due to increased work of breathing 8/15 tolerated trach collar for few hours, spiked fever 101, white count is trending down Continue to spike fever, respiratory culture growing MSSA, tolerating trach collar for few hours  Interim History / Subjective:  Patient continued to spike fever though spike is coming down, now Tmax is 100.8 She remained tachycardic but heart rate has improved from yesterday Respiratory culture is growing MSSA Currently on trach collar  Objective    Blood pressure (!) 120/59, pulse (!) 114, temperature (!) 100.8 F (38.2 C), temperature source Axillary, resp. rate (!) 24, height 5' 7 (1.702 m), weight 118.5 kg, SpO2 100%.    Vent Mode: PRVC FiO2 (%):  [30 %-40 %] 40 % Set Rate:  [20 bmp] 20 bmp Vt Set:  [500 mL] 500 mL PEEP:  [5 cmH20] 5 cmH20 Plateau Pressure:  [15 cmH20-18 cmH20] 15 cmH20   Intake/Output Summary (Last 24 hours) at 03/01/2024 0915 Last data filed at 03/01/2024 9092 Gross per 24 hour  Intake 3100.12 ml  Output 1600 ml  Net 1500.12 ml   Filed Weights   02/28/24 0500 02/29/24 0500 03/01/24 0350  Weight: 118.6 kg 118.5 kg 118.5 kg    Examination:  General: Middle-aged morbidly obese female, lying on the bed HEENT: [place]/AT, eyes anicteric.  moist mucus membranes.  Status post trach Neuro: Awake, following commands, antigravity on right side, withdrawing in left  lower extremity, 2/5 in left upper extremity Chest: Fine crackles at bases bilaterally, no wheezes or rhonchi Heart: Tachycardic, regular rhythm, no  murmurs or gallops Abdomen: Soft, nontender, nondistended, bowel sounds present   Labs and images reviewed  Patient Lines/Drains/Airways Status     Active Line/Drains/Airways     Name Placement date Placement time Site Days   Peripheral IV 02/17/24 20 G 1.88 Anterior;Left Forearm 02/17/24  1329  Forearm  12   Peripheral IV 02/26/24 22 G Posterior;Right Hand 02/26/24  0129  Hand  3   External Urinary Catheter 02/18/24  1427  --  11   Small Bore Feeding Tube 10 Fr. 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 and evacuation of hematoma with aneurysm clipping Acute small right-sided subdural hemorrhage Brain herniation syndrome Induced hypernatremia Patient is slowly improving Continue seizure precautions Keep head of the bed elevated to 45 degrees Serum sodium is is stable in 150s Continue free water  flushes every 3 hours 200 cc  Acute hypoxic and hypercapnic respiratory failure status post trach Sepsis due to pneumonia with MSSA 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 as of April 2025 Status post tracheostomy on 8/13 Has been tolerating trach collar for 1 to 2 hours every day before she goes back on full support mechanical ventilation Currently on trach collar Bronc cultures are growing MSSA Continue Ancef  Continue IV fluid at 75 cc/h Continue trach collar trial Patient needs LTAC placement CellCept is on hold  Anemia of critical illness H&H is stable now Watch for signs of bleeding  HTN,  controlled Blood pressure is well-controlled Continue amlodipine , losartan  and Coreg  Maintain SBP 130-150  Acute kidney injury, prerenal Serum creatinine trended up to left, likely due to dehydration and insensible loss in the setting of fever Started on gentle IV fluid for 6 hours Avoid nephrotoxic agent  Morbid obesity Diet and exercise counseling as appropriate   She has good prognosis, sooner as she gets to LTAC to come off of ventilator so she can aggressively work with PT/OT and speech therapy, better will be outcome   Best Practice (right click and Reselect all SmartList Selections daily)   Diet/type: tubefeeds.  DVT prophylaxis.  Subcu heparin  Pressure ulcer(s): See nursing notes GI prophylaxis: H2B Lines: N/A Foley:  NA Code Status:  full code Last date of multidisciplinary goals of care discussion: 8/15: Patient's family was updated at bedside, decision was to continue full scope of care    Valinda Novas, MD [facility] See Amion for pager If no response to pager, please call [phone] until 7pm After 7pm, Please call E-link [phone]

## 2024-03-02 DIAGNOSIS — J9601 Acute respiratory failure with hypoxia: Secondary | ICD-10-CM | POA: Diagnosis not present

## 2024-03-02 DIAGNOSIS — I611 Nontraumatic intracerebral hemorrhage in hemisphere, cortical: Secondary | ICD-10-CM | POA: Diagnosis not present

## 2024-03-02 DIAGNOSIS — E871 Hypo-osmolality and hyponatremia: Secondary | ICD-10-CM | POA: Diagnosis not present

## 2024-03-02 DIAGNOSIS — G935 Compression of brain: Secondary | ICD-10-CM | POA: Diagnosis not present

## 2024-03-02 LAB — CBC
HCT: 28.7 % — ABNORMAL LOW (ref 36.0–46.0)
Hemoglobin: 8.2 g/dL — ABNORMAL LOW (ref 12.0–15.0)
MCH: 29.3 pg (ref 26.0–34.0)
MCHC: 28.6 g/dL — ABNORMAL LOW (ref 30.0–36.0)
MCV: 102.5 fL — ABNORMAL HIGH (ref 80.0–100.0)
Platelets: 568 K/uL — ABNORMAL HIGH (ref 150–400)
RBC: 2.8 MIL/uL — ABNORMAL LOW (ref 3.87–5.11)
RDW: 13.9 % (ref 11.5–15.5)
WBC: 12.8 K/uL — ABNORMAL HIGH (ref 4.0–10.5)
nRBC: 0 % (ref 0.0–0.2)

## 2024-03-02 LAB — COMPREHENSIVE METABOLIC PANEL WITH GFR
ALT: 67 U/L — ABNORMAL HIGH (ref 0–44)
AST: 52 U/L — ABNORMAL HIGH (ref 15–41)
Albumin: 2.2 g/dL — ABNORMAL LOW (ref 3.5–5.0)
Alkaline Phosphatase: 104 U/L (ref 38–126)
Anion gap: 11 (ref 5–15)
BUN: 43 mg/dL — ABNORMAL HIGH (ref 6–20)
CO2: 25 mmol/L (ref 22–32)
Calcium: 9 mg/dL (ref 8.9–10.3)
Chloride: 117 mmol/L — ABNORMAL HIGH (ref 98–111)
Creatinine, Ser: 1.01 mg/dL — ABNORMAL HIGH (ref 0.44–1.00)
GFR, Estimated: >60 mL/min (ref >60)
Glucose, Bld: 156 mg/dL — ABNORMAL HIGH (ref 70–99)
Potassium: 4.4 mmol/L (ref 3.5–5.1)
Sodium: 153 mmol/L — ABNORMAL HIGH (ref 135–145)
Total Bilirubin: 0.4 mg/dL (ref 0.0–1.2)
Total Protein: 8.1 g/dL (ref 6.5–8.1)

## 2024-03-02 MED ORDER — NUTRISOURCE FIBER PO PACK
1.0000 | PACK | Freq: Three times a day (TID) | ORAL | Status: DC
  Administered 2024-03-02 – 2024-03-04 (×8): 1
  Filled 2024-03-02 (×8): qty 1

## 2024-03-02 MED ORDER — FREE WATER
200.0000 mL | Status: DC
  Administered 2024-03-02 – 2024-03-04 (×29): 200 mL

## 2024-03-02 MED ORDER — LABETALOL HCL 5 MG/ML IV SOLN: 10.0000 mg | INTRAVENOUS | Status: DC | PRN

## 2024-03-02 NOTE — Progress Notes (Signed)
 NAME:  Katie Knight, MRN:  [id number], DOB:  20-Jun-1976, LOS: 16 ADMISSION DATE:  02/15/2024, CONSULTATION DATE:  02/15/2024 REFERRING MD:  Dr. 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  Stroke code stroke CTA showed large intraparenchymal hemorrhage on the right side with some subdural subarachnoid hemorrhage as well.  Neurosurgery and neurology consulted for further management.  PCCM consulted for admission.  On ED arrival patient was seen mildly hypothermic, bradycardic, and hypertensive.  Lab work significant for potassium 3.3, glucose 216, hemoglobin 11 point.   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 Taken for crani and evacuation. Clipping of distal RMCA aneurysm  8/3 - Agitated overnight. BP remains at upper limit of goal.2L positive.Tmax 100.1. Tolerating SBT 8/6 repeat head CT with stable right temporal lobe hemorrhage with expected evolution of surrounding vasogenic edema and stable edematous changes and probable infarct in the posterior limb of the internal capsule 8/7 no acute issues overnight 8/9 head CT >> right temporal parenchymal  hemorrhage 4.1 cm stable in size.  No significant change vasogenic edema, encephalomalacia of the right temporal lobe.  Previously seen pneumocephalus has resolved 8/11: improved mentation follwing commands but still drowsy. Overnight fevers.  8/12: TEE no vegetation. Extubated. 8/13: reintubated. Bronch w Right sided mucus plugging.  Underwent trach 8/14 tolerated trach collar for few hours before switch back to full vent support due to increased work of breathing 8/15 tolerated trach collar for few hours, spiked fever 101, white count is trending down Continue to spike fever, respiratory culture growing MSSA, tolerating trach collar for few hours 8/17 fever spike is coming down remained tachycardic, probably tachycardia for few hours  Interim History / Subjective:  Patient tolerated trach collar for 12 hours yesterday Spiked fever with Tmax 100.5 She is having copious amount of respiratory secretions but she is able to cough it out  Objective    Blood pressure 116/80, pulse (!) 107, temperature 98.8 F (37.1 C), temperature source Axillary, resp. rate (!) 28, height 5' 7 (1.702 m), weight 118 kg, SpO2 100%.    Vent Mode: PSV;CPAP FiO2 (%):  [28 %-40 %] 28 % Set Rate:  [20 bmp] 20 bmp Vt Set:  [500 mL] 500 mL PEEP:  [5 cmH20] 5 cmH20 Pressure Support:  [10 cmH20] 10 cmH20 Plateau Pressure:  [14 cmH20-17 cmH20] 14 cmH20   Intake/Output Summary (Last 24 hours) at 03/02/2024 0827 Last data filed at 03/02/2024 0800 Gross per 24 hour  Intake 3380 ml  Output 1600 ml  Net 1780 ml   Filed Weights   02/29/24 0500 03/01/24 0350 03/02/24 0500  Weight: 118.5 kg 118.5 kg 118 kg    Examination:  General: Middle-aged morbidly obese female, lying on the bed HEENT: [place]/AT, eyes anicteric.  moist mucus membranes.  Status post trach on trach collar Neuro: Awake, following commands, antigravity on right side, 2/5 on left side, mouthing words Chest: Coarse breath sounds, no wheezes or  rhonchi Heart: Tachycardic, regular rhythm, no murmurs or gallops Abdomen: Soft, nontender, nondistended, bowel sounds present  Labs and images reviewed  Patient Lines/Drains/Airways Status     Active Line/Drains/Airways     Name Placement date Placement time Site Days   Peripheral IV 02/17/24 20 G 1.88 Anterior;Left Forearm 02/17/24  1329  Forearm  14   Peripheral IV 02/26/24 22 G Posterior;Right Hand 02/26/24  0129  Hand  5   External Urinary Catheter 02/18/24  1427  --  13   Fecal Management System 03/01/24  1200  -- 1   Small Bore Feeding Tube 10 Fr. 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 status post craniotomy and evacuation of hematoma with aneurysm clipping Acute small right-sided subdural hemorrhage Induced hypernatremia Patient is slowly improving, neuroexam is stable,/better Keep head of the bed elevated to 45 degrees Serum sodium is is stable in 150s Increase frequency of free water  flushes to every 2 hours 200 cc  Acute hypoxic and hypercapnic respiratory failure status post trach Sepsis due to pneumonia with MSSA 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 as of April 2025 Status post tracheostomy on 8/13 Tolerated trach collar for 12 hours yesterday, will continue trach collar as long as she can tolerate Continue IV cefazolin  for MSSA pneumonia Discontinue IV fluid Patient needs LTAC placement CellCept is on hold, will resume once pneumonia is better Try Passy-Muir valve  today  Anemia of critical illness H&H is stable now Transfuse if less than 7  HTN, controlled Blood pressure is well-controlled Continue amlodipine , losartan  and Coreg  Maintain SBP 130-150  Acute kidney injury, prerenal Serum creatinine trended down to 1 with IV fluid Avoid nephrotoxic agent  Morbid obesity Diet and exercise counseling as appropriate  She has good prognosis, sooner as she gets to LTAC to come off of ventilator so she can aggressively work with PT/OT and speech therapy, better will be outcome   Best Practice (right click and Reselect all SmartList Selections daily)   Diet/type: tubefeeds.  DVT prophylaxis.  Subcu heparin  Pressure ulcer(s): See nursing notes GI prophylaxis: H2B Lines: N/A Foley:  NA Code Status:  full code Last date of multidisciplinary goals of care discussion: 8/18: Patient's family was updated at bedside, decision was to continue full scope of care    Valinda Novas, MD [facility] See Amion for pager If no response to pager, please call [phone] until 7pm After 7pm, Please call E-link [phone]

## 2024-03-02 NOTE — Evaluation (Signed)
 Passy-Muir Speaking Valve - Evaluation Patient Details  Name: Katie Knight MRN: [id number] Date of Birth: 1976-05-09  Today's Date: 03/02/2024 Time: 8469-8451 SLP Time Calculation (min) (ACUTE ONLY): 18 min  Past Medical History:  Past Medical History:  Diagnosis Date   Arthritis    Asthma    High blood pressure    Hyperlipidemia    Joint pain    Osteoporosis    SOB (shortness of breath)    Vitamin D  deficiency    Past Surgical History:  Past Surgical History:  Procedure Laterality Date   BRONCHIAL BIOPSY  04/30/2022   Procedure: BRONCHIAL BIOPSIES;  Surgeon: Annella Donnice SAUNDERS, MD;  Location: [facility];  Service: Endoscopy;;   BRONCHIAL NEEDLE ASPIRATION BIOPSY  04/30/2022   Procedure: BRONCHIAL NEEDLE ASPIRATION BIOPSIES;  Surgeon: Annella Donnice SAUNDERS, MD;  Location: [facility];  Service: Endoscopy;;   BRONCHIAL WASHINGS  04/30/2022   Procedure: BRONCHIAL WASHINGS;  Surgeon: Annella Donnice SAUNDERS, MD;  Location: [facility];  Service: Endoscopy;;   CRANIOTOMY Right 02/15/2024   Procedure: CRANIOTOMY HEMATOMA EVACUATION SUBDURAL;  Surgeon: Lanis Pupa, MD;  Location: [facility] OR;  Service: Neurosurgery;  Laterality: Right;   DILATION AND CURETTAGE OF UTERUS     ENDOBRONCHIAL ULTRASOUND N/A 04/30/2022   Procedure: ENDOBRONCHIAL ULTRASOUND;  Surgeon: Annella Donnice SAUNDERS, MD;  Location: [facility];  Service: Endoscopy;  Laterality: N/A;   INTERCOSTAL NERVE BLOCK Right 07/26/2023   Procedure: INTERCOSTAL NERVE BLOCK;  Surgeon: Kerrin Elspeth BROCKS, MD;  Location: [facility] OR;  Service: Thoracic;  Laterality: Right;   LUNG BIOPSY Right 07/26/2023   Procedure: LUNG BIOPSY;  Surgeon: Kerrin Elspeth BROCKS, MD;  Location[facility];  Service: Thoracic;  Laterality: Right;   VIDEO BRONCHOSCOPY  04/30/2022   Procedure: VIDEO BRONCHOSCOPY WITHOUT FLUORO;  Surgeon: Annella Donnice SAUNDERS, MD;  Location: [facility];  Service: Endoscopy;;   HPI:  Katie Knight is a 48 year old  female who presented to the ED with complaints of headache with associated nausea and vomiting. CTA showed large R IPH with some subdural subarachnoid hemorrhage as well. Pt reintubated on 8/11, trach on 8/13. ATC on 8/17.  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 Pt demonstrated great effort, head positioning manipulated, deep oral suction and tracheal suction applied. No intervention yielded more than strained sound and minimal flow of air. Potential barrier could include excessive upper airway secretions, inability to pass air between tracheal wall and 6 Shiley with deflated cuff, or even some level of vocal fold paresis. Would consider FEES depending on plan for transition to next level of care. SLP Visit Diagnosis: Aphonia (R49.1)    Recommendations for use/ supervision  Patient may use Passy-Muir Speech Valve: with SLP only   SLP Assessment  Patient needs continued Speech Language Pathology Services   Assistance Recommended at Discharge Frequent or constant Supervision/Assistance  Functional Status Assessment Patient has had a recent decline in their functional status and demonstrates the ability to make significant improvements in function in a reasonable and predictable amount of time.  Frequency and Duration min 2x/week  2 weeks    PMSV Trial PMSV was placed for: 30 seconds Able to redirect subglottic air through upper airway: No Able to Attain Phonation: No Able to Expectorate Secretions: No Level of Secretion Expectoration with PMSV:  Tracheal Breath Support for Phonation: Other (comment) (couldnt achieve phonation but seemed to have good respiratory effort.) Intelligibility: Intelligibility reduced Word: 0-24% accurate Respirations During Trial:  30 SpO2 During Trial: 100 %   Tracheostomy Tube  Additional Tracheostomy Tube Assessment Trach Collar Period: all day today Secretion Description: whitish, runny Level of Secretion Expectoration: Tracheal    Vent Dependency  FiO2 (%): 28 %    Cuff Deflation Trial Tolerated Cuff Deflation: Yes Length of Time for Cuff Deflation Trial: 15 minutes Behavior: Alert         Brailey Buescher, Consuelo Fitch 03/02/2024, 4:03 PM

## 2024-03-02 NOTE — Progress Notes (Signed)
 Physical Therapy Treatment Patient Details Name: Katie Knight MRN: [id number] DOB: 07/03/1976 Today's Date: 03/02/2024   History of Present Illness Katie Knight is a 48 year old female who presented to the ED with complaints of headache with associated nausea and vomiting. CTA showed large R IPH with some subdural subarachnoid hemorrhage as well. Pt reintubated on 8/11, trach on 8/13.  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 Pt did open eyes and follow commands with R UE and LE however continues to not move L UE and LE to command. Pt tolerated chair position in bed well, VSS. Acute PT to cont to follow to progress towards EOB mobility.   If plan is discharge home, recommend the following: Two people to help with bathing/dressing/bathroom;Two people to help with walking and/or transfers;Help with stairs or ramp for entrance;Assist for transportation   Can travel by private vehicle        Equipment Recommendations   (TBD)    Recommendations for Other Services       Precautions / Restrictions Precautions Precautions: Fall Precaution/Restrictions Comments: intubated/vent Restrictions Weight Bearing Restrictions Per Provider Order: No     Mobility  Bed Mobility               General bed mobility comments: pt placed in chair position in bed    Transfers                   General transfer comment: unsafe at this time    Ambulation/Gait               General Gait Details: unable this date   Stairs             Wheelchair Mobility     Tilt Bed    Modified Rankin (Stroke Patients Only)       Balance Overall balance assessment: Needs assistance (pt dependent on bed to maintain upright posture via chair position of bed)                                           Communication Communication Communication: Impaired Factors Affecting Communication: Trach/intubated  Cognition Arousal: Lethargic (opened eyes to name but couldn't maintain) Behavior During Therapy: Flat affect   PT - Cognitive impairments: Difficult to assess Difficult to assess due to: Tracheostomy                     PT - Cognition Comments: pt lethargic, did open eyes to name, wife reports she received tylenol  and has been sleeping ever since Following commands: Impaired Following commands impaired: Follows one step commands with increased time (can follow with R UE and LE)    Cueing Cueing Techniques: Verbal cues, Tactile cues  Exercises General Exercises - Upper Extremity Shoulder Flexion: PROM, Both, 10 reps, Supine Elbow Flexion: PROM, Both, 10 reps, Supine (AA R elbow x 10 reps) Wrist Flexion: PROM, Both, 5 reps, Supine General Exercises - Lower Extremity Ankle Circles/Pumps: PROM, Both, 5 reps, Supine (chair position, PROM to L LE) Long Arc Quad: PROM, Both, 10 reps, Seated (chair position in bed) Heel Slides: PROM, Both, Supine    General Comments General comments (skin integrity, edema, etc.): VSS  Pertinent Vitals/Pain Pain Assessment Pain Assessment: Faces Faces Pain Scale: Hurts a little bit Pain Location: generalized with ROM to LEs    Home Living                          Prior Function            PT Goals (current goals can now be found in the care plan section) Acute Rehab PT Goals Patient Stated Goal: didn't state PT Goal Formulation: With patient/family Time For Goal Achievement: 03/11/24 Potential to Achieve Goals: Fair Progress towards PT goals: Progressing toward goals    Frequency    Min 2X/week      PT Plan      Co-evaluation              AM-PAC PT 6 Clicks Mobility   Outcome Measure  Help needed turning from your back to your side while in a flat bed without using bedrails?: Total Help  needed moving from lying on your back to sitting on the side of a flat bed without using bedrails?: Total Help needed moving to and from a bed to a chair (including a wheelchair)?: Total Help needed standing up from a chair using your arms (e.g., wheelchair or bedside chair)?: Total Help needed to walk in hospital room?: Total Help needed climbing 3-5 steps with a railing? : Total 6 Click Score: 6    End of Session Equipment Utilized During Treatment:  (trach collar) Activity Tolerance: Patient limited by fatigue Patient left: in bed;with call bell/phone within reach;with family/visitor present;with bed alarm set (wife present) Nurse Communication: Mobility status;Need for lift equipment PT Visit Diagnosis: Unsteadiness on feet (R26.81);Muscle weakness (generalized) (M62.81);Difficulty in walking, not elsewhere classified (R26.2)     Time: 8746-8693 PT Time Calculation (min) (ACUTE ONLY): 13 min  Charges:    $Therapeutic Exercise: 8-22 mins PT General Charges $$ ACUTE PT VISIT: 1 Visit                     Norene Ames, PT, DPT Acute Rehabilitation Services Secure chat preferred Office #: [phone]    Norene CHRISTELLA Ames 03/02/2024, 2:13 PM

## 2024-03-03 ENCOUNTER — Ambulatory Visit: Admitting: Physician Assistant

## 2024-03-03 ENCOUNTER — Inpatient Hospital Stay (HOSPITAL_COMMUNITY)

## 2024-03-03 DIAGNOSIS — J9601 Acute respiratory failure with hypoxia: Secondary | ICD-10-CM | POA: Diagnosis not present

## 2024-03-03 DIAGNOSIS — J9602 Acute respiratory failure with hypercapnia: Secondary | ICD-10-CM | POA: Diagnosis not present

## 2024-03-03 DIAGNOSIS — K581 Irritable bowel syndrome with constipation: Secondary | ICD-10-CM

## 2024-03-03 DIAGNOSIS — I611 Nontraumatic intracerebral hemorrhage in hemisphere, cortical: Secondary | ICD-10-CM | POA: Diagnosis not present

## 2024-03-03 DIAGNOSIS — M170 Bilateral primary osteoarthritis of knee: Secondary | ICD-10-CM

## 2024-03-03 DIAGNOSIS — R053 Chronic cough: Secondary | ICD-10-CM

## 2024-03-03 DIAGNOSIS — R5383 Other fatigue: Secondary | ICD-10-CM

## 2024-03-03 DIAGNOSIS — A419 Sepsis, unspecified organism: Secondary | ICD-10-CM | POA: Diagnosis not present

## 2024-03-03 DIAGNOSIS — J4551 Severe persistent asthma with (acute) exacerbation: Secondary | ICD-10-CM

## 2024-03-03 DIAGNOSIS — R9389 Abnormal findings on diagnostic imaging of other specified body structures: Secondary | ICD-10-CM

## 2024-03-03 DIAGNOSIS — R590 Localized enlarged lymph nodes: Secondary | ICD-10-CM

## 2024-03-03 DIAGNOSIS — E559 Vitamin D deficiency, unspecified: Secondary | ICD-10-CM

## 2024-03-03 DIAGNOSIS — R768 Other specified abnormal immunological findings in serum: Secondary | ICD-10-CM

## 2024-03-03 LAB — CBC
HCT: 30.8 % — ABNORMAL LOW (ref 36.0–46.0)
Hemoglobin: 8.8 g/dL — ABNORMAL LOW (ref 12.0–15.0)
MCH: 28.8 pg (ref 26.0–34.0)
MCHC: 28.6 g/dL — ABNORMAL LOW (ref 30.0–36.0)
MCV: 100.7 fL — ABNORMAL HIGH (ref 80.0–100.0)
Platelets: 652 K/uL — ABNORMAL HIGH (ref 150–400)
RBC: 3.06 MIL/uL — ABNORMAL LOW (ref 3.87–5.11)
RDW: 13.9 % (ref 11.5–15.5)
WBC: 14.8 K/uL — ABNORMAL HIGH (ref 4.0–10.5)
nRBC: 0 % (ref 0.0–0.2)

## 2024-03-03 LAB — COMPREHENSIVE METABOLIC PANEL WITH GFR
ALT: 63 U/L — ABNORMAL HIGH (ref 0–44)
AST: 60 U/L — ABNORMAL HIGH (ref 15–41)
Albumin: 2.3 g/dL — ABNORMAL LOW (ref 3.5–5.0)
Alkaline Phosphatase: 127 U/L — ABNORMAL HIGH (ref 38–126)
Anion gap: 10 (ref 5–15)
BUN: 36 mg/dL — ABNORMAL HIGH (ref 6–20)
CO2: 23 mmol/L (ref 22–32)
Calcium: 9.1 mg/dL (ref 8.9–10.3)
Chloride: 113 mmol/L — ABNORMAL HIGH (ref 98–111)
Creatinine, Ser: 0.99 mg/dL (ref 0.44–1.00)
GFR, Estimated: >60 mL/min (ref >60)
Glucose, Bld: 136 mg/dL — ABNORMAL HIGH (ref 70–99)
Potassium: 5.2 mmol/L — ABNORMAL HIGH (ref 3.5–5.1)
Sodium: 146 mmol/L — ABNORMAL HIGH (ref 135–145)
Total Bilirubin: 0.6 mg/dL (ref 0.0–1.2)
Total Protein: 8.5 g/dL — ABNORMAL HIGH (ref 6.5–8.1)

## 2024-03-03 LAB — MAGNESIUM: Magnesium: 2.6 mg/dL — ABNORMAL HIGH (ref 1.7–2.4)

## 2024-03-03 LAB — PHOSPHORUS: Phosphorus: 3.9 mg/dL (ref 2.5–4.6)

## 2024-03-03 MED ORDER — SODIUM CHLORIDE 0.9 % IV SOLN
3.0000 g | Freq: Four times a day (QID) | INTRAVENOUS | Status: DC
  Administered 2024-03-03 – 2024-03-04 (×6): 3 g via INTRAVENOUS
  Filled 2024-03-03 (×6): qty 8

## 2024-03-03 NOTE — Progress Notes (Signed)
 NAME:  Katie Knight, MRN:  [id number], DOB:  Oct 17, 1975, LOS: 17 ADMISSION DATE:  02/15/2024, CONSULTATION DATE:  02/15/2024 REFERRING MD:  Dr. 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  Stroke code stroke CTA showed large intraparenchymal hemorrhage on the right side with some subdural subarachnoid hemorrhage as well.  Neurosurgery and neurology consulted for further management.  PCCM consulted for admission.  On ED arrival patient was seen mildly hypothermic, bradycardic, and hypertensive.  Lab work significant for potassium 3.3, glucose 216, hemoglobin 11 point.   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 Taken for crani and evacuation. Clipping of distal RMCA aneurysm  8/3 - Agitated overnight. BP remains at upper limit of goal.2L positive.Tmax 100.1. Tolerating SBT 8/6 repeat head CT with stable right temporal lobe hemorrhage with expected evolution of surrounding vasogenic edema and stable edematous changes and probable infarct in the posterior limb of the internal capsule 8/7 no acute issues overnight 8/9 head CT >> right temporal parenchymal  hemorrhage 4.1 cm stable in size.  No significant change vasogenic edema, encephalomalacia of the right temporal lobe.  Previously seen pneumocephalus has resolved 8/11: improved mentation follwing commands but still drowsy. Overnight fevers.  8/12: TEE no vegetation. Extubated. 8/13: reintubated. Bronch w Right sided mucus plugging.  Underwent trach 8/14 tolerated trach collar for few hours before switch back to full vent support due to increased work of breathing 8/15 tolerated trach collar for few hours, spiked fever 101, white count is trending down Continue to spike fever, respiratory culture growing MSSA, tolerating trach collar for few hours 8/17 fever spike is coming down remained tachycardic, probably tachycardia for few hours 8/18 spiked fever 100.5, tolerated trach collar for 12 hours, continued to have copious oral respiratory secretions but she is able to cough without  Interim History / Subjective:  Patient spiked fever this morning with Tmax 103.1 She is tachycardic to 150s Continued to have copious amount of respiratory secretions She tolerated trach collar for 16 hours before she was put back on ventilator due to tachypnea, increased work of breathing and tachycardia This morning she did not tolerate a spontaneous breathing trial due to tachypnea and increased work of breathing  Objective    Blood pressure 131/79, pulse (!) 149, temperature (!) 103.1 F (39.5 C), temperature source Axillary, resp. rate (!) 30, height 5' 7 (1.702 m), weight 118.5 kg, SpO2 96%.    Vent Mode: PRVC FiO2 (%):  [28 %-30 %] 30 % Set Rate:  [20 bmp] 20 bmp Vt Set:  [500 mL] 500 mL PEEP:  [5 cmH20] 5 cmH20 Plateau Pressure:  [19 cmH20] 19 cmH20   Intake/Output Summary (Last 24 hours) at 03/03/2024 0800 Last data filed at 03/03/2024 0738 Gross per 24 hour  Intake 3859.86 ml  Output 2787 ml  Net 1072.86 ml   Filed Weights   03/01/24 0350 03/02/24 0500 03/03/24 0426  Weight: 118.5 kg 118 kg  118.5 kg    Examination:  General: Acutely ill-appearing middle-aged morbidly obese female, lying on the bed, status post trach HEENT: [place]/AT, eyes anicteric.  moist mucus membranes Neuro: Lethargic, opens eyes to vocal stimuli, following simple commands, antigravity on right side, withdrawing on left side Chest: Tachypneic, coarse breath sounds, no wheezes or rhonchi Heart: Cardiac, regular rhythm, no murmurs or gallops Abdomen: Soft, nontender, nondistended, bowel sounds present  Labs and images reviewed  Patient Lines/Drains/Airways Status     Active Line/Drains/Airways     Name Placement date Placement time Site Days   Peripheral IV 02/17/24 20 G 1.88 Anterior;Left Forearm 02/17/24  1329  Forearm  15   Peripheral IV 02/26/24 22 G Posterior;Right Hand 02/26/24  0129  Hand  6   External Urinary Catheter 02/18/24  1427  --  14   Fecal Management System 03/01/24  1200  -- 2   Small Bore Feeding Tube 10 Fr. 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 and evacuation of hematoma with aneurysm clipping Acute small right-sided subdural hemorrhage Induced hypernatremia Neuroexam is stable Keep head of the bed elevated to 45 degrees Serum sodium was in 150s yesterday Repeat labs are pending Continue free water  flushes to 200 cc every 2 hours  Acute hypoxic and hypercapnic respiratory failure status post trach Sepsis due to recurrent pneumonia 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  as of April 2025 Status post tracheostomy on 8/13 Became septic now with fever of 103.1, tachycardic to 150s and tachypneic Tolerated trach collar for 16 hours yesterday but failed respond to breathing trial this morning Switch antibiotic to IV Unasyn  as she has been on cefazolin  for 72 hours, still spiking fevers Will repeat respiratory culture Awaiting LTAC placement CellCept is on hold, will resume once pneumonia is better  Anemia of critical illness H&H has been stable Repeat labs are pending today  HTN, controlled Blood pressure is at goal Continue amlodipine , losartan  and Coreg  Maintain SBP 130-150  Acute kidney injury, prerenal Serum creatinine trended down to 1 with IV fluid Avoid nephrotoxic agent  Morbid obesity Diet and exercise counseling as appropriate  She has good prognosis, sooner as she gets to LTAC to wean off of ventilator so she can aggressively work with PT/OT and speech therapy, better will be outcome   Best Practice (right click and Reselect all SmartList Selections daily)   Diet/type: tubefeeds.  DVT prophylaxis.  Subcu heparin  Pressure ulcer(s): See nursing notes GI prophylaxis: H2B Lines: N/A Foley:  NA Code Status:  full code Last date of multidisciplinary goals of care discussion: 8/18: Patient's family was updated at bedside, decision was to continue full scope of care    The patient is critically ill due to sepsis due to recurrent pneumonia/acute respiratory failure with hypoxia.  Critical care was necessary  to treat or prevent imminent or life-threatening deterioration.  Critical care was time spent personally by me on the following activities: development of treatment plan with patient and/or surrogate as well as nursing, discussions with consultants, evaluation of patient's response to treatment, examination of patient, obtaining history from patient or surrogate, ordering and performing treatments and interventions, ordering and review of  laboratory studies, ordering and review of radiographic studies, pulse oximetry, re-evaluation of patient's condition and participation in multidisciplinary rounds.   During this encounter critical care time was devoted to patient care services described in this note for 38 minutes.     Valinda Novas, MD [facility] See Amion for pager If no response to pager, please call [phone] until 7pm After 7pm, Please call E-link [phone]

## 2024-03-04 ENCOUNTER — Institutional Professional Consult (permissible substitution) (HOSPITAL_COMMUNITY)

## 2024-03-04 ENCOUNTER — Institutional Professional Consult (permissible substitution)
Admission: EM | Admit: 2024-03-04 | Discharge: 2024-03-21 | Disposition: A | Discharge status: Rehabilitation Facility | Attending: Internal Medicine | Admitting: Internal Medicine

## 2024-03-04 DIAGNOSIS — I611 Nontraumatic intracerebral hemorrhage in hemisphere, cortical: Secondary | ICD-10-CM | POA: Diagnosis not present

## 2024-03-04 DIAGNOSIS — J9602 Acute respiratory failure with hypercapnia: Secondary | ICD-10-CM | POA: Diagnosis not present

## 2024-03-04 DIAGNOSIS — A419 Sepsis, unspecified organism: Secondary | ICD-10-CM | POA: Diagnosis not present

## 2024-03-04 DIAGNOSIS — J9601 Acute respiratory failure with hypoxia: Secondary | ICD-10-CM | POA: Diagnosis not present

## 2024-03-04 MED ORDER — BISACODYL 10 MG RE SUPP: 10.0000 mg | Freq: Every day | RECTAL | 0 refills | Status: DC | PRN

## 2024-03-04 MED ORDER — HEPARIN SODIUM (PORCINE) 5000 UNIT/ML IJ SOLN: 5000.0000 [IU] | Freq: Three times a day (TID) | INTRAMUSCULAR | 0 refills | Status: DC

## 2024-03-04 MED ORDER — FAMOTIDINE 20 MG PO TABS: 20.0000 mg | ORAL_TABLET | Freq: Two times a day (BID) | ORAL | 0 refills | Status: DC

## 2024-03-04 MED ORDER — CARVEDILOL 12.5 MG PO TABS: 12.5000 mg | ORAL_TABLET | Freq: Two times a day (BID) | ORAL | 0 refills | Status: AC

## 2024-03-04 MED ORDER — ALBUTEROL SULFATE (2.5 MG/3ML) 0.083% IN NEBU: 2.5000 mg | INHALATION_SOLUTION | Freq: Four times a day (QID) | RESPIRATORY_TRACT | 12 refills | Status: DC | PRN

## 2024-03-04 MED ORDER — FREE WATER: 200.0000 mL | 0 refills | Status: DC

## 2024-03-04 MED ORDER — CARVEDILOL 12.5 MG PO TABS
12.5000 mg | ORAL_TABLET | Freq: Two times a day (BID) | ORAL | Status: DC
  Administered 2024-03-04: 12.5 mg
  Filled 2024-03-04: qty 1

## 2024-03-04 MED ORDER — OSMOLITE 1.5 CAL PO LIQD: 1000.0000 mL | ORAL | 0 refills | Status: DC

## 2024-03-04 MED ORDER — SODIUM CHLORIDE 0.9 % IV SOLN: 3.0000 g | Freq: Four times a day (QID) | INTRAVENOUS | 0 refills | Status: DC

## 2024-03-04 MED ORDER — DOCUSATE SODIUM 50 MG/5ML PO LIQD: 100.0000 mg | Freq: Two times a day (BID) | ORAL | 0 refills | Status: DC | PRN

## 2024-03-04 MED ORDER — AMLODIPINE BESYLATE 10 MG PO TABS: 10.0000 mg | ORAL_TABLET | Freq: Every day | ORAL | 0 refills | Status: DC

## 2024-03-04 MED ORDER — PROSOURCE TF20 ENFIT COMPATIBL EN LIQD: 60.0000 mL | Freq: Two times a day (BID) | ENTERAL | 0 refills | Status: DC

## 2024-03-04 MED ORDER — NUTRISOURCE FIBER PO PACK: 1.0000 | PACK | Freq: Three times a day (TID) | ORAL | 0 refills | Status: DC

## 2024-03-04 MED ORDER — LOSARTAN POTASSIUM 50 MG PO TABS
25.0000 mg | ORAL_TABLET | Freq: Every day | ORAL | Status: DC
  Administered 2024-03-04: 25 mg
  Filled 2024-03-04: qty 1

## 2024-03-04 MED ORDER — ADULT MULTIVITAMIN W/MINERALS CH: 1.0000 | ORAL_TABLET | Freq: Every day | ORAL | 0 refills | Status: DC

## 2024-03-04 MED ORDER — LOSARTAN POTASSIUM 25 MG PO TABS: 25.0000 mg | ORAL_TABLET | Freq: Every day | ORAL | 0 refills | Status: DC

## 2024-03-04 NOTE — TOC CM/SW Note (Signed)
 03-04-2024   RENEE @ [employer]  : CONTACT FOR D/C PLANNING NEEDS AND UPDATE . PHONE # [phone]

## 2024-03-04 NOTE — TOC Transition Note (Signed)
 Transition of Care [facility]) - Discharge Note   Patient Details  Name: Katie Knight MRN: [id number] Date of Birth: 1976-03-25  Transition of Care [facility]) CM/SW Contact:  Simon Aaberg M, RN Phone Number: 03/04/2024, 2:33 PM   Clinical Narrative:    Insurance authorization has been received for admission to [facility], and bed is available today.  Room number is 5E18, accepting MD is Dr. Fleeta Finger. MD to MD report can be called to [phone]. RN to RN report can be called to the same number.    Final next level of care: Long Term Acute Care (LTAC) Barriers to Discharge: Barriers Resolved   Patient Goals and CMS Choice   CMS Medicare.gov Compare Post Acute Care list provided to:: Patient Represenative (must comment) (wife) Choice offered to / list presented to : Spouse      Discharge Placement  [facility]                     Discharge Plan and Services Additional resources added to the After Visit Summary for     Discharge Planning Services: CM Consult                                 Social Drivers of Health (SDOH) Interventions SDOH Screenings   Food Insecurity: No Food Insecurity (02/21/2024)  Housing: Unknown (02/21/2024)  Transportation Needs: No Transportation Needs (02/21/2024)  Utilities: Not At Risk (02/21/2024)  Tobacco Use: Low Risk  (02/15/2024)     Readmission Risk Interventions     No data to display         Mliss MICAEL Fass, RN, BSN  Trauma/Neuro ICU Case Manager [phone]

## 2024-03-04 NOTE — Progress Notes (Signed)
 Physical Therapy Treatment Patient Details Name: Katie Knight MRN: [id number] DOB: 22-Nov-1975 Today's Date: 03/04/2024   History of Present Illness Katie Knight is a 48 year old female who presented to the ED with complaints of headache with associated nausea and vomiting. CTA showed large R IPH with some subdural subarachnoid hemorrhage as well. Pt reintubated on 8/11, trach on 8/13.  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 Focused on static sitting balance and L sided awareness. Pt engaged with therapist and wife while EOB, attempting to vocalize. Pt remains to require maxAX2 for mobility with L sided weakness, delayed processing, and difficulty initiating tasks. Acute PT to cont to follow.    If plan is discharge home, recommend the following: Two people to help with bathing/dressing/bathroom;Two people to help with walking and/or transfers;Help with stairs or ramp for entrance;Assist for transportation   Can travel by private vehicle        Equipment Recommendations   (TBD)    Recommendations for Other Services       Precautions / Restrictions Precautions Precautions: Fall Precaution/Restrictions Comments: trach collar Restrictions Weight Bearing Restrictions Per Provider Order: No     Mobility  Bed Mobility Overal bed mobility: Needs Assistance Bed Mobility: Supine to Sit, Sit to Supine     Supine to sit: Max assist, +2 for physical assistance, +2 for safety/equipment Sit to supine: Max assist, +2 for physical assistance   General bed mobility comments: HOB elevated, RN and PT transferred pt to EOB via use of bed pad and helicopter technique, pt did attempt to move R LE towards EOB with multimodal cues and pull up with R UE, totalA for LE management back into bed, maxAx2 to scoot up towards [facility]     Transfers                   General transfer comment: unsafe at this time    Ambulation/Gait               General Gait Details: unable this date   Stairs             Wheelchair Mobility     Tilt Bed    Modified Rankin (Stroke Patients Only)       Balance Overall balance assessment: Needs assistance Sitting-balance support: Feet supported, Single extremity supported Sitting balance-Leahy Scale: Poor Sitting balance - Comments: pt requiring minA to maxA to maintain EOB balance, requiring increased assist with onset of fatigue. Pt tolerated EOB x 10 min. Attempted to brush teeth, difficulty holding brush with R hand requiring assist from PT and RN, Postural control: Posterior lean, Right lateral lean                                  Communication Communication Communication: Impaired Factors Affecting Communication: Difficulty expressing self;Reduced clarity of speech  Cognition Arousal: Lethargic (became more alert and maintained eyes opened once transferred to EOB) Behavior During Therapy: Flat affect   PT - Cognitive impairments: Difficult to assess Difficult to assess due to: Tracheostomy                     PT - Cognition Comments: pt attempting to verbalize but unable to determine what patient was  saying, pt with command follow with R UE and LE majority of time but not with L, R gaze preference Following commands: Impaired Following commands impaired: Follows one step commands with increased time (can follow with R UE and LE)    Cueing Cueing Techniques: Verbal cues, Tactile cues  Exercises Other Exercises Other Exercises: worked on L elbow WBing in sitting EOB to promote awareness to L side    General Comments General comments (skin integrity, edema, etc.): VSS      Pertinent Vitals/Pain Pain Assessment Pain Assessment: Faces Faces Pain Scale: Hurts a little bit Pain Location: generalized with ROM to LEs     Home Living                          Prior Function            PT Goals (current goals can now be found in the care plan section) Acute Rehab PT Goals Patient Stated Goal: didn't state PT Goal Formulation: With patient/family Time For Goal Achievement: 03/11/24 Potential to Achieve Goals: Fair Progress towards PT goals: Progressing toward goals    Frequency    Min 2X/week      PT Plan      Co-evaluation              AM-PAC PT 6 Clicks Mobility   Outcome Measure  Help needed turning from your back to your side while in a flat bed without using bedrails?: Total Help needed moving from lying on your back to sitting on the side of a flat bed without using bedrails?: Total Help needed moving to and from a bed to a chair (including a wheelchair)?: Total Help needed standing up from a chair using your arms (e.g., wheelchair or bedside chair)?: Total Help needed to walk in hospital room?: Total Help needed climbing 3-5 steps with a railing? : Total 6 Click Score: 6    End of Session Equipment Utilized During Treatment:  (trach collar) Activity Tolerance: Patient tolerated treatment well Patient left: in bed;with call bell/phone within reach;with family/visitor present;with nursing/sitter in room (wife present) Nurse Communication: Mobility status;Need for lift equipment PT Visit Diagnosis: Unsteadiness on feet (R26.81);Muscle weakness (generalized) (M62.81);Difficulty in walking, not elsewhere classified (R26.2)     Time: 8752-8682 PT Time Calculation (min) (ACUTE ONLY): 30 min  Charges:    $Therapeutic Activity: 8-22 mins $Neuromuscular Re-education: 8-22 mins PT General Charges $$ ACUTE PT VISIT: 1 Visit                     Norene Ames, PT, DPT Acute Rehabilitation Services Secure chat preferred Office #: [phone]    Norene CHRISTELLA Ames 03/04/2024, 2:04 PM

## 2024-03-04 NOTE — Progress Notes (Signed)
 NAME:  Katie Knight, MRN:  [id number], DOB:  12/10/1975, LOS: 18 ADMISSION DATE:  02/15/2024, CONSULTATION DATE:  02/15/2024 REFERRING MD:  Dr. 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  Stroke code stroke CTA showed large intraparenchymal hemorrhage on the right side with some subdural subarachnoid hemorrhage as well.  Neurosurgery and neurology consulted for further management.  PCCM consulted for admission.  On ED arrival patient was seen mildly hypothermic, bradycardic, and hypertensive.  Lab work significant for potassium 3.3, glucose 216, hemoglobin 11 point.   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 Taken for crani and evacuation. Clipping of distal RMCA aneurysm  8/3 - Agitated overnight. BP remains at upper limit of goal.2L positive.Tmax 100.1. Tolerating SBT 8/6 repeat head CT with stable right temporal lobe hemorrhage with expected evolution of surrounding vasogenic edema and stable edematous changes and probable infarct in the posterior limb of the internal capsule 8/7 no acute issues overnight 8/9 head CT >> right temporal parenchymal  hemorrhage 4.1 cm stable in size.  No significant change vasogenic edema, encephalomalacia of the right temporal lobe.  Previously seen pneumocephalus has resolved 8/11: improved mentation follwing commands but still drowsy. Overnight fevers.  8/12: TEE no vegetation. Extubated. 8/13: reintubated. Bronch w Right sided mucus plugging.  Underwent trach 8/14 tolerated trach collar for few hours before switch back to full vent support due to increased work of breathing 8/15 tolerated trach collar for few hours, spiked fever 101, white count is trending down Continue to spike fever, respiratory culture growing MSSA, tolerating trach collar for few hours 8/17 fever spike is coming down remained tachycardic, probably tachycardia for few hours 8/18 spiked fever 100.5, tolerated trach collar for 12 hours, continued to have copious oral respiratory secretions but she is able to cough without 8/19 patient started spiking fever again with Tmax 103.1, tachycardic to 150s with copious amount of respiratory secretions.  Failed spontaneous breathing trial due to tachypnea and increased work of breathing  Interim History / Subjective:  Patient continued to spike fever with Tmax 102 She is still tachycardic but heart rate in 120s  Tolerating spontaneous breathing trial again today  Objective    Blood pressure 134/69, pulse (!) 129, temperature 98.5 F (36.9 C), temperature source Axillary, resp. rate (!) 27, height 5' 7 (1.702 m), weight 115.9 kg, SpO2 99%.    Vent Mode: PRVC FiO2 (%):  [30 %] 30 % Set Rate:  [20 bmp] 20 bmp Vt Set:  [500 mL] 500 mL PEEP:  [5 cmH20] 5 cmH20 Plateau Pressure:  [12 cmH20] 12 cmH20   Intake/Output Summary (Last 24 hours) at 03/04/2024 0851 Last data filed at 03/04/2024 0802 Gross per 24 hour  Intake 3800 ml  Output 2512 ml  Net 1288 ml  Filed Weights   03/02/24 0500 03/03/24 0426 03/04/24 0410  Weight: 118 kg 118.5 kg 115.9 kg    Examination:  General: Acutely  ill-appearing female, lying on the bed HEENT: [place]/AT, eyes anicteric.  moist mucus membranes.  S/p trach, erythema noted around trach site, cortak in place Neuro: Lethargic, opens eyes with vocal stimuli, following simple commands, strong cough and gag, antigravity on right side, withdrawing in left side Chest: Bilateral coarse breath sounds, no wheezes or rhonchi Heart: Tachycardic, regular rhythm, no murmurs or gallops Abdomen: Soft, nontender, nondistended, bowel sounds present  Labs and images reviewed  Patient Lines/Drains/Airways Status     Active Line/Drains/Airways     Name Placement date Placement time Site Days   Peripheral IV 02/26/24 22 G Posterior;Right Hand 02/26/24  0129  Hand  7   Peripheral IV 03/03/24 22 G 1.75 Anterior;Right Forearm 03/03/24  2346  Forearm  1   External Urinary Catheter 02/18/24  1427  --  15   Fecal Management System 03/01/24  1200  -- 3   Small Bore Feeding Tube 10 Fr. 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 status post craniotomy and evacuation of hematoma with aneurysm clipping Acute small right-sided subdural hemorrhage Induced hypernatremia Neuroexam remained stable or slightly better Keep head of the bed elevated to 45 degrees Serum sodium trended down to 146 Will repeat labs in the morning Continue free water  flushes to 200 cc every 2 hours Avoid antiplatelet and anticoagulation  Acute hypoxic and hypercapnic respiratory failure status post trach Sepsis due to  recurrent pneumonia 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 as of April 2025 Status post tracheostomy on 8/13 Patient continued to spike fever, heart rate remained in 120s better than yesterday Repeat respiratory culture is growing gram-positive cocci Continue IV Unasyn  Awaiting LTAC placement Still CellCept is on hold  Anemia of critical illness H&H has been stable No signs of active bleeding  HTN, controlled Blood pressure is at goal Continue amlodipine  Crease losartan  25 mg once daily and coreg  12.5 bid Maintain SBP 130-150  Acute kidney injury, prerenal Serum creatinine continue to improve Avoid nephrotoxic agent  Morbid obesity Diet and exercise counseling as appropriate  She has good prognosis, sooner as she gets to LTAC to wean off of ventilator so she can aggressively work with PT/OT and speech therapy, better will be outcome   Best Practice (right click and Reselect all SmartList Selections daily)   Diet/type: tubefeeds.  DVT prophylaxis.  Subcu heparin  Pressure ulcer(s): See nursing notes GI prophylaxis: H2B Lines: N/A Foley:  NA Code Status:  full code Last date of multidisciplinary goals of care discussion: 8/18: Patient's family was updated at bedside, decision was to continue full scope of care    The patient is critically ill due to sepsis due to recurrent pneumonia/acute respiratory failure with hypoxia.  Critical care was necessary to treat or prevent imminent or life-threatening deterioration.  Critical care was time spent personally by me  on the following activities: development of treatment plan with patient and/or surrogate as well as nursing, discussions with consultants, evaluation of patient's response to treatment, examination of patient, obtaining history from patient or surrogate, ordering and performing treatments and interventions, ordering and review of laboratory studies, ordering and review of radiographic studies, pulse oximetry,  re-evaluation of patient's condition and participation in multidisciplinary rounds.   During this encounter critical care time was devoted to patient care services described in this note for 38 minutes.     Valinda Novas, MD [facility] See Amion for pager If no response to pager, please call [phone] until 7pm After 7pm, Please call E-link [phone]

## 2024-03-04 NOTE — Progress Notes (Signed)
 RT removed pt from full ventilatory support and placed pt on ATC 6L 28%. Pt tolerating well at this time with SVS. RN notified. Ventilator on stby at bedside.

## 2024-03-04 NOTE — Discharge Summary (Signed)
 Physician Discharge Summary      Patient ID: Katie Knight MRN: [id number] DOB/AGE: 15-Nov-1975 48 y.o.  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  Stroke code stroke CTA showed large intraparenchymal hemorrhage on the right side with some subdural subarachnoid hemorrhage as well.  Neurosurgery and neurology consulted for further management.  PCCM consulted for admission.   On ED arrival patient was seen mildly hypothermic, bradycardic, and hypertensive.  Lab work significant for potassium 3.3, glucose 216, hemoglobin 11 point.   Pertinent Hospital events since admission 8/2 presented with complaints of headache, nausea, vomiting, and altered mental status of large intraparenchymal hemorrhage. Taken for crani and evacuation. Clipping of distal RMCA aneurysm  8/3 - Agitated overnight. BP remains at upper limit of goal.2L positive.Tmax 100.1. Tolerating SBT 8/6 repeat head CT with stable right temporal lobe hemorrhage with expected evolution of surrounding vasogenic edema and  stable edematous changes and probable infarct in the posterior limb of the internal capsule 8/7 no acute issues overnight 8/9 head CT >> right temporal parenchymal hemorrhage 4.1 cm stable in size.  No significant change vasogenic edema, encephalomalacia of the right temporal lobe.  Previously seen pneumocephalus has resolved 8/11: improved mentation follwing commands but still drowsy. Overnight fevers.  8/12: TEE no vegetation. Extubated. 8/13: reintubated. Bronch w Right sided mucus plugging.  Underwent trach 8/14 tolerated trach collar for few hours before switch back to full vent support due to increased work of breathing 8/15 tolerated trach collar for few hours, spiked fever 101, white count is trending down Continue to spike fever, respiratory culture growing MSSA, tolerating trach collar for few hours 8/17 fever spike is coming down remained tachycardic, probably tachycardia for few hours 8/18 spiked fever 100.5, tolerated trach collar for 12 hours, continued to have copious oral respiratory secretions but she is able to cough  8/19 patient started spiking fever again with Tmax 103.1, tachycardic to 150s with copious amount of respiratory secretions.  Failed spontaneous breathing trial due to tachypnea and increased work of breathing empiric Unasyn  started 8/20 Patient continued to spike fever with Tmax 102, She is still tachycardic but heart rate in 120s. Tolerating spontaneous breathing trial again today.  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 status post craniotomy and evacuation of hematoma with aneurysm clipping Acute small right-sided subdural hemorrhage Induced hypernatremia -Neuroexam remained stable or slightly better P: Keep continue free water  flushes 200 cc every 2 hours upon discharge Head of bed elevated 45 degrees Avoid antiplatelets and anticoagulants moving  forward Ongoing attempts at rehabilitation   Acute hypoxic and hypercapnic respiratory failure status post trach -Tracheostomy placed 8/13  Sepsis due to recurrent pneumonia -Empiric Unasyn  started 8/19 for Tmax 103.1 with tachycardia and copious tracheal secretions, -sputum culture with rare Staphylococcus aureus 8/13 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 as of April 2025 P: Continue empiric Unasyn  x 7 days upon discharge, stop date 8/22 Continue as needed vent support with goal of ATC trials during the day Aspiration precautions PAD protocol Home with CellCept remains on hold  Anemia of critical illness P: Continue to trend CBC upon discharge No active signs of bleeding  HTN, controlled -Blood pressure is at goal P: Continue amlodipine  10 mg a daily, carvedilol  12.5 3 times daily, and Cozaar  25 mg daily upon discharge SBP goal 120-150   Morbid obesity P: Diet and exercise counseling when appropriate  At risk malnutrition P: Continue tube feeds via smallbore NG tube   Significant Hospital tests/ studies  See pertinent Hospital events above  Procedures   8/13 percutaneous tracheostomy  Culture data/antimicrobials   Sputum culture pansensitive rare Staphylococcus aureus 8/13   Consults  Neurology    Discharge Exam: BP 110/82   Pulse (!) 124   Temp 98.5 F (36.9 C) (Axillary)   Resp (!) 24   Ht 5' 7 (1.702 m)   Wt 115.9 kg   SpO2 100%   BMI 40.02 kg/m   Physical exam General: Acutely ill-appearing female, lying on the bed HEENT: Katie Knight, eyes anicteric.  moist mucus membranes.  S/p trach, erythema noted around trach site, cortak in place Neuro: Lethargic, opens eyes with vocal stimuli, following simple commands, strong cough and gag, antigravity on right side, withdrawing in left side Chest: Bilateral coarse breath sounds, no wheezes or rhonchi Heart: Tachycardic, regular rhythm, no murmurs or gallops Abdomen: Soft, nontender, nondistended,  bowel sounds present  Labs at discharge   Lab Results  Component Value Date   CREATININE 0.99 03/03/2024   BUN 36 (H) 03/03/2024   NA 146 (H) 03/03/2024   K 5.2 (H) 03/03/2024   CL 113 (H) 03/03/2024   CO2 23 03/03/2024   Lab Results  Component Value Date   WBC 14.8 (H) 03/03/2024   HGB 8.8 (L) 03/03/2024   HCT 30.8 (L) 03/03/2024   MCV 100.7 (H) 03/03/2024   PLT 652 (H) 03/03/2024   Lab Results  Component Value Date   ALT 63 (H) 03/03/2024   AST 60 (H) 03/03/2024   ALKPHOS 127 (H) 03/03/2024   BILITOT 0.6 03/03/2024   Lab Results  Component Value Date   INR 1.1 02/24/2024   INR 1.0 02/15/2024   INR 1.1 07/24/2023    Current radiological studies    DG CHEST PORT 1 VIEW Result Date: 03/03/2024 CLINICAL DATA:  Respiratory failure. EXAM: PORTABLE CHEST 1 VIEW COMPARISON:  02/29/2024 FINDINGS: Rotated film. Tracheostomy tube again noted. A feeding tube passes into the stomach although the distal tip position is not included on the film. The cardio pericardial silhouette is enlarged. Retrocardiac left base collapse/consolidation is associated with streaky opacity in the right base suggesting atelectasis. Telemetry leads overlie the chest. IMPRESSION: Retrocardiac left base collapse/consolidation is progressive in the interval with similar streaky opacity in the right base suggesting atelectasis. Electronically Signed   By: Camellia Candle M.D.   On: 03/03/2024 10:43    Disposition:  LTACH     Discharge Instructions     Ambulatory referral to Neurology   Complete by: As directed    Follow up with stroke clinic NP at [facility] in about 8 weeks. Thanks.  Allergies as of 03/04/2024   No Known Allergies      Medication List     STOP taking these medications    budesonide -formoterol  160-4.5 MCG/ACT inhaler Commonly known as: SYMBICORT    Linzess  145 MCG Caps capsule Generic drug: linaclotide    mycophenolate 500 MG tablet Commonly known as: CELLCEPT    phentermine 37.5 MG tablet Commonly known as: ADIPEX-P   ProAir  HFA 108 (90 Base) MCG/ACT inhaler Generic drug: albuterol  Replaced by: albuterol  (2.5 MG/3ML) 0.083% nebulizer solution       TAKE these medications    acetaminophen  500 MG tablet Commonly known as: TYLENOL  Take 500-1,000 mg by mouth 2 (two) times daily as needed for moderate pain (pain score 4-6) or headache.   albuterol  (2.5 MG/3ML) 0.083% nebulizer solution Commonly known as: PROVENTIL  Take 3 mLs (2.5 mg total) by nebulization every 6 (six) hours as needed for wheezing or shortness of breath. Replaces: ProAir  HFA 108 (90 Base) MCG/ACT inhaler   amLODipine  10 MG tablet Commonly known as: NORVASC  Place 1 tablet (10 mg total) into feeding tube daily. Start taking on: March 05, 2024   Ampicillin -Sulbactam 3 g in sodium chloride  0.9 % 100 mL Inject 3 g into the vein every 6 (six) hours.   bisacodyl  10 MG suppository Commonly known as: DULCOLAX Place 1 suppository (10 mg total) rectally daily as needed for severe constipation.   carvedilol  12.5 MG tablet Commonly known as: COREG  Place 1 tablet (12.5 mg total) into feeding tube 2 (two) times daily with a meal.   docusate 50 MG/5ML liquid Commonly known as: COLACE Place 10 mLs (100 mg total) into feeding tube 2 (two) times daily as needed for mild constipation.   famotidine  20 MG tablet Commonly known as: PEPCID  Place 1 tablet (20 mg total) into feeding tube 2 (two) times daily.   feeding supplement (OSMOLITE 1.5 CAL) Liqd Place 1,000 mLs into feeding tube continuous.   feeding supplement (PROSource TF20) liquid Place 60 mLs into feeding tube 2 (two) times daily.   fiber Pack packet Place 1 packet into feeding tube 3 (three) times daily.   free water  Soln Place 200 mLs into feeding tube every 2 (two) hours.   heparin  5000 UNIT/ML injection Inject 1 mL (5,000 Units total) into the skin every 8 (eight) hours.   losartan  25 MG tablet Commonly known  as: COZAAR  Place 1 tablet (25 mg total) into feeding tube daily. Start taking on: March 05, 2024   multivitamin with minerals Tabs tablet Place 1 tablet into feeding tube daily. Start taking on: March 05, 2024         Follow-up appointment   Pending  Discharge Condition:    fair   Signed: Sukhraj Esquivias D. Harris 03/04/2024, 2:24 PM

## 2024-03-05 DIAGNOSIS — I69298 Other sequelae of other nontraumatic intracranial hemorrhage: Secondary | ICD-10-CM

## 2024-03-05 DIAGNOSIS — Z930 Tracheostomy status: Secondary | ICD-10-CM

## 2024-03-05 DIAGNOSIS — J849 Interstitial pulmonary disease, unspecified: Secondary | ICD-10-CM

## 2024-03-05 DIAGNOSIS — J159 Unspecified bacterial pneumonia: Secondary | ICD-10-CM

## 2024-03-05 DIAGNOSIS — R69 Illness, unspecified: Secondary | ICD-10-CM

## 2024-03-05 DIAGNOSIS — J9621 Acute and chronic respiratory failure with hypoxia: Secondary | ICD-10-CM

## 2024-03-05 DIAGNOSIS — Y95 Nosocomial condition: Secondary | ICD-10-CM

## 2024-03-05 LAB — COMPREHENSIVE METABOLIC PANEL WITH GFR
ALT: 59 U/L — ABNORMAL HIGH (ref 0–44)
AST: 54 U/L — ABNORMAL HIGH (ref 15–41)
Albumin: 1.9 g/dL — ABNORMAL LOW (ref 3.5–5.0)
Alkaline Phosphatase: 100 U/L (ref 38–126)
Anion gap: 9 (ref 5–15)
BUN: 26 mg/dL — ABNORMAL HIGH (ref 6–20)
CO2: 25 mmol/L (ref 22–32)
Calcium: 8.8 mg/dL — ABNORMAL LOW (ref 8.9–10.3)
Chloride: 106 mmol/L (ref 98–111)
Creatinine, Ser: 0.82 mg/dL (ref 0.44–1.00)
GFR, Estimated: >60 mL/min (ref >60)
Glucose, Bld: 137 mg/dL — ABNORMAL HIGH (ref 70–99)
Potassium: 4.6 mmol/L (ref 3.5–5.1)
Sodium: 140 mmol/L (ref 135–145)
Total Bilirubin: 0.2 mg/dL (ref 0.0–1.2)
Total Protein: 7.5 g/dL (ref 6.5–8.1)

## 2024-03-05 LAB — CBC WITH DIFFERENTIAL/PLATELET
Abs Immature Granulocytes: 0.19 K/uL — ABNORMAL HIGH (ref 0.00–0.07)
Basophils Absolute: 0 K/uL (ref 0.0–0.1)
Basophils Relative: 0 %
Eosinophils Absolute: 0.3 K/uL (ref 0.0–0.5)
Eosinophils Relative: 2 %
HCT: 25.1 % — ABNORMAL LOW (ref 36.0–46.0)
Hemoglobin: 7.5 g/dL — ABNORMAL LOW (ref 12.0–15.0)
Immature Granulocytes: 2 %
Lymphocytes Relative: 12 %
Lymphs Abs: 1.4 K/uL (ref 0.7–4.0)
MCH: 29.3 pg (ref 26.0–34.0)
MCHC: 29.9 g/dL — ABNORMAL LOW (ref 30.0–36.0)
MCV: 98 fL (ref 80.0–100.0)
Monocytes Absolute: 0.6 K/uL (ref 0.1–1.0)
Monocytes Relative: 5 %
Neutro Abs: 9.3 K/uL — ABNORMAL HIGH (ref 1.7–7.7)
Neutrophils Relative %: 79 %
Platelets: 465 K/uL — ABNORMAL HIGH (ref 150–400)
RBC: 2.56 MIL/uL — ABNORMAL LOW (ref 3.87–5.11)
RDW: 13.4 % (ref 11.5–15.5)
WBC: 11.8 K/uL — ABNORMAL HIGH (ref 4.0–10.5)
nRBC: 0 % (ref 0.0–0.2)

## 2024-03-05 LAB — BLOOD GAS, ARTERIAL
Acid-Base Excess: 2.8 mmol/L — ABNORMAL HIGH (ref 0.0–2.0)
Bicarbonate: 27.2 mmol/L (ref 20.0–28.0)
O2 Saturation: 96.4 %
Patient temperature: 36.6
pCO2 arterial: 39 mmHg (ref 32–48)
pH, Arterial: 7.45 (ref 7.35–7.45)
pO2, Arterial: 112 mmHg — ABNORMAL HIGH (ref 83–108)

## 2024-03-05 LAB — PHOSPHORUS: Phosphorus: 3.9 mg/dL (ref 2.5–4.6)

## 2024-03-05 LAB — C DIFFICILE QUICK SCREEN W PCR REFLEX
C Diff antigen: NEGATIVE
C Diff interpretation: NOT DETECTED
C Diff toxin: NEGATIVE

## 2024-03-05 LAB — MAGNESIUM: Magnesium: 2 mg/dL (ref 1.7–2.4)

## 2024-03-06 DIAGNOSIS — J9621 Acute and chronic respiratory failure with hypoxia: Secondary | ICD-10-CM

## 2024-03-06 DIAGNOSIS — J159 Unspecified bacterial pneumonia: Secondary | ICD-10-CM

## 2024-03-06 DIAGNOSIS — Z930 Tracheostomy status: Secondary | ICD-10-CM

## 2024-03-06 DIAGNOSIS — J849 Interstitial pulmonary disease, unspecified: Secondary | ICD-10-CM

## 2024-03-06 DIAGNOSIS — R69 Illness, unspecified: Secondary | ICD-10-CM

## 2024-03-06 DIAGNOSIS — I69298 Other sequelae of other nontraumatic intracranial hemorrhage: Secondary | ICD-10-CM

## 2024-03-06 DIAGNOSIS — Y95 Nosocomial condition: Secondary | ICD-10-CM

## 2024-03-06 NOTE — Progress Notes (Signed)
 Patient's trach cx came back positive for Staph aures/MSSA, Ecoli and Klebsiella, resistant to unasyn . [facility] was called and I informed on call attending. Advised to switched antibiotics to Zosyn   Valinda Novas, MD

## 2024-03-07 DIAGNOSIS — J159 Unspecified bacterial pneumonia: Secondary | ICD-10-CM

## 2024-03-07 DIAGNOSIS — J9621 Acute and chronic respiratory failure with hypoxia: Secondary | ICD-10-CM

## 2024-03-07 DIAGNOSIS — J849 Interstitial pulmonary disease, unspecified: Secondary | ICD-10-CM

## 2024-03-07 DIAGNOSIS — Y95 Nosocomial condition: Secondary | ICD-10-CM

## 2024-03-07 DIAGNOSIS — I69298 Other sequelae of other nontraumatic intracranial hemorrhage: Secondary | ICD-10-CM

## 2024-03-07 DIAGNOSIS — Z930 Tracheostomy status: Secondary | ICD-10-CM

## 2024-03-07 DIAGNOSIS — R69 Illness, unspecified: Secondary | ICD-10-CM

## 2024-03-07 LAB — CULTURE, RESPIRATORY W GRAM STAIN

## 2024-03-08 DIAGNOSIS — Z930 Tracheostomy status: Secondary | ICD-10-CM

## 2024-03-08 DIAGNOSIS — Y95 Nosocomial condition: Secondary | ICD-10-CM

## 2024-03-08 DIAGNOSIS — J9621 Acute and chronic respiratory failure with hypoxia: Secondary | ICD-10-CM

## 2024-03-08 DIAGNOSIS — J849 Interstitial pulmonary disease, unspecified: Secondary | ICD-10-CM

## 2024-03-08 DIAGNOSIS — J159 Unspecified bacterial pneumonia: Secondary | ICD-10-CM

## 2024-03-08 DIAGNOSIS — I69298 Other sequelae of other nontraumatic intracranial hemorrhage: Secondary | ICD-10-CM

## 2024-03-08 DIAGNOSIS — R69 Illness, unspecified: Secondary | ICD-10-CM

## 2024-03-09 DIAGNOSIS — J159 Unspecified bacterial pneumonia: Secondary | ICD-10-CM

## 2024-03-09 DIAGNOSIS — Z930 Tracheostomy status: Secondary | ICD-10-CM

## 2024-03-09 DIAGNOSIS — Y95 Nosocomial condition: Secondary | ICD-10-CM

## 2024-03-09 DIAGNOSIS — R69 Illness, unspecified: Secondary | ICD-10-CM

## 2024-03-09 DIAGNOSIS — I69298 Other sequelae of other nontraumatic intracranial hemorrhage: Secondary | ICD-10-CM

## 2024-03-09 DIAGNOSIS — J9621 Acute and chronic respiratory failure with hypoxia: Secondary | ICD-10-CM

## 2024-03-09 DIAGNOSIS — J849 Interstitial pulmonary disease, unspecified: Secondary | ICD-10-CM

## 2024-03-09 LAB — BASIC METABOLIC PANEL WITH GFR
Anion gap: 11 (ref 5–15)
BUN: 24 mg/dL — ABNORMAL HIGH (ref 6–20)
CO2: 25 mmol/L (ref 22–32)
Calcium: 8.9 mg/dL (ref 8.9–10.3)
Chloride: 96 mmol/L — ABNORMAL LOW (ref 98–111)
Creatinine, Ser: 0.83 mg/dL (ref 0.44–1.00)
GFR, Estimated: >60 mL/min (ref >60)
Glucose, Bld: 139 mg/dL — ABNORMAL HIGH (ref 70–99)
Potassium: 4.8 mmol/L (ref 3.5–5.1)
Sodium: 132 mmol/L — ABNORMAL LOW (ref 135–145)

## 2024-03-09 LAB — CBC
HCT: 25.7 % — ABNORMAL LOW (ref 36.0–46.0)
Hemoglobin: 7.8 g/dL — ABNORMAL LOW (ref 12.0–15.0)
MCH: 29.4 pg (ref 26.0–34.0)
MCHC: 30.4 g/dL (ref 30.0–36.0)
MCV: 97 fL (ref 80.0–100.0)
Platelets: 554 K/uL — ABNORMAL HIGH (ref 150–400)
RBC: 2.65 MIL/uL — ABNORMAL LOW (ref 3.87–5.11)
RDW: 13.5 % (ref 11.5–15.5)
WBC: 10.6 K/uL — ABNORMAL HIGH (ref 4.0–10.5)
nRBC: 0 % (ref 0.0–0.2)

## 2024-03-09 LAB — MAGNESIUM: Magnesium: 2 mg/dL (ref 1.7–2.4)

## 2024-03-10 DIAGNOSIS — I69298 Other sequelae of other nontraumatic intracranial hemorrhage: Secondary | ICD-10-CM

## 2024-03-10 DIAGNOSIS — Z930 Tracheostomy status: Secondary | ICD-10-CM

## 2024-03-10 DIAGNOSIS — J9621 Acute and chronic respiratory failure with hypoxia: Secondary | ICD-10-CM

## 2024-03-10 DIAGNOSIS — R69 Illness, unspecified: Secondary | ICD-10-CM

## 2024-03-10 DIAGNOSIS — J849 Interstitial pulmonary disease, unspecified: Secondary | ICD-10-CM

## 2024-03-10 DIAGNOSIS — Y95 Nosocomial condition: Secondary | ICD-10-CM

## 2024-03-10 DIAGNOSIS — J159 Unspecified bacterial pneumonia: Secondary | ICD-10-CM

## 2024-03-11 DIAGNOSIS — J159 Unspecified bacterial pneumonia: Secondary | ICD-10-CM

## 2024-03-11 DIAGNOSIS — Z930 Tracheostomy status: Secondary | ICD-10-CM

## 2024-03-11 DIAGNOSIS — Y95 Nosocomial condition: Secondary | ICD-10-CM

## 2024-03-11 DIAGNOSIS — I69298 Other sequelae of other nontraumatic intracranial hemorrhage: Secondary | ICD-10-CM

## 2024-03-11 DIAGNOSIS — J849 Interstitial pulmonary disease, unspecified: Secondary | ICD-10-CM

## 2024-03-11 DIAGNOSIS — R69 Illness, unspecified: Secondary | ICD-10-CM

## 2024-03-11 DIAGNOSIS — J9621 Acute and chronic respiratory failure with hypoxia: Secondary | ICD-10-CM

## 2024-03-12 DIAGNOSIS — J159 Unspecified bacterial pneumonia: Secondary | ICD-10-CM

## 2024-03-12 DIAGNOSIS — J9621 Acute and chronic respiratory failure with hypoxia: Secondary | ICD-10-CM

## 2024-03-12 DIAGNOSIS — Z930 Tracheostomy status: Secondary | ICD-10-CM

## 2024-03-12 DIAGNOSIS — R69 Illness, unspecified: Secondary | ICD-10-CM

## 2024-03-12 DIAGNOSIS — I69298 Other sequelae of other nontraumatic intracranial hemorrhage: Secondary | ICD-10-CM

## 2024-03-12 DIAGNOSIS — Y95 Nosocomial condition: Secondary | ICD-10-CM

## 2024-03-12 DIAGNOSIS — J849 Interstitial pulmonary disease, unspecified: Secondary | ICD-10-CM

## 2024-03-12 LAB — CBC
HCT: 23.9 % — ABNORMAL LOW (ref 36.0–46.0)
Hemoglobin: 7.4 g/dL — ABNORMAL LOW (ref 12.0–15.0)
MCH: 29.2 pg (ref 26.0–34.0)
MCHC: 31 g/dL (ref 30.0–36.0)
MCV: 94.5 fL (ref 80.0–100.0)
Platelets: 443 K/uL — ABNORMAL HIGH (ref 150–400)
RBC: 2.53 MIL/uL — ABNORMAL LOW (ref 3.87–5.11)
RDW: 13.4 % (ref 11.5–15.5)
WBC: 8 K/uL (ref 4.0–10.5)
nRBC: 0 % (ref 0.0–0.2)

## 2024-03-12 LAB — BASIC METABOLIC PANEL WITH GFR
Anion gap: 11 (ref 5–15)
BUN: 14 mg/dL (ref 6–20)
CO2: 25 mmol/L (ref 22–32)
Calcium: 8.8 mg/dL — ABNORMAL LOW (ref 8.9–10.3)
Chloride: 99 mmol/L (ref 98–111)
Creatinine, Ser: 0.81 mg/dL (ref 0.44–1.00)
GFR, Estimated: >60 mL/min (ref >60)
Glucose, Bld: 124 mg/dL — ABNORMAL HIGH (ref 70–99)
Potassium: 4.3 mmol/L (ref 3.5–5.1)
Sodium: 135 mmol/L (ref 135–145)

## 2024-03-13 DIAGNOSIS — Y95 Nosocomial condition: Secondary | ICD-10-CM

## 2024-03-13 DIAGNOSIS — J159 Unspecified bacterial pneumonia: Secondary | ICD-10-CM

## 2024-03-13 DIAGNOSIS — R69 Illness, unspecified: Secondary | ICD-10-CM

## 2024-03-13 DIAGNOSIS — J9621 Acute and chronic respiratory failure with hypoxia: Secondary | ICD-10-CM

## 2024-03-13 DIAGNOSIS — I69298 Other sequelae of other nontraumatic intracranial hemorrhage: Secondary | ICD-10-CM

## 2024-03-13 DIAGNOSIS — Z930 Tracheostomy status: Secondary | ICD-10-CM

## 2024-03-13 DIAGNOSIS — J849 Interstitial pulmonary disease, unspecified: Secondary | ICD-10-CM

## 2024-03-13 LAB — HEMOGLOBIN AND HEMATOCRIT, BLOOD
HCT: 28.5 % — ABNORMAL LOW (ref 36.0–46.0)
Hemoglobin: 8.7 g/dL — ABNORMAL LOW (ref 12.0–15.0)

## 2024-03-15 ENCOUNTER — Institutional Professional Consult (permissible substitution) (HOSPITAL_COMMUNITY)

## 2024-03-15 LAB — BLOOD GAS, ARTERIAL
Acid-Base Excess: 3.9 mmol/L — ABNORMAL HIGH (ref 0.0–2.0)
Bicarbonate: 28.5 mmol/L — ABNORMAL HIGH (ref 20.0–28.0)
O2 Saturation: 99.4 %
Patient temperature: 37
pCO2 arterial: 42 mmHg (ref 32–48)
pH, Arterial: 7.44 (ref 7.35–7.45)
pO2, Arterial: 111 mmHg — ABNORMAL HIGH (ref 83–108)

## 2024-03-16 DIAGNOSIS — J159 Unspecified bacterial pneumonia: Secondary | ICD-10-CM

## 2024-03-16 DIAGNOSIS — Z930 Tracheostomy status: Secondary | ICD-10-CM

## 2024-03-16 DIAGNOSIS — J9621 Acute and chronic respiratory failure with hypoxia: Secondary | ICD-10-CM

## 2024-03-16 DIAGNOSIS — R69 Illness, unspecified: Secondary | ICD-10-CM

## 2024-03-16 DIAGNOSIS — Y95 Nosocomial condition: Secondary | ICD-10-CM

## 2024-03-16 DIAGNOSIS — I69298 Other sequelae of other nontraumatic intracranial hemorrhage: Secondary | ICD-10-CM

## 2024-03-16 DIAGNOSIS — J849 Interstitial pulmonary disease, unspecified: Secondary | ICD-10-CM

## 2024-03-16 LAB — CBC
HCT: 28 % — ABNORMAL LOW (ref 36.0–46.0)
Hemoglobin: 8.6 g/dL — ABNORMAL LOW (ref 12.0–15.0)
MCH: 28.9 pg (ref 26.0–34.0)
MCHC: 30.7 g/dL (ref 30.0–36.0)
MCV: 94 fL (ref 80.0–100.0)
Platelets: 452 K/uL — ABNORMAL HIGH (ref 150–400)
RBC: 2.98 MIL/uL — ABNORMAL LOW (ref 3.87–5.11)
RDW: 13.8 % (ref 11.5–15.5)
WBC: 7 K/uL (ref 4.0–10.5)
nRBC: 0 % (ref 0.0–0.2)

## 2024-03-16 LAB — BASIC METABOLIC PANEL WITH GFR
Anion gap: 11 (ref 5–15)
BUN: 16 mg/dL (ref 6–20)
CO2: 27 mmol/L (ref 22–32)
Calcium: 9.2 mg/dL (ref 8.9–10.3)
Chloride: 96 mmol/L — ABNORMAL LOW (ref 98–111)
Creatinine, Ser: 0.92 mg/dL (ref 0.44–1.00)
GFR, Estimated: >60 mL/min (ref >60)
Glucose, Bld: 117 mg/dL — ABNORMAL HIGH (ref 70–99)
Potassium: 4.3 mmol/L (ref 3.5–5.1)
Sodium: 134 mmol/L — ABNORMAL LOW (ref 135–145)

## 2024-03-16 LAB — MAGNESIUM: Magnesium: 1.8 mg/dL (ref 1.7–2.4)

## 2024-03-17 DIAGNOSIS — J849 Interstitial pulmonary disease, unspecified: Secondary | ICD-10-CM

## 2024-03-17 DIAGNOSIS — R69 Illness, unspecified: Secondary | ICD-10-CM

## 2024-03-17 DIAGNOSIS — J159 Unspecified bacterial pneumonia: Secondary | ICD-10-CM

## 2024-03-17 DIAGNOSIS — Z930 Tracheostomy status: Secondary | ICD-10-CM

## 2024-03-17 DIAGNOSIS — Y95 Nosocomial condition: Secondary | ICD-10-CM

## 2024-03-17 DIAGNOSIS — I69298 Other sequelae of other nontraumatic intracranial hemorrhage: Secondary | ICD-10-CM

## 2024-03-17 DIAGNOSIS — J9621 Acute and chronic respiratory failure with hypoxia: Secondary | ICD-10-CM

## 2024-03-17 LAB — BASIC METABOLIC PANEL WITH GFR
Anion gap: 10 (ref 5–15)
BUN: 14 mg/dL (ref 6–20)
CO2: 27 mmol/L (ref 22–32)
Calcium: 9.3 mg/dL (ref 8.9–10.3)
Chloride: 95 mmol/L — ABNORMAL LOW (ref 98–111)
Creatinine, Ser: 0.94 mg/dL (ref 0.44–1.00)
GFR, Estimated: >60 mL/min (ref >60)
Glucose, Bld: 101 mg/dL — ABNORMAL HIGH (ref 70–99)
Potassium: 4.7 mmol/L (ref 3.5–5.1)
Sodium: 132 mmol/L — ABNORMAL LOW (ref 135–145)

## 2024-03-18 DIAGNOSIS — I69298 Other sequelae of other nontraumatic intracranial hemorrhage: Secondary | ICD-10-CM

## 2024-03-18 DIAGNOSIS — J9621 Acute and chronic respiratory failure with hypoxia: Secondary | ICD-10-CM

## 2024-03-18 DIAGNOSIS — J849 Interstitial pulmonary disease, unspecified: Secondary | ICD-10-CM

## 2024-03-18 DIAGNOSIS — R69 Illness, unspecified: Secondary | ICD-10-CM

## 2024-03-18 DIAGNOSIS — J159 Unspecified bacterial pneumonia: Secondary | ICD-10-CM

## 2024-03-18 DIAGNOSIS — Y95 Nosocomial condition: Secondary | ICD-10-CM

## 2024-03-18 DIAGNOSIS — Z930 Tracheostomy status: Secondary | ICD-10-CM

## 2024-03-19 ENCOUNTER — Institutional Professional Consult (permissible substitution) (HOSPITAL_COMMUNITY)

## 2024-03-19 DIAGNOSIS — J159 Unspecified bacterial pneumonia: Secondary | ICD-10-CM

## 2024-03-19 DIAGNOSIS — R69 Illness, unspecified: Secondary | ICD-10-CM

## 2024-03-19 DIAGNOSIS — J9621 Acute and chronic respiratory failure with hypoxia: Secondary | ICD-10-CM

## 2024-03-19 DIAGNOSIS — Z930 Tracheostomy status: Secondary | ICD-10-CM

## 2024-03-19 DIAGNOSIS — J849 Interstitial pulmonary disease, unspecified: Secondary | ICD-10-CM

## 2024-03-19 DIAGNOSIS — Y95 Nosocomial condition: Secondary | ICD-10-CM

## 2024-03-19 DIAGNOSIS — I69298 Other sequelae of other nontraumatic intracranial hemorrhage: Secondary | ICD-10-CM

## 2024-03-19 LAB — BASIC METABOLIC PANEL WITH GFR
Anion gap: 11 (ref 5–15)
BUN: 15 mg/dL (ref 6–20)
CO2: 28 mmol/L (ref 22–32)
Calcium: 9.2 mg/dL (ref 8.9–10.3)
Chloride: 98 mmol/L (ref 98–111)
Creatinine, Ser: 0.86 mg/dL (ref 0.44–1.00)
GFR, Estimated: >60 mL/min (ref >60)
Glucose, Bld: 113 mg/dL — ABNORMAL HIGH (ref 70–99)
Potassium: 4.6 mmol/L (ref 3.5–5.1)
Sodium: 137 mmol/L (ref 135–145)

## 2024-03-20 DIAGNOSIS — J849 Interstitial pulmonary disease, unspecified: Secondary | ICD-10-CM

## 2024-03-20 DIAGNOSIS — J159 Unspecified bacterial pneumonia: Secondary | ICD-10-CM

## 2024-03-20 DIAGNOSIS — R69 Illness, unspecified: Secondary | ICD-10-CM

## 2024-03-20 DIAGNOSIS — Z930 Tracheostomy status: Secondary | ICD-10-CM

## 2024-03-20 DIAGNOSIS — J9621 Acute and chronic respiratory failure with hypoxia: Secondary | ICD-10-CM

## 2024-03-20 DIAGNOSIS — Y95 Nosocomial condition: Secondary | ICD-10-CM

## 2024-03-20 DIAGNOSIS — I69298 Other sequelae of other nontraumatic intracranial hemorrhage: Secondary | ICD-10-CM

## 2024-03-21 NOTE — Discharge Summary (Signed)
 DISCHARGE SUMMARY   BRIEF OVERVIEW   Admitting Provider: Corean JONETTA Daring, MD Discharge Provider: Corean JONETTA Daring, MD Primary Care Physician at Discharge: Glendia LITTIE Freeman [phone]   Admission Date: 03/04/2024     Discharge Date: No discharge date for patient encounter.  Primary Discharge Diagnosis Acute Hypoxic Respiratory Failure. CVA   DISCHARGE DISPOSITION   Final discharge disposition not confirmed  Code Status at Discharge: FULL  Discharging to: IPR @ [facility]  Discharge Diet: Regular Thin Liquids.   Outpatient Follow-Up PCP Pulmonary    DETAILS OF HOSPITAL STAY   History of Present Illness:   Katie Knight is a 48 y.o. female  that has been admitted to [facility] [place]. 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 Code stroke activated with last known normal 10 PM night prior. Stroke code CTA showed large intraparenchymal hemorrhage on the right side with some subdural subarachnoid hemorrhage as well. Neurosurgery and neurology consulted for further management. 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 02/25/2024  TEE was negative for vegetations, patient was extubated. 02/26/2024 patient was reintubated and patient underwent a tracheostomy. 03/02/2024 patient was tolerating trach collar for 12 hours.   03/03/2024 patient continued to spike fevers, continued with copious tracheal secretions, patient continued with tachycardia.  Patient was started on Unasyn  with sputum cultures positive for rest Staphylococcus aureus.  03/04/2024 patient tolerating spontaneous breathing trials, was  deemed stable to transfer to [facility].   Hospital Course:   Acute hypoxic respiratory Failure:  Trach placed 02/26/2024 at OSH. Liberated off the vent and now  Capped On 1 L [place]. Pulmonary and RT followed.    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 Avoid antiplatelets and anticoagulants.  Patient continues to complain of headache.  Repeat CT head 03/15/2024  with no acute intracranial abnormality.  On acetaminophen  PRN.    Interstitial lung disease:  Treated with CellCept as of April 2025, home CellCept remains on hold at the moment.  Patient to follow up with Pulmonary and  PCP upon discharge.   Pneumonia:  Sputum culture obtained 03/03/2024 at outside facility grew E coli, Klebsiella pneumoniae, and Staphylococcus aureus.  Completed Bactrim on  03/16/2024. Patient has remained afebrile.    Hypertension:  Blood pressure 105/49-144/85.  D/cd amlodipine  10 mg daily and started diltiazem per cardiology recommendation. Continue carvedilol  12.5 mg b.i.d.  Monitored blood pressures closely.    Anemia of chronic Illness: Continue to monitor H/H. Most recent Hgb 8.7 .    Generalized weakness/ Myopathy of Critical Illness:  PT/OT eval and treat.    Nutrition:  Passed MBS 03/19/2024, Started on Regular diet. Tolerating. D/cd tube feeds and NGT removed. Registered dietitian/ST Following.    Hyponatremia Resolved:  Latest sodium 137.  gave Na Cl tablets t.i.d. X 2 days for low Na.     Prophylaxis:  Continue on famotidine  20 mg b.i.d. for GI prophylaxis, Added Probiotics & fiber due to loose stools with negative C-diff.  DVT prophylaxis: place on SCD due to decrease Hgb, scant tracheal bleed upon arrival to  [facility] & Hx of temporal hemorrhage.        Medication List     START taking these medications      Prescription Last Dose and Time given  acetaminophen  325 MG  tablet Commonly known as: TYLENOL   Take 2 tablets (650 mg total) by mouth every 6 (six) hours as needed for mild pain, Temp > or equal to 101F (38.3C) or moderate pain. Refill: 0  650 mg on March 20, 2024  8:13 AM   Acidophilus Lactobacillus capsule  1 each by PO/Per Tube route in the morning and 1 each before bedtime. Refill: 0  1 each on March 21, 2024  9:56 AM   carvedilol  12.5 MG tablet Commonly known as: COREG   1 tablet (12.5 mg total) by PO/Per Tube route 2 (two) times a day with Breakfast and Dinner. Refill: 0  12.5 mg on March 21, 2024  8:34 AM   dilTIAZem 30 MG tablet Commonly known as: CARDIZEM  1 tablet (30 mg total) by PO/Per Tube route every 12 (twelve) hours. Refill: 0  30 mg on March 21, 2024  9:56 AM   famotidine  20 MG tablet Commonly known as: PEPCID   1 tablet (20 mg total) by PO/Per Tube route in the morning and 1 tablet (20 mg total) before bedtime. Refill: 0  20 mg on March 21, 2024  9:56 AM   Tab-A-Vite/Beta Carotene tablet  1 tablet by PO/Per Tube route in the morning. Refill: 0 Start Taking: March 22, 2024  1 tablet on March 21, 2024  9:56 AM         Consults: rehabilitation medicine  Physical Exam at Discharge Discharge Condition: good Pulse: 80 Resp: 18 BP: 114/69 Temp: 97.7 F (36.5 C) Weight: 267 lb (121.1 kg)   General:  Patient is in no apparent distress.   HEENT:  Normocephalic atraumatic, PERRL, EOMs intact, nares  patent, oropharynx is clear.  Neck : Supple, Tracheostomy midline capped, no noted JVD.  Cardiovascular : S1/S2 WNL, No murmurs rubs or gallops, SR on tele.  Lungs: CTA.  Abdomen: + BS X4 Quads, soft, NT, Distended.  Extremities: no clubbing cyanosis or edema  Integumentary: Warm & dry.  Neuro:  Right head craniotomy scar healed, Left-sided weakness.  Alert and oriented.     >31 minutes spent in discharge coordination.    SIGNATURE   TANGHAM-TATA, HILDA A, NP 03/21/24 11:00 AM EDT

## 2024-03-24 NOTE — Unmapped External Note (Signed)
 Physical Therapy         Discharge Summary  Patient Name: Katie Knight Patient Birthdate: February 04, 1976  Patient Status:    Patient Status:  Discharge to Home/IRF/SNF  PT CURRENT FUNCTIONAL STATUS:  PT Current Functional Status:    PT Current Functional Status:  Pt is 48 year old female who fully participated in PT. Pt progressed with PT with improved strength, coordination and balance. Pt able to participate with transfers requiring increase assistance. Pt will benefit from IPR to progress deficits to return to PLOF  Factors in Goal Achievement: Facilitating Factors:  Patient compliance, Patient motivation, Support of family or caregiver, Improved functional mobility, Improved strength and Improved balance Barriers:  Balance deficits and Strength limitations   Patient needs assistance with the following activities:  Balance, Sitting balance and Walking and/or mobility         CARE Scores Key:  6: Independent. Helper provides no assistance with tasks. A device may or may not have been used. 5: Set-up or clean-up assistance. Helper sets up or cleans up, but does not assist with tasks. Helper may have assisted prior to or following the activity. 4: Supervision or touching assistance. Helper provides verbal cues or touching/steadying or contact guard assistance. Assistance may be provided throughout the activity or intermittently. 3: Partial/moderate assistance. Helper does less than half the effort. Helper lifts, holds, or supports trunk or limbs, but provides less than half the effort. 2: Substantial/maximal assistance. Helper does more than half the effort. Helper lifts or holds trunk or limbs, and provides more than half the effort. 1: Dependent. Helper does all of the effort, or the assistance of two or more helpers is required for the patient to complete the activity. -: Inconsistent or incomplete documentation  Activity not attempted  values: 7: Patient refused 9: Not applicable - Not attempted and the patient did not perform this activity prior to the current illness, exacerbation, or injury. 10: Not attempted due to environmental limitations (e.g., lack of equipment, weather constraints) 88: Not attempted due to medical condition or safety concerns   Goals: Goal Goal Status Discharge Status  Sit to Lying LTG: Independent Not Achieved   Sit to Lying - CARE Score: 2 (03/20/24 1200)  Lying to Sitting on Side of Bed LTG: Independent Not Achieved   Lying to Sitting on Side of Bed - CARE Score: 2 (03/20/24 1200)  Sit to Stand LTG: Partial/moderate assistance Not Achieved   Sit to Stand - CARE Score: 2 (03/20/24 1200)  Chair/Bed-to-Chair Transfer LTG: Partial/moderate assistance Not Achieved   Chair/Bed-to-Chair Transfer - CARE Score: 1 (03/20/24 1200)  Walk 10 Feet LTG: Partial/moderate assistance Not Achieved Walk 10 Feet - CARE Score: 88 (03/20/24 1200)     Walking 10 Feet on Uneven Surfaces - CARE Score: 10 (03/20/24 1200)     Walk 50 Feet with Two Turns - CARE Score: 88 (03/20/24 1200)     Walk 150 Feet - CARE Score: 88 (03/20/24 1200)     1 Step (Curb) - CARE Score: 88 (03/20/24 1200)     4 Steps - CARE Score: 88 (03/20/24 1200)     12 Steps - CARE Score: 88 (03/20/24 1200)     Wheel 50 Feet with Two Turns - CARE Score: 88 (03/20/24 1200)     Wheel 150 Feet - CARE Score: 88 (03/20/24 1200)   Other Goals: Goal Goal Status Discharge Status  Goal Status - Patient will demonstrate sitting balance: Touching/contact guard Not Achieved   Current  Status: Substantial/maximal assistance (03/05/24 9073)                                             Additional Goals: N/A      Discharge Instructions given to patient:     CLOVIS HARLENE HERO., PT 03/24/2024

## 2024-03-26 LAB — FUNGUS CULTURE WITH STAIN

## 2024-03-26 LAB — FUNGUS CULTURE RESULT

## 2024-03-26 LAB — FUNGAL ORGANISM REFLEX

## 2024-04-14 NOTE — Progress Notes (Unsigned)
 Office Visit Note  Patient: Katie Knight             Date of Birth: 02-May-1976           MRN: [id number]             PCP: Delores Corean Pollen, MD Referring: Larnell Hamilton, MD Visit Date: 04/15/2024 Occupation: Data Unavailable  Subjective:  No chief complaint on file.   History of Present Illness: Katie Knight is a 48 y.o. female ***     Activities of Daily Living:  Patient reports morning stiffness for *** {minute/hour:19697}.   Patient {ACTIONS;DENIES/REPORTS:21021675::Denies} nocturnal pain.  Difficulty dressing/grooming: {ACTIONS;DENIES/REPORTS:21021675::Denies} Difficulty climbing stairs: {ACTIONS;DENIES/REPORTS:21021675::Denies} Difficulty getting out of chair: {ACTIONS;DENIES/REPORTS:21021675::Denies} Difficulty using hands for taps, buttons, cutlery, and/or writing: {ACTIONS;DENIES/REPORTS:21021675::Denies}  No Rheumatology ROS completed.   PMFS History:  Patient Active Problem List   Diagnosis Date Noted   Acute respiratory failure with hypoxia (HCC) 02/26/2024   SIRS (systemic inflammatory response syndrome) (HCC) 02/25/2024   Intraparenchymal hemorrhage of brain (HCC) 02/15/2024   Severe persistent asthma, uncomplicated 11/29/2023   ILD (interstitial lung disease) (HCC) 07/26/2023   Prediabetes 05/21/2023   Other fatigue 05/07/2023   SOBOE (shortness of breath on exertion) 05/07/2023   Vitamin D  deficiency 05/07/2023   Snoring 05/07/2023   Depression screen 05/07/2023   BMI 40.0-44.9, adult (HCC) 05/07/2023   Morbid obesity with starting BMI 43.7 05/07/2023   Elevated blood pressure reading 04/23/2023   Chronic pain of both knees 04/23/2023   Asthma 08/27/2022   Chronic cough 08/27/2022   Abnormal CT of the chest 08/27/2022    Past Medical History:  Diagnosis Date   Arthritis    Asthma    High blood pressure    Hyperlipidemia    Joint pain    Osteoporosis    SOB (shortness of breath)    Vitamin D  deficiency     Family  History  Problem Relation Age of Onset   Hypertension Mother    Hypertension Father    Colon cancer Neg Hx    Esophageal cancer Neg Hx    Rectal cancer Neg Hx    Stomach cancer Neg Hx    Past Surgical History:  Procedure Laterality Date   BRONCHIAL BIOPSY  04/30/2022   Procedure: BRONCHIAL BIOPSIES;  Surgeon: Annella Donnice SAUNDERS, MD;  Location: [facility];  Service: Endoscopy;;   BRONCHIAL NEEDLE ASPIRATION BIOPSY  04/30/2022   Procedure: BRONCHIAL NEEDLE ASPIRATION BIOPSIES;  Surgeon: Annella Donnice SAUNDERS, MD;  Location: [facility];  Service: Endoscopy;;   BRONCHIAL WASHINGS  04/30/2022   Procedure: BRONCHIAL WASHINGS;  Surgeon: Annella Donnice SAUNDERS, MD;  Location: [facility];  Service: Endoscopy;;   CRANIOTOMY Right 02/15/2024   Procedure: CRANIOTOMY HEMATOMA EVACUATION SUBDURAL;  Surgeon: Lanis Pupa, MD;  Location: [facility] OR;  Service: Neurosurgery;  Laterality: Right;   DILATION AND CURETTAGE OF UTERUS     ENDOBRONCHIAL ULTRASOUND N/A 04/30/2022   Procedure: ENDOBRONCHIAL ULTRASOUND;  Surgeon: Annella Donnice SAUNDERS, MD;  Location: [facility];  Service: Endoscopy;  Laterality: N/A;   INTERCOSTAL NERVE BLOCK Right 07/26/2023   Procedure: INTERCOSTAL NERVE BLOCK;  Surgeon: Kerrin Elspeth BROCKS, MD;  Location: [facility] OR;  Service: Thoracic;  Laterality: Right;   LUNG BIOPSY Right 07/26/2023   Procedure: LUNG BIOPSY;  Surgeon: Kerrin Elspeth BROCKS, MD;  Location: [facility] OR;  Service: Thoracic;  Laterality: Right;   VIDEO BRONCHOSCOPY  04/30/2022   Procedure: VIDEO BRONCHOSCOPY WITHOUT FLUORO;  Surgeon: Annella Donnice SAUNDERS, MD;  Location: [facility];  Service:  Endoscopy;;   Social History   Tobacco Use   Smoking status: Never    Passive exposure: Past   Smokeless tobacco: Never  Vaping Use   Vaping status: Never Used  Substance Use Topics   Alcohol  use: Yes    Comment: rarely   Drug use: Never   Social History   Social History Narrative   Not on file      There is  no immunization history on file for this patient.   Objective: Vital Signs: There were no vitals taken for this visit.   Physical Exam   Musculoskeletal Exam: ***  CDAI Exam: CDAI Score: -- Patient Global: --; Provider Global: -- Swollen: --; Tender: -- Joint Exam 04/15/2024   No joint exam has been documented for this visit   There is currently no information documented on the homunculus. Go to the Rheumatology activity and complete the homunculus joint exam.  Investigation: No additional findings.  Imaging: CT HEAD WO CONTRAST ( ) Result Date: 03/15/2024 EXAM: CT HEAD WITHOUT CONTRAST 03/15/2024 04:38:20 PM TECHNIQUE: CT of the head was performed without the administration of intravenous contrast. Automated exposure control, iterative reconstruction, and/or weight based adjustment of the mA/kV was utilized to reduce the radiation dose to as low as reasonably achievable. COMPARISON: CT head without contrast dated 02/26/2024. CLINICAL HISTORY: Headache. FINDINGS: BRAIN AND VENTRICLES: No acute hemorrhage. No evidence of acute infarct. No hydrocephalus. No extra-axial collection. No mass effect or midline shift. Encephalomalacia is present within the right temporal lobe. Focal dural thickening is again noted subjacent to the craniotomy. ORBITS: No acute abnormality. SINUSES: Fluid is present in the sphenoid sinuses bilaterally. SOFT TISSUES AND SKULL: No acute soft tissue abnormality. No skull fracture. NG tube is in place on the right. IMPRESSION: 1. No acute intracranial abnormality related to the clinical history of headache. 2. Encephalomalacia in the right temporal lobe and focal dural thickening subjacent to the craniotomy, stable compared to prior study. 3. Fluid in the sphenoid sinuses bilaterally. Electronically signed by: Lonni Necessary MD 03/15/2024 04:49 PM EDT RP Workstation: [id number]    Recent Labs: Lab Results  Component Value Date   WBC 7.0 03/16/2024   HGB 8.6  (L) 03/16/2024   PLT 452 (H) 03/16/2024   NA 137 03/19/2024   K 4.6 03/19/2024   CL 98 03/19/2024   CO2 28 03/19/2024   GLUCOSE 113 (H) 03/19/2024   BUN 15 03/19/2024   CREATININE 0.86 03/19/2024   BILITOT 0.2 03/05/2024   ALKPHOS 100 03/05/2024   AST 54 (H) 03/05/2024   ALT 59 (H) 03/05/2024   PROT 7.5 03/05/2024   ALBUMIN 1.9 (L) 03/05/2024   CALCIUM 9.2 03/19/2024   GFRAA >60 02/06/2018   QFTBGOLDPLUS NEGATIVE 08/19/2023    Speciality Comments: No specialty comments available.  Procedures:  No procedures performed Allergies: Patient has no known allergies.   Assessment / Plan:     Visit Diagnoses: No diagnosis found.  Orders: No orders of the defined types were placed in this encounter.  No orders of the defined types were placed in this encounter.   Face-to-face time spent with patient was *** minutes. Greater than 50% of time was spent in counseling and coordination of care.  Follow-Up Instructions: No follow-ups on file.   Shelba SHAUNNA Potters, RT  Note - This record has been created using [employer].  Chart creation errors have been sought, but may not always  have been located. Such creation errors do not reflect on  the standard  of medical care.

## 2024-04-15 ENCOUNTER — Telehealth: Payer: Self-pay

## 2024-04-15 ENCOUNTER — Encounter: Payer: Self-pay | Admitting: Physician Assistant

## 2024-04-15 ENCOUNTER — Ambulatory Visit: Payer: Self-pay | Attending: Physician Assistant | Admitting: Physician Assistant

## 2024-04-15 VITALS — BP 135/85 | HR 78 | Temp 98.5°F | Resp 14 | Ht 64.0 in

## 2024-04-15 DIAGNOSIS — R590 Localized enlarged lymph nodes: Secondary | ICD-10-CM

## 2024-04-15 DIAGNOSIS — E559 Vitamin D deficiency, unspecified: Secondary | ICD-10-CM

## 2024-04-15 DIAGNOSIS — R5383 Other fatigue: Secondary | ICD-10-CM

## 2024-04-15 DIAGNOSIS — J84112 Idiopathic pulmonary fibrosis: Secondary | ICD-10-CM | POA: Diagnosis not present

## 2024-04-15 DIAGNOSIS — M170 Bilateral primary osteoarthritis of knee: Secondary | ICD-10-CM

## 2024-04-15 DIAGNOSIS — Z79899 Other long term (current) drug therapy: Secondary | ICD-10-CM

## 2024-04-15 DIAGNOSIS — R7689 Other specified abnormal immunological findings in serum: Secondary | ICD-10-CM | POA: Diagnosis not present

## 2024-04-15 DIAGNOSIS — R9389 Abnormal findings on diagnostic imaging of other specified body structures: Secondary | ICD-10-CM

## 2024-04-15 DIAGNOSIS — K581 Irritable bowel syndrome with constipation: Secondary | ICD-10-CM

## 2024-04-15 DIAGNOSIS — Z111 Encounter for screening for respiratory tuberculosis: Secondary | ICD-10-CM

## 2024-04-15 DIAGNOSIS — J4551 Severe persistent asthma with (acute) exacerbation: Secondary | ICD-10-CM

## 2024-04-15 DIAGNOSIS — I619 Nontraumatic intracerebral hemorrhage, unspecified: Secondary | ICD-10-CM

## 2024-04-15 NOTE — Telephone Encounter (Signed)
 Per Waddell Craze, please apply for bilateral knee visco injections. Thank you.

## 2024-04-16 NOTE — Telephone Encounter (Signed)
 VOB submitted for Orthovisc, Bilateral knee(s) BV pending

## 2024-04-17 NOTE — Telephone Encounter (Signed)
 Prior authorization required through insurance PA submitted & pending

## 2024-04-20 NOTE — Telephone Encounter (Signed)
 Please call to schedule visco injections.  Approved for Orthovisc, Bilateral knee(s). Buy & Bill No co-pay Deductible has been met $1000 (met $1000) Since the OOP has been met $2500 (met $2500) patient is covered at 100% Authorization #[id number] Authorization dates:  04/17/2024 to 04/16/2025

## 2024-05-13 ENCOUNTER — Ambulatory Visit: Admitting: Neurology

## 2024-05-13 ENCOUNTER — Encounter: Payer: Self-pay | Admitting: Neurology

## 2024-05-13 VITALS — BP 129/76 | HR 89 | Ht 64.0 in | Wt 257.0 lb

## 2024-05-13 DIAGNOSIS — G3184 Mild cognitive impairment, so stated: Secondary | ICD-10-CM

## 2024-05-13 DIAGNOSIS — I6010 Nontraumatic subarachnoid hemorrhage from unspecified middle cerebral artery: Secondary | ICD-10-CM

## 2024-05-13 DIAGNOSIS — I611 Nontraumatic intracerebral hemorrhage in hemisphere, cortical: Secondary | ICD-10-CM | POA: Diagnosis not present

## 2024-05-13 DIAGNOSIS — G4440 Drug-induced headache, not elsewhere classified, not intractable: Secondary | ICD-10-CM

## 2024-05-13 MED ORDER — TOPIRAMATE 25 MG PO TABS: 50.0000 mg | ORAL_TABLET | Freq: Two times a day (BID) | ORAL | 3 refills | Status: AC

## 2024-05-13 NOTE — Progress Notes (Signed)
 [facility] [address] [place]. [place] [address] [phone]       OFFICE FOLLOW-UP NOTE  [name] Date of Birth:  [birth year] Medical Record Number:  [id number]   HPI: [name] is a 48 year old pleasant African-American lady seen today for office follow-up visit following patient stay from 03/21/2024 for 19 days admission for intracerebral hemorrhage in August 2025.  She is accompanied by her wife and daughter.  History is obtained from them and review of electronic medical records and I personally reviewed pertinent available imaging films in PACS.  She has past medical history of interstitial lung disease on CellCept presented on 02/15/2024 with sudden onset of severe headache and she decided to lay down and vomited profusely and developed altered mental status.  Her wife called 911 and she was brought to the ER upon arrival she was able to speak and answer some simple questions but mental status continued to decline.  She was intubated for airway protection and emergent CT head obtained showing large right temporal lobar hematoma with subdural hematoma and subarachnoid hemorrhage.  CT angiogram of the head showed positive spot sign along the right inferior temporal lobe intra-axial hematoma with nonvisualization of the right ICA terminus and right M1 and A1 segments.  These could be occluded or severely spasmed.  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 The aneurysm was clipped with a standard curved titanium clip.  She was kept intubated in the ICU and treated with hypertonic saline.  MRI scan showed a right temporal lobe hematoma with acute infarcts involving insular ribbon on the right as well as right basal ganglia thalamus and posterior limb internal capsule.  2D echo showed EF greater than 75%.  LDL  cholesterol 74 mg percent.   Patient underwent TEE to look for endocarditis but it was normal without evidence of vegetations.  She was extubated but was reintubated the next day due to bronchial mucous plugging and increased work of breathing.  She subsequently underwent tracheostomy as she had a lot of secretions.  She had trouble weaning off ventilatory support hence was transferred to LTAC on 03/04/2024 where she stayed for 2-1/2 weeks was weaned off ventilatory support.  [facility] on 03/21/2024 and she stayed there for 19 days.  She was discharged home on 04/09/2024 and has done well.  She still have some mild weakness of the left hand grip and leg but is able to ambulate short distances by herself with a cane but prefers a walker for long distances.  She has some short-term memory difficulties and occasional word finding difficulties.  She has been complaining of daily headaches which usually are intermittent and start more in the evening.  Headache is mostly in the back but occasionally on the sides.  She describes this as moderate to severe intensity.  She takes 1 tablet of Fioricet as well as Tylenol  every day and occasionally a second tablet.  The provide only part-time relief.  She denies any light or sound sensitivity or significant nausea or vomiting.  She is currently doing physical occupational and speech therapy at home.  Patient is independent in activities for self-care and can walk without any person assistance.  However she cannot left home alone.  She has had outpatient follow-up with her rheumatologist since discharge.  She has  not followed up with neurosurgery yet.  ROS:   14 system review of systems is positive for headache, weakness, imbalance memory difficulties, speech difficulties all other systems negative  PMH:  Past Medical History:  Diagnosis Date   Arthritis    Asthma    Brain aneurysm    High blood pressure     Hyperlipidemia    Joint pain    Osteoporosis    SOB (shortness of breath)    Stroke (HCC)    Vitamin D  deficiency     Social History:  Social History   Socioeconomic History   Marital status: Married    Spouse name: Not on file   Number of children: 3   Years of education: Not on file   Highest education level: Not on file  Occupational History   Occupation: Chief Technology Officer - [employer]  Tobacco Use   Smoking status: Never    Passive exposure: Past   Smokeless tobacco: Never  Vaping Use   Vaping status: Never Used  Substance and Sexual Activity   Alcohol  use: Not Currently    Comment: rarely   Drug use: Never   Sexual activity: Not on file  Other Topics Concern   Not on file  Social History Narrative   Not on file   Social Drivers of Health   Financial Resource Strain: Low Risk  (03/05/2024)   Received from [facility]   Overall Financial Resource Strain (CARDIA)    Difficulty of Paying Living Expenses: Not hard at all  Food Insecurity: No Food Insecurity (03/05/2024)   Received from [facility]   Hunger Vital Sign    Within the past 12 months, you worried that your food would run out before you got the money to buy more.: Never true    Within the past 12 months, the food you bought just didn't last and you didn't have money to get more.: Never true  Transportation Needs: No Transportation Needs (03/23/2024)   Received from [facility]   [facility] SDOH Transportation Source    Has lack of transportation kept you from medical appointments or from getting medications?: No    Has lack of transportation kept you from meetings, work, or from getting things needed for daily living?: No  Physical Activity: Not on file  Stress: No Stress Concern Present (03/21/2024)   Received from [facility]   [employer] of Occupational Health - Occupational Stress Questionnaire    Feeling of Stress : Only a little  Recent Concern: Stress - Stress Concern Present (03/04/2024)    Received from [facility]   [facility] of Occupational Health - Occupational Stress Questionnaire    Feeling of Stress : Rather much  Social Connections: Moderately Isolated (03/05/2024)   Received from [facility]   Social Connection and Isolation Panel    In a typical week, how many times do you talk on the phone with family, friends, or neighbors?: More than three times a week    How often do you get together with friends or relatives?: More than three times a week    How often do you attend church or religious services?: Never    Do you belong to any clubs or organizations such as church groups, unions, fraternal or athletic groups, or school groups?: No    How often do you attend meetings of the clubs or organizations you belong to?: Never    Are you married, widowed, divorced, separated, never married, or living with a partner?: Married  Intimate Partner Violence: Not At Risk (03/04/2024)   Received from [facility]   Domestic Abuse Assessment    Do you feel safe in your relationships at home?: Yes    Physical Abuse: Denies    HRSN Domestic Abuse - Type of Abuse: Not on file    HRSN Domestic Abuse - Time Frame: Not on file    HRSN Domestic Abuse - Signs and Symptoms: Not on file    Verbal Abuse: Denies    HRSN Domestic Abuse - Reported To: Not on file    Medications:   Current Outpatient Medications on File Prior to Visit  Medication Sig Dispense Refill   acetaminophen  (TYLENOL ) 500 MG tablet Take 500-1,000 mg by mouth 2 (two) times daily as needed for moderate pain (pain score 4-6) or headache.     albuterol  (PROVENTIL ) (2.5 MG/3ML) 0.083% nebulizer solution Take 3 mLs (2.5 mg total) by nebulization every 6 (six) hours as needed for wheezing or shortness of breath. 75 mL 12   butalbital-acetaminophen -caffeine (FIORICET) 50-325-40 MG tablet Take 1 tablet by mouth every 6 (six) hours as needed.     CARDIZEM 30 MG tablet take 1 tablet Orally twice a day     carvedilol   (COREG ) 12.5 MG tablet Place 1 tablet (12.5 mg total) into feeding tube 2 (two) times daily with a meal. 60 tablet 0   escitalopram (LEXAPRO) 10 MG tablet Take 10 mg by mouth daily.     traMADol  (ULTRAM ) 50 MG tablet 1 tablet as needed Orally every 8 hrs; Duration: 10 days As needed for pain     zolpidem (AMBIEN) 5 MG tablet Take 5 mg by mouth at bedtime as needed.     No current facility-administered medications on file prior to visit.    Allergies:  No Known Allergies  Physical Exam General: Mildly obese middle-aged African-American lady seated, in no evident distress Head: head normocephalic and atraumatic.  Neck: supple with no carotid or supraclavicular bruits Cardiovascular: regular rate and rhythm, no murmurs Musculoskeletal: no deformity Skin:  no rash/petichiae Vascular:  Normal pulses all extremities Vitals:   05/13/24 1548  BP: 129/76  Pulse: 89   Neurologic Exam Mental Status: Awake and fully alert. Oriented to place and time. Recent and remote memory intact. Attention span, concentration and fund of knowledge appropriate. Mood and affect appropriate.  Diminished recall 2/3.  Able to name only 8 animals which can walk on  4 legs.  Clock drawing 4/4. Cranial Nerves: Fundoscopic exam reveals sharp disc margins. Pupils equal, briskly reactive to light. Extraocular movements full without nystagmus. Visual fields full to confrontation. Hearing intact. Facial sensation intact.  Mild left lower facial asymmetry.  Tongue, palate moves normally and symmetrically.  Motor: Normal bulk and tone. Normal strength in all tested extremity muscles.  Mild weakness of left grip and intrinsic hand muscles.  Orbits right over left upper extremity.  Mild weakness of left hip flexors and ankle dorsiflexors.  Tone is slightly increased on the left compared to the right. Sensory.: intact to touch ,pinprick .position and vibratory sensation.  Coordination: Rapid alternating movements normal in all  extremities. Finger-to-nose and heel-to-shin performed accurately bilaterally. Gait and Station: Arises from chair without difficulty. Stance is normal. Gait demonstrates slight dragging of the right leg with mild left foot drop and is broad-based.  Uses a wheeled walker..  Tandem walking not attempted. Reflexes: 2+ and asymmetric and brisker on the left. Toes downgoing.   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  She has done reasonably well mild residual left hemiparesis and cognitive impairment as well as chronic daily headaches.  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  She is doing reasonably well with mild left hemiparesis and cognitive impairment but also has chronic daily headaches which likely represent analgesic rebound headaches.  I recommend she discontinue Tylenol  and Fioricet give us  self and holiday from these due to rebound effect.  Trial of Topamax 25 mg twice daily for a week to be increased to 50 mg twice daily as tolerated.  Check follow-up CT angiogram to look for aneurysm coiling adequacy.  Continue ongoing speech, physical and Occupational Therapy.  She was encouraged to use a walker while walking outdoors and long distances.  I encouraged her to increase participation in cognitively challenging activities like solving crossword puzzles, playing bridge and sudoku.  We also discussed memory compensation strategies.  Return for follow-up in the future in 3 months with my nurse practitioner call earlier if  necessary.    I personally spent a total of 40 minutes in the care of the patient today including getting/reviewing separately obtained history, performing a medically appropriate exam/evaluation, counseling and educating, placing orders, referring and communicating with other health care professionals, documenting clinical information in the EHR, independently interpreting results, and coordinating care.     Eather Popp, MD   Note: This document was prepared with digital dictation and possible smart phrase technology. Any transcriptional errors that result from this process are unintentional

## 2024-05-13 NOTE — Patient Instructions (Signed)
 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  She is doing reasonably well with mild left hemiparesis and cognitive impairment but also has chronic daily headaches which likely represent analgesic rebound headaches.  I recommend she discontinue Tylenol  and Fioricet give us  self and holiday from these due to rebound effect.  Trial of Topamax 25 mg twice daily for a week to be increased to 50 mg twice daily as tolerated.  Check follow-up CT angiogram to look for aneurysm coiling adequacy.  Continue ongoing speech, physical and Occupational Therapy.  She was encouraged to use a walker while walking outdoors and long distances.  I encouraged her to increase participation in cognitively challenging activities like solving crossword puzzles, playing bridge and sudoku.  We also discussed memory compensation strategies.  Return for follow-up in the future in 3 months with my nurse practitioner call earlier if necessary.  Memory Compensation Strategies  Use WARM strategy.  W= write it down  A= associate it  R= repeat it  M= make a mental note  2.   You can keep a Glass Blower/designer.  Use a 3-ring notebook with sections for the following: calendar, important names and phone numbers,  medications, doctors' names/phone numbers, lists/reminders, and a section to journal what you did  each day.   3.    Use a calendar to write appointments down.  4.    Write yourself a schedule for the day.  This can be placed on the calendar or in a separate section of the Memory Notebook.  Keeping a  regular schedule can help memory.  5.    Use medication organizer with sections for each day or morning/evening pills.  You may need help loading it  6.    Keep a basket, or pegboard by the door.  Place items that you need to take out with you in the basket or on the  pegboard.  You may also want to  include a message board for reminders.  7.    Use sticky notes.  Place sticky notes with reminders in a place where the task is performed.  For example:  turn off the  stove placed by the stove, lock the door placed on the door at eye level,  take your medications on  the bathroom mirror or by the place where you normally take your medications.  8.    Use alarms/timers.  Use while cooking to remind yourself to check on food or as a reminder to take your medicine, or as a  reminder to make a call, or as a reminder to perform another task, etc.

## 2024-05-15 ENCOUNTER — Ambulatory Visit
Admission: RE | Admit: 2024-05-15 | Discharge: 2024-05-15 | Disposition: A | Discharge status: Home / Self Care | Source: Ambulatory Visit | Attending: Neurology | Admitting: Neurology

## 2024-05-15 MED ORDER — IOPAMIDOL (ISOVUE-370) INJECTION 76%
75.0000 mL | Freq: Once | INTRAVENOUS | Status: AC | PRN
  Administered 2024-05-15: 75 mL via INTRAVENOUS

## 2024-05-21 ENCOUNTER — Ambulatory Visit: Attending: Physician Assistant | Admitting: Physician Assistant

## 2024-05-21 DIAGNOSIS — M170 Bilateral primary osteoarthritis of knee: Secondary | ICD-10-CM | POA: Diagnosis not present

## 2024-05-21 MED ORDER — HYALURONAN 30 MG/2ML IX SOSY
30.0000 mg | PREFILLED_SYRINGE | INTRA_ARTICULAR | Status: AC | PRN
  Administered 2024-05-21: 30 mg via INTRA_ARTICULAR

## 2024-05-21 MED ORDER — LIDOCAINE HCL 1 % IJ SOLN
1.5000 mL | INTRAMUSCULAR | Status: AC | PRN
  Administered 2024-05-21: 1.5 mL

## 2024-05-21 NOTE — Progress Notes (Signed)
   Procedure Note  Patient: Katie Knight             Date of Birth: [birth year]           MRN: [id number]             Visit Date: 05/21/2024  Procedures: Visit Diagnoses:  1. Primary osteoarthritis of both knees     #1 Orthovisc Bilateral Knees B/B  Large Joint Inj: bilateral knee on 05/21/2024 10:25 AM Indications: pain Details: 25 G 1.5 in needle, medial approach  Arthrogram: No  Medications (Right): 1.5 mL lidocaine  1 %; 30 mg Hyaluronan 30 MG/2ML Aspirate (Right): 0 mL Medications (Left): 1.5 mL lidocaine  1 %; 30 mg Hyaluronan 30 MG/2ML Aspirate (Left): 0 mL Outcome: tolerated well, no immediate complications Procedure, treatment alternatives, risks and benefits explained, specific risks discussed. Consent was given by the patient.       Patient tolerated the procedures well.  Aftercare was discussed.  Waddell Craze, PA-C

## 2024-05-28 ENCOUNTER — Ambulatory Visit: Attending: Physician Assistant | Admitting: Physician Assistant

## 2024-05-28 VITALS — BP 121/84 | HR 70

## 2024-05-28 DIAGNOSIS — M170 Bilateral primary osteoarthritis of knee: Secondary | ICD-10-CM | POA: Diagnosis not present

## 2024-05-28 MED ORDER — HYALURONAN 30 MG/2ML IX SOSY
30.0000 mg | PREFILLED_SYRINGE | INTRA_ARTICULAR | Status: AC | PRN
  Administered 2024-05-28: 30 mg via INTRA_ARTICULAR

## 2024-05-28 MED ORDER — LIDOCAINE HCL 1 % IJ SOLN
1.5000 mL | INTRAMUSCULAR | Status: AC | PRN
  Administered 2024-05-28: 1.5 mL

## 2024-05-28 NOTE — Progress Notes (Signed)
   Procedure Note  Patient: Katie Knight             Date of Birth: Mar 21, 1976           MRN: [id number]             Visit Date: 05/28/2024  Procedures: Visit Diagnoses:  1. Primary osteoarthritis of both knees    Orthovisc #2 bilateral knee injections  Large Joint Inj: bilateral knee on 05/28/2024 1:37 PM Indications: pain Details: 25 G 1.5 in needle, medial approach  Arthrogram: No  Medications (Right): 1.5 mL lidocaine  1 %; 30 mg Hyaluronan 30 MG/2ML Aspirate (Right): 0 mL Medications (Left): 1.5 mL lidocaine  1 %; 30 mg Hyaluronan 30 MG/2ML Aspirate (Left): 0 mL Outcome: tolerated well, no immediate complications Procedure, treatment alternatives, risks and benefits explained, specific risks discussed. Consent was given by the patient. Immediately prior to procedure a time out was called to verify the correct patient, procedure, equipment, support staff and site/side marked as required. Patient was prepped and draped in the usual sterile fashion.     Patient tolerated the procedures well.  Aftercare was discussed.  Waddell Craze, PA-C

## 2024-06-04 ENCOUNTER — Ambulatory Visit: Attending: Physician Assistant | Admitting: Physician Assistant

## 2024-06-04 DIAGNOSIS — M170 Bilateral primary osteoarthritis of knee: Secondary | ICD-10-CM

## 2024-06-04 MED ORDER — HYALURONAN 30 MG/2ML IX SOSY
30.0000 mg | PREFILLED_SYRINGE | INTRA_ARTICULAR | Status: AC | PRN
  Administered 2024-06-04: 30 mg via INTRA_ARTICULAR

## 2024-06-04 MED ORDER — LIDOCAINE HCL 1 % IJ SOLN
1.5000 mL | INTRAMUSCULAR | Status: AC | PRN
  Administered 2024-06-04: 1.5 mL

## 2024-06-04 NOTE — Progress Notes (Signed)
   Procedure Note  Patient: Infinity Jeffords             Date of Birth: Nov 19, 1975           MRN: [id number]             Visit Date: 06/04/2024  Procedures: Visit Diagnoses:  1. Primary osteoarthritis of both knees    #3 Orthovisc Bilateral Knees B/B  Large Joint Inj: bilateral knee on 06/04/2024 2:13 PM Indications: pain Details: 25 G 1.5 in needle, medial approach  Arthrogram: No  Medications (Right): 1.5 mL lidocaine  1 %; 30 mg Hyaluronan 30 MG/2ML Aspirate (Right): 0 mL Medications (Left): 1.5 mL lidocaine  1 %; 30 mg Hyaluronan 30 MG/2ML Aspirate (Left): 0 mL Outcome: tolerated well, no immediate complications Procedure, treatment alternatives, risks and benefits explained, specific risks discussed. Consent was given by the patient.    Patient tolerated the procedures well.  Aftercare was discussed.  Waddell Craze, PA-C

## 2024-06-23 ENCOUNTER — Telehealth: Payer: Self-pay | Admitting: Neurology

## 2024-06-23 NOTE — Telephone Encounter (Signed)
 Late entry from 12:10 pm. I called the [facility] reading room about this. The CT had dropped into the wrong queue accidentally and had not been read yet. They will follow-up on this occurrence and will expedite results.   Update now, I see the CT has been read and is pending to Dr Rosemarie to review.

## 2024-06-23 NOTE — Telephone Encounter (Signed)
 Pt called wanting to know why she has not received a call with her CT results that she did back on 10/31. Please advise.

## 2024-06-25 ENCOUNTER — Ambulatory Visit: Payer: Self-pay | Admitting: Neurology

## 2024-06-25 NOTE — Telephone Encounter (Signed)
 Pt is asking to be called by MD to discuss results of the CT scan

## 2024-07-11 NOTE — Progress Notes (Signed)
 " Subjective Patient ID: Katie Knight is a 48 y.o. female.  Chief Complaint  Patient presents with   Shortness of Breath   Cough    Pt has been short of breath and coughing for 2 days. Does have interstitial lung disease and asthma. Has used her inhaler for the asthma but hasn't helped much.     The following information was reviewed by members of the visit team:  Tobacco  Allergies  Meds  Problems  Med Hx  Surg Hx  Fam Hx  Soc  Hx     Pt is a 48 yo with hx of asthma and other chronic medical problems here for cough, wheezing, chest congestion, URI symptoms, onset 07/07/2024; taking mucinex with no relief; denies CP, N/V/D, stable on exam  Shortness of Breath Associated symptoms include a fever and wheezing.  Cough Associated symptoms include chills, a fever, shortness of breath and wheezing.    Review of Systems  Constitutional:  Positive for chills, fatigue and fever.  HENT:  Positive for congestion.   Respiratory:  Positive for cough, shortness of breath and wheezing.   All other systems reviewed and are negative.   Objective Physical Exam Vitals and nursing note reviewed.  Constitutional:      General: She is not in acute distress.    Appearance: She is obese.  HENT:     Head: Normocephalic.     Right Ear: Tympanic membrane normal.     Left Ear: Tympanic membrane normal.     Nose: Congestion present.     Mouth/Throat:     Mouth: Mucous membranes are moist.     Pharynx: Posterior oropharyngeal erythema present.  Eyes:     Extraocular Movements: Extraocular movements intact.     Pupils: Pupils are equal, round, and reactive to light.  Cardiovascular:     Rate and Rhythm: Regular rhythm. Tachycardia present.     Heart sounds: Normal heart sounds.  Pulmonary:     Effort: Pulmonary effort is normal.     Breath sounds: Normal breath sounds.  Musculoskeletal:     Cervical back: Normal range of motion.  Skin:    General: Skin is warm.     Capillary  Refill: Capillary refill takes less than 2 seconds.  Neurological:     General: No focal deficit present.     Mental Status: She is alert and oriented to person, place, and time.  Psychiatric:        Mood and Affect: Mood normal.        Behavior: Behavior normal.     Assessment/Plan Diagnoses and all orders for this visit:  Influenza A -     predniSONE  (DELTASONE ) 20 mg tablet; Take 2 tablets (40 mg total) by mouth daily for 5 days. -     azithromycin  (ZITHROMAX ) 250 mg tablet; Take 1 tablet (250 mg total) by mouth daily for 5 days. 2 tabs day 1, 1 tab days 2-5  Acute cough -     predniSONE  (DELTASONE ) 20 mg tablet; Take 2 tablets (40 mg total) by mouth daily for 5 days. -     azithromycin  (ZITHROMAX ) 250 mg tablet; Take 1 tablet (250 mg total) by mouth daily for 5 days. 2 tabs day 1, 1 tab days 2-5  Mild intermittent asthma with acute exacerbation (CMD) -     predniSONE  (DELTASONE ) 20 mg tablet; Take 2 tablets (40 mg total) by mouth daily for 5 days. -     azithromycin  (ZITHROMAX ) 250 mg tablet;  Take 1 tablet (250 mg total) by mouth daily for 5 days. 2 tabs day 1, 1 tab days 2-5  Other orders -     carvedilol  (COREG ) 12.5 mg tablet; Take 12.5 mg by mouth in the morning and 12.5 mg in the evening. Take with meals. -     cyclobenzap-irritant cntr irr2 10 mg kit; Take 10 mg by mouth daily. -     Cardizem 30 mg immediate release tablet; Take 30 mg by mouth 2 (two) times a day. -     escitalopram (LEXAPRO) 10 mg tablet; Take 10 mg by mouth daily. -     famotidine  (PEPCID ) 20 mg tablet; Take 20 mg by mouth 2 (two) times a day. -     hydrALAZINE  (APRESOLINE ) 25 mg tablet; 2 (two) times a day as needed.    DDX: Otitis media, influenza, COVID,  streptococcal tonsillitis, bronchitis, sinusitis, viral syndrome, bacterial infection, Pneumonia,   MDM: Pt here for cough, chest congestion, wheezing, + influenza A; given reported symptoms concern for asthma exacerbation as well she states she  has been using her albuterol  more frequently however does not feel this is helping; given this will start pred/azithromycin . Discussed symptomatic treatment as well as follow up, advised to see PCP in 3-4 days for reassessment,  advised on red flags warranting ER evaluation, pt verbalized understanding of all instructions, agreeable to plan of care, stable at departure.  Urgent Care Disposition:  Follow up with PCP    Electronically signed: Rexene Charlies Pouch, NP 07/11/2024  5:58 PM   "

## 2024-07-21 ENCOUNTER — Encounter: Payer: Self-pay | Admitting: Pulmonary Disease

## 2024-07-21 ENCOUNTER — Ambulatory Visit: Admitting: Pulmonary Disease

## 2024-07-21 VITALS — BP 103/71 | HR 79 | Temp 97.8°F | Resp 18 | Ht 64.0 in | Wt 246.0 lb

## 2024-07-21 DIAGNOSIS — J984 Other disorders of lung: Secondary | ICD-10-CM

## 2024-07-21 DIAGNOSIS — I619 Nontraumatic intracerebral hemorrhage, unspecified: Secondary | ICD-10-CM | POA: Diagnosis not present

## 2024-07-21 DIAGNOSIS — R0609 Other forms of dyspnea: Secondary | ICD-10-CM | POA: Diagnosis not present

## 2024-07-21 MED ORDER — IPRATROPIUM-ALBUTEROL 0.5-2.5 (3) MG/3ML IN SOLN: 3.0000 mL | RESPIRATORY_TRACT | 6 refills | Status: AC | PRN

## 2024-07-21 MED ORDER — BREZTRI AEROSPHERE 160-9-4.8 MCG/ACT IN AERO: 2.0000 | INHALATION_SPRAY | Freq: Two times a day (BID) | RESPIRATORY_TRACT | 11 refills | Status: AC

## 2024-07-21 MED ORDER — PREDNISONE 20 MG PO TABS: ORAL_TABLET | ORAL | 0 refills | Status: AC

## 2024-07-21 NOTE — Patient Instructions (Signed)
 Nice to see you again  I refilled Breztri , 2 puffs twice a day every day.  Rinse your mouth out with water  after use.  I sent a prescription for DuoNebs, use with your nebulizer.  Use every 4 hours as needed for cough or shortness of breath.  Take prednisone  as prescribed.  If doing much better next week no need to contact me.  If improving but still lingering I can increase the duration of prednisone  to the lower dose for an additional 5 days  I sent a referral to physical therapy, the speech therapy referral has been received  Return to clinic in 4 months or sooner as needed with Dr. Annella

## 2024-07-21 NOTE — Progress Notes (Addendum)
 "  @Patient  ID: Katie Knight, female    DOB: [birth year], 49 y.o.   MRN: [id number]  Chief Complaint  Patient presents with   Follow-up    Flu- like symptoms SOB    Referring provider: Delores Corean Pollen, MD  HPI:   49 y.o. woman whom we are seeing in follow up for evaluation of cough, dyspnea on exertion, asthma.  Discharge summary from hospital lung biopsy reviewed. Several progress notes and D/C summary reviewed. Several notes from rehab reviewed. Recent ED note reviewed.   Referred to [facility] at time of last visit.  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  She would have improvement in symptoms, decreased cough with prolonged time away from the plant historically.  She was placed on mycophenolate which is a reasonable choice.  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  18-day hospital stay.  Prolonged stay at LTAC.  Received tracheostomy.  Was weaned from the ventilator.  Then went to additional rehab.  Weaned from tracheostomy it appears.  She has left-sided weakness.  Ambulates with a walker.  Weak voice.  She has upcoming speech therapy.  Needs physical therapy as well.  Complete home therapy.  She became ill, more short of breath a couple weeks ago.  Eventually presented to the ED.  Was diagnosed with influenza A.  Treated with prednisone  taper.  Prednisone  has helped some with congestion but still quite congested and short of breath.  Occasional wheeze.  She is using Breztri  and albuterol  as needed.  They seem to help.  HPI at initial visit: Noted shortness of breath for the last several months.  With exertion.  Worse on inclines or stairs.  Not much history at rest.  No time of day when things are better or worse.  No position make things better or worse.  Recent prescribed albuterol  with mild improvement in symptoms.  No other alleviating or  exacerbating factors.  Associated symptoms of cough.  Seems worse in the morning.  Mildly productive.  Not much of an issue today.  Endorses nasal congestion, postnasal drip.  Denies reflux.  Work-up today includes EKG treadmill stress test that was normal 12/2021.  Most recent chest image seen/x-ray 12/27/2021 personally reviewed and interpreted as tenting of the right diaphragm with linear infiltrates most consistent with atelectasis given evidence of volume loss, reviewed reported concern for pneumonia.  Reviewed TTE 2019 that is largely normal.  Reviewed CT chest 02/2018 revealed scattered diffuse peribronchovascular groundglass nodules on my interpretation.  PMH: Asthma Surgical history: D&C Family history: Father with hypertension Social history: Never smoker, lives in [place]   Questionaires / Pulmonary Flowsheets:   ACT:  Asthma Control Test ACT Total Score  03/26/2022  9:55 AM 15  01/19/2022  3:41 PM 18    MMRC:     No data to display          Epworth:      No data to display          Tests:   FENO:  No results found for: NITRICOXIDE  PFT:    Latest Ref Rng & Units 10/19/2022   11:38 AM  PFT Results  FVC-Pre L 2.72   FVC-Predicted Pre % 69   FVC-Post L 2.55   FVC-Predicted Post % 65   Pre FEV1/FVC % % 80   Post FEV1/FCV % % 86   FEV1-Pre L 2.17  FEV1-Predicted Pre % 69   FEV1-Post L 2.19   DLCO uncorrected ml/min/mmHg 17.48   DLCO UNC% % 75   DLCO corrected ml/min/mmHg 17.48   DLCO COR %Predicted % 75   DLVA Predicted % 103   TLC L 4.01   TLC % Predicted % 74   RV % Predicted % 74   Personally reviewed interpreted spirometry suggestive of mild to moderate restriction versus gas trapping.  TLC mildly reduced.  DLCO mildly reduced.  WALK:      No data to display          Imaging: Personally reviewed and as per EMR discussion this note No results found.  Lab Results: Personally reviewed CBC    Component Value  Date/Time   WBC 7.0 03/16/2024 0413   RBC 2.98 (L) 03/16/2024 0413   HGB 8.6 (L) 03/16/2024 0413   HCT 28.0 (L) 03/16/2024 0413   PLT 452 (H) 03/16/2024 0413   MCV 94.0 03/16/2024 0413   MCH 28.9 03/16/2024 0413   MCHC 30.7 03/16/2024 0413   RDW 13.8 03/16/2024 0413   LYMPHSABS 1.4 03/05/2024 0413   MONOABS 0.6 03/05/2024 0413   EOSABS 0.3 03/05/2024 0413   BASOSABS 0.0 03/05/2024 0413    BMET    Component Value Date/Time   NA 137 03/19/2024 0420   NA 137 05/07/2023 0845   K 4.6 03/19/2024 0420   CL 98 03/19/2024 0420   CO2 28 03/19/2024 0420   GLUCOSE 113 (H) 03/19/2024 0420   BUN 15 03/19/2024 0420   BUN 15 05/07/2023 0845   CREATININE 0.86 03/19/2024 0420   CREATININE 0.74 08/19/2023 1015   CALCIUM 9.2 03/19/2024 0420   GFRNONAA >60 03/19/2024 0420   GFRAA >60 02/06/2018 2019    BNP No results found for: BNP  ProBNP No results found for: PROBNP  Specialty Problems       Pulmonary Problems   Asthma   Chronic cough   Snoring   SOBOE (shortness of breath on exertion)   ILD (interstitial lung disease) (HCC)   Severe persistent asthma, uncomplicated (HCC)   Acute respiratory failure with hypoxia (HCC)     Not on File   There is no immunization history on file for this patient.  Past Medical History:  Diagnosis Date   Arthritis    Asthma    Brain aneurysm    High blood pressure    Hyperlipidemia    Joint pain    Osteoporosis    SOB (shortness of breath)    Stroke (HCC)    Vitamin D  deficiency     Tobacco History: Social History   Tobacco Use  Smoking Status Never   Passive exposure: Past  Smokeless Tobacco Never   Counseling given: Not Answered   Continue to not smoke  Outpatient Encounter Medications as of 07/21/2024  Medication Sig   budesonide -glycopyrrolate-formoterol  (BREZTRI  AEROSPHERE) 160-9-4.8 MCG/ACT AERO inhaler Inhale 2 puffs into the lungs in the morning and at bedtime.   ipratropium-albuterol  (DUONEB) 0.5-2.5 (3)  MG/3ML SOLN Take 3 mLs by nebulization every 4 (four) hours as needed.   predniSONE  (DELTASONE ) 20 MG tablet Take 2 tablets (40 mg total) by mouth daily with breakfast for 5 days, THEN 1 tablet (20 mg total) daily with breakfast for 5 days.   acetaminophen  (TYLENOL ) 500 MG tablet Take 500-1,000 mg by mouth 2 (two) times daily as needed for moderate pain (pain score 4-6) or headache.   butalbital-acetaminophen -caffeine (FIORICET) 50-325-40 MG tablet Take 1 tablet by mouth  every 6 (six) hours as needed.   CARDIZEM 30 MG tablet take 1 tablet Orally twice a day   carvedilol  (COREG ) 12.5 MG tablet Place 1 tablet (12.5 mg total) into feeding tube 2 (two) times daily with a meal.   escitalopram (LEXAPRO) 10 MG tablet Take 10 mg by mouth daily.   topiramate  (TOPAMAX ) 25 MG tablet Take 2 tablets (50 mg total) by mouth 2 (two) times daily. Start 1 tablet twice daily x 1 week and then two tablets twice daily   traMADol  (ULTRAM ) 50 MG tablet 1 tablet as needed Orally every 8 hrs; Duration: 10 days As needed for pain   zolpidem (AMBIEN) 5 MG tablet Take 5 mg by mouth at bedtime as needed.   [DISCONTINUED] albuterol  (PROVENTIL ) (2.5 MG/3ML) 0.083% nebulizer solution Take 3 mLs (2.5 mg total) by nebulization every 6 (six) hours as needed for wheezing or shortness of breath.   No facility-administered encounter medications on file as of 07/21/2024.     Review of Systems  Review of Systems  N/a Physical Exam  BP 103/71 (BP Location: Left Arm, Patient Position: Sitting)   Pulse 79   Temp 97.8 F (36.6 C) (Oral)   Resp 18   Ht 5' 4 (1.626 m)   Wt 246 lb (111.6 kg)   SpO2 97%   BMI 42.23 kg/m   Wt Readings from Last 5 Encounters:  07/21/24 246 lb (111.6 kg)  05/13/24 257 lb (116.6 kg)  03/04/24 255 lb 8.2 oz (115.9 kg)  12/14/23 260 lb (117.9 kg)  11/22/23 270 lb (122.5 kg)    BMI Readings from Last 5 Encounters:  07/21/24 42.23 kg/m  05/13/24 44.11 kg/m  04/15/24 43.86 kg/m  03/04/24  40.02 kg/m  12/14/23 44.63 kg/m     Physical Exam General: In chair no distress Eyes: No icterus Pulmonary: Clear, no work of breathing, on room air Cardiovascular: Regular rate and rhythm Abdomen: Distended Neuro: Speech is little bit slowed, slightly dysarthric, mild left-sided weakness   Assessment & Plan:   Dyspnea on exertion: Due to ILD and asthma.  Mild improvement on asthma therapies.  Continue Breztri , continue albuterol  as needed.  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  Based on collaboration with pulmonary at [facility] arrived at alternative diagnosis.  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  Likely contributor.  Scattered endobronchial nodules, groundglass.interlobular septal thickening, no frank fibrosis to my eye.  Negative bronchoscopic biopsy for sarcoidosis.  Placed on mycophenolate but then had subarachnoid hemorrhage presumed to hypertension.  Now stopped.  Considering azathioprine.  Encouraged ongoing follow-up with [facility].  Recent subarachnoid hemorrhage, brain bleed: New referral to physical therapy as outpatient completed LTAC, then rehab, then rehab at home.  SLP appointment pending.  Return in about 4 months (around 11/18/2024).   Katie JONELLE Beals, MD 07/21/2024  I spent 42 minutes in the care of patient including review of records, face-to-face visit, coordination of care.  "

## 2024-08-13 ENCOUNTER — Ambulatory Visit: Attending: Pulmonary Disease | Admitting: Physical Therapy

## 2024-08-13 DIAGNOSIS — I69254 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: Secondary | ICD-10-CM | POA: Diagnosis present

## 2024-08-13 DIAGNOSIS — I619 Nontraumatic intracerebral hemorrhage, unspecified: Secondary | ICD-10-CM | POA: Diagnosis not present

## 2024-08-13 DIAGNOSIS — M6281 Muscle weakness (generalized): Secondary | ICD-10-CM | POA: Insufficient documentation

## 2024-08-13 DIAGNOSIS — R2681 Unsteadiness on feet: Secondary | ICD-10-CM | POA: Insufficient documentation

## 2024-08-13 DIAGNOSIS — R2689 Other abnormalities of gait and mobility: Secondary | ICD-10-CM | POA: Insufficient documentation

## 2024-08-13 NOTE — Therapy (Signed)
 " OUTPATIENT PHYSICAL THERAPY NEURO EVALUATION   Patient Name: Katie Knight MRN: [id number] DOB:[birth year], 49 y.o., female Today's Date: 08/14/2024   PCP: Delores Corean Pollen, MD REFERRING PROVIDER: Annella Donnice SAUNDERS, MD  END OF SESSION:  PT End of Session - 08/14/24 1149     Visit Number 1    Number of Visits 17    Date for Recertification  10/09/24    Authorization Type [employer]    Authorization - Visit Number 1    Authorization - Number of Visits 60    PT Start Time 1150    PT Stop Time 1233    PT Time Calculation (min) 43 min    Activity Tolerance Patient tolerated treatment well    Behavior During Therapy WFL for tasks assessed/performed          Past Medical History:  Diagnosis Date   Arthritis    Asthma    Brain aneurysm    High blood pressure    Hyperlipidemia    Joint pain    Osteoporosis    SOB (shortness of breath)    Stroke (HCC)    Vitamin D  deficiency    Past Surgical History:  Procedure Laterality Date   BRONCHIAL BIOPSY  04/30/2022   Procedure: BRONCHIAL BIOPSIES;  Surgeon: Annella Donnice SAUNDERS, MD;  Location: [facility];  Service: Endoscopy;;   BRONCHIAL NEEDLE ASPIRATION BIOPSY  04/30/2022   Procedure: BRONCHIAL NEEDLE ASPIRATION BIOPSIES;  Surgeon: Annella Donnice SAUNDERS, MD;  Location: [facility];  Service: Endoscopy;;   BRONCHIAL WASHINGS  04/30/2022   Procedure: BRONCHIAL WASHINGS;  Surgeon: Annella Donnice SAUNDERS, MD;  Location: [facility];  Service: Endoscopy;;   CRANIOTOMY Right 02/15/2024   Procedure: CRANIOTOMY HEMATOMA EVACUATION SUBDURAL;  Surgeon: Lanis Pupa, MD;  Location: [facility] OR;  Service: Neurosurgery;  Laterality: Right;   DILATION AND CURETTAGE OF UTERUS     ENDOBRONCHIAL ULTRASOUND N/A 04/30/2022   Procedure: ENDOBRONCHIAL ULTRASOUND;  Surgeon: Annella Donnice SAUNDERS, MD;  Location: [facility];  Service: Endoscopy;  Laterality: N/A;   INTERCOSTAL NERVE BLOCK Right 07/26/2023   Procedure: INTERCOSTAL NERVE  BLOCK;  Surgeon: Kerrin Elspeth BROCKS, MD;  Location: [facility] OR;  Service: Thoracic;  Laterality: Right;   LUNG BIOPSY Right 07/26/2023   Procedure: LUNG BIOPSY;  Surgeon: Kerrin Elspeth BROCKS, MD;  Location: [facility] OR;  Service: Thoracic;  Laterality: Right;   VIDEO BRONCHOSCOPY  04/30/2022   Procedure: VIDEO BRONCHOSCOPY WITHOUT FLUORO;  Surgeon: Annella Donnice SAUNDERS, MD;  Location: [facility];  Service: Endoscopy;;   Patient Active Problem List   Diagnosis Date Noted   Acute respiratory failure with hypoxia (HCC) 02/26/2024   SIRS (systemic inflammatory response syndrome) (HCC) 02/25/2024   Intraparenchymal hemorrhage of brain (HCC) 02/15/2024   Severe persistent asthma, uncomplicated (HCC) 11/29/2023   ILD (interstitial lung disease) (HCC) 07/26/2023   Prediabetes 05/21/2023   Other fatigue 05/07/2023   SOBOE (shortness of breath on exertion) 05/07/2023   Vitamin D  deficiency 05/07/2023   Snoring 05/07/2023   Depression screen 05/07/2023   BMI 40.0-44.9, adult (HCC) 05/07/2023   Morbid obesity with starting BMI 43.7 05/07/2023   Elevated blood pressure reading 04/23/2023   Chronic pain of both knees 04/23/2023   Asthma 08/27/2022   Chronic cough 08/27/2022   Abnormal CT of the chest 08/27/2022    ONSET DATE: 02-15-24  REFERRING DIAG: I61.9 (ICD-10-CM) - Intraparenchymal hemorrhage of brain (HCC)  THERAPY DIAG:  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 (HCC)  Muscle weakness (  generalized)  Other abnormalities of gait and mobility  Unsteadiness on feet  Rationale for Evaluation and Treatment: Rehabilitation  SUBJECTIVE:                                                                                                                                                                                             SUBJECTIVE STATEMENT: Pt presents to PT eval ambulating with 4 wheeled RW, accompanied by her friend;  pt was hospitalized 02-15-24 - 03-04-24 with acute respiratory failure with hypoxia and cortical hemorrhage of brain; admitted to [facility] for LTAC 03-04-24 - 03-21-24;  discharged home with [facility] PT; pt reports she uses the 4 wheeled RW for community ambulation; uses quad cane with step negotiation in her home and sometimes to get to the car.  Pt reports she has made tremendous progress since August.  Pt has ST eval scheduled on 08-31-24 (reports memory problems) and also has eye appt scheduled Pt accompanied by: friend  PERTINENT HISTORY: Per chart note Katie Knight is a 49 y.o. female that has been admitted to [facility]. 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 Code stroke activated with last known normal 10 PM night prior. Stroke code CTA showed large intraparenchymal hemorrhage on the right side with some subdural subarachnoid hemorrhage as well. Neurosurgery and neurology consulted for further management. 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  Acute hypoxic respiratory Failure: Trach placed 02/26/2024 at OSH.  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 No  PRECAUTIONS: Fall; decreased endurance/activity tolerance; Dyspnea on exertion  RED FLAGS: None   WEIGHT BEARING RESTRICTIONS: No  FALLS: Has patient fallen in last 6 months? No  LIVING ENVIRONMENT: Lives with: lives with their family Lives in: House/apartment Stairs: Yes: Internal: 15 steps; on right going up and  External: 2 steps; none Has following equipment at home: Quad cane small base, Environmental Consultant - 4 wheeled, Wheelchair (manual), and Tour manager  PLOF: Independent worked at [employer]   PATIENT GOALS: get stronger  OBJECTIVE:  Note: Objective measures were completed at Evaluation unless otherwise noted.  DIAGNOSTIC FINDINGS: IMPRESSION:  05-13-24 - CT head without contrast  1. 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 2. Only small, faint Right MCA collaterals. But satisfactory right ACA supply via the Acomm. And Right Pcomm / fetal type origin of the Right PCA remain patent and normal. 3. Right MCA region encephalomalacia and surgical aneurysm clipping with no acute intracranial abnormality or enhancing intracranial aneurysm identified. 4. No intracranial atherosclerosis identified.  COGNITION: Overall cognitive status: decreased memory - ST eval scheduled on 08-31-24   SENSATION: WFL  COORDINATION: WFL's RLE: slowed in LLE due to mild weakness   POSTURE: rounded shoulders and forward head  LOWER EXTREMITY ROM:   WFL's bil. LE's   LOWER EXTREMITY MMT:    MMT Right Eval Left Eval  Hip flexion 5 4-  Hip extension    Hip abduction    Hip adduction    Hip internal rotation    Hip external rotation    Knee flexion 4 4-  Knee extension 5 5  Ankle dorsiflexion 5 4-  Ankle plantarflexion 4 2+  Ankle inversion    Ankle eversion    (Blank rows = not tested)  BED MOBILITY:  Findings: Modified independent  - has an adjustable bed  TRANSFERS: Sit to stand: Modified independence  Assistive device utilized: None     Stand to sit: Modified independence  Assistive device utilized: None      RAMP:  Not tested  CURB:  Not tested  STAIRS: Not tested GAIT: Findings: Gait Characteristics: step through pattern, Distance walked: 100', Assistive device utilized:Walker - 4 wheeled, Level of  assistance: SBA, and Comments: CGA without use of RW  FUNCTIONAL TESTS:  5 times sit to stand: 12.53 secs from mat  - no UE support used Timed up and go (TUG): 12.35 secs without use of RW  10 meter walk test: 15.72 secs with RW = 2.09 ft/sec with RW     08/13/24 0001  Berg Balance Test  Sit to Stand 4  Standing Unsupported 4 (c/o moderately fatigued)  Sitting with Back Unsupported but Feet Supported on Floor or Stool 4  Stand to Sit 4  Transfers 4  Standing Unsupported with Eyes Closed 4  Standing Unsupported with Feet Together 3  From Standing, Reach Forward with Outstretched Arm 4  From Standing Position, Pick up Object from Floor 4  From Standing Position, Turn to Look Behind Over each Shoulder 4  Turn 360 Degrees 2 (R= 5.22,  L= 4.34)  Standing Unsupported, Alternately Place Feet on Step/Stool 1  Standing Unsupported, One Foot in Front 3  Standing on One Leg 1 (approx. 2 secs)  Total Score 46                                                                                                                               TREATMENT DATE: 08-13-24  Discussed POC with recommended frequency of 2x/week; recommended pt begin walking in home with use of RW as much  as possible and also to practice standing on each leg (SLS) for 10 secs with UE support on object/counter as needed for assist with balance and safety  PATIENT EDUCATION: Education details: see above Person educated: Patient Education method: Explanation Education comprehension: verbalized understanding  HOME EXERCISE PROGRAM: To be established  GOALS: Goals reviewed with patient? Yes  SHORT TERM GOALS: Target date: 09-11-24  Improve Berg score to >/= 50/56 to reduce fall risk and demo improved standing balance. Baseline:  46/50 Goal status: INITIAL  2.  Improve 5x sit to stand score to </= 10 secs to demo increased strength bil. LE's. Baseline: 12.53 secs from mat - no UE support used Goal status: INITIAL  3.   Pt will amb. 350' without device on flat, even surface with SBA for increased community accessibility.  Baseline: 100' with RW with SBA Goal status: INITIAL  4.  Increase gait velocity to >/= 2.4 ft/sec with RW for increased gait efficiency. Baseline: 2.09 ft/sec = 15.72 secs Goal status: INITIAL  5.  Negotiate 4 steps with use of Rt hand rail using step over step sequence with supervision. Baseline:  Goal status: INITIAL  6.  Independent in HEP for LLE strengthening and balance exercises. Baseline:  Goal status: INITIAL   LONG TERM GOALS: Target date: 10-09-24  Improve Berg score to >/= 53/56 to reduce fall risk and demo improved standing balance. Baseline: 46/56 on 08-13-24 Goal status: INITIAL  2.  Pt will amb. 500' without device on flat, even surface with SBA for increased community accessibility.  Baseline:  Goal status: INITIAL  3.  Pt will amb. 10 with RW nonstop to demo increased endurance/activity tolerance.  Baseline:  Goal status: INITIAL  4.  Increase gait velocity to >/= 2.8 ft/sec with RW for increased gait efficiency. Baseline: 2.09 ft/sec = 15.72 secs Goal status: INITIAL  5.  Improve FGA score by at least 5 points to demo improved balance and increased safety with gait. Baseline: TBA Goal status: INITIAL  6.  Negotiate 4 steps using step over step sequence without use of handrail to demonstrate increased balance and LLE strength. Baseline:  Goal status: INITIAL  ASSESSMENT:  CLINICAL IMPRESSION: Patient is a 49 y.o. lady who was seen today for physical therapy evaluation and treatment for Lt hemiparesis, gait and balance impairments due to [facility] due to aneurysm sustained on 02-15-24.  Pt's PMH is significant for acute respiratory failure with hypoxia, ILD, SAH s/p craniotomy on 02-15-24, morbid obesity, SIRS, HTN, joint pain and osteoporosis.  Pt is currently using rollator for assistance with ambulation. Pt has decreased endurance/activity tolerance and  decreased high level balance skills.  Pt's TUG score = 12.35 secs without RW and Berg score = 46/56.  Pt has mildly decreased strength in LLE.  Pt reported moderate fatigue at end of evaluation.  Pt will benefit from PT to address gait and balance deficits and LLE weakness.    OBJECTIVE IMPAIRMENTS: cardiopulmonary status limiting activity, decreased activity tolerance, decreased balance, decreased endurance, and difficulty walking.   ACTIVITY LIMITATIONS: carrying, lifting, bending, squatting, stairs, and locomotion level  PARTICIPATION LIMITATIONS: meal prep, cleaning, laundry, driving, shopping, community activity, and occupation  PERSONAL FACTORS: Fitness, Past/current experiences, and 1-2 comorbidities: s/p craniotomy due to [facility] due to aneurysm and ILD and s/p acute respiratory failure with hypoxia are also affecting patient's functional outcome.   REHAB POTENTIAL: Excellent  CLINICAL DECISION MAKING: Evolving/moderate complexity  EVALUATION COMPLEXITY: Moderate  PLAN:  PT FREQUENCY: 2x/week  PT DURATION: 8  weeks + eval  PLANNED INTERVENTIONS: 97110-Therapeutic exercises, 97530- Therapeutic activity, [id number]- Neuromuscular re-education, [phone]- Self Care, [id number]- Gait training, [phone]- Aquatic Therapy, and Patient/Family education  PLAN FOR NEXT SESSION: assess FGA; do 3 walk test with RW:  issue HEP for LLE strengthening and balance;Scifit    Danee Soller, [place], PT 08/14/2024, 11:54 AM        "

## 2024-08-14 ENCOUNTER — Encounter: Payer: Self-pay | Admitting: Physical Therapy

## 2024-08-31 ENCOUNTER — Ambulatory Visit: Admitting: Speech Pathology

## 2024-09-03 ENCOUNTER — Ambulatory Visit: Admitting: Adult Health

## 2024-11-18 ENCOUNTER — Ambulatory Visit: Admitting: Pulmonary Disease

## 2024-12-03 ENCOUNTER — Ambulatory Visit: Admitting: Rheumatology
